# Patient Record
Sex: Female | Born: 1960 | Race: Black or African American | Hispanic: No | Marital: Single | Smoking: Former smoker
Health system: Southern US, Community
[De-identification: ages and names within clinical notes are randomized; demographics above are authoritative.]

## PROBLEM LIST (undated history)

## (undated) DIAGNOSIS — M1990 Unspecified osteoarthritis, unspecified site: Secondary | ICD-10-CM

## (undated) DIAGNOSIS — J40 Bronchitis, not specified as acute or chronic: Secondary | ICD-10-CM

## (undated) DIAGNOSIS — Z923 Personal history of irradiation: Secondary | ICD-10-CM

## (undated) DIAGNOSIS — G43909 Migraine, unspecified, not intractable, without status migrainosus: Secondary | ICD-10-CM

## (undated) DIAGNOSIS — K219 Gastro-esophageal reflux disease without esophagitis: Secondary | ICD-10-CM

## (undated) DIAGNOSIS — J45909 Unspecified asthma, uncomplicated: Secondary | ICD-10-CM

## (undated) DIAGNOSIS — J449 Chronic obstructive pulmonary disease, unspecified: Secondary | ICD-10-CM

## (undated) HISTORY — PX: KNEE ARTHROPLASTY: SHX992

## (undated) HISTORY — PX: CHOLECYSTECTOMY: SHX55

## (undated) HISTORY — PX: APPENDECTOMY: SHX54

## (undated) HISTORY — DX: Bronchitis, not specified as acute or chronic: J40

## (undated) HISTORY — DX: Personal history of irradiation: Z92.3

## (undated) HISTORY — DX: Chronic obstructive pulmonary disease, unspecified: J44.9

---

## 1985-04-14 HISTORY — PX: TUBAL LIGATION: SHX77

## 2001-03-05 ENCOUNTER — Emergency Department (HOSPITAL_COMMUNITY): Admission: EM | Admit: 2001-03-05 | Discharge: 2001-03-05 | Payer: Self-pay | Admitting: *Deleted

## 2003-06-06 ENCOUNTER — Emergency Department (HOSPITAL_COMMUNITY): Admission: EM | Admit: 2003-06-06 | Discharge: 2003-06-06 | Payer: Self-pay | Admitting: *Deleted

## 2003-06-18 ENCOUNTER — Emergency Department (HOSPITAL_COMMUNITY): Admission: EM | Admit: 2003-06-18 | Discharge: 2003-06-18 | Payer: Self-pay | Admitting: Emergency Medicine

## 2005-01-10 ENCOUNTER — Emergency Department (HOSPITAL_COMMUNITY): Admission: EM | Admit: 2005-01-10 | Discharge: 2005-01-11 | Payer: Self-pay | Admitting: Emergency Medicine

## 2005-01-17 ENCOUNTER — Emergency Department (HOSPITAL_COMMUNITY): Admission: EM | Admit: 2005-01-17 | Discharge: 2005-01-17 | Payer: Self-pay | Admitting: Emergency Medicine

## 2007-11-06 ENCOUNTER — Emergency Department (HOSPITAL_COMMUNITY): Admission: EM | Admit: 2007-11-06 | Discharge: 2007-11-06 | Payer: Self-pay | Admitting: Emergency Medicine

## 2008-11-20 ENCOUNTER — Emergency Department (HOSPITAL_COMMUNITY): Admission: EM | Admit: 2008-11-20 | Discharge: 2008-11-20 | Payer: Self-pay | Admitting: Emergency Medicine

## 2009-05-01 ENCOUNTER — Emergency Department (HOSPITAL_COMMUNITY): Admission: EM | Admit: 2009-05-01 | Discharge: 2009-05-01 | Payer: Self-pay | Admitting: Emergency Medicine

## 2009-11-03 ENCOUNTER — Emergency Department (HOSPITAL_COMMUNITY): Admission: EM | Admit: 2009-11-03 | Discharge: 2009-11-03 | Payer: Self-pay | Admitting: Emergency Medicine

## 2010-02-05 ENCOUNTER — Ambulatory Visit (HOSPITAL_COMMUNITY): Admission: RE | Admit: 2010-02-05 | Discharge: 2010-02-05 | Payer: Self-pay | Admitting: Family Medicine

## 2010-09-03 ENCOUNTER — Emergency Department (HOSPITAL_COMMUNITY): Payer: Self-pay

## 2010-09-03 ENCOUNTER — Emergency Department (HOSPITAL_COMMUNITY)
Admission: EM | Admit: 2010-09-03 | Discharge: 2010-09-03 | Disposition: A | Discharge status: Home / Self Care | Payer: Self-pay | Attending: Emergency Medicine | Admitting: Emergency Medicine

## 2010-09-03 DIAGNOSIS — R079 Chest pain, unspecified: Secondary | ICD-10-CM | POA: Insufficient documentation

## 2010-09-03 DIAGNOSIS — J40 Bronchitis, not specified as acute or chronic: Secondary | ICD-10-CM | POA: Insufficient documentation

## 2010-09-03 DIAGNOSIS — J329 Chronic sinusitis, unspecified: Secondary | ICD-10-CM | POA: Insufficient documentation

## 2010-10-14 ENCOUNTER — Emergency Department (HOSPITAL_COMMUNITY)
Admission: EM | Admit: 2010-10-14 | Discharge: 2010-10-14 | Disposition: A | Discharge status: Home / Self Care | Payer: Self-pay | Attending: Emergency Medicine | Admitting: Emergency Medicine

## 2010-10-14 DIAGNOSIS — R300 Dysuria: Secondary | ICD-10-CM | POA: Insufficient documentation

## 2010-10-14 DIAGNOSIS — R110 Nausea: Secondary | ICD-10-CM | POA: Insufficient documentation

## 2010-10-14 DIAGNOSIS — R1032 Left lower quadrant pain: Secondary | ICD-10-CM | POA: Insufficient documentation

## 2010-10-14 DIAGNOSIS — N390 Urinary tract infection, site not specified: Secondary | ICD-10-CM | POA: Insufficient documentation

## 2010-10-14 LAB — BASIC METABOLIC PANEL
BUN: 12 mg/dL (ref 6–23)
CO2: 28 mEq/L (ref 19–32)
Calcium: 9 mg/dL (ref 8.4–10.5)
Chloride: 106 mEq/L (ref 96–112)
Creatinine, Ser: 0.65 mg/dL (ref 0.50–1.10)
GFR calc Af Amer: >60 mL/min (ref >60)
GFR calc non Af Amer: >60 mL/min (ref >60)
Glucose, Bld: 76 mg/dL (ref 70–99)
Potassium: 3.4 mEq/L — ABNORMAL LOW (ref 3.5–5.1)
Sodium: 141 mEq/L (ref 135–145)

## 2010-10-14 LAB — URINALYSIS, ROUTINE W REFLEX MICROSCOPIC
Bilirubin Urine: NEGATIVE
Glucose, UA: NEGATIVE mg/dL
Hgb urine dipstick: NEGATIVE
Ketones, ur: NEGATIVE mg/dL
Nitrite: NEGATIVE
Protein, ur: NEGATIVE mg/dL
Specific Gravity, Urine: >1.03 — ABNORMAL HIGH (ref 1.005–1.030)
Urobilinogen, UA: 0.2 mg/dL (ref 0.0–1.0)
pH: 5.5 (ref 5.0–8.0)

## 2010-10-14 LAB — DIFFERENTIAL
Basophils Absolute: 0 10*3/uL (ref 0.0–0.1)
Basophils Relative: 0 % (ref 0–1)
Eosinophils Absolute: 0.1 10*3/uL (ref 0.0–0.7)
Eosinophils Relative: 1 % (ref 0–5)
Lymphocytes Relative: 30 % (ref 12–46)
Lymphs Abs: 2.9 10*3/uL (ref 0.7–4.0)
Monocytes Absolute: 0.6 10*3/uL (ref 0.1–1.0)
Monocytes Relative: 7 % (ref 3–12)
Neutro Abs: 6.2 10*3/uL (ref 1.7–7.7)
Neutrophils Relative %: 63 % (ref 43–77)

## 2010-10-14 LAB — CBC
HCT: 39 % (ref 36.0–46.0)
Hemoglobin: 13.1 g/dL (ref 12.0–15.0)
MCH: 29.4 pg (ref 26.0–34.0)
MCHC: 33.6 g/dL (ref 30.0–36.0)
MCV: 87.6 fL (ref 78.0–100.0)
Platelets: 210 10*3/uL (ref 150–400)
RBC: 4.45 MIL/uL (ref 3.87–5.11)
RDW: 14.5 % (ref 11.5–15.5)
WBC: 9.9 10*3/uL (ref 4.0–10.5)

## 2010-10-14 LAB — URINE MICROSCOPIC-ADD ON

## 2010-10-16 LAB — URINE CULTURE
Colony Count: 80000
Culture  Setup Time: 201207032015

## 2011-07-12 ENCOUNTER — Emergency Department (HOSPITAL_COMMUNITY)
Admission: EM | Admit: 2011-07-12 | Discharge: 2011-07-12 | Disposition: A | Discharge status: Home / Self Care | Payer: Self-pay | Attending: Emergency Medicine | Admitting: Emergency Medicine

## 2011-07-12 ENCOUNTER — Emergency Department (HOSPITAL_COMMUNITY): Payer: Self-pay

## 2011-07-12 ENCOUNTER — Encounter (HOSPITAL_COMMUNITY): Payer: Self-pay | Admitting: *Deleted

## 2011-07-12 DIAGNOSIS — Z96659 Presence of unspecified artificial knee joint: Secondary | ICD-10-CM | POA: Insufficient documentation

## 2011-07-12 DIAGNOSIS — F17200 Nicotine dependence, unspecified, uncomplicated: Secondary | ICD-10-CM | POA: Insufficient documentation

## 2011-07-12 DIAGNOSIS — M1990 Unspecified osteoarthritis, unspecified site: Secondary | ICD-10-CM

## 2011-07-12 DIAGNOSIS — M1710 Unilateral primary osteoarthritis, unspecified knee: Secondary | ICD-10-CM | POA: Insufficient documentation

## 2011-07-12 DIAGNOSIS — M25569 Pain in unspecified knee: Secondary | ICD-10-CM | POA: Insufficient documentation

## 2011-07-12 MED ORDER — NAPROXEN 500 MG PO TABS: 500.0000 mg | ORAL_TABLET | Freq: Two times a day (BID) | ORAL | Status: AC

## 2011-07-12 MED ORDER — HYDROCODONE-ACETAMINOPHEN 5-500 MG PO TABS: 1.0000 | ORAL_TABLET | Freq: Four times a day (QID) | ORAL | Status: AC | PRN

## 2011-07-12 NOTE — ED Notes (Signed)
Pt c/o pain in bilateral knees and lower back x 3 weeks. Denies injury.

## 2011-07-12 NOTE — Discharge Instructions (Signed)
Osteoarthritis Osteoarthritis is the most common form of arthritis. It is redness, soreness, and swelling (inflammation) affecting the cartilage. Cartilage acts as a cushion, covering the ends of bones where they meet to form a joint. CAUSES  Over time, the cartilage begins to wear away. This causes bone to rub on bone. This produces pain and stiffness in the affected joints. Factors that contribute to this problem are:  Excessive body weight.   Age.   Overuse of joints.  SYMPTOMS   People with osteoarthritis usually experience joint pain, swelling, or stiffness.   Over time, the joint may lose its normal shape.   Small deposits of bone (osteophytes) may grow on the edges of the joint.   Bits of bone or cartilage can break off and float inside the joint space. This may cause more pain and damage.   Osteoarthritis can lead to depression, anxiety, feelings of helplessness, and limitations on daily activities.  The most commonly affected joints are in the:  Ends of the fingers.   Thumbs.   Neck.   Lower back.   Knees.   Hips.  DIAGNOSIS  Diagnosis is mostly based on your symptoms and exam. Tests may be helpful, including:  X-rays of the affected joint.   A computerized magnetic scan (MRI).   Blood tests to rule out other types of arthritis.   Joint fluid tests. This involves using a needle to draw fluid from the joint and examining the fluid under a microscope.  TREATMENT  Goals of treatment are to control pain, improve joint function, maintain a normal body weight, and maintain a healthy lifestyle. Treatment approaches may include:  A prescribed exercise program with rest and joint relief.   Weight control with nutritional education.   Pain relief techniques such as:   Properly applied heat and cold.   Electric pulses delivered to nerve endings under the skin (transcutaneous electrical nerve stimulation, TENS).   Massage.   Certain supplements. Ask your  caregiver before using any supplements, especially in combination with prescribed drugs.   Medicines to control pain, such as:   Acetaminophen.   Nonsteroidal anti-inflammatory drugs (NSAIDs), such as naproxen.   Narcotic or central-acting agents, such as tramadol. This drug carries a risk of addiction and is generally prescribed for short-term use.   Corticosteroids. These can be given orally or as injection. This is a short-term treatment, not recommended for routine use.   Surgery to reposition the bones and relieve pain (osteotomy) or to remove loose pieces of bone and cartilage. Joint replacement may be needed in advanced states of osteoarthritis.  HOME CARE INSTRUCTIONS  Your caregiver can recommend specific types of exercise. These may include:  Strengthening exercises. These are done to strengthen the muscles that support joints affected by arthritis. They can be performed with weights or with exercise bands to add resistance.   Aerobic activities. These are exercises, such as brisk walking or low-impact aerobics, that get your heart pumping. They can help keep your lungs and circulatory system in shape.   Range-of-motion activities. These keep your joints limber.   Balance and agility exercises. These help you maintain daily living skills.  Learning about your condition and being actively involved in your care will help improve the course of your osteoarthritis. SEEK MEDICAL CARE IF:   You feel hot or your skin turns red.   You develop a rash in addition to your joint pain.   You have an oral temperature above 102 F (38.9 C).  FOR   MORE INFORMATION  National Institute of Arthritis and Musculoskeletal and Skin Diseases: www.niams.nih.gov National Institute on Aging: www.nia.nih.gov American College of Rheumatology: www.rheumatology.org Document Released: 03/31/2005 Document Revised: 03/20/2011 Document Reviewed: 07/12/2009 [employer] Patient Information 2012 [employer]. 

## 2011-07-12 NOTE — ED Provider Notes (Signed)
History   This chart was scribed for Geoffery Lyons, MD by Melba Coon. The patient was seen in room APFT23/APFT23 and the patient's care was started at 11:00AM.    CSN: [id number]  Arrival date & time 07/12/11  1041   First MD Initiated Contact with Patient 07/12/11 1055      Chief Complaint  Patient presents with  . Back Pain    (Consider location/radiation/quality/duration/timing/severity/associated sxs/prior treatment) HPI Michele Romero is a 51 y.o. female who presents to the Emergency Department complaining of constant, radiating, moderate to severe bilateral knee pain with an onset 3 weeks ago. Pain radiates to the lower back. Pt has hd chronic left knee problems and had arthroscopic surgery on left knee about 10 years ago, but knee is still giving her trouble. Pt states that the right knee just started to hurt around onset. Knees have been "popping and clicking". No HA, fever, neck pain, CP, SOB, abd pain, or extremity weakness, numbness, or tingling. No known allergies. No other pertinent medical problems.  History reviewed. No pertinent past medical history.  Past Surgical History  Procedure Date  . Knee arthroplasty     left knee  . Tubal ligation   . Cholecystectomy   . Appendectomy     History reviewed. No pertinent family history.  History  Substance Use Topics  . Smoking status: Current Everyday Smoker -- 0.5 packs/day    Types: Cigarettes  . Smokeless tobacco: Not on file  . Alcohol Use: No    OB History    Grav Para Term Preterm Abortions TAB SAB Ect Mult Living                  Review of Systems 10 Systems reviewed and all are negative for acute change except as noted in the HPI.   Allergies  Review of patient's allergies indicates no known allergies.  Home Medications   Current Outpatient Rx  Name Route Sig Dispense Refill  . HYDROCODONE-ACETAMINOPHEN 5-500 MG PO TABS Oral Take 1-2 tablets by mouth every 6 (six) hours as needed for pain.  20 tablet 0  . NAPROXEN 500 MG PO TABS Oral Take 1 tablet (500 mg total) by mouth 2 (two) times daily. 30 tablet 0    BP 126/57  Pulse 85  Temp(Src) 97.8 F (36.6 C) (Oral)  Resp 18  Ht 5' 2.5" (1.588 m)  Wt 234 lb (106.142 kg)  BMI 42.12 kg/m2  SpO2 100%  Physical Exam  Nursing note and vitals reviewed. Constitutional: She is oriented to person, place, and time. She appears well-developed and well-nourished.       Awake, alert, nontoxic appearance.  HENT:  Head: Normocephalic and atraumatic.  Eyes: EOM are normal. Pupils are equal, round, and reactive to light. Right eye exhibits no discharge. Left eye exhibits no discharge.  Neck: Normal range of motion. Neck supple.  Cardiovascular: Normal rate and regular rhythm.   No murmur heard. Pulmonary/Chest: Effort normal. She exhibits no tenderness.  Abdominal: Soft. There is no tenderness. There is no rebound.  Musculoskeletal: She exhibits no edema and no tenderness.       Baseline ROM, no obvious new focal weakness. Right knee appear grossly nml with no effusion with a stable anteior and posterior drawer test; no laxity of the MCL or LCL  Neurological: She is alert and oriented to person, place, and time.       Mental status and motor strength appears baseline for patient and situation.  Skin: Skin  is warm. No rash noted.  Psychiatric: She has a normal mood and affect. Her behavior is normal.    ED Course  Procedures (including critical care time)  DIAGNOSTIC STUDIES: Oxygen Saturation is 100% on room air, normal by my interpretation.    COORDINATION OF CARE:  11:05AM - EDMD will order right knee XR for the pt. 11:50AM - recheck; EDMD has reviewed imaging results c/w osteoarthritis. EDMD advises the pt to f/u with PCP/ED if pain continues; plans for d/c  Labs Reviewed - No data to display Dg Knee Complete 4 Views Right  07/12/2011  *RADIOLOGY REPORT*  Clinical Data: Knee pain and crepitus  RIGHT KNEE - COMPLETE 4+ VIEW   Comparison: None.  Findings: There is medial joint compartment narrowing and subchondral sclerosis.  Patellofemoral joint compartment narrowing is also present.  No subchondral lesions are identified.  There is osteophytosis about the knee, most prominent along the medial joint margin.  There is a moderate sized suprapatellar effusion.  No evidence of fracture or dislocation.  IMPRESSION: There is medial joint compartment and patellofemoral joint compartment narrowing and osteophytosis, consistent with osteoarthritis.  No gross subchondral lesions are identified.  Moderate sized suprapatellar effusion.  Original Report Authenticated By: Brandon Melnick, M.D.     1. Knee pain   2. Osteoarthritis       MDM  Will discharge with nsaids, lortab.  Follow up with ortho.  I personally performed the services described in this documentation, which was scribed in my presence. The recorded information has been reviewed and considered.         Geoffery Lyons, MD 07/12/11 1524

## 2012-03-16 ENCOUNTER — Other Ambulatory Visit (HOSPITAL_COMMUNITY): Payer: Self-pay | Admitting: Nurse Practitioner

## 2012-03-16 DIAGNOSIS — Z139 Encounter for screening, unspecified: Secondary | ICD-10-CM

## 2012-03-25 ENCOUNTER — Ambulatory Visit (HOSPITAL_COMMUNITY)
Admission: RE | Admit: 2012-03-25 | Discharge: 2012-03-25 | Disposition: A | Discharge status: Home / Self Care | Payer: Self-pay | Source: Ambulatory Visit | Attending: Nurse Practitioner | Admitting: Nurse Practitioner

## 2012-03-25 ENCOUNTER — Ambulatory Visit (HOSPITAL_COMMUNITY): Payer: Self-pay

## 2012-03-25 DIAGNOSIS — Z139 Encounter for screening, unspecified: Secondary | ICD-10-CM

## 2012-10-17 ENCOUNTER — Emergency Department (HOSPITAL_COMMUNITY): Payer: Self-pay

## 2012-10-17 ENCOUNTER — Encounter (HOSPITAL_COMMUNITY): Payer: Self-pay | Admitting: Emergency Medicine

## 2012-10-17 ENCOUNTER — Emergency Department (HOSPITAL_COMMUNITY)
Admission: EM | Admit: 2012-10-17 | Discharge: 2012-10-17 | Disposition: A | Discharge status: Home / Self Care | Payer: Self-pay | Attending: Emergency Medicine | Admitting: Emergency Medicine

## 2012-10-17 DIAGNOSIS — F17200 Nicotine dependence, unspecified, uncomplicated: Secondary | ICD-10-CM | POA: Insufficient documentation

## 2012-10-17 DIAGNOSIS — S20229A Contusion of unspecified back wall of thorax, initial encounter: Secondary | ICD-10-CM | POA: Insufficient documentation

## 2012-10-17 DIAGNOSIS — Y929 Unspecified place or not applicable: Secondary | ICD-10-CM | POA: Insufficient documentation

## 2012-10-17 DIAGNOSIS — Y939 Activity, unspecified: Secondary | ICD-10-CM | POA: Insufficient documentation

## 2012-10-17 DIAGNOSIS — W010XXA Fall on same level from slipping, tripping and stumbling without subsequent striking against object, initial encounter: Secondary | ICD-10-CM | POA: Insufficient documentation

## 2012-10-17 DIAGNOSIS — S300XXA Contusion of lower back and pelvis, initial encounter: Secondary | ICD-10-CM

## 2012-10-17 MED ORDER — KETOROLAC TROMETHAMINE 60 MG/2ML IM SOLN
60.0000 mg | Freq: Once | INTRAMUSCULAR | Status: AC
  Administered 2012-10-17: 60 mg via INTRAMUSCULAR
  Filled 2012-10-17: qty 2

## 2012-10-17 MED ORDER — NAPROXEN 500 MG PO TABS: 500.0000 mg | ORAL_TABLET | Freq: Two times a day (BID) | ORAL | Status: DC

## 2012-10-17 MED ORDER — DIAZEPAM 5 MG PO TABS
5.0000 mg | ORAL_TABLET | Freq: Once | ORAL | Status: AC
  Administered 2012-10-17: 5 mg via ORAL
  Filled 2012-10-17: qty 1

## 2012-10-17 MED ORDER — HYDROCODONE-ACETAMINOPHEN 5-325 MG PO TABS: ORAL_TABLET | ORAL | Status: DC

## 2012-10-17 MED ORDER — CYCLOBENZAPRINE HCL 10 MG PO TABS: 10.0000 mg | ORAL_TABLET | Freq: Three times a day (TID) | ORAL | Status: DC | PRN

## 2012-10-17 NOTE — ED Provider Notes (Signed)
History    CSN: [id number] Arrival date & time 10/17/12  1507  First MD Initiated Contact with Patient 10/17/12 1558     Chief Complaint  Patient presents with  . Back Pain   (Consider location/radiation/quality/duration/timing/severity/associated sxs/prior Treatment) Patient is a 52 y.o. female presenting with back pain. The history is provided by the patient.  Back Pain Location:  Lumbar spine Quality:  Aching and shooting Radiates to:  R thigh, R knee and R foot Pain severity:  Moderate Pain is:  Same all the time Onset quality:  Sudden Duration:  2 days Timing:  Constant Progression:  Unchanged Chronicity:  New Context: falling and recent injury   Context: not twisting   Relieved by:  Nothing Worsened by:  Bending, ambulation, twisting, standing and movement Ineffective treatments:  OTC medications Associated symptoms: leg pain   Associated symptoms: no abdominal pain, no abdominal swelling, no bladder incontinence, no bowel incontinence, no chest pain, no dysuria, no fever, no headaches, no numbness, no paresthesias, no pelvic pain, no perianal numbness, no tingling and no weakness    History reviewed. No pertinent past medical history. Past Surgical History  Procedure Laterality Date  . Knee arthroplasty      left knee  . Tubal ligation    . Cholecystectomy    . Appendectomy     History reviewed. No pertinent family history. History  Substance Use Topics  . Smoking status: Current Every Day Smoker -- 0.50 packs/day    Types: Cigarettes  . Smokeless tobacco: Not on file  . Alcohol Use: No   OB History   Grav Para Term Preterm Abortions TAB SAB Ect Mult Living                 Review of Systems  Constitutional: Negative for fever.  Respiratory: Negative for shortness of breath.   Cardiovascular: Negative for chest pain.  Gastrointestinal: Negative for nausea, vomiting, abdominal pain, diarrhea, constipation and bowel incontinence.  Genitourinary:  Negative for bladder incontinence, dysuria, hematuria, flank pain, decreased urine volume, difficulty urinating and pelvic pain.       No perineal numbness or incontinence of urine or feces  Musculoskeletal: Positive for back pain. Negative for joint swelling.  Skin: Negative for rash.  Neurological: Negative for dizziness, tingling, weakness, numbness, headaches and paresthesias.  All other systems reviewed and are negative.    Allergies  Codeine  Home Medications  No current outpatient prescriptions on file. BP 112/70  Pulse 96  Temp(Src) 98.7 F (37.1 C) (Oral)  Resp 20  Ht 5\' 2"  (1.575 m)  Wt 232 lb (105.235 kg)  BMI 42.42 kg/m2  SpO2 100% Physical Exam  Nursing note and vitals reviewed. Constitutional: She is oriented to person, place, and time. She appears well-developed and well-nourished. No distress.  HENT:  Head: Normocephalic and atraumatic.  Neck: Normal range of motion. Neck supple.  Cardiovascular: Normal rate, regular rhythm, normal heart sounds and intact distal pulses.   No murmur heard. Pulmonary/Chest: Effort normal and breath sounds normal. No respiratory distress. She exhibits no tenderness.  Abdominal: Soft. She exhibits no distension. There is no tenderness. There is no rebound and no guarding.  Musculoskeletal: She exhibits tenderness. She exhibits no edema.       Lumbar back: She exhibits tenderness and pain. She exhibits normal range of motion, no swelling, no deformity, no laceration and normal pulse.  ttp of the right lumbar spine and paraspinal muscles.    DP pulses are brisk and symmetrical.  Distal sensation intact.  Hip Flexors/Extensors are intact  Neurological: She is alert and oriented to person, place, and time. No cranial nerve deficit or sensory deficit. She exhibits normal muscle tone. Coordination and gait normal.  Reflex Scores:      Patellar reflexes are 2+ on the right side and 2+ on the left side.      Achilles reflexes are 2+ on the  right side and 2+ on the left side. Skin: Skin is warm and dry.    ED Course  Procedures (including critical care time) Labs Reviewed - No data to display Dg Lumbar Spine Complete  10/17/2012   *RADIOLOGY REPORT*  Clinical Data: Back pain  LUMBAR SPINE - COMPLETE 4+ VIEW  Comparison: None  Findings: There is a mild anterolisthesis of L4 on L5.  Mild multilevel disc space narrowing and ventral endplate spurring is noted.  Facet hypertrophy and degenerative changes noted within the lower lumbar spine.  No fractures or dislocations.  IMPRESSION:  1.  No acute findings. 2.  Lumbar spondylosis.   Original Report Authenticated By: Signa Kell, M.D.     MDM    Patient has ttp of the right lumbar spine and paraspinal muscles.  No focal neuro deficits on exam.  Ambulates with a steady gait.   X-ray negative for acute injury.  No abrasions, edema or bruising to the lumbar region.  Doubt emergent neurological or infectious process.  Pt agrees to ice, heat and close f/u with health dept if needed.    Aris Moman L. Trisha Mangle, PA-C 10/17/12 1716

## 2012-10-17 NOTE — ED Notes (Signed)
Tripped and fell July 4th in tub and c/o lower back pain since. otc meds with no relief. Pain radiates down right leg. Nad.

## 2012-10-17 NOTE — ED Provider Notes (Signed)
Medical screening examination/treatment/procedure(s) were performed by non-physician practitioner and as supervising physician I was immediately available for consultation/collaboration.  Shelda Jakes, MD 10/17/12 [phone]

## 2013-02-18 ENCOUNTER — Other Ambulatory Visit (HOSPITAL_COMMUNITY): Payer: Self-pay | Admitting: *Deleted

## 2013-02-18 DIAGNOSIS — Z139 Encounter for screening, unspecified: Secondary | ICD-10-CM

## 2013-03-28 ENCOUNTER — Ambulatory Visit (HOSPITAL_COMMUNITY): Payer: Self-pay

## 2013-04-18 ENCOUNTER — Ambulatory Visit (HOSPITAL_COMMUNITY)
Admission: RE | Admit: 2013-04-18 | Discharge: 2013-04-18 | Disposition: A | Discharge status: Home / Self Care | Payer: PRIVATE HEALTH INSURANCE | Source: Ambulatory Visit | Attending: *Deleted | Admitting: *Deleted

## 2013-04-18 DIAGNOSIS — Z139 Encounter for screening, unspecified: Secondary | ICD-10-CM

## 2013-04-18 DIAGNOSIS — Z1231 Encounter for screening mammogram for malignant neoplasm of breast: Secondary | ICD-10-CM | POA: Insufficient documentation

## 2013-06-23 ENCOUNTER — Emergency Department (HOSPITAL_COMMUNITY)
Admission: EM | Admit: 2013-06-23 | Discharge: 2013-06-23 | Discharge status: Against Medical Advice | Payer: 59 | Attending: Emergency Medicine | Admitting: Emergency Medicine

## 2013-06-23 ENCOUNTER — Encounter (HOSPITAL_COMMUNITY): Payer: Self-pay | Admitting: Emergency Medicine

## 2013-06-23 DIAGNOSIS — F17200 Nicotine dependence, unspecified, uncomplicated: Secondary | ICD-10-CM | POA: Insufficient documentation

## 2013-06-23 DIAGNOSIS — R109 Unspecified abdominal pain: Secondary | ICD-10-CM | POA: Insufficient documentation

## 2013-06-23 DIAGNOSIS — R197 Diarrhea, unspecified: Secondary | ICD-10-CM | POA: Insufficient documentation

## 2013-06-23 DIAGNOSIS — G971 Other reaction to spinal and lumbar puncture: Secondary | ICD-10-CM | POA: Insufficient documentation

## 2013-06-23 HISTORY — DX: Migraine, unspecified, not intractable, without status migrainosus: G43.909

## 2013-06-23 LAB — COMPREHENSIVE METABOLIC PANEL
ALK PHOS: 74 U/L (ref 39–117)
ALT: 17 U/L (ref 0–35)
AST: 21 U/L (ref 0–37)
Albumin: 3.7 g/dL (ref 3.5–5.2)
BUN: 11 mg/dL (ref 6–23)
CO2: 29 mEq/L (ref 19–32)
CREATININE: 0.77 mg/dL (ref 0.50–1.10)
Calcium: 9.2 mg/dL (ref 8.4–10.5)
Chloride: 103 mEq/L (ref 96–112)
GFR calc non Af Amer: >90 mL/min (ref >90)
Glucose, Bld: 126 mg/dL — ABNORMAL HIGH (ref 70–99)
POTASSIUM: 4.8 meq/L (ref 3.7–5.3)
Sodium: 142 mEq/L (ref 137–147)
Total Bilirubin: 0.2 mg/dL — ABNORMAL LOW (ref 0.3–1.2)
Total Protein: 7.8 g/dL (ref 6.0–8.3)

## 2013-06-23 LAB — CBC WITH DIFFERENTIAL/PLATELET
Basophils Absolute: 0 10*3/uL (ref 0.0–0.1)
Basophils Relative: 0 % (ref 0–1)
Eosinophils Absolute: 0 10*3/uL (ref 0.0–0.7)
Eosinophils Relative: 0 % (ref 0–5)
HCT: 39.7 % (ref 36.0–46.0)
HEMOGLOBIN: 13.5 g/dL (ref 12.0–15.0)
Lymphocytes Relative: 16 % (ref 12–46)
Lymphs Abs: 1.5 10*3/uL (ref 0.7–4.0)
MCH: 30.3 pg (ref 26.0–34.0)
MCHC: 34 g/dL (ref 30.0–36.0)
MCV: 89 fL (ref 78.0–100.0)
Monocytes Absolute: 0.4 10*3/uL (ref 0.1–1.0)
Monocytes Relative: 5 % (ref 3–12)
Neutro Abs: 7.5 10*3/uL (ref 1.7–7.7)
Neutrophils Relative %: 79 % — ABNORMAL HIGH (ref 43–77)
Platelets: 215 10*3/uL (ref 150–400)
RBC: 4.46 MIL/uL (ref 3.87–5.11)
RDW: 14.9 % (ref 11.5–15.5)
WBC: 9.5 10*3/uL (ref 4.0–10.5)

## 2013-06-23 NOTE — ED Notes (Signed)
Patient c/o mid abd pain with nausea and diarrhea. Denies any diarrhea or urinary symptoms. Patient unsure of any fevers but states "I have had a cold rag on my head all day because I felt hot." Patient also c/o migraine headache x4 days. Per patient hx of migraines. Patient reports using Excedrin migraine with no relief. Patient reports some sensitivity to light and sound.

## 2013-06-23 NOTE — ED Notes (Signed)
Called pt in all waiting areas x 3, no answer

## 2013-06-23 NOTE — ED Provider Notes (Signed)
7:25 PM. Patient not in room on attempted evaluation  Ezequiel Essex, MD 06/23/13 1927

## 2013-09-15 ENCOUNTER — Emergency Department (HOSPITAL_COMMUNITY)
Admission: EM | Admit: 2013-09-15 | Discharge: 2013-09-15 | Disposition: A | Discharge status: Home / Self Care | Payer: 59 | Attending: Emergency Medicine | Admitting: Emergency Medicine

## 2013-09-15 ENCOUNTER — Encounter (HOSPITAL_COMMUNITY): Payer: Self-pay | Admitting: Emergency Medicine

## 2013-09-15 DIAGNOSIS — Z8679 Personal history of other diseases of the circulatory system: Secondary | ICD-10-CM | POA: Insufficient documentation

## 2013-09-15 DIAGNOSIS — F17200 Nicotine dependence, unspecified, uncomplicated: Secondary | ICD-10-CM | POA: Insufficient documentation

## 2013-09-15 DIAGNOSIS — G8929 Other chronic pain: Secondary | ICD-10-CM | POA: Insufficient documentation

## 2013-09-15 DIAGNOSIS — Z791 Long term (current) use of non-steroidal anti-inflammatories (NSAID): Secondary | ICD-10-CM | POA: Insufficient documentation

## 2013-09-15 DIAGNOSIS — M25561 Pain in right knee: Secondary | ICD-10-CM

## 2013-09-15 DIAGNOSIS — Z96659 Presence of unspecified artificial knee joint: Secondary | ICD-10-CM | POA: Insufficient documentation

## 2013-09-15 DIAGNOSIS — R52 Pain, unspecified: Secondary | ICD-10-CM | POA: Insufficient documentation

## 2013-09-15 DIAGNOSIS — M25569 Pain in unspecified knee: Secondary | ICD-10-CM | POA: Insufficient documentation

## 2013-09-15 MED ORDER — NAPROXEN 500 MG PO TABS: 500.0000 mg | ORAL_TABLET | Freq: Two times a day (BID) | ORAL | Status: DC

## 2013-09-15 MED ORDER — HYDROCODONE-ACETAMINOPHEN 5-325 MG PO TABS: ORAL_TABLET | ORAL | Status: DC

## 2013-09-15 NOTE — ED Provider Notes (Signed)
CSN: [id number]     Arrival date & time 09/15/13  1348 History   First MD Initiated Contact with Patient 09/15/13 1443     Chief Complaint  Patient presents with  . Leg Pain     (Consider location/radiation/quality/duration/timing/severity/associated sxs/prior Treatment) Patient is a 53 y.o. female presenting with knee pain.  Knee Pain Location:  Knee Injury: no   Knee location:  R knee Pain details:    Quality:  Aching and shooting   Radiates to:  Does not radiate   Severity:  Moderate   Onset quality:  Gradual   Timing:  Intermittent Chronicity:  Chronic Dislocation: no   Foreign body present:  No foreign bodies Prior injury to area:  No (recurrent right knee pain) Relieved by:  Rest Worsened by:  Activity, bearing weight and flexion Ineffective treatments:  Acetaminophen Associated symptoms: no back pain, no decreased ROM, no fatigue, no fever, no itching, no muscle weakness, no neck pain, no numbness, no stiffness, no swelling and no tingling     Past Medical History  Diagnosis Date  . Migraines    Past Surgical History  Procedure Laterality Date  . Knee arthroplasty      left knee  . Tubal ligation    . Cholecystectomy    . Appendectomy     Family History  Problem Relation Age of Onset  . Diabetes Mother   . Hypertension Mother    History  Substance Use Topics  . Smoking status: Current Every Day Smoker -- 0.50 packs/day for 32 years    Types: Cigarettes  . Smokeless tobacco: Never Used  . Alcohol Use: No   OB History   Grav Para Term Preterm Abortions TAB SAB Ect Mult Living   3 2 2  1  1   2      Review of Systems  Constitutional: Negative for fever, chills and fatigue.  Genitourinary: Negative for dysuria and difficulty urinating.  Musculoskeletal: Positive for arthralgias. Negative for back pain, joint swelling, neck pain and stiffness.  Skin: Negative for color change, itching and wound.  All other systems reviewed and are  negative.     Allergies  Codeine  Home Medications   Prior to Admission medications   Medication Sig Start Date End Date Taking? Authorizing Provider  HYDROcodone-acetaminophen (NORCO/VICODIN) 5-325 MG per tablet Take one-two tabs po q 4-6 hrs prn pain 09/15/13   Corrie Brannen L. Starkisha Tullis, PA-C  naproxen (NAPROSYN) 500 MG tablet Take 1 tablet (500 mg total) by mouth 2 (two) times daily. 09/15/13   Emeterio Balke L. Marquita Lias, PA-C   BP 128/83  Pulse 82  Temp(Src) 98.3 F (36.8 C) (Oral)  Resp 16  Ht 5' 2.5" (1.588 m)  Wt 243 lb 6.4 oz (110.406 kg)  BMI 43.78 kg/m2  SpO2 97% Physical Exam  Nursing note and vitals reviewed. Constitutional: She is oriented to person, place, and time. She appears well-developed and well-nourished. No distress.  Cardiovascular: Normal rate, regular rhythm, normal heart sounds and intact distal pulses.   No murmur heard. Pulmonary/Chest: Effort normal and breath sounds normal. She exhibits no tenderness.  Musculoskeletal: She exhibits tenderness.  Diffuse ttp of the anterior and medial right knee.  No erythema, effusion, or step-off deformity.  DP pulse brisk, distal sensation intact. Calf is soft and NT. Compartments of the right leg are soft.   Neurological: She is alert and oriented to person, place, and time. She exhibits normal muscle tone. Coordination normal.  Skin: Skin is warm and dry. No  erythema.    ED Course  Procedures (including critical care time) Labs Review Labs Reviewed - No data to display  Imaging Review No results found.   EKG Interpretation None      MDM   Final diagnoses:  Knee pain, right   Pt well appearing.  Non-toxic.  No concerning sx's for DVT or septic joint.  Compartments of the right LE are soft. Right knee pain is acute on chronic  Pt had imaging of the right knee in March of 2013 that showed OA and compartment narrowing.  Pt has intermittent pain flares of the knee since that time and has not seen orthopedics.  No  concerning sx's for DVT, septic joint or compartment syndrome. I  have advised her of importance of proper f/u and she agrees to plan and verbalized understanding.  She appears stable for d/c, rx's for vicodin and naprosyn.    Maxime Beckner L. Vanessa Reynolds, PA-C 09/16/13 2138

## 2013-09-15 NOTE — Discharge Instructions (Signed)
Knee Pain Knee pain can be a result of an injury or other medical conditions. Treatment will depend on the cause of your pain. HOME CARE  Only take medicine as told by your doctor.  Keep a healthy weight. Being overweight can make the knee hurt more.  Stretch before exercising or playing sports.  If there is constant knee pain, change the way you exercise. Ask your doctor for advice.  Make sure shoes fit well. Choose the right shoe for the sport or activity.  Protect your knees. Wear kneepads if needed.  Rest when you are tired. GET HELP RIGHT AWAY IF:   Your knee pain does not stop.  Your knee pain does not get better.  Your knee joint feels hot to the touch.  You have a fever. MAKE SURE YOU:   Understand these instructions.  Will watch this condition.  Will get help right away if you are not doing well or get worse. Document Released: 06/27/2008 Document Revised: 06/23/2011 Document Reviewed: 06/27/2008 [employer] Patient Information 2014 [employer].  

## 2013-09-15 NOTE — ED Notes (Signed)
Rt leg pain "feels like it will give way"  Limps when walks

## 2013-09-15 NOTE — ED Notes (Signed)
Pt reports for the past 2 weeks has been having pain in r knee and r ankle.  Reports R knee " gives out."  Denies injury.

## 2013-09-17 NOTE — ED Provider Notes (Signed)
Medical screening examination/treatment/procedure(s) were performed by non-physician practitioner and as supervising physician I was immediately available for consultation/collaboration.   EKG Interpretation None       Richarda Blade, MD 09/17/13 [phone]

## 2014-02-13 ENCOUNTER — Encounter (HOSPITAL_COMMUNITY): Payer: Self-pay | Admitting: Emergency Medicine

## 2014-07-04 ENCOUNTER — Other Ambulatory Visit (HOSPITAL_COMMUNITY): Payer: Self-pay | Admitting: *Deleted

## 2014-07-04 DIAGNOSIS — Z1231 Encounter for screening mammogram for malignant neoplasm of breast: Secondary | ICD-10-CM

## 2014-07-17 ENCOUNTER — Ambulatory Visit (HOSPITAL_COMMUNITY)
Admission: RE | Admit: 2014-07-17 | Discharge: 2014-07-17 | Disposition: A | Discharge status: Home / Self Care | Payer: PRIVATE HEALTH INSURANCE | Source: Ambulatory Visit | Attending: *Deleted | Admitting: *Deleted

## 2014-07-17 DIAGNOSIS — Z1231 Encounter for screening mammogram for malignant neoplasm of breast: Secondary | ICD-10-CM | POA: Diagnosis present

## 2014-07-27 ENCOUNTER — Emergency Department (HOSPITAL_COMMUNITY)
Admission: EM | Admit: 2014-07-27 | Discharge: 2014-07-27 | Disposition: A | Discharge status: Home / Self Care | Payer: PRIVATE HEALTH INSURANCE | Attending: Emergency Medicine | Admitting: Emergency Medicine

## 2014-07-27 ENCOUNTER — Encounter (HOSPITAL_COMMUNITY): Payer: Self-pay | Admitting: Emergency Medicine

## 2014-07-27 ENCOUNTER — Emergency Department (HOSPITAL_COMMUNITY): Payer: PRIVATE HEALTH INSURANCE

## 2014-07-27 DIAGNOSIS — Z720 Tobacco use: Secondary | ICD-10-CM | POA: Insufficient documentation

## 2014-07-27 DIAGNOSIS — Z791 Long term (current) use of non-steroidal anti-inflammatories (NSAID): Secondary | ICD-10-CM | POA: Insufficient documentation

## 2014-07-27 DIAGNOSIS — H109 Unspecified conjunctivitis: Secondary | ICD-10-CM | POA: Insufficient documentation

## 2014-07-27 MED ORDER — IBUPROFEN 800 MG PO TABS
800.0000 mg | ORAL_TABLET | Freq: Once | ORAL | Status: AC
  Administered 2014-07-27: 800 mg via ORAL

## 2014-07-27 MED ORDER — IBUPROFEN 800 MG PO TABS
ORAL_TABLET | ORAL | Status: AC
  Filled 2014-07-27: qty 1

## 2014-07-27 NOTE — ED Notes (Signed)
Patient given discharge instruction, verbalized understand. Patient ambulatory out of the department.  

## 2014-07-27 NOTE — ED Notes (Signed)
Right eye very red and sore, MD checking pressure

## 2014-07-27 NOTE — ED Provider Notes (Signed)
CSN: [id number]     Arrival date & time 07/27/14  7858 History  This chart was scribed for Milton Ferguson, MD by Mercy Moore, ED scribe.  This patient was seen in room APA18/[facility]18 and the patient's care was started at 9:21 AM.   Chief Complaint  Patient presents with  . Migraine   Patient is a 54 y.o. female presenting with eye pain. The history is provided by the patient. No language interpreter was used.  Eye Pain This is a new problem. The current episode started more than 2 days ago. The problem has been gradually worsening. Associated symptoms include headaches. Pertinent negatives include no chest pain, no abdominal pain and no shortness of breath. Nothing aggravates the symptoms. Nothing relieves the symptoms. She has tried nothing for the symptoms. The treatment provided no relief.   HPI Comments: Michele Romero is a 54 y.o. female who presents to the Emergency Department complaining of right eye pain and redness, onset three days ago. Patient reports that the following morning she awakened to a right headache and increased redness. Patient reports associated photophobia and exacerbation of pain with flexion of neck and applied pressure; states "it's really sore." Patient denies crusting or discharge.  Patient states that she wears glasses, but her prescription has expired.   Past Medical History  Diagnosis Date  . Migraines    Past Surgical History  Procedure Laterality Date  . Knee arthroplasty      left knee  . Tubal ligation    . Cholecystectomy    . Appendectomy     Family History  Problem Relation Age of Onset  . Diabetes Mother   . Hypertension Mother    History  Substance Use Topics  . Smoking status: Current Every Day Smoker -- 0.50 packs/day for 32 years    Types: Cigarettes  . Smokeless tobacco: Never Used  . Alcohol Use: No   OB History    Gravida Para Term Preterm AB TAB SAB Ectopic Multiple Living   3 2 2  1  1   2      Review of Systems   Constitutional: Negative for appetite change and fatigue.  HENT: Negative for congestion, ear discharge and sinus pressure.   Eyes: Positive for photophobia, pain and redness. Negative for discharge.  Respiratory: Negative for cough and shortness of breath.   Cardiovascular: Negative for chest pain.  Gastrointestinal: Negative for abdominal pain and diarrhea.  Genitourinary: Negative for frequency and hematuria.  Musculoskeletal: Negative for back pain.  Skin: Negative for rash.  Neurological: Positive for headaches. Negative for seizures.  Psychiatric/Behavioral: Negative for hallucinations.      Allergies  Codeine  Home Medications   Prior to Admission medications   Medication Sig Start Date End Date Taking? Authorizing Provider  HYDROcodone-acetaminophen (NORCO/VICODIN) 5-325 MG per tablet Take one-two tabs po q 4-6 hrs prn pain 09/15/13   Tammi Triplett, PA-C  naproxen (NAPROSYN) 500 MG tablet Take 1 tablet (500 mg total) by mouth 2 (two) times daily. 09/15/13   Tammi Triplett, PA-C   Triage Vitals: BP 137/89 mmHg  Pulse 90  Temp(Src) 98.7 F (37.1 C) (Oral)  Resp 16  Ht 5' 2.5" (1.588 m)  Wt 234 lb (106.142 kg)  BMI 42.09 kg/m2  SpO2 99% Physical Exam  Constitutional: She is oriented to person, place, and time. She appears well-developed.  HENT:  Head: Normocephalic.  Eyes: EOM are normal. Pupils are equal, round, and reactive to light. No scleral icterus.  Right conjunctiva  inflamed,  Pressure in right eye 17  Neck: Neck supple. No thyromegaly present.  Cardiovascular: Normal rate and regular rhythm.  Exam reveals no gallop and no friction rub.   No murmur heard. Pulmonary/Chest: No stridor. She has no wheezes. She has no rales. She exhibits no tenderness.  Abdominal: She exhibits no distension. There is no tenderness. There is no rebound.  Musculoskeletal: Normal range of motion. She exhibits no edema.  Lymphadenopathy:    She has no cervical adenopathy.   Neurological: She is oriented to person, place, and time. She exhibits normal muscle tone. Coordination normal.  Skin: No rash noted. No erythema.  Psychiatric: She has a normal mood and affect. Her behavior is normal.    ED Course  Procedures (including critical care time)  COORDINATION OF CARE: 9:28 AM- Discussed treatment plan with patient at bedside and patient agreed to plan.   Labs Review Labs Reviewed - No data to display  Imaging Review No results found.   EKG Interpretation None      MDM   Final diagnoses:  None    Conjunctivitis,  tx with tobrex and follow up tomorrow with eye doctor   Milton Ferguson, MD 07/27/14 1248

## 2014-07-27 NOTE — Discharge Instructions (Signed)
Follow up with Dr. Iona Hansen,  Or follow up with Dr. Creig Hines [phone]  tomorrow

## 2014-07-27 NOTE — ED Notes (Signed)
MD at the bedside  

## 2014-07-27 NOTE — ED Notes (Addendum)
Attempted to complete visual acuity test on patient. Patient states she wears glasses and is unable to see anything on the chart without the use of her glasses except the large E at the top. States the large E on the top is clear with the left eye but blurry with the right eye. Patient does not have her personal glasses to use for visual acuity.

## 2014-07-27 NOTE — ED Notes (Signed)
Pt reports her eye started hurting on Mon, has become red and light sensitive. Pt states the R side of her head hurts.

## 2015-06-21 ENCOUNTER — Encounter: Payer: Self-pay | Admitting: Physician Assistant

## 2015-06-21 ENCOUNTER — Ambulatory Visit: Payer: Self-pay | Admitting: Physician Assistant

## 2015-06-21 VITALS — BP 136/88 | HR 88 | Temp 96.1°F | Ht 61.75 in | Wt 247.5 lb

## 2015-06-21 DIAGNOSIS — M67431 Ganglion, right wrist: Secondary | ICD-10-CM

## 2015-06-21 DIAGNOSIS — M25561 Pain in right knee: Secondary | ICD-10-CM

## 2015-06-21 DIAGNOSIS — Z13220 Encounter for screening for lipoid disorders: Secondary | ICD-10-CM

## 2015-06-21 DIAGNOSIS — Z131 Encounter for screening for diabetes mellitus: Secondary | ICD-10-CM

## 2015-06-21 DIAGNOSIS — Z1239 Encounter for other screening for malignant neoplasm of breast: Secondary | ICD-10-CM

## 2015-06-21 DIAGNOSIS — F17210 Nicotine dependence, cigarettes, uncomplicated: Secondary | ICD-10-CM

## 2015-06-21 LAB — GLUCOSE, POCT (MANUAL RESULT ENTRY): POC Glucose: 83 mg/dl (ref 70–99)

## 2015-06-21 NOTE — Progress Notes (Signed)
BP 136/88 mmHg  Pulse 88  Temp(Src) 96.1 F (35.6 C)  Ht 5' 1.75" (1.568 m)  Wt 247 lb 8 oz (112.265 kg)  BMI 45.66 kg/m2  SpO2 97%   Subjective:    Patient ID: Michele Romero, female    DOB: [birth year], 55 y.o.   MRN: [id number]  HPI: Michele Romero is a 55 y.o. female presenting on 06/21/2015 for New Patient (Initial Visit)   HPI   Pt previously treated at health dept  C/o R knee pain and swelling.  Bother her for over a year.   Xray from 07/12/11 shows OA.    C/o mass R hand since 3 d ago- a litlte sore, not pain.  She doesn't remember bumping it on anything.  C/o nasal congestion x 1 wk.  Some wheezing.    Relevant past medical, surgical, family and social history reviewed and updated as indicated. Interim medical history since our last visit reviewed. Allergies and medications reviewed and updated.  No current outpatient prescriptions on file.   Review of Systems  Constitutional: Positive for diaphoresis. Negative for fever, chills, appetite change, fatigue and unexpected weight change.  HENT: Positive for dental problem, sneezing and sore throat. Negative for congestion, drooling, ear pain, facial swelling, hearing loss, mouth sores, trouble swallowing and voice change.   Eyes: Positive for itching. Negative for pain, discharge, redness and visual disturbance.  Respiratory: Positive for cough and wheezing. Negative for choking and shortness of breath.   Cardiovascular: Positive for leg swelling. Negative for chest pain and palpitations.  Gastrointestinal: Negative for vomiting, abdominal pain, diarrhea, constipation and blood in stool.  Endocrine: Negative for cold intolerance, heat intolerance and polydipsia.  Genitourinary: Negative for dysuria, hematuria and decreased urine volume.  Musculoskeletal: Positive for back pain, arthralgias and gait problem.  Skin: Negative for rash.  Allergic/Immunologic: Positive for environmental allergies.  Neurological:  Negative for seizures, syncope, light-headedness and headaches.  Hematological: Negative for adenopathy.  Psychiatric/Behavioral: Negative for suicidal ideas, dysphoric mood and agitation. The patient is not nervous/anxious.     Per HPI unless specifically indicated above     Objective:    BP 136/88 mmHg  Pulse 88  Temp(Src) 96.1 F (35.6 C)  Ht 5' 1.75" (1.568 m)  Wt 247 lb 8 oz (112.265 kg)  BMI 45.66 kg/m2  SpO2 97%  Wt Readings from Last 3 Encounters:  06/21/15 247 lb 8 oz (112.265 kg)  07/27/14 234 lb (106.142 kg)  09/15/13 243 lb 6.4 oz (110.406 kg)    Physical Exam  Constitutional: She is oriented to person, place, and time. She appears well-developed and well-nourished.  HENT:  Head: Normocephalic and atraumatic.  Mouth/Throat: Oropharynx is clear and moist. No oropharyngeal exudate.  Eyes: Conjunctivae and EOM are normal. Pupils are equal, round, and reactive to light.  Neck: Neck supple. No thyromegaly present.  Cardiovascular: Normal rate and regular rhythm.   Pulmonary/Chest: Effort normal and breath sounds normal.  Abdominal: Soft. Bowel sounds are normal. She exhibits no mass. There is no hepatosplenomegaly. There is no tenderness.  Musculoskeletal: She exhibits no edema.       Right wrist: She exhibits normal range of motion, no tenderness and no bony tenderness.       Right knee: She exhibits no swelling and no effusion. Tenderness found.  Crepitus R knee Small cyst c/w ganglion on dorsal surface R wrist  Lymphadenopathy:    She has no cervical adenopathy.  Neurological: She is alert and oriented to person,  place, and time. Gait normal.  Skin: Skin is warm and dry.  Psychiatric: She has a normal mood and affect. Her behavior is normal.  Vitals reviewed.   Results for orders placed or performed in visit on 06/21/15  POCT Glucose (CBG)  Result Value Ref Range   POC Glucose 83 70 - 99 mg/dl      Assessment & Plan:   Encounter Diagnoses  Name  Primary?  . Right knee pain   . Ganglion cyst of wrist, right   . Cigarette nicotine dependence without complication   . Morbid obesity, unspecified obesity type ([place])   . Screening for diabetes mellitus Yes  . Screening cholesterol level   . Screening for breast cancer     -order mammogram for after April 14 -gave pt [facility] discount application -order xray R knee -get Baseline labs -counseled on smoking cessation -f/u 1 month.

## 2015-06-21 NOTE — Patient Instructions (Signed)
Smoking Cessation, Tips for Success If you are ready to quit smoking, congratulations! You have chosen to help yourself be healthier. Cigarettes bring nicotine, tar, carbon monoxide, and other irritants into your body. Your lungs, heart, and blood vessels will be able to work better without these poisons. There are many different ways to quit smoking. Nicotine gum, nicotine patches, a nicotine inhaler, or nicotine nasal spray can help with physical craving. Hypnosis, support groups, and medicines help break the habit of smoking. WHAT THINGS CAN I DO TO MAKE QUITTING EASIER?  Here are some tips to help you quit for good:  Pick a date when you will quit smoking completely. Tell all of your friends and family about your plan to quit on that date.  Do not try to slowly cut down on the number of cigarettes you are smoking. Pick a quit date and quit smoking completely starting on that day.  Throw away all cigarettes.   Clean and remove all ashtrays from your home, work, and car.  On a card, write down your reasons for quitting. Carry the card with you and read it when you get the urge to smoke.  Cleanse your body of nicotine. Drink enough water and fluids to keep your urine clear or pale yellow. Do this after quitting to flush the nicotine from your body.  Learn to predict your moods. Do not let a bad situation be your excuse to have a cigarette. Some situations in your life might tempt you into wanting a cigarette.  Never have "just one" cigarette. It leads to wanting another and another. Remind yourself of your decision to quit.  Change habits associated with smoking. If you smoked while driving or when feeling stressed, try other activities to replace smoking. Stand up when drinking your coffee. Brush your teeth after eating. Sit in a different chair when you read the paper. Avoid alcohol while trying to quit, and try to drink fewer caffeinated beverages. Alcohol and caffeine may urge you to  smoke.  Avoid foods and drinks that can trigger a desire to smoke, such as sugary or spicy foods and alcohol.  Ask people who smoke not to smoke around you.  Have something planned to do right after eating or having a cup of coffee. For example, plan to take a walk or exercise.  Try a relaxation exercise to calm you down and decrease your stress. Remember, you may be tense and nervous for the first 2 weeks after you quit, but this will pass.  Find new activities to keep your hands busy. Play with a pen, coin, or rubber band. Doodle or draw things on paper.  Brush your teeth right after eating. This will help cut down on the craving for the taste of tobacco after meals. You can also try mouthwash.   Use oral substitutes in place of cigarettes. Try using lemon drops, carrots, cinnamon sticks, or chewing gum. Keep them handy so they are available when you have the urge to smoke.  When you have the urge to smoke, try deep breathing.  Designate your home as a nonsmoking area.  If you are a heavy smoker, ask your health care provider about a prescription for nicotine chewing gum. It can ease your withdrawal from nicotine.  Reward yourself. Set aside the cigarette money you save and buy yourself something nice.  Look for support from others. Join a support group or smoking cessation program. Ask someone at home or at work to help you with your plan   to quit smoking.  Always ask yourself, "Do I need this cigarette or is this just a reflex?" Tell yourself, "Today, I choose not to smoke," or "I do not want to smoke." You are reminding yourself of your decision to quit.  Do not replace cigarette smoking with electronic cigarettes (commonly called e-cigarettes). The safety of e-cigarettes is unknown, and some may contain harmful chemicals.  If you relapse, do not give up! Plan ahead and think about what you will do the next time you get the urge to smoke. HOW WILL I FEEL WHEN I QUIT SMOKING? You  may have symptoms of withdrawal because your body is used to nicotine (the addictive substance in cigarettes). You may crave cigarettes, be irritable, feel very hungry, cough often, get headaches, or have difficulty concentrating. The withdrawal symptoms are only temporary. They are strongest when you first quit but will go away within 10-14 days. When withdrawal symptoms occur, stay in control. Think about your reasons for quitting. Remind yourself that these are signs that your body is healing and getting used to being without cigarettes. Remember that withdrawal symptoms are easier to treat than the major diseases that smoking can cause.  Even after the withdrawal is over, expect periodic urges to smoke. However, these cravings are generally short lived and will go away whether you smoke or not. Do not smoke! WHAT RESOURCES ARE AVAILABLE TO HELP ME QUIT SMOKING? Your health care provider can direct you to community resources or hospitals for support, which may include:  Group support.  Education.  Hypnosis.  Therapy.   This information is not intended to replace advice given to you by your health care provider. Make sure you discuss any questions you have with your health care provider.   Document Released: 12/28/2003 Document Revised: 04/21/2014 Document Reviewed: 09/16/2012 Elsevier Interactive Patient Education 2016 [employer]  

## 2015-06-25 DIAGNOSIS — M67439 Ganglion, unspecified wrist: Secondary | ICD-10-CM | POA: Insufficient documentation

## 2015-06-25 DIAGNOSIS — M25561 Pain in right knee: Secondary | ICD-10-CM | POA: Insufficient documentation

## 2015-06-25 DIAGNOSIS — F17210 Nicotine dependence, cigarettes, uncomplicated: Secondary | ICD-10-CM

## 2015-06-25 HISTORY — DX: Ganglion, unspecified wrist: M67.439

## 2015-06-25 HISTORY — DX: Nicotine dependence, cigarettes, uncomplicated: F17.210

## 2015-06-27 LAB — CBC
HCT: 38 % (ref 36.0–46.0)
Hemoglobin: 12.2 g/dL (ref 12.0–15.0)
MCH: 28.8 pg (ref 26.0–34.0)
MCHC: 32.1 g/dL (ref 30.0–36.0)
MCV: 89.8 fL (ref 78.0–100.0)
MPV: 13.1 fL — ABNORMAL HIGH (ref 8.6–12.4)
PLATELETS: 235 10*3/uL (ref 150–400)
RBC: 4.23 MIL/uL (ref 3.87–5.11)
RDW: 14.8 % (ref 11.5–15.5)
WBC: 6.7 10*3/uL (ref 4.0–10.5)

## 2015-06-27 LAB — COMPLETE METABOLIC PANEL WITH GFR
ALT: 11 U/L (ref 6–29)
AST: 13 U/L (ref 10–35)
Albumin: 3.4 g/dL — ABNORMAL LOW (ref 3.6–5.1)
Alkaline Phosphatase: 63 U/L (ref 33–130)
BUN: 11 mg/dL (ref 7–25)
CO2: 30 mmol/L (ref 20–31)
Calcium: 8.8 mg/dL (ref 8.6–10.4)
Chloride: 105 mmol/L (ref 98–110)
Creat: 0.73 mg/dL (ref 0.50–1.05)
GFR, Est African American: >89 mL/min (ref >=60)
GLUCOSE: 99 mg/dL (ref 65–99)
Potassium: 4.6 mmol/L (ref 3.5–5.3)
SODIUM: 142 mmol/L (ref 135–146)
Total Bilirubin: 0.3 mg/dL (ref 0.2–1.2)
Total Protein: 6.6 g/dL (ref 6.1–8.1)

## 2015-06-27 LAB — TSH: TSH: 1.66 mIU/L

## 2015-06-27 LAB — LIPID PANEL
Cholesterol: 170 mg/dL (ref 125–200)
HDL: 42 mg/dL — ABNORMAL LOW (ref >=46)
LDL CALC: 106 mg/dL (ref <130)
Total CHOL/HDL Ratio: 4 Ratio (ref <=5.0)
Triglycerides: 110 mg/dL (ref <150)
VLDL: 22 mg/dL (ref <30)

## 2015-06-28 LAB — HEMOGLOBIN A1C
Hgb A1c MFr Bld: 5.9 % — ABNORMAL HIGH (ref <5.7)
Mean Plasma Glucose: 123 mg/dL — ABNORMAL HIGH (ref <117)

## 2015-07-09 ENCOUNTER — Emergency Department (HOSPITAL_COMMUNITY): Payer: Self-pay

## 2015-07-09 ENCOUNTER — Emergency Department (HOSPITAL_COMMUNITY)
Admission: EM | Admit: 2015-07-09 | Discharge: 2015-07-09 | Disposition: A | Discharge status: Home / Self Care | Payer: Self-pay | Attending: Emergency Medicine | Admitting: Emergency Medicine

## 2015-07-09 ENCOUNTER — Encounter (HOSPITAL_COMMUNITY): Payer: Self-pay | Admitting: Emergency Medicine

## 2015-07-09 DIAGNOSIS — S93402A Sprain of unspecified ligament of left ankle, initial encounter: Secondary | ICD-10-CM | POA: Insufficient documentation

## 2015-07-09 DIAGNOSIS — S8011XA Contusion of right lower leg, initial encounter: Secondary | ICD-10-CM

## 2015-07-09 DIAGNOSIS — Y929 Unspecified place or not applicable: Secondary | ICD-10-CM | POA: Insufficient documentation

## 2015-07-09 DIAGNOSIS — W109XXA Fall (on) (from) unspecified stairs and steps, initial encounter: Secondary | ICD-10-CM | POA: Insufficient documentation

## 2015-07-09 DIAGNOSIS — S8001XA Contusion of right knee, initial encounter: Secondary | ICD-10-CM | POA: Insufficient documentation

## 2015-07-09 DIAGNOSIS — Y999 Unspecified external cause status: Secondary | ICD-10-CM | POA: Insufficient documentation

## 2015-07-09 DIAGNOSIS — M170 Bilateral primary osteoarthritis of knee: Secondary | ICD-10-CM | POA: Insufficient documentation

## 2015-07-09 DIAGNOSIS — F17210 Nicotine dependence, cigarettes, uncomplicated: Secondary | ICD-10-CM | POA: Insufficient documentation

## 2015-07-09 DIAGNOSIS — Y939 Activity, unspecified: Secondary | ICD-10-CM | POA: Insufficient documentation

## 2015-07-09 MED ORDER — HYDROCODONE-ACETAMINOPHEN 5-325 MG PO TABS: 1.0000 | ORAL_TABLET | ORAL | Status: DC | PRN

## 2015-07-09 MED ORDER — DICLOFENAC SODIUM 75 MG PO TBEC: 75.0000 mg | DELAYED_RELEASE_TABLET | Freq: Two times a day (BID) | ORAL | Status: DC

## 2015-07-09 NOTE — ED Provider Notes (Signed)
CSN: [id number]     Arrival date & time 07/09/15  1130 History  By signing my name below, I, [name], attest that this documentation has been prepared under the direction and in the presence of Lily Kocher, PA-C. Electronically Signed: [name], ED Scribe. 07/09/2015. 12:36 PM.  Chief Complaint  Patient presents with  . Fall   Patient is a 55 y.o. female presenting with fall. The history is provided by the patient. No language interpreter was used.  Fall This is a new problem. The current episode started 3 to 5 hours ago. The problem occurs constantly. The problem has been gradually worsening. Pertinent negatives include no chest pain, no abdominal pain, no headaches and no shortness of breath. Exacerbated by: movement. Nothing relieves the symptoms. She has tried nothing for the symptoms. The treatment provided no relief.   HPI Comments: Michele Romero is a 55 y.o. female who presents to the Emergency Department complaining of sudden onset, constant, moderate pain to multiple areas s/p mechanical fall that occurred 4 hours ago. Pt reports she fell down 4 steps, falling over a grill, and onto the ground after her right knee 'gave out' on her. She is not taking blood thinning medication. No LOC or head injury. She notes left ankle pain, right knee pain, and lower back pain. She was able to present to work this morning following the fall but sought medical evaluation after worsening of the pain. Pt is ambulatory without difficulty.   Past Medical History  Diagnosis Date  . Migraines   . Bronchitis    Past Surgical History  Procedure Laterality Date  . Knee arthroplasty      left knee  . Cholecystectomy    . Appendectomy    . Tubal ligation  1987   Family History  Problem Relation Age of Onset  . Diabetes Mother   . Hypertension Mother   . Heart disease Mother     CHF  . Stroke Mother   . Heart disease Father    Social History  Substance Use Topics  . Smoking  status: Current Every Day Smoker -- 0.25 packs/day for 30 years    Types: Cigarettes  . Smokeless tobacco: Never Used  . Alcohol Use: No   OB History    Gravida Para Term Preterm AB TAB SAB Ectopic Multiple Living   3 2 2  1  1   2      Review of Systems  Respiratory: Negative for shortness of breath.   Cardiovascular: Negative for chest pain.  Gastrointestinal: Negative for abdominal pain.  Musculoskeletal: Positive for back pain ( lower back) and arthralgias ( left ankle, right knee). Negative for gait problem.  Skin: Negative for color change and wound.  Neurological: Negative for syncope and headaches.  Hematological: Does not bruise/bleed easily.  All other systems reviewed and are negative.  Allergies  Codeine  Home Medications   Prior to Admission medications   Not on File   BP 127/78 mmHg  Pulse 79  Temp(Src) 97.9 F (36.6 C) (Oral)  Resp 18  Ht 5\' 2"  (1.575 m)  Wt 247 lb (112.038 kg)  BMI 45.17 kg/m2  SpO2 100% Physical Exam  Constitutional: She is oriented to person, place, and time. She appears well-developed and well-nourished. No distress.  HENT:  Head: Normocephalic.  Eyes: Conjunctivae are normal.  Neck: Normal range of motion. Neck supple.  Cardiovascular: Normal rate.   Pulmonary/Chest: Effort normal. No respiratory distress.  Musculoskeletal: Normal range of motion.  She exhibits tenderness.  Left ankle; pain of the medial malleolus, full ROM of left toes, capillary refill less than 2 seconds, DP 2+, no temperature change of the LLE.   Right lower extremity; degenerative joint disease changes present in right knee, tenderness to medial aspect of knee, no deformity of the quadricept area, patella midline, some swelling of RLE, no pitting edema, DP 2+, capillary refill less than 2 seconds.  Back; tenderness of the left lumbar paraspinal area, no palpable step-off of the lumbar area, tenderness over left mid-lower trapezius area, no palpable step-off of  cervical spine.   Neurological: She is alert and oriented to person, place, and time. Coordination normal.  No motor or sensory deficit of upper or lower extremity.   Skin: Skin is warm.  Psychiatric: She has a normal mood and affect. Her behavior is normal.  Nursing note and vitals reviewed.   ED Course  Procedures  DIAGNOSTIC STUDIES: Oxygen Saturation is 100% on RA, normal by my interpretation.    COORDINATION OF CARE: 12:23 PM Discussed treatment plan with pt at bedside which includes Xray of left ankle and right knee and pt agreed to plan.  Imaging Review Dg Ankle Complete Left  07/09/2015  CLINICAL DATA:  Pain swelling in the left ankle EXAM: LEFT ANKLE COMPLETE - 3+ VIEW COMPARISON:  None. FINDINGS: There is no evidence of fracture, dislocation, or joint effusion. There is no evidence of arthropathy or other focal bone abnormality. Mild soft tissue swelling around the ankle. IMPRESSION: No acute osseous injury of the left ankle. Electronically Signed   By: Kathreen Devoid   On: 07/09/2015 13:07   Dg Knee Complete 4 Views Right  07/09/2015  CLINICAL DATA:  Pain following fall EXAM: RIGHT KNEE - COMPLETE 4+ VIEW COMPARISON:  July 12, 2011 FINDINGS: Frontal, lateral, and bilateral oblique views were obtained. There is no fracture or dislocation. There is no appreciable joint effusion. There is marked narrowing medially and in the patellofemoral joint region. There is spurring in all compartments, progressed on the right and only minimally progressed elsewhere compared to 4 year prior study. No erosive change. IMPRESSION: Extensive osteoarthritic change with overall slight progression compared to 4 years prior. No acute fracture or dislocation. No joint effusion. Electronically Signed   By: Lowella Grip III M.D.   On: 07/09/2015 13:07   I have personally reviewed and evaluated these images results as part of my medical decision-making.   MDM  X-ray of the left ankle is negative for  fracture or dislocation. X-ray of the right knee shows extensive arthritis changes present, but no fracture, no dislocation.  Ankle stirrup splint applied to the left ankle. A prescription for diclofenac and Norco given to the patient. Patient will follow-up with orthopedics if not improving.    Final diagnoses:  None    *I have reviewed nursing notes, vital signs, and all appropriate lab and imaging results for this patient.**  **I personally performed the services described in this documentation, which was scribed in my presence. The recorded information has been reviewed and is accurate.Lily Kocher, PA-C 07/10/15 [place], MD 07/11/15 [phone]

## 2015-07-09 NOTE — ED Notes (Signed)
Patient tripped down approximately 4 steps at her house this AM. C/o right leg pain, left ankle, left shoulder pain. Ambulatory with limp.

## 2015-07-09 NOTE — Discharge Instructions (Signed)
Your x-rays are negative for fracture or dislocation. The x-ray of your knee reveals advanced arthritis. Please discuss this with Dr. Aline Brochure, or the orthopedic specialist of your choice. Please use diclofenac 2 times daily with food. Use Norco every 4 hours, do not take this medicine on an empty stomach. Please use the ankle splint for the next 7-10 days. Ankle Sprain An ankle sprain is an injury to the strong, fibrous tissues (ligaments) that hold your ankle bones together.  HOME CARE   Put ice on your ankle for 1-2 days or as told by your doctor.  Put ice in a plastic bag.  Place a towel between your skin and the bag.  Leave the ice on for 15-20 minutes at a time, every 2 hours while you are awake.  Only take medicine as told by your doctor.  Raise (elevate) your injured ankle above the level of your heart as much as possible for 2-3 days.  Use crutches if your doctor tells you to. Slowly put your own weight on the affected ankle. Use the crutches until you can walk without pain.  If you have a plaster splint:  Do not rest it on anything harder than a pillow for 24 hours.  Do not put weight on it.  Do not get it wet.  Take it off to shower or bathe.  If given, use an elastic wrap or support stocking for support. Take the wrap off if your toes lose feeling (numb), tingle, or turn cold or blue.  If you have an air splint:  Add or let out air to make it comfortable.  Take it off at night and to shower and bathe.  Wiggle your toes and move your ankle up and down often while you are wearing it. GET HELP IF:  You have rapidly increasing bruising or puffiness (swelling).  Your toes feel very cold.  You lose feeling in your foot.  Your medicine does not help your pain. GET HELP RIGHT AWAY IF:   Your toes lose feeling (numb) or turn blue.  You have severe pain that is increasing. MAKE SURE YOU:   Understand these instructions.  Will watch your condition.  Will get  help right away if you are not doing well or get worse.   This information is not intended to replace advice given to you by your health care provider. Make sure you discuss any questions you have with your health care provider.   Document Released: 09/17/2007 Document Revised: 04/21/2014 Document Reviewed: 10/13/2011 Elsevier Interactive Patient Education 2016 Elsevier Inc.  Osteoarthritis Osteoarthritis is a disease that causes soreness and inflammation of a joint. It occurs when the cartilage at the affected joint wears down. Cartilage acts as a cushion, covering the ends of bones where they meet to form a joint. Osteoarthritis is the most common form of arthritis. It often occurs in older people. The joints affected most often by this condition include those in the:  Ends of the fingers.  Thumbs.  Neck.  Lower back.  Knees.  Hips. CAUSES  Over time, the cartilage that covers the ends of bones begins to wear away. This causes bone to rub on bone, producing pain and stiffness in the affected joints.  RISK FACTORS Certain factors can increase your chances of having osteoarthritis, including:  Older age.  Excessive body weight.  Overuse of joints.  Previous joint injury. SIGNS AND SYMPTOMS   Pain, swelling, and stiffness in the joint.  Over time, the joint may  lose its normal shape.  Small deposits of bone (osteophytes) may grow on the edges of the joint.  Bits of bone or cartilage can break off and float inside the joint space. This may cause more pain and damage. DIAGNOSIS  Your health care provider will do a physical exam and ask about your symptoms. Various tests may be ordered, such as:  X-rays of the affected joint.  Blood tests to rule out other types of arthritis. Additional tests may be used to diagnose your condition. TREATMENT  Goals of treatment are to control pain and improve joint function. Treatment plans may include:  A prescribed exercise program  that allows for rest and joint relief.  A weight control plan.  Pain relief techniques, such as:  Properly applied heat and cold.  Electric pulses delivered to nerve endings under the skin (transcutaneous electrical nerve stimulation [TENS]).  Massage.  Certain nutritional supplements.  Medicines to control pain, such as:  Acetaminophen.  Nonsteroidal anti-inflammatory drugs (NSAIDs), such as naproxen.  Narcotic or central-acting agents, such as tramadol.  Corticosteroids. These can be given orally or as an injection.  Surgery to reposition the bones and relieve pain (osteotomy) or to remove loose pieces of bone and cartilage. Joint replacement may be needed in advanced states of osteoarthritis. HOME CARE INSTRUCTIONS   Take medicines only as directed by your health care provider.  Maintain a healthy weight. Follow your health care provider's instructions for weight control. This may include dietary instructions.  Exercise as directed. Your health care provider can recommend specific types of exercise. These may include:  Strengthening exercises. These are done to strengthen the muscles that support joints affected by arthritis. They can be performed with weights or with exercise bands to add resistance.  Aerobic activities. These are exercises, such as brisk walking or low-impact aerobics, that get your heart pumping.  Range-of-motion activities. These keep your joints limber.  Balance and agility exercises. These help you maintain daily living skills.  Rest your affected joints as directed by your health care provider.  Keep all follow-up visits as directed by your health care provider. SEEK MEDICAL CARE IF:   Your skin turns red.  You develop a rash in addition to your joint pain.  You have worsening joint pain.  You have a fever along with joint or muscle aches. SEEK IMMEDIATE MEDICAL CARE IF:  You have a significant loss of weight or appetite.  You have  night sweats. [place] of Arthritis and Musculoskeletal and Skin Diseases: www.niams.SouthExposed.es  [employer] on Aging: http://kim-miller.com/  American College of Rheumatology: www.rheumatology.org   This information is not intended to replace advice given to you by your health care provider. Make sure you discuss any questions you have with your health care provider.   Document Released: 03/31/2005 Document Revised: 04/21/2014 Document Reviewed: 12/06/2012 Elsevier Interactive Patient Education [employer].

## 2015-07-23 ENCOUNTER — Ambulatory Visit: Payer: Self-pay | Admitting: Physician Assistant

## 2015-07-23 ENCOUNTER — Encounter: Payer: Self-pay | Admitting: Physician Assistant

## 2015-07-23 VITALS — BP 114/72 | HR 64 | Temp 97.0°F | Ht 61.75 in | Wt 248.2 lb

## 2015-07-23 DIAGNOSIS — F17210 Nicotine dependence, cigarettes, uncomplicated: Secondary | ICD-10-CM

## 2015-07-23 DIAGNOSIS — M25561 Pain in right knee: Secondary | ICD-10-CM

## 2015-07-23 DIAGNOSIS — M1711 Unilateral primary osteoarthritis, right knee: Secondary | ICD-10-CM

## 2015-07-23 NOTE — Progress Notes (Signed)
BP 114/72 mmHg  Pulse 64  Temp(Src) 97 F (36.1 C)  Ht 5' 1.75" (1.568 m)  Wt 248 lb 3.2 oz (112.583 kg)  BMI 45.79 kg/m2  SpO2 99%   Subjective:    Patient ID: Michele Romero, female    DOB: 10-18-1960, 55 y.o.   MRN: [id number]  HPI: Michele Romero is a 55 y.o. female presenting on 07/23/2015 for Knee Pain   HPI   Pt turned in her [employer] discount application  Pt going for mammo 07/30/15  She is Still smoking  She says Diclofenac is helping knee but it is still hurting a lot and she thinks that is why she fell recently (and went to the ER)  Relevant past medical, surgical, family and social history reviewed and updated as indicated. Interim medical history since our last visit reviewed. Allergies and medications reviewed and updated.  Current outpatient prescriptions:  .  diclofenac (VOLTAREN) 75 MG EC tablet, Take 1 tablet (75 mg total) by mouth 2 (two) times daily., Disp: 14 tablet, Rfl: 0 .  HYDROcodone-acetaminophen (NORCO/VICODIN) 5-325 MG tablet, Take 1 tablet by mouth every 4 (four) hours as needed., Disp: 15 tablet, Rfl: 0   Review of Systems  Constitutional: Negative for fever, chills, diaphoresis, appetite change, fatigue and unexpected weight change.  HENT: Positive for sneezing. Negative for congestion, dental problem, drooling, facial swelling, hearing loss, mouth sores, sore throat and trouble swallowing.   Eyes: Positive for redness and itching. Negative for pain, discharge and visual disturbance.  Respiratory: Positive for cough. Negative for choking, shortness of breath and wheezing.   Cardiovascular: Negative for chest pain, palpitations and leg swelling.  Gastrointestinal: Negative for vomiting, abdominal pain, diarrhea, constipation and blood in stool.  Endocrine: Negative for cold intolerance, heat intolerance and polydipsia.  Genitourinary: Negative for dysuria, hematuria and decreased urine volume.  Musculoskeletal: Positive for back pain,  arthralgias and gait problem.  Skin: Negative for rash.  Allergic/Immunologic: Positive for environmental allergies.  Neurological: Negative for seizures, syncope, light-headedness and headaches.  Hematological: Negative for adenopathy.  Psychiatric/Behavioral: Negative for suicidal ideas, dysphoric mood and agitation. The patient is not nervous/anxious.     Per HPI unless specifically indicated above     Objective:    BP 114/72 mmHg  Pulse 64  Temp(Src) 97 F (36.1 C)  Ht 5' 1.75" (1.568 m)  Wt 248 lb 3.2 oz (112.583 kg)  BMI 45.79 kg/m2  SpO2 99%  Wt Readings from Last 3 Encounters:  07/23/15 248 lb 3.2 oz (112.583 kg)  07/09/15 247 lb (112.038 kg)  06/21/15 247 lb 8 oz (112.265 kg)    Physical Exam  Constitutional: She is oriented to person, place, and time. She appears well-developed and well-nourished.  HENT:  Head: Normocephalic and atraumatic.  Neck: Neck supple.  Cardiovascular: Normal rate and regular rhythm.   Pulmonary/Chest: Effort normal and breath sounds normal.  Abdominal: Soft. Bowel sounds are normal. She exhibits no mass. There is no hepatosplenomegaly. There is no tenderness.  Musculoskeletal: She exhibits no edema.       Right knee: She exhibits normal range of motion, no swelling and no effusion. Tenderness found.  Crepitus R knee  Lymphadenopathy:    She has no cervical adenopathy.  Neurological: She is alert and oriented to person, place, and time.  Skin: Skin is warm and dry.  Psychiatric: She has a normal mood and affect. Her behavior is normal.  Vitals reviewed.   Results for orders placed or performed in  visit on 06/21/15  Lipid Profile  Result Value Ref Range   Cholesterol 170 125 - 200 mg/dL   Triglycerides 110 <150 mg/dL   HDL 42 (L) >=46 mg/dL   Total CHOL/HDL Ratio 4.0 <=5.0 Ratio   VLDL 22 <30 mg/dL   LDL Cholesterol 106 <130 mg/dL  COMPLETE METABOLIC PANEL WITH GFR  Result Value Ref Range   Sodium 142 135 - 146 mmol/L    Potassium 4.6 3.5 - 5.3 mmol/L   Chloride 105 98 - 110 mmol/L   CO2 30 20 - 31 mmol/L   Glucose, Bld 99 65 - 99 mg/dL   BUN 11 7 - 25 mg/dL   Creat 0.73 0.50 - 1.05 mg/dL   Total Bilirubin 0.3 0.2 - 1.2 mg/dL   Alkaline Phosphatase 63 33 - 130 U/L   AST 13 10 - 35 U/L   ALT 11 6 - 29 U/L   Total Protein 6.6 6.1 - 8.1 g/dL   Albumin 3.4 (L) 3.6 - 5.1 g/dL   Calcium 8.8 8.6 - 10.4 mg/dL   GFR, Est African American >89 >=60 mL/min   GFR, Est Non African American >89 >=60 mL/min  CBC  Result Value Ref Range   WBC 6.7 4.0 - 10.5 K/uL   RBC 4.23 3.87 - 5.11 MIL/uL   Hemoglobin 12.2 12.0 - 15.0 g/dL   HCT 38.0 36.0 - 46.0 %   MCV 89.8 78.0 - 100.0 fL   MCH 28.8 26.0 - 34.0 pg   MCHC 32.1 30.0 - 36.0 g/dL   RDW 14.8 11.5 - 15.5 %   Platelets 235 150 - 400 K/uL   MPV 13.1 (H) 8.6 - 12.4 fL  HgB A1c  Result Value Ref Range   Hgb A1c MFr Bld 5.9 (H) <5.7 %   Mean Plasma Glucose 123 (H) <117 mg/dL  TSH  Result Value Ref Range   TSH 1.66 mIU/L  POCT Glucose (CBG)  Result Value Ref Range   POC Glucose 83 70 - 99 mg/dl      Assessment & Plan:   Encounter Diagnoses  Name Primary?  . Right knee pain Yes  . Osteoarthritis of right knee, unspecified osteoarthritis type   . Cigarette nicotine dependence without complication   . Morbid obesity, unspecified obesity type ([place])      -Reviewed labs with pt -Reviewed R knee xray with pt- extensive OA- pt believes this is why she fell about a week ago. -Refer to ortho for knee -counseled on smoking cessation -F/u  6 months.  RTO sooner prn

## 2015-07-30 ENCOUNTER — Ambulatory Visit (HOSPITAL_COMMUNITY): Payer: PRIVATE HEALTH INSURANCE

## 2015-08-24 ENCOUNTER — Other Ambulatory Visit: Payer: Self-pay | Admitting: Physician Assistant

## 2015-08-24 ENCOUNTER — Ambulatory Visit (HOSPITAL_COMMUNITY): Payer: PRIVATE HEALTH INSURANCE

## 2015-08-24 ENCOUNTER — Ambulatory Visit (HOSPITAL_COMMUNITY)
Admission: RE | Admit: 2015-08-24 | Discharge: 2015-08-24 | Disposition: A | Discharge status: Home / Self Care | Payer: PRIVATE HEALTH INSURANCE | Source: Ambulatory Visit | Attending: Physician Assistant | Admitting: Physician Assistant

## 2015-08-24 DIAGNOSIS — Z1231 Encounter for screening mammogram for malignant neoplasm of breast: Secondary | ICD-10-CM

## 2015-10-09 ENCOUNTER — Ambulatory Visit: Payer: Self-pay | Admitting: Physician Assistant

## 2015-10-09 ENCOUNTER — Encounter: Payer: Self-pay | Admitting: Physician Assistant

## 2015-10-09 VITALS — BP 128/76 | HR 99 | Temp 97.3°F | Ht 61.75 in | Wt 248.4 lb

## 2015-10-09 DIAGNOSIS — J069 Acute upper respiratory infection, unspecified: Secondary | ICD-10-CM

## 2015-10-09 MED ORDER — PROMETHAZINE-DM 6.25-15 MG/5ML PO SYRP: 5.0000 mL | ORAL_SOLUTION | Freq: Four times a day (QID) | ORAL | Status: DC | PRN

## 2015-10-09 NOTE — Progress Notes (Signed)
BP 128/76 mmHg  Pulse 99  Temp(Src) 97.3 F (36.3 C)  Ht 5' 1.75" (1.568 m)  Wt 248 lb 6.4 oz (112.674 kg)  BMI 45.83 kg/m2  SpO2 97%   Subjective:    Patient ID: Michele Romero, female    DOB: [birth year], 55 y.o.   MRN: [id number]  HPI: Michele Romero is a 55 y.o. female presenting on 10/09/2015 for Nasal Congestion   HPI Chief Complaint  Patient presents with  . Nasal Congestion    pt stated sneezing on Thursday, 10-04-15. pt has chest and nasal congestion, runny nose, cough, R ear pain, L ear discomfort, itchy throat, yellow phlegm, wheezing, and fever of 100 degrees F on Friday and Saturday. pt denies SOB right now, but has SOB when up walking. pt started taking theraflu saturday night and it has not been very helpful. pt states it just helps her sleep better at night.   No fever since Saturday.   Pt continues to smoke. No OTC except theraflu once.  Relevant past medical, surgical, family and social history reviewed and updated as indicated. Interim medical history since our last visit reviewed. Allergies and medications reviewed and updated.  Current outpatient prescriptions:  .  IBUPROFEN PO, Take 2 tablets by mouth at bedtime., Disp: , Rfl:   Review of Systems  Constitutional: Positive for fatigue. Negative for fever.  HENT: Positive for congestion, ear pain, sneezing and sore throat.   Eyes: Positive for itching. Negative for discharge and redness.  Respiratory: Positive for cough. Negative for shortness of breath and wheezing.   Cardiovascular: Negative for chest pain.  Gastrointestinal: Negative for vomiting, abdominal pain and diarrhea.  Endocrine: Positive for cold intolerance. Negative for heat intolerance.  Musculoskeletal: Positive for back pain and arthralgias.  Psychiatric/Behavioral: Negative for suicidal ideas, dysphoric mood and agitation. The patient is not nervous/anxious.     Per HPI unless specifically indicated above     Objective:    BP  128/76 mmHg  Pulse 99  Temp(Src) 97.3 F (36.3 C)  Ht 5' 1.75" (1.568 m)  Wt 248 lb 6.4 oz (112.674 kg)  BMI 45.83 kg/m2  SpO2 97%  Wt Readings from Last 3 Encounters:  10/09/15 248 lb 6.4 oz (112.674 kg)  07/23/15 248 lb 3.2 oz (112.583 kg)  07/09/15 247 lb (112.038 kg)    Physical Exam  Constitutional: She is oriented to person, place, and time. She appears well-developed and well-nourished.  HENT:  Head: Normocephalic and atraumatic.  Right Ear: Hearing, tympanic membrane, external ear and ear canal normal.  Left Ear: Hearing, tympanic membrane, external ear and ear canal normal.  Nose: Mucosal edema and rhinorrhea present. Right sinus exhibits no maxillary sinus tenderness and no frontal sinus tenderness. Left sinus exhibits no maxillary sinus tenderness and no frontal sinus tenderness.  Mouth/Throat: Uvula is midline and oropharynx is clear and moist. No oropharyngeal exudate.  Neck: Neck supple.  Cardiovascular: Normal rate and regular rhythm.   Pulmonary/Chest: Effort normal and breath sounds normal. She has no wheezes.  Lymphadenopathy:    She has no cervical adenopathy.  Neurological: She is alert and oriented to person, place, and time.  Skin: Skin is warm and dry.  Psychiatric: She has a normal mood and affect. Her behavior is normal.  Vitals reviewed.       Assessment & Plan:   Encounter Diagnosis  Name Primary?  . Acute upper respiratory infection Yes    -counseled pt on rest, fluids, symptomatic treatment.  Counseled  to avoid driving on Rx due to sedation.  Pt declined note for work -f/u as scheduled.  RTO sooner if worsens or persists

## 2015-10-09 NOTE — Patient Instructions (Signed)
Upper Respiratory Infection, Adult Most upper respiratory infections (URIs) are a viral infection of the air passages leading to the lungs. A URI affects the nose, throat, and upper air passages. The most common type of URI is nasopharyngitis and is typically referred to as "the common cold." URIs run their course and usually go away on their own. Most of the time, a URI does not require medical attention, but sometimes a bacterial infection in the upper airways can follow a viral infection. This is called a secondary infection. Sinus and middle ear infections are common types of secondary upper respiratory infections. Bacterial pneumonia can also complicate a URI. A URI can worsen asthma and chronic obstructive pulmonary disease (COPD). Sometimes, these complications can require emergency medical care and may be life threatening.  CAUSES Almost all URIs are caused by viruses. A virus is a type of germ and can spread from one person to another.  RISKS FACTORS You may be at risk for a URI if:   You smoke.   You have chronic heart or lung disease.  You have a weakened defense (immune) system.   You are very young or very old.   You have nasal allergies or asthma.  You work in crowded or poorly ventilated areas.  You work in health care facilities or schools. SIGNS AND SYMPTOMS  Symptoms typically develop 2-3 days after you come in contact with a cold virus. Most viral URIs last 7-10 days. However, viral URIs from the influenza virus (flu virus) can last 14-18 days and are typically more severe. Symptoms may include:   Runny or stuffy (congested) nose.   Sneezing.   Cough.   Sore throat.   Headache.   Fatigue.   Fever.   Loss of appetite.   Pain in your forehead, behind your eyes, and over your cheekbones (sinus pain).  Muscle aches.  DIAGNOSIS  Your health care provider may diagnose a URI by:  Physical exam.  Tests to check that your symptoms are not due to  another condition such as:  Strep throat.  Sinusitis.  Pneumonia.  Asthma. TREATMENT  A URI goes away on its own with time. It cannot be cured with medicines, but medicines may be prescribed or recommended to relieve symptoms. Medicines may help:  Reduce your fever.  Reduce your cough.  Relieve nasal congestion. HOME CARE INSTRUCTIONS   Take medicines only as directed by your health care provider.   Gargle warm saltwater or take cough drops to comfort your throat as directed by your health care provider.  Use a warm mist humidifier or inhale steam from a shower to increase air moisture. This may make it easier to breathe.  Drink enough fluid to keep your urine clear or pale yellow.   Eat soups and other clear broths and maintain good nutrition.   Rest as needed.   Return to work when your temperature has returned to normal or as your health care provider advises. You may need to stay home longer to avoid infecting others. You can also use a face mask and careful hand washing to prevent spread of the virus.  Increase the usage of your inhaler if you have asthma.   Do not use any tobacco products, including cigarettes, chewing tobacco, or electronic cigarettes. If you need help quitting, ask your health care provider. PREVENTION  The best way to protect yourself from getting a cold is to practice good hygiene.   Avoid oral or hand contact with people with cold   symptoms.   Wash your hands often if contact occurs.  There is no clear evidence that vitamin C, vitamin E, echinacea, or exercise reduces the chance of developing a cold. However, it is always recommended to get plenty of rest, exercise, and practice good nutrition.  SEEK MEDICAL CARE IF:   You are getting worse rather than better.   Your symptoms are not controlled by medicine.   You have chills.  You have worsening shortness of breath.  You have brown or red mucus.  You have yellow or brown nasal  discharge.  You have pain in your face, especially when you bend forward.  You have a fever.  You have swollen neck glands.  You have pain while swallowing.  You have white areas in the back of your throat. SEEK IMMEDIATE MEDICAL CARE IF:   You have severe or persistent:  Headache.  Ear pain.  Sinus pain.  Chest pain.  You have chronic lung disease and any of the following:  Wheezing.  Prolonged cough.  Coughing up blood.  A change in your usual mucus.  You have a stiff neck.  You have changes in your:  Vision.  Hearing.  Thinking.  Mood. MAKE SURE YOU:   Understand these instructions.  Will watch your condition.  Will get help right away if you are not doing well or get worse.   This information is not intended to replace advice given to you by your health care provider. Make sure you discuss any questions you have with your health care provider.   Document Released: 09/24/2000 Document Revised: 08/15/2014 Document Reviewed: 07/06/2013 Elsevier Interactive Patient Education 2016 Elsevier Inc.  

## 2015-12-12 ENCOUNTER — Ambulatory Visit (INDEPENDENT_AMBULATORY_CARE_PROVIDER_SITE_OTHER): Payer: PRIVATE HEALTH INSURANCE | Admitting: Orthopaedic Surgery

## 2015-12-12 ENCOUNTER — Encounter: Payer: Self-pay | Admitting: Orthopaedic Surgery

## 2015-12-12 ENCOUNTER — Ambulatory Visit (INDEPENDENT_AMBULATORY_CARE_PROVIDER_SITE_OTHER): Payer: PRIVATE HEALTH INSURANCE

## 2015-12-12 VITALS — BP 136/86 | HR 89 | Temp 97.3°F | Ht 62.5 in | Wt 245.0 lb

## 2015-12-12 DIAGNOSIS — Z720 Tobacco use: Secondary | ICD-10-CM

## 2015-12-12 DIAGNOSIS — M25561 Pain in right knee: Secondary | ICD-10-CM | POA: Diagnosis not present

## 2015-12-12 DIAGNOSIS — F17200 Nicotine dependence, unspecified, uncomplicated: Secondary | ICD-10-CM

## 2015-12-12 NOTE — Progress Notes (Signed)
Subjective:  My right knee hurts    Patient ID: Michele Romero, female    DOB: January 17, 1961, 55 y.o.   MRN: [id number]  HPI  She has pain of the right knee for over six months.  She went to the ER in late March about her knee hurting. She was given medicine.  It helped a short time. She has gotten worse. She has giving way now, one episode of locking, popping, swelling.  She has fallen because of knee giving way.  She is taking Advil with little help.  She has tried ice, heat, rubs and rest with little help.  Review of Systems  HENT: Negative for congestion.   Respiratory: Negative for cough and shortness of breath.   Cardiovascular: Negative for chest pain and leg swelling.  Endocrine: Positive for cold intolerance.  Musculoskeletal: Positive for arthralgias, gait problem and joint swelling.  Allergic/Immunologic: Positive for environmental allergies.  Neurological: Positive for headaches.   Past Medical History:  Diagnosis Date  . Bronchitis   . Migraines     Past Surgical History:  Procedure Laterality Date  . APPENDECTOMY    . CHOLECYSTECTOMY    . KNEE ARTHROPLASTY     left knee  . TUBAL LIGATION  1987    Current Outpatient Prescriptions on File Prior to Visit  Medication Sig Dispense Refill  . IBUPROFEN PO Take 2 tablets by mouth at bedtime.    . promethazine-dextromethorphan (PROMETHAZINE-DM) 6.25-15 MG/5ML syrup Take 5 mLs by mouth every 6 (six) hours as needed for cough. 118 mL 0   No current facility-administered medications on file prior to visit.     Social History   Social History  . Marital status: Single    Spouse name: N/A  . Number of children: N/A  . Years of education: N/A   Occupational History  . Not on file.   Social History Main Topics  . Smoking status: Current Every Day Smoker    Packs/day: 0.25    Years: 30.00    Types: Cigarettes  . Smokeless tobacco: Never Used  . Alcohol use No  . Drug use:     Frequency: 6.0 times per week   Types: Marijuana  . Sexual activity: Not on file   Other Topics Concern  . Not on file   Social History Narrative  . No narrative on file    Family History  Problem Relation Age of Onset  . Diabetes Mother   . Hypertension Mother   . Heart disease Mother     CHF  . Stroke Mother   . Heart disease Father     BP 136/86   Pulse 89   Temp 97.3 F (36.3 C)   Ht 5' 2.5" (1.588 m)   Wt 245 lb (111.1 kg)   BMI 44.10 kg/m       Objective:   Physical Exam  Constitutional: She is oriented to person, place, and time. She appears well-developed and well-nourished.  HENT:  Head: Normocephalic and atraumatic.  Eyes: Conjunctivae and EOM are normal. Pupils are equal, round, and reactive to light.  Neck: Normal range of motion. Neck supple.  Cardiovascular: Normal rate, regular rhythm and intact distal pulses.   Pulmonary/Chest: Effort normal.  Abdominal: Soft.  Musculoskeletal: She exhibits tenderness (pain of right knee, effusion, ROM 0 to 105, positive medial McMurray, crepitus, limp.  Left knee negative.  Slight edema both feet.).  Neurological: She is alert and oriented to person, place, and time. She displays normal  reflexes. No cranial nerve deficit. She exhibits normal muscle tone. Coordination normal.  Skin: Skin is warm and dry.  Psychiatric: She has a normal mood and affect. Her behavior is normal. Judgment and thought content normal.  Vitals reviewed.    X-rays of the right knee were done, reported separately.     Assessment & Plan:   Encounter Diagnoses  Name Primary?  . Right knee pain Yes  . Tobacco smoker within last 12 months   . Morbid obesity, unspecified obesity type ([place])    I have talked to her about cutting back on smoking,   She is willing to do this.  PROCEDURE NOTE:  The patient requests injections of the right knee , verbal consent was obtained.  The right knee was prepped appropriately after time out was performed.   Sterile technique was  observed and injection of 1 cc of Depo-Medrol 40 mg with several cc's of plain xylocaine. Anesthesia was provided by ethyl chloride and a 20-gauge needle was used to inject the knee area. The injection was tolerated well.  A band aid dressing was applied.  The patient was advised to apply ice later today and tomorrow to the injection sight as needed.  I will get a MRI of the right knee as she has giving way, locking, swelling, no help with conservative treatment, and positive Medial McMurray.  Return after MRI.  Call if any problem.  Precautions discussed.  Electronically Signed Sanjuana Kava, MD 8/30/20179:17 AM

## 2015-12-12 NOTE — Patient Instructions (Signed)
Smoking Cessation, Tips for Success If you are ready to quit smoking, congratulations! You have chosen to help yourself be healthier. Cigarettes bring nicotine, tar, carbon monoxide, and other irritants into your body. Your lungs, heart, and blood vessels will be able to work better without these poisons. There are many different ways to quit smoking. Nicotine gum, nicotine patches, a nicotine inhaler, or nicotine nasal spray can help with physical craving. Hypnosis, support groups, and medicines help break the habit of smoking. WHAT THINGS CAN I DO TO MAKE QUITTING EASIER?  Here are some tips to help you quit for good:  Pick a date when you will quit smoking completely. Tell all of your friends and family about your plan to quit on that date.  Do not try to slowly cut down on the number of cigarettes you are smoking. Pick a quit date and quit smoking completely starting on that day.  Throw away all cigarettes.   Clean and remove all ashtrays from your home, work, and car.  On a card, write down your reasons for quitting. Carry the card with you and read it when you get the urge to smoke.  Cleanse your body of nicotine. Drink enough water and fluids to keep your urine clear or pale yellow. Do this after quitting to flush the nicotine from your body.  Learn to predict your moods. Do not let a bad situation be your excuse to have a cigarette. Some situations in your life might tempt you into wanting a cigarette.  Never have "just one" cigarette. It leads to wanting another and another. Remind yourself of your decision to quit.  Change habits associated with smoking. If you smoked while driving or when feeling stressed, try other activities to replace smoking. Stand up when drinking your coffee. Brush your teeth after eating. Sit in a different chair when you read the paper. Avoid alcohol while trying to quit, and try to drink fewer caffeinated beverages. Alcohol and caffeine may urge you to  smoke.  Avoid foods and drinks that can trigger a desire to smoke, such as sugary or spicy foods and alcohol.  Ask people who smoke not to smoke around you.  Have something planned to do right after eating or having a cup of coffee. For example, plan to take a walk or exercise.  Try a relaxation exercise to calm you down and decrease your stress. Remember, you may be tense and nervous for the first 2 weeks after you quit, but this will pass.  Find new activities to keep your hands busy. Play with a pen, coin, or rubber band. Doodle or draw things on paper.  Brush your teeth right after eating. This will help cut down on the craving for the taste of tobacco after meals. You can also try mouthwash.   Use oral substitutes in place of cigarettes. Try using lemon drops, carrots, cinnamon sticks, or chewing gum. Keep them handy so they are available when you have the urge to smoke.  When you have the urge to smoke, try deep breathing.  Designate your home as a nonsmoking area.  If you are a heavy smoker, ask your health care provider about a prescription for nicotine chewing gum. It can ease your withdrawal from nicotine.  Reward yourself. Set aside the cigarette money you save and buy yourself something nice.  Look for support from others. Join a support group or smoking cessation program. Ask someone at home or at work to help you with your plan   to quit smoking.  Always ask yourself, "Do I need this cigarette or is this just a reflex?" Tell yourself, "Today, I choose not to smoke," or "I do not want to smoke." You are reminding yourself of your decision to quit.  Do not replace cigarette smoking with electronic cigarettes (commonly called e-cigarettes). The safety of e-cigarettes is unknown, and some may contain harmful chemicals.  If you relapse, do not give up! Plan ahead and think about what you will do the next time you get the urge to smoke. HOW WILL I FEEL WHEN I QUIT SMOKING? You  may have symptoms of withdrawal because your body is used to nicotine (the addictive substance in cigarettes). You may crave cigarettes, be irritable, feel very hungry, cough often, get headaches, or have difficulty concentrating. The withdrawal symptoms are only temporary. They are strongest when you first quit but will go away within 10-14 days. When withdrawal symptoms occur, stay in control. Think about your reasons for quitting. Remind yourself that these are signs that your body is healing and getting used to being without cigarettes. Remember that withdrawal symptoms are easier to treat than the major diseases that smoking can cause.  Even after the withdrawal is over, expect periodic urges to smoke. However, these cravings are generally short lived and will go away whether you smoke or not. Do not smoke! WHAT RESOURCES ARE AVAILABLE TO HELP ME QUIT SMOKING? Your health care provider can direct you to community resources or hospitals for support, which may include:  Group support.  Education.  Hypnosis.  Therapy.   This information is not intended to replace advice given to you by your health care provider. Make sure you discuss any questions you have with your health care provider.   Document Released: 12/28/2003 Document Revised: 04/21/2014 Document Reviewed: 09/16/2012 Elsevier Interactive Patient Education 2016 [employer]  

## 2015-12-20 ENCOUNTER — Ambulatory Visit (HOSPITAL_COMMUNITY)
Admission: RE | Admit: 2015-12-20 | Discharge: 2015-12-20 | Disposition: A | Discharge status: Home / Self Care | Payer: Self-pay | Source: Ambulatory Visit | Attending: Orthopaedic Surgery | Admitting: Orthopaedic Surgery

## 2015-12-20 DIAGNOSIS — M7121 Synovial cyst of popliteal space [Baker], right knee: Secondary | ICD-10-CM | POA: Insufficient documentation

## 2015-12-20 DIAGNOSIS — S83501A Sprain of unspecified cruciate ligament of right knee, initial encounter: Secondary | ICD-10-CM | POA: Insufficient documentation

## 2015-12-20 DIAGNOSIS — M1711 Unilateral primary osteoarthritis, right knee: Secondary | ICD-10-CM | POA: Insufficient documentation

## 2015-12-20 DIAGNOSIS — X58XXXA Exposure to other specified factors, initial encounter: Secondary | ICD-10-CM | POA: Insufficient documentation

## 2015-12-20 DIAGNOSIS — S83241A Other tear of medial meniscus, current injury, right knee, initial encounter: Secondary | ICD-10-CM | POA: Insufficient documentation

## 2015-12-20 DIAGNOSIS — M25561 Pain in right knee: Secondary | ICD-10-CM | POA: Insufficient documentation

## 2015-12-25 ENCOUNTER — Ambulatory Visit (INDEPENDENT_AMBULATORY_CARE_PROVIDER_SITE_OTHER): Payer: PRIVATE HEALTH INSURANCE | Admitting: Orthopaedic Surgery

## 2015-12-25 ENCOUNTER — Encounter: Payer: Self-pay | Admitting: Orthopaedic Surgery

## 2015-12-25 ENCOUNTER — Other Ambulatory Visit: Payer: Self-pay | Admitting: *Deleted

## 2015-12-25 VITALS — BP 125/82 | HR 59 | Ht 62.0 in | Wt 240.0 lb

## 2015-12-25 DIAGNOSIS — Z720 Tobacco use: Secondary | ICD-10-CM | POA: Diagnosis not present

## 2015-12-25 DIAGNOSIS — M25561 Pain in right knee: Secondary | ICD-10-CM | POA: Diagnosis not present

## 2015-12-25 DIAGNOSIS — S83206A Unspecified tear of unspecified meniscus, current injury, right knee, initial encounter: Secondary | ICD-10-CM | POA: Diagnosis not present

## 2015-12-25 DIAGNOSIS — F17200 Nicotine dependence, unspecified, uncomplicated: Secondary | ICD-10-CM

## 2015-12-25 NOTE — Progress Notes (Signed)
Patient [id number] L Michele Romero, female DOB:1960-04-18, 55 y.o. [id number]  Chief Complaint  Patient presents with  . Follow-up    right knee pain    HPI  Michele Romero is a 55 y.o. female who has right knee pain with giving way and swelling.  She had MRI which shows: IMPRESSION: Dominant finding is advanced tricompartmental osteoarthritis appearing worst medially.  Complex tearing posterior horn medial meniscus.  Grade 2 sprain of the superior fibers of the MCL without tear is likely related to degenerative disease.  Baker's cyst.  I have explained the findings to her.  I recommended consideration of arthroscopy of the knee.  I will have her see Dr. Aline Brochure for this.  She agrees.  She has had arthroscopy of the left knee by me in the past.  HPI  Body mass index is 43.9 kg/m.  ROS  Review of Systems  HENT: Negative for congestion.   Respiratory: Negative for cough and shortness of breath.   Cardiovascular: Negative for chest pain and leg swelling.  Endocrine: Positive for cold intolerance.  Musculoskeletal: Positive for arthralgias, gait problem and joint swelling.  Allergic/Immunologic: Positive for environmental allergies.  Neurological: Positive for headaches.    Past Medical History:  Diagnosis Date  . Bronchitis   . Migraines     Past Surgical History:  Procedure Laterality Date  . APPENDECTOMY    . CHOLECYSTECTOMY    . KNEE ARTHROPLASTY     left knee  . TUBAL LIGATION  1987    Family History  Problem Relation Age of Onset  . Diabetes Mother   . Hypertension Mother   . Heart disease Mother     CHF  . Stroke Mother   . Heart disease Father     Social History Social History  Substance Use Topics  . Smoking status: Current Every Day Smoker    Packs/day: 0.25    Years: 30.00    Types: Cigarettes  . Smokeless tobacco: Never Used  . Alcohol use No    Allergies  Allergen Reactions  . Codeine Nausea And Vomiting and Rash    Current  Outpatient Prescriptions  Medication Sig Dispense Refill  . IBUPROFEN PO Take 2 tablets by mouth at bedtime.    . promethazine-dextromethorphan (PROMETHAZINE-DM) 6.25-15 MG/5ML syrup Take 5 mLs by mouth every 6 (six) hours as needed for cough. (Patient not taking: Reported on 12/25/2015) 118 mL 0   No current facility-administered medications for this visit.      Physical Exam  Blood pressure 125/82, pulse (!) 59, height 5\' 2"  (1.575 m), weight 240 lb (108.9 kg).  Constitutional: overall normal hygiene, normal nutrition, well developed, normal grooming, normal body habitus. Assistive device:none  Musculoskeletal: gait and station Limp right, muscle tone and strength are normal, no tremors or atrophy is present.  .  Neurological: coordination overall normal.  Deep tendon reflex/nerve stretch intact.  Sensation normal.  Cranial nerves II-XII intact.   Skin:   Normal overall no scars, lesions, ulcers or rashes. No psoriasis.  Psychiatric: Alert and oriented x 3.  Recent memory intact, remote memory unclear.  Normal mood and affect. Well groomed.  Good eye contact.  Cardiovascular: overall no swelling, no varicosities, no edema bilaterally, normal temperatures of the legs and arms, no clubbing, cyanosis and good capillary refill.  Lymphatic: palpation is normal.  The right lower extremity is examined:  Inspection:  Thigh:  Non-tender and no defects  Knee has swelling 1+ effusion.  Joint tenderness is present                        Patient is tender over the medial joint line  Lower Leg:  Has normal appearance and no tenderness or defects  Ankle:  Non-tender and no defects  Foot:  Non-tender and no defects Range of Motion:  Knee:  Range of motion is: 0-105                        Crepitus is  present  Ankle:  Range of motion is normal. Strength and Tone:  The right lower extremity has normal strength and tone. Stability:  Knee:  The knee has positive medial  McMurray.  Ankle:  The ankle is stable.    The patient has been educated about the nature of the problem(s) and counseled on treatment options.  The patient appeared to understand what I have discussed and is in agreement with it.  Encounter Diagnoses  Name Primary?  . Meniscus tear, right, initial encounter Yes  . Right knee pain   . Tobacco smoker within last 12 months   . Morbid obesity, unspecified obesity type [facility])     PLAN Call if any problems.  Precautions discussed.  Continue current medications.   Return to clinic to see Dr. Aline Brochure for consideration of surgery.   Electronically Signed Sanjuana Kava, MD 9/12/201710:42 AM

## 2016-01-07 ENCOUNTER — Telehealth: Payer: Self-pay | Admitting: Orthopedic Surgery

## 2016-01-07 NOTE — Telephone Encounter (Signed)
Patient had left a voice message today asking about surgery.  Has another cell phone number, which has been entered into chart [phone]) although no voice mail set up yet.  Asked about her surgery, as said was waiting to hear more information.* * Per chart notes, patient was covered under [place] 100% discount 06/13/15-12/14/15.  States has re-applied, and is working with Adline Potter, financial counselor at [facility]; however, she is still needing to provide additional financial record information.  We have also contacted Ms. Hilton; response pending.  Discussed with patient any services provided after 12/14/15, including hospital and office visits, surgery, anesthesia, follow up visits, physical therapy, etc, would all be under self-pay.  Patient is re-considering.  Best phone, cell # [phone]

## 2016-01-07 NOTE — Patient Instructions (Signed)
Michele Romero  01/07/2016     @PREFPERIOPPHARMACY @   Your procedure is scheduled on  01/10/2016  Report to [facility] at  1100  A.M.  Call this number if you have problems the morning of surgery:  [phone]   Remember:  Do not eat food or drink liquids after midnight.  Take these medicines the morning of surgery with A SIP OF WATER  promethagine DM(if needed).   Do not wear jewelry, make-up or nail polish.  Do not wear lotions, powders, or perfumes, or deoderant.  Do not shave 48 hours prior to surgery.  Men may shave face and neck.  Do not bring valuables to the hospital.  [facility] is not responsible for any belongings or valuables.  Contacts, dentures or bridgework may not be worn into surgery.  Leave your suitcase in the car.  After surgery it may be brought to your room.  For patients admitted to the hospital, discharge time will be determined by your treatment team.  Patients discharged the day of surgery will not be allowed to drive home.   Name and phone number of your driver:   family Special instructions:  none  Please read over the following fact sheets that you were given. Anesthesia Post-op Instructions and Care and Recovery After Surgery       Meniscus Injury, Arthroscopy Arthroscopy is a surgical procedure that involves the use of a small scope that has a camera and surgical instruments on the end (arthroscope). An arthroscope can be used to repair your meniscus injury.  LET [facility] CARE PROVIDER KNOW ABOUT:  Any allergies you have.  All medicines you are taking, including vitamins, herbs, eyedrops, creams, and over-the-counter medicines.  Any recent colds or infections you have had or currently have.  Previous problems you or members of your family have had with the use of anesthetics.  Any blood disorders or blood clotting problems you have.  Previous surgeries you have had.  Medical conditions you have. RISKS AND  COMPLICATIONS Generally, this is a safe procedure. However, as with any procedure, problems can occur. Possible problems include:  Damage to nerves or blood vessels.  Excess bleeding.  Blood clots.  Infection. BEFORE THE PROCEDURE  Do not eat or drink for 6-8 hours before the procedure.  Take medicines as directed by your surgeon. Ask your surgeon about changing or stopping your regular medicines.  You may have lab tests the morning of surgery. PROCEDURE  You will be given one of the following:   A medicine that numbs the area (local anesthesia).  A medicine that makes you go to sleep (general anesthesia).  A medicine injected into your spine that numbs your body below the waist (spinal anesthesia). Most often, several small cuts (incisions) are made in the knee. The arthroscope and instruments go into the incisions to repair the damage. The torn portion of the meniscus is removed.  During this time, your surgeon may find a partial or complete tear in a cruciate ligament, such as the anterior cruciate ligament (ACL). A completely torn cruciate ligament is reconstructed by taking tissue from another part of the body (grafting) and placing it into the injured area. This requires several larger incisions to complete the repair. Sometimes, open surgery is needed for collateral ligament injuries. If a collateral ligament is found to be injured, your surgeon may staple or suture the tear through a slightly larger incision on the side of the  knee. AFTER THE PROCEDURE You will be taken to the recovery area where your progress will be monitored. When you are awake, stable, and taking fluids without complications, you will be allowed to go home. This is usually the same day. However, more extensive repairs of a ligament may require an overnight stay.  The recovery time after repairing your meniscus or ligament depends on the amount of damage to these structures. It also depends on whether or not  reconstructive knee surgery was needed.   A torn or stretched ligament (ligament sprain) may take 6-8 weeks to heal. It takes about the same amount of time if your surgeon removed a torn meniscus.  A repaired meniscus may require 6-12 weeks of recovery time.  A torn ligament needing reconstructive surgery may take 6-12 months to heal fully.   This information is not intended to replace advice given to you by your health care provider. Make sure you discuss any questions you have with your health care provider.   Document Released: 03/28/2000 Document Revised: 04/05/2013 Document Reviewed: 08/27/2012 Elsevier Interactive Patient Education 2016 [employer].  Arthroscopy, With Meniscus Injury, Care After Refer to this sheet in the next few weeks. These instructions provide you with general information on caring for yourself after your procedure. Your health care provider may also give you specific instructions. Your treatment has been planned according to the current medical practices, but problems sometimes occur. Call your health care provider if you have any problems or questions after your procedure. WHAT TO EXPECT AFTER THE PROCEDURE After your procedure, it is typical to have the following:  Pain and swelling in your knee.  Constipation.  Difficulty walking. HOME CARE INSTRUCTIONS   Use crutches and do knee exercises as directed by your health care provider.  Apply ice to the injured area:  Put ice in a plastic bag.  Place a towel between your skin and the bag.  Leave the ice on for 15-20 minutes, 3-4 times a day while awake. Do this for the first 2 days.  Rest and raise (elevate) your knee.  Change bandages (dressings) as directed by your health care provider.  Keep the wound dry and clean. The wound may be washed gently with soap and water. Gently blot or dab the wound dry. It is okay to take showers 24-48 hours after surgery. Do not take baths, use swimming pools, or  use hot tubs for 14 days, or as directed by your health care provider.  Only take over-the-counter or prescription medicines for pain, discomfort, or fever as directed by your health care provider.  Continue your normal diet as directed by your health care provider.  Do not lift anything more than 10 pounds or play contact sports for 3 weeks, or as directed by your health care provider.  If a brace was applied, use as directed by your health care provider.  Your health care provider will help with instructions for rehabilitation of your knee. SEEK MEDICAL CARE IF:   You have increased bleeding (more than a small spot) from the wound.  You have redness, swelling, or increasing pain in the wound.  Yellowish-white fluid (pus) is coming from your wound. SEEK IMMEDIATE MEDICAL CARE IF:   You develop a rash.  You have a fever or persistent symptoms for more than 2-3 days.  You have difficulty breathing.  You have increasing pain with movement of the knee. MAKE SURE YOU:   Understand these instructions.  Will watch your condition.  Will  get help right away if you are not doing well or get worse.   This information is not intended to replace advice given to you by your health care provider. Make sure you discuss any questions you have with your health care provider.   Document Released: 10/18/2004 Document Revised: 12/01/2012 Document Reviewed: 09/07/2012 Elsevier Interactive Patient Education 2016 [employer] Inc[employer] PATIENT INSTRUCTIONS POST-ANESTHESIA  IMMEDIATELY FOLLOWING SURGERY:  Do not drive or operate machinery for the first twenty four hours after surgery.  Do not make any important decisions for twenty four hours after surgery or while taking narcotic pain medications or sedatives.  If you develop intractable nausea and vomiting or a severe headache please notify your doctor immediately.  FOLLOW-UP:  Please make an appointment with your surgeon as instructed. You do not need  to follow up with anesthesia unless specifically instructed to do so.  WOUND CARE INSTRUCTIONS (if applicable):  Keep a dry clean dressing on the anesthesia/puncture wound site if there is drainage.  Once the wound has quit draining you may leave it open to air.  Generally you should leave the bandage intact for twenty four hours unless there is drainage.  If the epidural site drains for more than 36-48 hours please call the anesthesia department.  QUESTIONS?:  Please feel free to call your physician or the hospital operator if you have any questions, and they will be happy to assist you.

## 2016-01-07 NOTE — Telephone Encounter (Signed)
Pre op and surgery cancelled until coverage confirmed  Called patient, not available

## 2016-01-08 ENCOUNTER — Encounter (HOSPITAL_COMMUNITY): Payer: Self-pay

## 2016-01-08 ENCOUNTER — Encounter (HOSPITAL_COMMUNITY)
Admission: RE | Admit: 2016-01-08 | Discharge: 2016-01-08 | Disposition: A | Discharge status: Home / Self Care | Payer: Self-pay | Source: Ambulatory Visit | Attending: Orthopedic Surgery | Admitting: Orthopedic Surgery

## 2016-01-08 NOTE — Telephone Encounter (Signed)
Patient aware surgery is cancelled until coverage determined

## 2016-01-10 ENCOUNTER — Ambulatory Visit (HOSPITAL_COMMUNITY)
Admission: RE | Admit: 2016-01-10 | Payer: PRIVATE HEALTH INSURANCE | Source: Ambulatory Visit | Admitting: Orthopedic Surgery

## 2016-01-10 ENCOUNTER — Encounter (HOSPITAL_COMMUNITY): Admission: RE | Payer: Self-pay | Source: Ambulatory Visit

## 2016-01-10 SURGERY — ARTHROSCOPY, KNEE, WITH MEDIAL MENISCECTOMY
Anesthesia: General | Laterality: Right

## 2016-01-14 ENCOUNTER — Ambulatory Visit: Payer: Self-pay

## 2016-01-16 ENCOUNTER — Ambulatory Visit: Payer: PRIVATE HEALTH INSURANCE | Admitting: Orthopedic Surgery

## 2016-01-23 ENCOUNTER — Encounter: Payer: Self-pay | Admitting: Physician Assistant

## 2016-01-23 ENCOUNTER — Ambulatory Visit: Payer: Self-pay | Admitting: Physician Assistant

## 2016-01-23 ENCOUNTER — Other Ambulatory Visit: Payer: Self-pay | Admitting: Physician Assistant

## 2016-01-23 VITALS — BP 114/64 | HR 92 | Temp 97.7°F | Wt 247.0 lb

## 2016-01-23 DIAGNOSIS — M25561 Pain in right knee: Secondary | ICD-10-CM

## 2016-01-23 DIAGNOSIS — Z1211 Encounter for screening for malignant neoplasm of colon: Secondary | ICD-10-CM

## 2016-01-23 DIAGNOSIS — F17210 Nicotine dependence, cigarettes, uncomplicated: Secondary | ICD-10-CM

## 2016-01-23 NOTE — Progress Notes (Signed)
BP 114/64 (BP Location: Left Arm, Patient Position: Sitting, Cuff Size: Normal)   Pulse 92   Temp 97.7 F (36.5 C)   Wt 247 lb (112 kg)   SpO2 99%   BMI 45.18 kg/m    Subjective:    Patient ID: Michele Romero, female    DOB: 05/27/1960, 55 y.o.   MRN: [id number]  HPI: Michele Romero is a 55 y.o. female presenting on 01/23/2016 for Follow-up   HPI   Pt was scheduled for knee surgery but cancelled because her [employer] discount expired.  She turned in a new one about 2 weeks ago.  She is doing well otherwise.  Relevant past medical, surgical, family and social history reviewed and updated as indicated. Interim medical history since our last visit reviewed. Allergies and medications reviewed and updated.   Current Outpatient Prescriptions:  .  IBUPROFEN PO, Take 2 tablets by mouth at bedtime., Disp: , Rfl:    Review of Systems  Constitutional: Negative for appetite change, chills, diaphoresis, fatigue, fever and unexpected weight change.  HENT: Positive for tinnitus. Negative for congestion, drooling, ear pain, facial swelling, hearing loss, mouth sores, sneezing, sore throat, trouble swallowing and voice change.   Eyes: Positive for itching. Negative for pain, discharge, redness and visual disturbance.  Respiratory: Positive for wheezing. Negative for cough, choking and shortness of breath.   Cardiovascular: Positive for leg swelling. Negative for chest pain and palpitations.  Gastrointestinal: Negative for abdominal pain, blood in stool, constipation, diarrhea and vomiting.  Endocrine: Negative for cold intolerance, heat intolerance and polydipsia.  Genitourinary: Negative for decreased urine volume, dysuria and hematuria.  Musculoskeletal: Positive for arthralgias, back pain and gait problem. Negative for joint swelling.  Skin: Negative for rash.  Allergic/Immunologic: Positive for environmental allergies.  Neurological: Positive for headaches. Negative for seizures,  syncope and light-headedness.  Hematological: Negative for adenopathy.  Psychiatric/Behavioral: Negative for agitation, dysphoric mood and suicidal ideas. The patient is not nervous/anxious.     Per HPI unless specifically indicated above     Objective:    BP 114/64 (BP Location: Left Arm, Patient Position: Sitting, Cuff Size: Normal)   Pulse 92   Temp 97.7 F (36.5 C)   Wt 247 lb (112 kg)   SpO2 99%   BMI 45.18 kg/m   Wt Readings from Last 3 Encounters:  01/23/16 247 lb (112 kg)  12/25/15 240 lb (108.9 kg)  12/12/15 245 lb (111.1 kg)    Physical Exam  Constitutional: She is oriented to person, place, and time. She appears well-developed and well-nourished.  HENT:  Head: Normocephalic and atraumatic.  Neck: Neck supple.  Cardiovascular: Normal rate and regular rhythm.   Pulmonary/Chest: Effort normal and breath sounds normal.  Abdominal: Soft. Bowel sounds are normal. She exhibits no mass. There is no hepatosplenomegaly. There is no tenderness.  Musculoskeletal: She exhibits edema (RLE).  Lymphadenopathy:    She has no cervical adenopathy.  Neurological: She is alert and oriented to person, place, and time.  Skin: Skin is warm and dry.  Psychiatric: She has a normal mood and affect. Her behavior is normal.  Vitals reviewed.       Assessment & Plan:    Encounter Diagnoses  Name Primary?  . Right knee pain, unspecified chronicity Yes  . Cigarette nicotine dependence without complication   . Morbid obesity ([place])   . Special screening for malignant neoplasms, colon     -ifobt given for colon cancer screening -counseled pt on smoking cessation -pt  to continue with orthopedics for her knee -F/u April.  RTO sooner prn

## 2016-01-24 LAB — IFOBT (OCCULT BLOOD): IFOBT: NEGATIVE

## 2016-01-31 ENCOUNTER — Telehealth: Payer: Self-pay | Admitting: Orthopedic Surgery

## 2016-01-31 NOTE — Telephone Encounter (Signed)
Patient called to let us know she has gotten her [facility] Discount letter. I asked if she would bring it by the office so we can scan it into her chart. I told her that we would speak with Dr. Aline Brochure to see if she would need another office visit, to discuss surgery.

## 2016-04-14 HISTORY — PX: KNEE ARTHROPLASTY: SHX992

## 2016-07-23 ENCOUNTER — Ambulatory Visit: Payer: Self-pay | Admitting: Physician Assistant

## 2016-07-23 ENCOUNTER — Encounter: Payer: Self-pay | Admitting: Physician Assistant

## 2016-07-23 DIAGNOSIS — M25561 Pain in right knee: Secondary | ICD-10-CM

## 2016-07-23 NOTE — Progress Notes (Signed)
BP 116/66 (BP Location: Left Arm, Patient Position: Sitting, Cuff Size: Large)   Pulse 86   Temp 97.7 F (36.5 C)   Ht 5\' 2"  (1.575 m)   Wt 242 lb 4 oz (109.9 kg)   SpO2 97%   BMI 44.31 kg/m    Subjective:    Patient ID: Michele Romero, female    DOB: August 11, 1960, 56 y.o.   MRN: [id number]  HPI: Michele Romero is a 56 y.o. female presenting on 07/23/2016 for Follow-up   HPI   Pt says she never re-submitted [employer] discount application (for knee surgery).    She is doing well besides her knee.  She requests vaginal yeast treatment.    She hasn't smoked in 3 wk  Pt states last PAP was 3 year ago at [facility].   Relevant past medical, surgical, family and social history reviewed and updated as indicated. Interim medical history since our last visit reviewed. Allergies and medications reviewed and updated.   Current Outpatient Prescriptions:  .  IBUPROFEN PO, Take 400 mg by mouth at bedtime as needed. , Disp: , Rfl:    Review of Systems  Constitutional: Positive for diaphoresis. Negative for appetite change, chills, fatigue, fever and unexpected weight change.  HENT: Positive for sore throat. Negative for congestion, drooling, ear pain, facial swelling, hearing loss, mouth sores, sneezing, trouble swallowing and voice change.   Eyes: Positive for itching. Negative for pain, discharge, redness and visual disturbance.  Respiratory: Positive for cough. Negative for choking, shortness of breath and wheezing.   Cardiovascular: Positive for leg swelling. Negative for chest pain and palpitations.  Gastrointestinal: Negative for abdominal pain, blood in stool, constipation, diarrhea and vomiting.  Endocrine: Negative for cold intolerance, heat intolerance and polydipsia.  Genitourinary: Negative for decreased urine volume, dysuria and hematuria.  Musculoskeletal: Positive for arthralgias, back pain and gait problem.  Skin: Negative for rash.  Allergic/Immunologic: Negative for  environmental allergies.  Neurological: Negative for seizures, syncope, light-headedness and headaches.  Hematological: Negative for adenopathy.  Psychiatric/Behavioral: Negative for agitation, dysphoric mood and suicidal ideas. The patient is not nervous/anxious.     Per HPI unless specifically indicated above     Objective:    BP 116/66 (BP Location: Left Arm, Patient Position: Sitting, Cuff Size: Large)   Pulse 86   Temp 97.7 F (36.5 C)   Ht 5\' 2"  (1.575 m)   Wt 242 lb 4 oz (109.9 kg)   SpO2 97%   BMI 44.31 kg/m   Wt Readings from Last 3 Encounters:  07/23/16 242 lb 4 oz (109.9 kg)  01/23/16 247 lb (112 kg)  12/25/15 240 lb (108.9 kg)    Physical Exam  Constitutional: She is oriented to person, place, and time. She appears well-developed and well-nourished.  HENT:  Head: Normocephalic and atraumatic.  Neck: Neck supple.  Cardiovascular: Normal rate and regular rhythm.   Pulmonary/Chest: Effort normal and breath sounds normal.  Abdominal: Soft. Bowel sounds are normal. She exhibits no mass. There is no hepatosplenomegaly. There is no tenderness.  Musculoskeletal: She exhibits no edema.  Lymphadenopathy:    She has no cervical adenopathy.  Neurological: She is alert and oriented to person, place, and time.  Skin: Skin is warm and dry.  Psychiatric: She has a normal mood and affect. Her behavior is normal.  Vitals reviewed.   Results for orders placed or performed in visit on 01/23/16  IFOBT POC (occult bld, rslt in office)  Result Value Ref Range  IFOBT Negative       Assessment & Plan:   Encounter Diagnoses  Name Primary?  . Right knee pain, unspecified chronicity   . Morbid obesity ([place]) Yes     -Requested PAP report from [facility] -pt to get mammogram in may -Gave [employer] discount application.  She is told to call orthopedics for follow up after she gets it turned in -congratulations on stopping smoking !  Keep up good work! -pt is given sample monistat 7  day.  She it to RTO if she continues to have symptoms after treatment -f/u 6 months.  RTO sooner prn

## 2016-09-17 ENCOUNTER — Encounter: Payer: Self-pay | Admitting: Physician Assistant

## 2016-09-25 ENCOUNTER — Ambulatory Visit: Payer: Self-pay | Admitting: Physician Assistant

## 2016-09-25 ENCOUNTER — Encounter: Payer: Self-pay | Admitting: Physician Assistant

## 2016-09-25 VITALS — BP 110/70 | HR 94 | Temp 97.9°F | Ht 62.0 in | Wt 239.0 lb

## 2016-09-25 DIAGNOSIS — M25561 Pain in right knee: Secondary | ICD-10-CM

## 2016-09-25 DIAGNOSIS — Z1239 Encounter for other screening for malignant neoplasm of breast: Secondary | ICD-10-CM

## 2016-09-25 MED ORDER — DICLOFENAC SODIUM 75 MG PO TBEC: 75.0000 mg | DELAYED_RELEASE_TABLET | Freq: Two times a day (BID) | ORAL | 1 refills | Status: DC | PRN

## 2016-09-25 NOTE — Progress Notes (Signed)
   BP 110/70 (BP Location: Left Arm, Patient Position: Sitting, Cuff Size: Normal)   Pulse 94   Temp 97.9 F (36.6 C)   Ht 5\' 2"  (1.575 m)   Wt 239 lb (108.4 kg)   SpO2 97%   BMI 43.71 kg/m    Subjective:    Patient ID: Michele Romero, female    DOB: [birth year], 56 y.o.   MRN: [id number]  HPI: Michele Romero is a 55 y.o. female presenting on 09/25/2016 for Knee Pain (for about 2 weeks pt states feels like getting worse.)   HPI   Chief Complaint  Patient presents with  . Knee Pain    for about 2 weeks pt states feels like getting worse.     Pt says she turned in her [employer] discount application.  She says about 2 weeks ago.  She says she is going back next week to turn in more papers.     We have been trying to get pt to orthopedist for over a year.  Discussed with her that she needs to follow through with getting her [employer] discount so that this can happen.  She states understanding.  Relevant past medical, surgical, family and social history reviewed and updated as indicated. Interim medical history since our last visit reviewed. Allergies and medications reviewed and updated.  Review of Systems  Per HPI unless specifically indicated above     Objective:    BP 110/70 (BP Location: Left Arm, Patient Position: Sitting, Cuff Size: Normal)   Pulse 94   Temp 97.9 F (36.6 C)   Ht 5\' 2"  (1.575 m)   Wt 239 lb (108.4 kg)   SpO2 97%   BMI 43.71 kg/m   Wt Readings from Last 3 Encounters:  09/25/16 239 lb (108.4 kg)  07/23/16 242 lb 4 oz (109.9 kg)  01/23/16 247 lb (112 kg)    Physical Exam  Constitutional: She is oriented to person, place, and time. She appears well-developed and well-nourished.  HENT:  Head: Normocephalic and atraumatic.  Pulmonary/Chest: Effort normal. No respiratory distress.  Musculoskeletal: She exhibits no edema.       Right knee: She exhibits normal range of motion, no swelling, no effusion, no LCL laxity and no MCL laxity. Tenderness found.   Neurological: She is alert and oriented to person, place, and time.  Skin: Skin is warm and dry.  Psychiatric: She has a normal mood and affect. Her behavior is normal.  Nursing note and vitals reviewed.       Assessment & Plan:   Encounter Diagnoses  Name Primary?  . Right knee pain, unspecified chronicity Yes  . Screening for breast cancer     -Refer again to orthopedics for R knee pain -reviewed xray r knee -encouraged pt to ice the knee 10-20 minutes 3-4 times daily -diclofenac prn pain -pt to follow through with getting her [employer] discount -order screening mammogram -pt to follow up ocotber as scheduled. RTO sooner prn

## 2016-10-22 ENCOUNTER — Other Ambulatory Visit: Payer: Self-pay | Admitting: Physician Assistant

## 2016-10-22 DIAGNOSIS — Z1231 Encounter for screening mammogram for malignant neoplasm of breast: Secondary | ICD-10-CM

## 2016-11-17 ENCOUNTER — Encounter: Payer: Self-pay | Admitting: Orthopaedic Surgery

## 2017-01-20 ENCOUNTER — Ambulatory Visit: Payer: Self-pay | Admitting: Physician Assistant

## 2017-01-20 ENCOUNTER — Other Ambulatory Visit: Payer: Self-pay | Admitting: Physician Assistant

## 2017-01-20 ENCOUNTER — Encounter: Payer: Self-pay | Admitting: Physician Assistant

## 2017-01-20 VITALS — BP 128/84 | HR 74 | Temp 97.5°F

## 2017-01-20 DIAGNOSIS — M25561 Pain in right knee: Secondary | ICD-10-CM

## 2017-01-20 DIAGNOSIS — S83231A Complex tear of medial meniscus, current injury, right knee, initial encounter: Secondary | ICD-10-CM

## 2017-01-20 DIAGNOSIS — S83231D Complex tear of medial meniscus, current injury, right knee, subsequent encounter: Secondary | ICD-10-CM

## 2017-01-20 DIAGNOSIS — Z1211 Encounter for screening for malignant neoplasm of colon: Secondary | ICD-10-CM

## 2017-01-20 DIAGNOSIS — R7303 Prediabetes: Secondary | ICD-10-CM

## 2017-01-20 DIAGNOSIS — Z1239 Encounter for other screening for malignant neoplasm of breast: Secondary | ICD-10-CM

## 2017-01-20 DIAGNOSIS — J449 Chronic obstructive pulmonary disease, unspecified: Secondary | ICD-10-CM

## 2017-01-20 DIAGNOSIS — Z13220 Encounter for screening for lipoid disorders: Secondary | ICD-10-CM

## 2017-01-20 DIAGNOSIS — M1711 Unilateral primary osteoarthritis, right knee: Secondary | ICD-10-CM | POA: Insufficient documentation

## 2017-01-20 DIAGNOSIS — R936 Abnormal findings on diagnostic imaging of limbs: Secondary | ICD-10-CM

## 2017-01-20 HISTORY — DX: Complex tear of medial meniscus, current injury, right knee, initial encounter: S83.231A

## 2017-01-20 MED ORDER — ALBUTEROL SULFATE HFA 108 (90 BASE) MCG/ACT IN AERS: 2.0000 | INHALATION_SPRAY | Freq: Four times a day (QID) | RESPIRATORY_TRACT | 1 refills | Status: DC | PRN

## 2017-01-20 MED ORDER — DICLOFENAC SODIUM 75 MG PO TBEC: 75.0000 mg | DELAYED_RELEASE_TABLET | Freq: Two times a day (BID) | ORAL | 1 refills | Status: DC | PRN

## 2017-01-20 NOTE — Progress Notes (Signed)
BP 128/84   Pulse 74   Temp (!) 97.5 F (36.4 C)   SpO2 98%    Subjective:    Patient ID: Michele Romero, female    DOB: 18-Sep-1960, 56 y.o.   MRN: [id number]  HPI: Michele Romero is a 56 y.o. female presenting on 01/20/2017 for Follow-up   HPI   Pt has not gotten knee surgery.  She got denied for [facility] Discount and is planning to reappy next month.   Pt states some short-winded started about a month ago.  Happens with exertion.  No chest pain.   Pt is still staying off smoking!  Relevant past medical, surgical, family and social history reviewed and updated as indicated. Interim medical history since our last visit reviewed. Allergies and medications reviewed and updated.   Current Outpatient Prescriptions:  .  IBUPROFEN PO, Take 400 mg by mouth at bedtime as needed. , Disp: , Rfl:  .  diclofenac (VOLTAREN) 75 MG EC tablet, Take 1 tablet (75 mg total) by mouth 2 (two) times daily as needed. (Patient not taking: Reported on 01/20/2017), Disp: 60 tablet, Rfl: 1   Review of Systems  Constitutional: Negative for appetite change, chills, diaphoresis, fatigue, fever and unexpected weight change.  HENT: Positive for sneezing. Negative for congestion, drooling, ear pain, facial swelling, hearing loss, mouth sores, sore throat, trouble swallowing and voice change.   Eyes: Negative for pain, discharge, redness, itching and visual disturbance.  Respiratory: Positive for cough, chest tightness, shortness of breath and wheezing. Negative for choking.   Cardiovascular: Positive for leg swelling. Negative for chest pain and palpitations.  Gastrointestinal: Negative for abdominal pain, blood in stool, constipation, diarrhea and vomiting.  Endocrine: Negative for cold intolerance, heat intolerance and polydipsia.  Genitourinary: Negative for decreased urine volume, dysuria and hematuria.  Musculoskeletal: Positive for arthralgias and back pain. Negative for gait problem.  Skin: Negative  for rash.  Allergic/Immunologic: Negative for environmental allergies.  Neurological: Positive for headaches. Negative for seizures, syncope and light-headedness.  Hematological: Negative for adenopathy.  Psychiatric/Behavioral: Negative for agitation, dysphoric mood and suicidal ideas. The patient is not nervous/anxious.     Per HPI unless specifically indicated above     Objective:    BP 128/84   Pulse 74   Temp (!) 97.5 F (36.4 C)   SpO2 98%   Wt Readings from Last 3 Encounters:  09/25/16 239 lb (108.4 kg)  07/23/16 242 lb 4 oz (109.9 kg)  01/23/16 247 lb (112 kg)    Physical Exam  Constitutional: She is oriented to person, place, and time. She appears well-developed and well-nourished.  HENT:  Head: Normocephalic and atraumatic.  Neck: Neck supple.  Cardiovascular: Normal rate and regular rhythm.   Pulmonary/Chest: Effort normal and breath sounds normal.  Abdominal: Soft. Bowel sounds are normal. She exhibits no mass. There is no hepatosplenomegaly. There is no tenderness.  Musculoskeletal: She exhibits edema (RLE).       Right knee: She exhibits decreased range of motion and swelling. Tenderness found.  Lymphadenopathy:    She has no cervical adenopathy.  Neurological: She is alert and oriented to person, place, and time.  Skin: Skin is warm and dry.  Psychiatric: She has a normal mood and affect. Her behavior is normal.  Vitals reviewed.       Assessment & Plan:   Encounter Diagnoses  Name Primary?  . Right knee pain, unspecified chronicity Yes  . Chronic obstructive pulmonary disease, unspecified COPD type ([place])   .  Screening for breast cancer   . Special screening for malignant neoplasms, colon   . Screening cholesterol level   . Osteoarthritis of right knee, unspecified osteoarthritis type   . Complex tear of medial meniscus of right knee, unspecified whether old or current tear, subsequent encounter   . Abnormal MRI, knee   . Prediabetes     -will  get pt signed up for medassist and order her an inhaler for her SOB -will Check cmp, lipids -pt given ifobt for colon cancer screening -screening mammogram ordered -will refer to [facility] for knee and pt can discuss financial assistance there since she has had difficulty getting it here -pt will follow up 2 months with pap at that appointment

## 2017-01-21 ENCOUNTER — Ambulatory Visit: Payer: Self-pay | Admitting: Physician Assistant

## 2017-01-28 LAB — IFOBT (OCCULT BLOOD): IFOBT: NEGATIVE

## 2017-03-24 ENCOUNTER — Ambulatory Visit: Payer: Self-pay | Admitting: Physician Assistant

## 2017-03-30 ENCOUNTER — Encounter: Payer: Self-pay | Admitting: Physician Assistant

## 2017-03-30 ENCOUNTER — Ambulatory Visit: Payer: Self-pay | Admitting: Physician Assistant

## 2017-03-30 VITALS — BP 110/80 | HR 61 | Wt 226.0 lb

## 2017-03-30 DIAGNOSIS — Z91199 Patient's noncompliance with other medical treatment and regimen due to unspecified reason: Secondary | ICD-10-CM

## 2017-03-30 DIAGNOSIS — J449 Chronic obstructive pulmonary disease, unspecified: Secondary | ICD-10-CM

## 2017-03-30 DIAGNOSIS — Z9119 Patient's noncompliance with other medical treatment and regimen: Secondary | ICD-10-CM

## 2017-03-30 DIAGNOSIS — M25561 Pain in right knee: Secondary | ICD-10-CM

## 2017-03-30 DIAGNOSIS — Z124 Encounter for screening for malignant neoplasm of cervix: Secondary | ICD-10-CM

## 2017-03-30 DIAGNOSIS — F17210 Nicotine dependence, cigarettes, uncomplicated: Secondary | ICD-10-CM

## 2017-03-30 NOTE — Progress Notes (Signed)
BP 110/80   Pulse 61   Wt 226 lb (102.5 kg)   LMP 04/14/2010   SpO2 98%   BMI 41.34 kg/m    Subjective:    Patient ID: Michele Romero, female    DOB: 1960-10-06, 56 y.o.   MRN: [id number]  HPI: Michele Romero is a 56 y.o. female presenting on 03/30/2017 for No chief complaint on file.   HPI   -Pt did not get labs done.  -LMP 6 years ago -Pt states breathing better wit hinhaler -Pt has not heard: from [facility] re: orthopedics referral -Pt is still smoking about 5/day  Relevant past medical, surgical, family and social history reviewed and updated as indicated. Interim medical history since our last visit reviewed. Allergies and medications reviewed and updated.   Current Outpatient Medications:  .  albuterol (PROVENTIL HFA;VENTOLIN HFA) 108 (90 Base) MCG/ACT inhaler, Inhale 2 puffs into the lungs every 6 (six) hours as needed for wheezing or shortness of breath., Disp: 3 Inhaler, Rfl: 1 .  diclofenac (VOLTAREN) 75 MG EC tablet, Take 1 tablet (75 mg total) by mouth 2 (two) times daily as needed., Disp: 60 tablet, Rfl: 1 .  IBUPROFEN PO, Take 400 mg by mouth at bedtime as needed. , Disp: , Rfl:    Review of Systems  Constitutional: Positive for diaphoresis. Negative for appetite change, chills, fatigue, fever and unexpected weight change.  HENT: Negative for congestion, dental problem, drooling, ear pain, facial swelling, hearing loss, mouth sores, sneezing, sore throat, trouble swallowing and voice change.   Eyes: Negative for pain, discharge, redness, itching and visual disturbance.  Respiratory: Negative for cough, choking, shortness of breath and wheezing.   Cardiovascular: Negative for chest pain, palpitations and leg swelling.  Gastrointestinal: Negative for abdominal pain, blood in stool, constipation, diarrhea and vomiting.  Endocrine: Negative for cold intolerance, heat intolerance and polydipsia.  Genitourinary: Negative for decreased urine volume, dysuria and  hematuria.  Musculoskeletal: Negative for arthralgias, back pain and gait problem.  Skin: Negative for rash.  Allergic/Immunologic: Negative for environmental allergies.  Neurological: Negative for seizures, syncope, light-headedness and headaches.  Hematological: Negative for adenopathy.  Psychiatric/Behavioral: Negative for agitation, dysphoric mood and suicidal ideas. The patient is not nervous/anxious.     Per HPI unless specifically indicated above     Objective:    BP 110/80   Pulse 61   Wt 226 lb (102.5 kg)   LMP 04/14/2010   SpO2 98%   BMI 41.34 kg/m   Wt Readings from Last 3 Encounters:  03/30/17 226 lb (102.5 kg)  09/25/16 239 lb (108.4 kg)  07/23/16 242 lb 4 oz (109.9 kg)    Physical Exam  Constitutional: She is oriented to person, place, and time. She appears well-developed and well-nourished.  HENT:  Head: Normocephalic and atraumatic.  Neck: Neck supple.  Cardiovascular: Normal rate and regular rhythm.  Pulmonary/Chest: Effort normal and breath sounds normal.  Breast exam normal  Abdominal: Soft. Bowel sounds are normal. She exhibits no mass. There is no hepatosplenomegaly. There is no tenderness. There is no rebound and no guarding.  Genitourinary: Vagina normal and uterus normal. No breast swelling, tenderness, discharge or bleeding. There is no rash, tenderness or lesion on the right labia. There is no rash, tenderness or lesion on the left labia. Cervix exhibits no motion tenderness, no discharge and no friability. Right adnexum displays no mass, no tenderness and no fullness. Left adnexum displays no mass, no tenderness and no fullness.  Genitourinary Comments: (  nurse Illene Bolus assisted)  Musculoskeletal: She exhibits no edema.  Lymphadenopathy:    She has no cervical adenopathy.  Neurological: She is alert and oriented to person, place, and time.  Skin: Skin is warm and dry.  Psychiatric: She has a normal mood and affect. Her behavior is normal.  Nursing  note and vitals reviewed.       Assessment & Plan:    Encounter Diagnoses  Name Primary?  . Routine Papanicolaou smear Yes  . Right knee pain, unspecified chronicity   . Chronic obstructive pulmonary disease, unspecified COPD type ([place])   . Cigarette nicotine dependence without complication   . Morbid obesity ([place])   . Personal history of noncompliance with medical treatment, presenting hazards to health      -pt reminded to get labs drawn -nurse will check on orthopedics referral -pt counseled on smoking cessation -pt to follow up 3 months. RTO sooner prn

## 2017-03-31 ENCOUNTER — Other Ambulatory Visit: Payer: Self-pay

## 2017-03-31 ENCOUNTER — Encounter (HOSPITAL_COMMUNITY): Payer: Self-pay | Admitting: Emergency Medicine

## 2017-03-31 ENCOUNTER — Emergency Department (HOSPITAL_COMMUNITY)
Admission: EM | Admit: 2017-03-31 | Discharge: 2017-03-31 | Disposition: A | Discharge status: Home / Self Care | Payer: Self-pay | Attending: Emergency Medicine | Admitting: Emergency Medicine

## 2017-03-31 ENCOUNTER — Emergency Department (HOSPITAL_COMMUNITY): Payer: Self-pay

## 2017-03-31 DIAGNOSIS — F17210 Nicotine dependence, cigarettes, uncomplicated: Secondary | ICD-10-CM | POA: Insufficient documentation

## 2017-03-31 DIAGNOSIS — M1711 Unilateral primary osteoarthritis, right knee: Secondary | ICD-10-CM | POA: Insufficient documentation

## 2017-03-31 DIAGNOSIS — M5431 Sciatica, right side: Secondary | ICD-10-CM | POA: Insufficient documentation

## 2017-03-31 MED ORDER — METHYLPREDNISOLONE 4 MG PO TBPK: ORAL_TABLET | ORAL | 0 refills | Status: DC

## 2017-03-31 NOTE — ED Provider Notes (Signed)
[facility] EMERGENCY DEPARTMENT Provider Note   CSN: [id number] Arrival date & time: 03/31/17  1025     History   Chief Complaint Chief Complaint  Patient presents with  . Leg Pain    HPI Michele Romero is a 56 y.o. female with no significant a past medical history, who presents to ED for evaluation of knee pain radiating up her thigh and to the right side of her back for the past 2 weeks.  She states that she has had intermittent right knee pain for the past several years due to her arthritis which is usually controlled with her ibuprofen.  However, she states that she has never had radiation of the pain up to her thigh and back.  States that pain is worse in the morning and alleviates with movement.  She denies any knee or back surgeries in the past.  Denies any numbness of extremities, urinary incontinence, history of cancer, history of IV drug use, fevers, injuries or falls.  HPI  Past Medical History:  Diagnosis Date  . Bronchitis   . Migraines     Patient Active Problem List   Diagnosis Date Noted  . Prediabetes 01/20/2017  . Osteoarthritis of right knee 01/20/2017  . Complex tear of medial meniscus of right knee 01/20/2017  . Right knee pain 06/25/2015  . Ganglion cyst of wrist 06/25/2015  . Cigarette nicotine dependence without complication [id number]/[id number]  . Morbid obesity ([place]) 06/25/2015    Past Surgical History:  Procedure Laterality Date  . APPENDECTOMY    . CHOLECYSTECTOMY    . KNEE ARTHROPLASTY     left knee  . TUBAL LIGATION  1987    OB History    Gravida Para Term Preterm AB Living   3 2 2   1 2    SAB TAB Ectopic Multiple Live Births   1               Home Medications    Prior to Admission medications   Medication Sig Start Date End Date Taking? Authorizing Provider  albuterol (PROVENTIL HFA;VENTOLIN HFA) 108 (90 Base) MCG/ACT inhaler Inhale 2 puffs into the lungs every 6 (six) hours as needed for wheezing or shortness of breath. 01/20/17    Soyla Dryer, PA-C  diclofenac (VOLTAREN) 75 MG EC tablet Take 1 tablet (75 mg total) by mouth 2 (two) times daily as needed. 01/20/17   Soyla Dryer, PA-C  IBUPROFEN PO Take 400 mg by mouth at bedtime as needed.     [provider]  methylPREDNISolone (MEDROL DOSEPAK) 4 MG TBPK tablet Taper over 6 days. 03/31/17   Delia Heady, PA-C    Family History Family History  Problem Relation Age of Onset  . Diabetes Mother   . Hypertension Mother   . Heart disease Mother        CHF  . Stroke Mother   . Heart disease Father     Social History Social History   Tobacco Use  . Smoking status: Current Every Day Smoker    Packs/day: 0.25    Years: 30.00    Pack years: 7.50    Types: Cigarettes    Last attempt to quit: 09/20/2016    Years since quitting: 0.5  . Smokeless tobacco: Never Used  Substance Use Topics  . Alcohol use: No  . Drug use: Yes    Frequency: 6.0 times per week    Types: Marijuana     Allergies   Codeine   Review of Systems  Review of Systems  Constitutional: Negative for chills and fever.  Gastrointestinal: Negative for nausea and vomiting.  Musculoskeletal: Positive for arthralgias, back pain and myalgias.  Skin: Negative for rash and wound.  Neurological: Negative for weakness and numbness.     Physical Exam Updated Vital Signs BP (!) 129/93 (BP Location: Right Arm)   Pulse 75   Temp 98.3 F (36.8 C) (Oral)   Resp 18   Ht 5' 2.5" (1.588 m)   Wt 102.5 kg (226 lb)   LMP 04/14/2010   SpO2 97%   BMI 40.68 kg/m   Physical Exam  Constitutional: She appears well-developed and well-nourished. No distress.  Nontoxic appearing and in no acute distress.  Ambulatory with normal gait.  HENT:  Head: Normocephalic and atraumatic.  Eyes: Conjunctivae and EOM are normal. No scleral icterus.  Neck: Normal range of motion.  Pulmonary/Chest: Effort normal. No respiratory distress.  Musculoskeletal: Normal range of motion. She exhibits  tenderness. She exhibits no edema or deformity.       Arms: Tenderness to palpation of the paraspinal musculature on the right side of the lumbar area.  No tenderness to palpation or edema or erythema noted of the right knee. No midline spinal tenderness present in lumbar, thoracic or cervical spine. No step-off palpated. No visible bruising, edema or temperature change noted. No objective signs of numbness present. No saddle anesthesia. 2+ DP pulses bilaterally. Sensation intact to light touch. Strength 5/5 in bilateral lower extremities.  Neurological: She is alert.  Skin: No rash noted. She is not diaphoretic.  Psychiatric: She has a normal mood and affect.  Nursing note and vitals reviewed.    ED Treatments / Results  Labs (all labs ordered are listed, but only abnormal results are displayed) Labs Reviewed - No data to display  EKG  EKG Interpretation None       Radiology Dg Lumbar Spine Complete  Result Date: 03/31/2017 CLINICAL DATA:  Knee pain.  Back pain.  No known injury. EXAM: LUMBAR SPINE - COMPLETE 4+ VIEW COMPARISON:  10/17/2012. FINDINGS: Lumbar spine numbered as per prior exam. Degenerative changes lumbar spine and both hips. Stable 3 mm anterolisthesis L4 on L5 No acute bony abnormality identified. No evidence fracture. Surgical clips right upper quadrant. IMPRESSION: Diffuse degenerative change lumbar spine. Stable 3 mm anterolisthesis L4 on L5 . No acute abnormality identified. Electronically Signed   By: Marcello Moores  Register   On: 03/31/2017 12:00   Dg Knee Complete 4 Views Right  Result Date: 03/31/2017 CLINICAL DATA:  Anterior knee pain EXAM: RIGHT KNEE - COMPLETE 4+ VIEW COMPARISON:  None. FINDINGS: No acute fracture or dislocation. Severe medial femorotibial compartment joint space narrowing with marginal osteophytes. Widening of the lateral femorotibial compartment with tiny marginal osteophytes. Severe patellofemoral compartment joint space narrowing with marginal  osteophytes. No significant joint effusion. IMPRESSION: 1. Tricompartmental osteoarthritis of the right knee most severe in the medial femorotibial compartment and patellofemoral compartment. Electronically Signed   By: Kathreen Devoid   On: 03/31/2017 11:59    Procedures Procedures (including critical care time)  Medications Ordered in ED Medications - No data to display   Initial Impression / Assessment and Plan / ED Course  I have reviewed the triage vital signs and the nursing notes.  Pertinent labs & imaging results that were available during my care of the patient were reviewed by me and considered in my medical decision making (see chart for details).     Patient presents to ED for evaluation  of right knee pain radiating up to the thigh and right-sided back for the past 2 weeks.  She has had intermittent right knee pain for the past several years due to her arthritis which is usually controlled with her ibuprofen.  No injuries, falls, prior back surgery, numbness in legs, history of cancer, loss of bladder or bowel function.  On physical exam she is overall well-appearing.  She is ambulatory with normal gait here in the ED.  There is mild edema noted around the right knee but no changes in range of motion.  Strength 5/5 in bilateral lower extremities with no signs of saddle anesthesia.  X-ray of the knee showed degenerative changes due to arthritis.  No abnormality noticed in lumbar spine x-ray.  Her symptoms could be due to sciatica rather than acute spinal cord injury or cauda equina as a cause of her back pain.  Will give patient Medrol Dosepak to help with inflammation of muscle and advised her to take Tylenol as needed for osteoarthritis of knee.  Advised patient to follow-up with primary care provider for further evaluation as well as pain clinic.  Patient appears stable for discharge at this time.  Strict return precautions given.  Final Clinical Impressions(s) / ED Diagnoses   Final  diagnoses:  Sciatica of right side  Osteoarthritis of right knee, unspecified osteoarthritis type    ED Discharge Orders        Ordered    methylPREDNISolone (MEDROL DOSEPAK) 4 MG TBPK tablet     03/31/17 1231     Portions of this note were generated with Dragon dictation software. Dictation errors may occur despite best attempts at proofreading.    Delia Heady, PA-C 03/31/17 1237    Davonna Belling, MD 03/31/17 [phone]

## 2017-03-31 NOTE — Discharge Instructions (Signed)
Please read the attached information regarding your condition. Take steroids and taper Dosepak as directed. Take Tylenol in addition to steroids as needed. Apply heating pad and stretch area as tolerated. Return to ED for worsening back pain, numbness in legs, loss of bladder function, injuries or falls.

## 2017-03-31 NOTE — ED Triage Notes (Signed)
PT c/o right leg back radiating from her lower back down her right leg with no new injury x2 weeks. PT ambulatory in triage.

## 2017-04-03 ENCOUNTER — Other Ambulatory Visit (HOSPITAL_COMMUNITY)
Admission: RE | Admit: 2017-04-03 | Discharge: 2017-04-03 | Disposition: A | Discharge status: Home / Self Care | Payer: Self-pay | Source: Ambulatory Visit | Attending: Physician Assistant | Admitting: Physician Assistant

## 2017-04-03 DIAGNOSIS — R7303 Prediabetes: Secondary | ICD-10-CM | POA: Insufficient documentation

## 2017-04-03 DIAGNOSIS — Z13220 Encounter for screening for lipoid disorders: Secondary | ICD-10-CM | POA: Insufficient documentation

## 2017-04-03 LAB — COMPREHENSIVE METABOLIC PANEL
ALT: 12 U/L — AB (ref 14–54)
AST: 18 U/L (ref 15–41)
Albumin: 3.8 g/dL (ref 3.5–5.0)
Alkaline Phosphatase: 69 U/L (ref 38–126)
Anion gap: 11 (ref 5–15)
BUN: 13 mg/dL (ref 6–20)
CO2: 25 mmol/L (ref 22–32)
CREATININE: 0.76 mg/dL (ref 0.44–1.00)
Calcium: 9.4 mg/dL (ref 8.9–10.3)
Chloride: 103 mmol/L (ref 101–111)
GFR calc non Af Amer: >60 mL/min (ref >60)
Glucose, Bld: 114 mg/dL — ABNORMAL HIGH (ref 65–99)
Potassium: 4 mmol/L (ref 3.5–5.1)
Sodium: 139 mmol/L (ref 135–145)
Total Bilirubin: 0.7 mg/dL (ref 0.3–1.2)
Total Protein: 8.2 g/dL — ABNORMAL HIGH (ref 6.5–8.1)

## 2017-04-03 LAB — LIPID PANEL
CHOLESTEROL: 144 mg/dL (ref 0–200)
HDL: 48 mg/dL (ref >40)
LDL CALC: 82 mg/dL (ref 0–99)
TRIGLYCERIDES: 70 mg/dL (ref <150)
Total CHOL/HDL Ratio: 3 RATIO
VLDL: 14 mg/dL (ref 0–40)

## 2017-04-03 LAB — HEMOGLOBIN A1C
HEMOGLOBIN A1C: 5.7 % — AB (ref 4.8–5.6)
Mean Plasma Glucose: 116.89 mg/dL

## 2017-05-18 ENCOUNTER — Encounter: Payer: Self-pay | Admitting: Physician Assistant

## 2017-05-22 IMAGING — MR MR KNEE*R* W/O CM
4 of 6 series · 13 of 40 positions shown · non-contrast
Comparison: Plain films of the left knee 12/12/2015.

CLINICAL DATA: Chronic medial right knee pain with swelling and
weakness. No known injury.

EXAM:
MRI OF THE RIGHT KNEE WITHOUT CONTRAST
TECHNIQUE: Multiplanar, multisequence MR imaging of the knee was performed. No
intravenous contrast was administered.

[Series 3: pdfs axial · axial · 3.0mm · 0.22mm/px · z∈[-54,+17]mm · 3 of 30 slices shown]
[im 5/30]
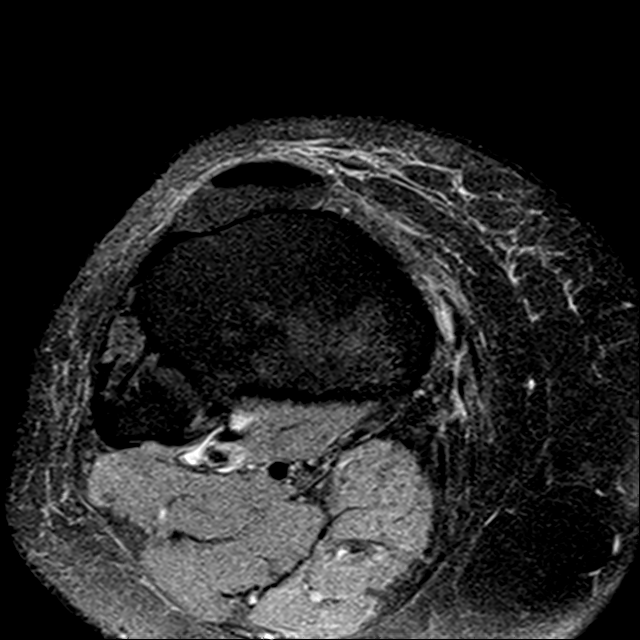
[im 17/30]
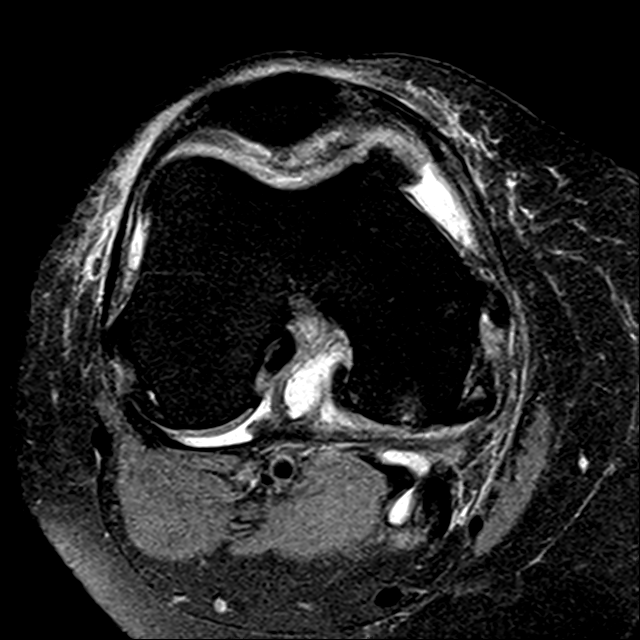
[im 25/30]
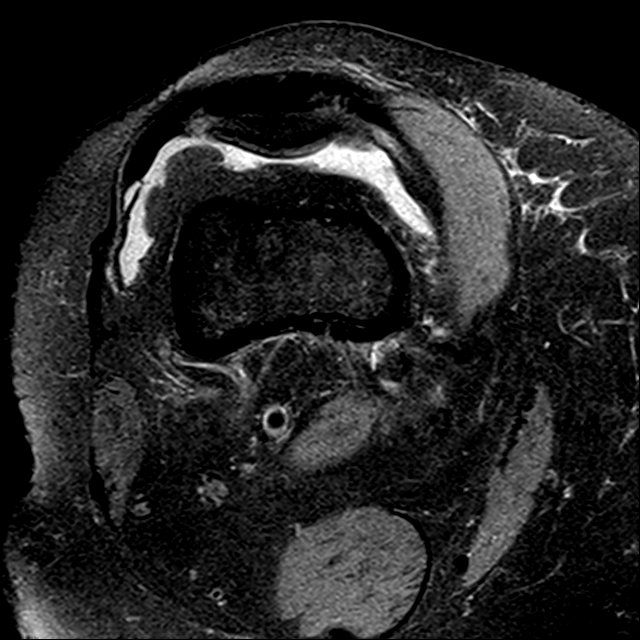

[Series 4: T1 · coronal · 3.0mm · 0.17mm/px · 4 of 28 slices shown]
[im 1/28]
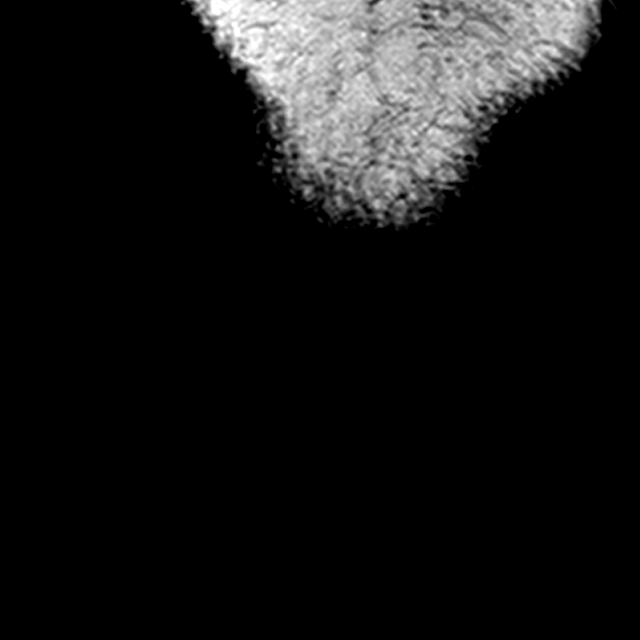
[im 5/28]
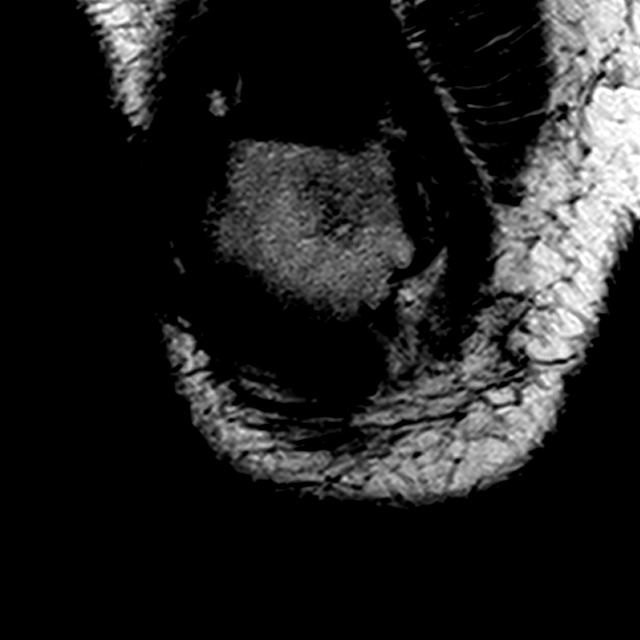
[im 14/28]
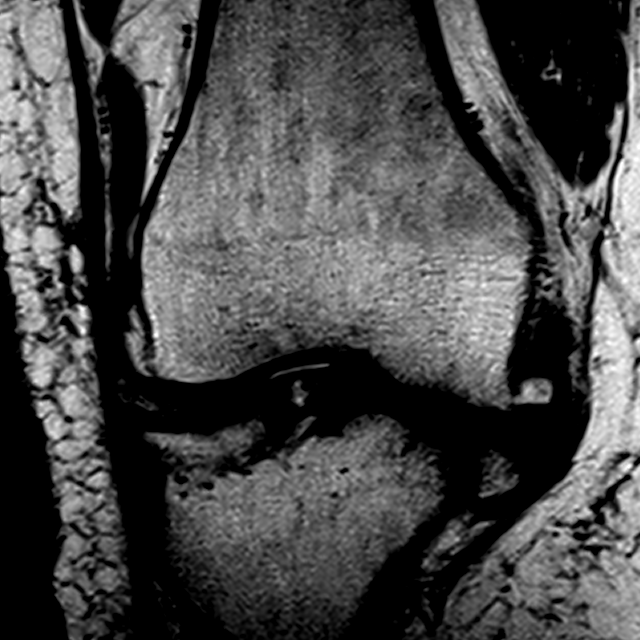
[im 23/28]
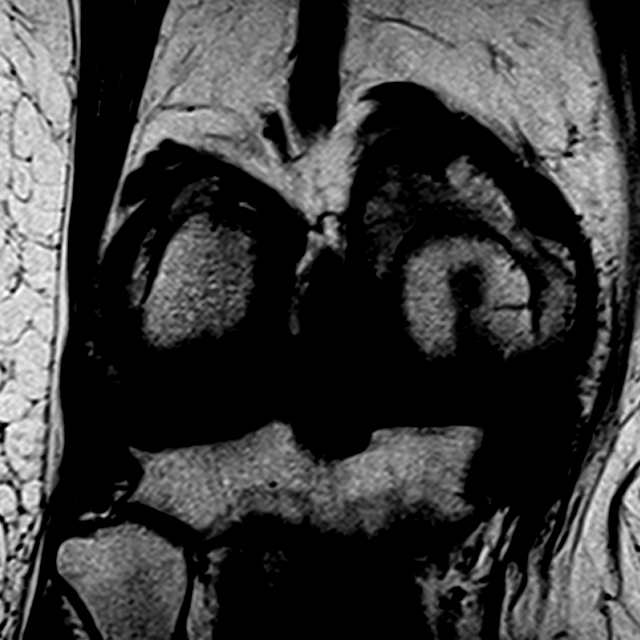

[Series 5: pdfs sag · sagittal · 3.0mm · 0.19mm/px · 3 of 29 slices shown]
[im 5/29]
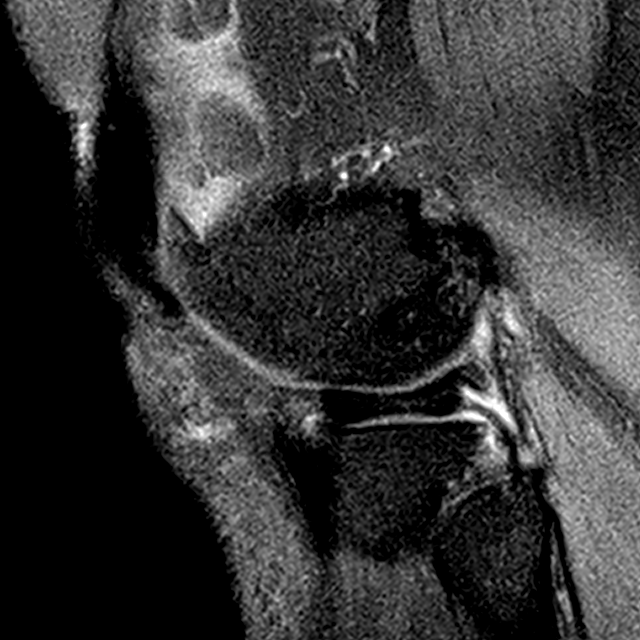
[im 15/29]
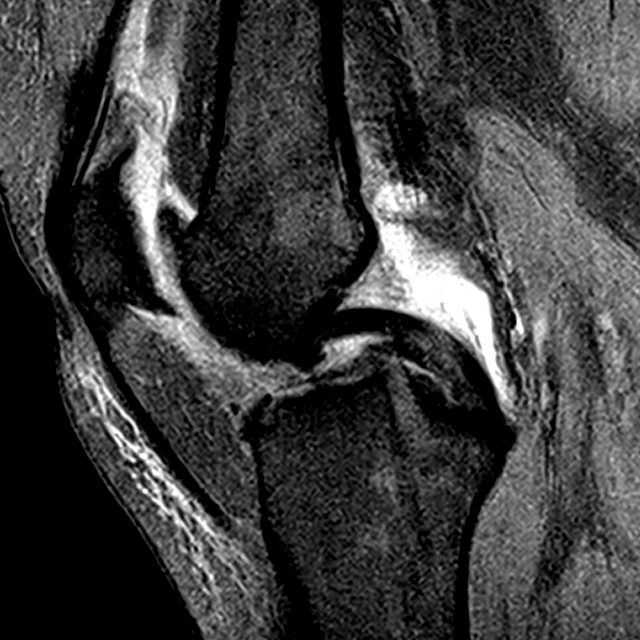
[im 24/29]
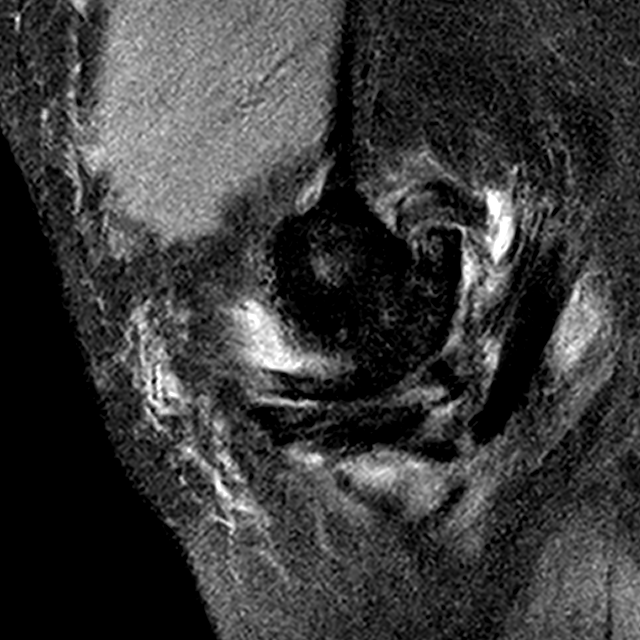

[Series 6: t2fs cor · coronal · 3.0mm · 0.19mm/px · 3 of 28 slices shown]
[im 5/28]
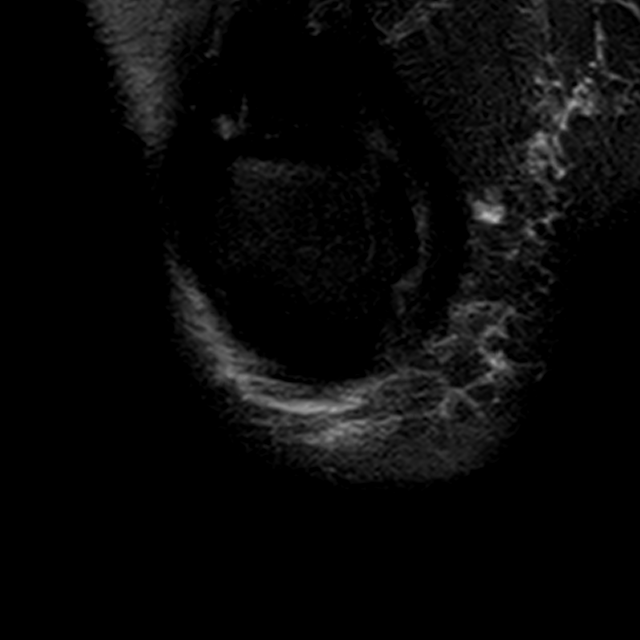
[im 14/28]
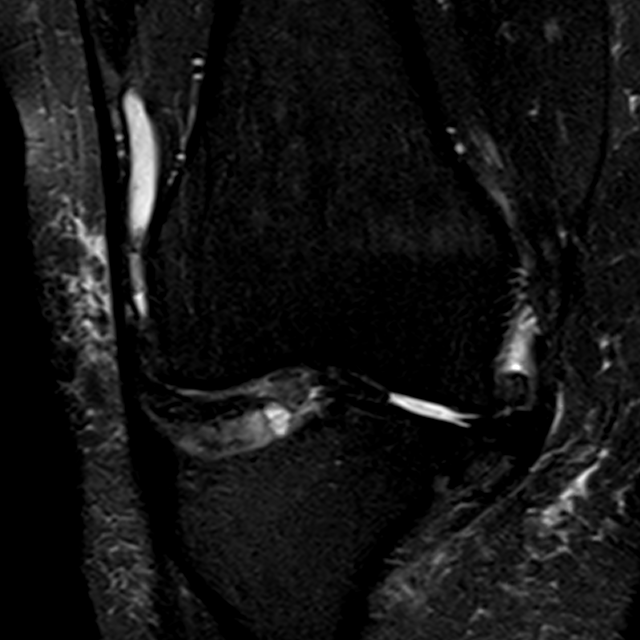
[im 23/28]
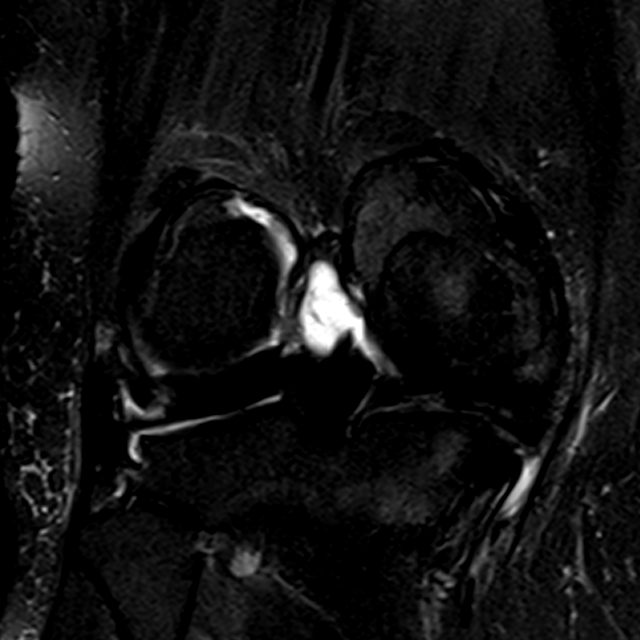

[13 of 40 positions shown; findings below may reference images not displayed]

FINDINGS: MENISCI

Medial meniscus: There is complex tearing throughout the posterior
horn of the medial meniscus. The body of medial meniscus is extruded
peripherally out of the joint.

Lateral meniscus: Degenerative signal is seen in the posterior horn
but no tear is identified.

LIGAMENTS

Cruciates:  Intact.

Collaterals: There is thickening and mild intrasubstance increased
T2 signal in the superior fibers of the medial collateral ligament
compatible with sprain without tear. Lateral collateral ligament
complex is unremarkable.

CARTILAGE

Patellofemoral: Cartilage thinning is most notable along the medial
facet in the upper pole.

Medial: Marked cartilage loss throughout with associated joint space
narrowing.

Lateral:  Mildly degenerated.

Joint: Small joint effusion. Loose body just superior to the medial
femoral condyle measuring 1 cm in diameter is seen.

Popliteal Fossa: Baker's cyst measuring 0.9 cm transverse by 1.3 cm
AP by 4.7 cm craniocaudal is identified.

Extensor Mechanism:  Intact.

Bones: No fracture or worrisome marrow lesion. Very bulky
tricompartmental osteophytosis is present.

Other: None.
IMPRESSION: Dominant finding is advanced tricompartmental osteoarthritis
appearing worst medially.

Complex tearing posterior horn medial meniscus.

Grade 2 sprain of the superior fibers of the MCL without tear is
likely related to degenerative disease.

Baker's cyst.

## 2017-07-01 ENCOUNTER — Encounter: Payer: Self-pay | Admitting: Physician Assistant

## 2017-07-01 ENCOUNTER — Ambulatory Visit: Payer: Self-pay | Admitting: Physician Assistant

## 2017-07-01 VITALS — BP 110/70 | HR 92 | Temp 98.0°F | Wt 224.0 lb

## 2017-07-01 DIAGNOSIS — M25561 Pain in right knee: Secondary | ICD-10-CM

## 2017-07-01 DIAGNOSIS — Z1239 Encounter for other screening for malignant neoplasm of breast: Secondary | ICD-10-CM

## 2017-07-01 DIAGNOSIS — J449 Chronic obstructive pulmonary disease, unspecified: Secondary | ICD-10-CM

## 2017-07-01 DIAGNOSIS — F17210 Nicotine dependence, cigarettes, uncomplicated: Secondary | ICD-10-CM

## 2017-07-01 NOTE — Patient Instructions (Signed)
[facility] financial counseling [phone]

## 2017-07-01 NOTE — Progress Notes (Signed)
BP 110/70 (BP Location: Left Arm, Patient Position: Sitting, Cuff Size: Normal)   Pulse 92   Temp 98 F (36.7 C)   Wt 224 lb (101.6 kg)   LMP 04/14/2010   SpO2 96%   BMI 40.32 kg/m    Subjective:    Patient ID: Michele Romero, female    DOB: [birth year], 57 y.o.   MRN: [id number]  HPI: Michele Romero is a 57 y.o. female presenting on 07/01/2017 for Follow-up   HPI   Pt says she heard from [facility] orth[facility] and was told her first visit would be $200 and some dollars  (pt got denied for [facility])  Breathing better with inhaler.  She is still smoking  Pt has no other complaints today  Relevant past medical, surgical, family and social history reviewed and updated as indicated. Interim medical history since our last visit reviewed. Allergies and medications reviewed and updated.   Current Outpatient Medications:  .  albuterol (PROVENTIL HFA;VENTOLIN HFA) 108 (90 Base) MCG/ACT inhaler, Inhale 2 puffs into the lungs every 6 (six) hours as needed for wheezing or shortness of breath., Disp: 3 Inhaler, Rfl: 1 .  IBUPROFEN PO, Take 400 mg by mouth at bedtime as needed. , Disp: , Rfl:    Review of Systems  Constitutional: Negative for appetite change, chills, diaphoresis, fatigue, fever and unexpected weight change.  HENT: Negative for congestion, dental problem, drooling, ear pain, facial swelling, hearing loss, mouth sores, sneezing, sore throat, trouble swallowing and voice change.   Eyes: Negative for pain, discharge, redness, itching and visual disturbance.  Respiratory: Positive for cough, shortness of breath and wheezing. Negative for choking.   Cardiovascular: Positive for leg swelling. Negative for chest pain and palpitations.  Gastrointestinal: Negative for abdominal pain, blood in stool, constipation, diarrhea and vomiting.  Endocrine: Negative for cold intolerance, heat intolerance and polydipsia.  Genitourinary: Negative for decreased urine volume, dysuria  and hematuria.  Musculoskeletal: Positive for arthralgias, back pain and gait problem.  Skin: Negative for rash.  Allergic/Immunologic: Negative for environmental allergies.  Neurological: Positive for headaches. Negative for seizures, syncope and light-headedness.  Hematological: Negative for adenopathy.  Psychiatric/Behavioral: Negative for agitation, dysphoric mood and suicidal ideas. The patient is not nervous/anxious.     Per HPI unless specifically indicated above     Objective:    BP 110/70 (BP Location: Left Arm, Patient Position: Sitting, Cuff Size: Normal)   Pulse 92   Temp 98 F (36.7 C)   Wt 224 lb (101.6 kg)   LMP 04/14/2010   SpO2 96%   BMI 40.32 kg/m   Wt Readings from Last 3 Encounters:  07/01/17 224 lb (101.6 kg)  03/31/17 226 lb (102.5 kg)  03/30/17 226 lb (102.5 kg)    Physical Exam  Constitutional: She is oriented to person, place, and time. She appears well-developed and well-nourished.  HENT:  Head: Normocephalic and atraumatic.  Neck: Neck supple.  Cardiovascular: Normal rate and regular rhythm.  Pulmonary/Chest: Effort normal and breath sounds normal.  Abdominal: Soft. Bowel sounds are normal. She exhibits no mass. There is no hepatosplenomegaly. There is no tenderness.  Musculoskeletal: She exhibits no edema.  Lymphadenopathy:    She has no cervical adenopathy.  Neurological: She is alert and oriented to person, place, and time.  Skin: Skin is warm and dry.  Psychiatric: She has a normal mood and affect. Her behavior is normal.  Vitals reviewed.       Assessment & Plan:   Encounter  Diagnoses  Name Primary?  . Right knee pain, unspecified chronicity Yes  . Chronic obstructive pulmonary disease, unspecified COPD type ([place])   . Cigarette nicotine dependence without complication   . Screening for breast cancer   . Morbid obesity ([name])     -ordered Screening mammogram -pt was given phone number for financial counseling at [facility] to  discuss assistance so she can get to the orthopedist for her knee pain -pt counseled on smoking cessation -pt to follow up in 6 months.  RTO sooner prn

## 2017-07-09 ENCOUNTER — Other Ambulatory Visit: Payer: Self-pay | Admitting: Physician Assistant

## 2017-07-09 DIAGNOSIS — Z1231 Encounter for screening mammogram for malignant neoplasm of breast: Secondary | ICD-10-CM

## 2017-10-12 ENCOUNTER — Other Ambulatory Visit: Payer: Self-pay

## 2017-10-12 ENCOUNTER — Emergency Department (HOSPITAL_COMMUNITY): Payer: Self-pay

## 2017-10-12 ENCOUNTER — Encounter (HOSPITAL_COMMUNITY): Payer: Self-pay | Admitting: *Deleted

## 2017-10-12 ENCOUNTER — Emergency Department (HOSPITAL_COMMUNITY)
Admission: EM | Admit: 2017-10-12 | Discharge: 2017-10-12 | Disposition: A | Discharge status: Home / Self Care | Payer: Self-pay | Attending: Emergency Medicine | Admitting: Emergency Medicine

## 2017-10-12 DIAGNOSIS — R1031 Right lower quadrant pain: Secondary | ICD-10-CM | POA: Insufficient documentation

## 2017-10-12 DIAGNOSIS — Z96652 Presence of left artificial knee joint: Secondary | ICD-10-CM | POA: Insufficient documentation

## 2017-10-12 DIAGNOSIS — F17210 Nicotine dependence, cigarettes, uncomplicated: Secondary | ICD-10-CM | POA: Insufficient documentation

## 2017-10-12 DIAGNOSIS — M79604 Pain in right leg: Secondary | ICD-10-CM | POA: Insufficient documentation

## 2017-10-12 MED ORDER — IBUPROFEN 800 MG PO TABS: 800.0000 mg | ORAL_TABLET | Freq: Three times a day (TID) | ORAL | 0 refills | Status: DC | PRN

## 2017-10-12 MED ORDER — IBUPROFEN 800 MG PO TABS
800.0000 mg | ORAL_TABLET | Freq: Once | ORAL | Status: AC
  Administered 2017-10-12: 800 mg via ORAL
  Filled 2017-10-12: qty 1

## 2017-10-12 NOTE — ED Provider Notes (Signed)
[facility] EMERGENCY DEPARTMENT Provider Note   CSN: [id number] Arrival date & time: 10/12/17  4098     History   Chief Complaint Chief Complaint  Patient presents with  . Groin Pain    HPI Michele Romero is a 57 y.o. female.  She presents to the emergency department today complaining of right groin pain that is been going on for about a week.  Since about moderate intensity sharp pain is worse with any kind of standing bending.  She is been trying ibuprofen for it without any relief.  Says she has some chronic right leg pain but usually involves her knee and its arthritis.  She does not recall any trauma recently to explain the groin pain is never had it before.  She does not endorse any chest pain shortness of breath fevers cough.  There is no numbness or tingling in the leg.  The history is provided by the patient.  Groin Pain  This is a new problem. The current episode started more than 1 week ago. The problem occurs constantly. The problem has not changed since onset.Pertinent negatives include no chest pain, no abdominal pain, no headaches and no shortness of breath. The symptoms are aggravated by walking, twisting and bending. Nothing relieves the symptoms. The treatment provided no relief.    Past Medical History:  Diagnosis Date  . Bronchitis   . Migraines     Patient Active Problem List   Diagnosis Date Noted  . Prediabetes 01/20/2017  . Osteoarthritis of right knee 01/20/2017  . Complex tear of medial meniscus of right knee 01/20/2017  . Right knee pain 06/25/2015  . Ganglion cyst of wrist 06/25/2015  . Cigarette nicotine dependence without complication 11/91/4782  . Morbid obesity ([place]) 06/25/2015    Past Surgical History:  Procedure Laterality Date  . APPENDECTOMY    . CHOLECYSTECTOMY    . KNEE ARTHROPLASTY     left knee  . TUBAL LIGATION  1987     OB History    Gravida  3   Para  2   Term  2   Preterm      AB  1   Living  2     SAB  1     TAB      Ectopic      Multiple      Live Births               Home Medications    Prior to Admission medications   Medication Sig Start Date End Date Taking? Authorizing Provider  albuterol (PROVENTIL HFA;VENTOLIN HFA) 108 (90 Base) MCG/ACT inhaler Inhale 2 puffs into the lungs every 6 (six) hours as needed for wheezing or shortness of breath. 01/20/17   Soyla Dryer, PA-C  IBUPROFEN PO Take 400 mg by mouth at bedtime as needed.     [provider]    Family History Family History  Problem Relation Age of Onset  . Diabetes Mother   . Hypertension Mother   . Heart disease Mother        CHF  . Stroke Mother   . Heart disease Father     Social History Social History   Tobacco Use  . Smoking status: Current Every Day Smoker    Packs/day: 0.25    Years: 30.00    Pack years: 7.50    Types: Cigarettes    Last attempt to quit: 09/20/2016    Years since quitting: 1.0  . Smokeless tobacco:  Never Used  Substance Use Topics  . Alcohol use: No  . Drug use: Yes    Frequency: 6.0 times per week    Types: Marijuana     Allergies   Codeine   Review of Systems Review of Systems  Constitutional: Negative for fever.  HENT: Negative for sore throat.   Eyes: Negative for visual disturbance.  Respiratory: Negative for shortness of breath.   Cardiovascular: Negative for chest pain.  Gastrointestinal: Negative for abdominal pain.  Genitourinary: Negative for dysuria.  Musculoskeletal: Negative for neck pain.  Skin: Negative for rash.  Neurological: Negative for headaches.     Physical Exam Updated Vital Signs BP (!) 130/92 (BP Location: Left Arm)   Pulse 83   Temp 97.9 F (36.6 C) (Oral)   Resp 18   Ht 5\' 2"  (1.575 m)   Wt 106.1 kg (234 lb)   LMP 04/14/2010   SpO2 96%   BMI 42.80 kg/m   Physical Exam  Constitutional: She appears well-developed and well-nourished.  HENT:  Head: Normocephalic and atraumatic.  Eyes: Conjunctivae are normal.   Neck: Neck supple.  Cardiovascular: Normal rate, regular rhythm, normal heart sounds and intact distal pulses.  Pulmonary/Chest: Effort normal. No respiratory distress. She has no wheezes. She has no rales.  Abdominal: Soft. She exhibits no mass. There is no tenderness. There is no guarding.  Musculoskeletal:  Right lower extremity she is got normal femoral and DP pulses.  There is reproducible tenderness in the right groin but no obvious masses felt.  There is no overlying erythema.  She has a small scab from what looks like may be a bite wound with minimal surrounding erythema this appears to be nontender and is likely unrelated to the patient's complaint.  Neurological: She is alert. GCS eye subscore is 4. GCS verbal subscore is 5. GCS motor subscore is 6.  Skin: Skin is warm and dry.  Psychiatric: She has a normal mood and affect.     ED Treatments / Results  Labs (all labs ordered are listed, but only abnormal results are displayed) Labs Reviewed - No data to display  EKG None  Radiology US Venous Img Lower Right (dvt Study)  Result Date: 10/12/2017 CLINICAL DATA:  Right groin and leg pain and edema for the past several days. History of smoking. Evaluate for DVT. EXAM: RIGHT LOWER EXTREMITY VENOUS DOPPLER ULTRASOUND TECHNIQUE: Gray-scale sonography with graded compression, as well as color Doppler and duplex ultrasound were performed to evaluate the lower extremity deep venous systems from the level of the common femoral vein and including the common femoral, femoral, profunda femoral, popliteal and calf veins including the posterior tibial, peroneal and gastrocnemius veins when visible. The superficial great saphenous vein was also interrogated. Spectral Doppler was utilized to evaluate flow at rest and with distal augmentation maneuvers in the common femoral, femoral and popliteal veins. COMPARISON:  None. FINDINGS: Examination is degraded due to patient body habitus and poor  sonographic window. Contralateral Common Femoral Vein: Respiratory phasicity is normal and symmetric with the symptomatic side. No evidence of thrombus. Normal compressibility. Common Femoral Vein: No evidence of thrombus. Normal compressibility, respiratory phasicity and response to augmentation. Saphenofemoral Junction: No evidence of thrombus. Normal compressibility and flow on color Doppler imaging. Profunda Femoral Vein: No evidence of thrombus. Normal compressibility and flow on color Doppler imaging. Femoral Vein: No evidence of thrombus. Normal compressibility, respiratory phasicity and response to augmentation. Popliteal Vein: No evidence of thrombus. Normal compressibility, respiratory phasicity and response to  augmentation. Calf Veins: No evidence of thrombus. Normal compressibility and flow on color Doppler imaging. Superficial Great Saphenous Vein: No evidence of thrombus. Normal compressibility. Venous Reflux:  None. Other Findings: Note is made of an approximately 4.0 x 4.0 x 1.5 cm fluid collection within the right popliteal fossa favored to represent a Baker cyst. IMPRESSION: 1. No evidence of DVT within the right lower extremity. 2. Incidentally noted approximately 4.0 cm Baker's cyst. Electronically Signed   By: Sandi Mariscal M.D.   On: 10/12/2017 08:28    Procedures Procedures (including critical care time)  Medications Ordered in ED Medications - No data to display   Initial Impression / Assessment and Plan / ED Course  I have reviewed the triage vital signs and the nursing notes.  Pertinent labs & imaging results that were available during my care of the patient were reviewed by me and considered in my medical decision making (see chart for details).  Clinical Course as of Oct 12 757  Mon Oct 12, 6972  675 57 year old female here with atraumatic right groin pain.  Its likely muscular as it is tender on exam and do not have any overlying skin findings or no obvious mass.  I  doubt hernia or at least not obstruction because she is got no GI symptoms.  There is a small scab over an old wound but that does not appear to be tender.  I have ordered a venous ultrasound to make sure she does not have a DVT in that area.  Likely she will be discharged with symptomatic treatment.   [MB]    Clinical Course User Index [MB] Hayden Rasmussen, MD     Final Clinical Impressions(s) / ED Diagnoses   Final diagnoses:  Right groin pain    ED Discharge Orders        Ordered    ibuprofen (ADVIL,MOTRIN) 800 MG tablet  Every 8 hours PRN     10/12/17 0851       Hayden Rasmussen, MD 10/13/17 1020

## 2017-10-12 NOTE — Discharge Instructions (Addendum)
Your evaluated in the emergency department for right groin pain.  We did an ultrasound that did not show an obvious sign of blood clot.  This pain is likely muscular and should improve with ibuprofen, ice or heat as needed.  If it is getting worse or if he see a bulge or its associated with other symptoms like vomiting you should return to the emergency department.

## 2017-10-12 NOTE — ED Triage Notes (Signed)
Pt c/o right groin pain that started a while ago but had gotten worse, denies any injury,

## 2017-11-03 ENCOUNTER — Other Ambulatory Visit (HOSPITAL_COMMUNITY)
Admission: RE | Admit: 2017-11-03 | Discharge: 2017-11-03 | Disposition: A | Discharge status: Home / Self Care | Payer: Self-pay | Source: Ambulatory Visit | Attending: Physician Assistant | Admitting: Physician Assistant

## 2017-11-03 ENCOUNTER — Encounter: Payer: Self-pay | Admitting: Physician Assistant

## 2017-11-03 ENCOUNTER — Ambulatory Visit: Payer: Self-pay | Admitting: Physician Assistant

## 2017-11-03 VITALS — BP 124/80 | HR 88 | Temp 97.5°F | Ht 61.25 in | Wt 227.5 lb

## 2017-11-03 DIAGNOSIS — E876 Hypokalemia: Secondary | ICD-10-CM

## 2017-11-03 DIAGNOSIS — M1711 Unilateral primary osteoarthritis, right knee: Secondary | ICD-10-CM

## 2017-11-03 DIAGNOSIS — M25561 Pain in right knee: Secondary | ICD-10-CM

## 2017-11-03 DIAGNOSIS — N3000 Acute cystitis without hematuria: Secondary | ICD-10-CM

## 2017-11-03 LAB — BASIC METABOLIC PANEL
Anion gap: 5 (ref 5–15)
BUN: 13 mg/dL (ref 6–20)
CO2: 29 mmol/L (ref 22–32)
CREATININE: 0.72 mg/dL (ref 0.44–1.00)
Calcium: 8.9 mg/dL (ref 8.9–10.3)
Chloride: 106 mmol/L (ref 98–111)
GFR calc Af Amer: >60 mL/min (ref >60)
Glucose, Bld: 87 mg/dL (ref 70–99)
Potassium: 4 mmol/L (ref 3.5–5.1)
SODIUM: 140 mmol/L (ref 135–145)

## 2017-11-03 NOTE — Progress Notes (Signed)
BP 124/80 (BP Location: Left Arm, Patient Position: Sitting, Cuff Size: Large)   Pulse 88   Temp (!) 97.5 F (36.4 C)   Ht 5' 1.25" (1.556 m)   Wt 227 lb 8 oz (103.2 kg)   LMP 04/14/2010   SpO2 97%   BMI 42.64 kg/m    Subjective:    Patient ID: Michele Romero, female    DOB: 02/27/1961, 57 y.o.   MRN: [id number]  HPI: Michele Romero is a 57 y.o. female presenting on 11/03/2017 for Follow-up (pt went to [facility]- ER on 10-15-17 due to "passing out". pt states she went to use the restroom and got real hot. pt states she stayed the night at [facility] )   HPI  Chief Complaint  Patient presents with  . Follow-up    pt went to [facility]- ER on 10-15-17 due to "passing out". pt states she went to use the restroom and got real hot. pt states she stayed the night at [facility] from [facility] are unavailable but she has her discharge paperwork shows- near syncope, hypokalemia, vomiting, UTI, hypomagnesemia  Pt didn't get her rx macrobid because it's expensive.  She is no longer throwing up.   Pt says she is feeling okay except for her dysuria.   Pt was referred to [facility] in October 2018 for her R knee.  She says "some man" called and told her how much money she would need in order to be seen.    She says she couldn't get [facility] told her she had too much money in the bank.   Relevant past medical, surgical, family and social history reviewed and updated as indicated. Interim medical history since our last visit reviewed. Allergies and medications reviewed and updated.  Review of Systems  Constitutional: Negative for appetite change, chills, diaphoresis, fatigue, fever and unexpected weight change.  HENT: Negative for congestion, dental problem, drooling, ear pain, facial swelling, hearing loss, mouth sores, sneezing, sore throat, trouble swallowing and voice change.   Eyes: Negative for pain, discharge, redness, itching and visual disturbance.  Respiratory: Negative for cough,  choking, shortness of breath and wheezing.   Cardiovascular: Negative for chest pain, palpitations and leg swelling.  Gastrointestinal: Negative for abdominal pain, blood in stool, constipation, diarrhea and vomiting.  Endocrine: Negative for cold intolerance, heat intolerance and polydipsia.  Genitourinary: Positive for dysuria. Negative for decreased urine volume and hematuria.  Musculoskeletal: Negative for arthralgias, back pain and gait problem.  Skin: Negative for rash.  Allergic/Immunologic: Negative for environmental allergies.  Neurological: Negative for seizures, syncope, light-headedness and headaches.  Hematological: Negative for adenopathy.  Psychiatric/Behavioral: Negative for agitation, dysphoric mood and suicidal ideas. The patient is not nervous/anxious.     Per HPI unless specifically indicated above     Objective:    BP 124/80 (BP Location: Left Arm, Patient Position: Sitting, Cuff Size: Large)   Pulse 88   Temp (!) 97.5 F (36.4 C)   Ht 5' 1.25" (1.556 m)   Wt 227 lb 8 oz (103.2 kg)   LMP 04/14/2010   SpO2 97%   BMI 42.64 kg/m   Wt Readings from Last 3 Encounters:  11/03/17 227 lb 8 oz (103.2 kg)  10/12/17 234 lb (106.1 kg)  07/01/17 224 lb (101.6 kg)    Physical Exam  Constitutional: She is oriented to person, place, and time. She appears well-developed and well-nourished.  HENT:  Head: Normocephalic and atraumatic.  Neck: Neck supple.  Cardiovascular: Normal rate and regular rhythm.  Pulmonary/Chest: Effort normal and breath sounds normal.  Abdominal: Soft. Bowel sounds are normal. She exhibits no mass. There is no hepatosplenomegaly. There is no tenderness.  Musculoskeletal: She exhibits no edema.  Gait antalgic  Lymphadenopathy:    She has no cervical adenopathy.  Neurological: She is alert and oriented to person, place, and time.  Skin: Skin is warm and dry.  Psychiatric: She has a normal mood and affect. Her behavior is normal.  Vitals  reviewed.       Assessment & Plan:    Encounter Diagnoses  Name Primary?  . Acute cystitis without hematuria Yes  . Hypokalemia   . Right knee pain, unspecified chronicity   . Osteoarthritis of right knee, unspecified osteoarthritis type      -recheck bmp today -pt given coupon for her macrobid.  She says she can afford the coupon price.  She is told that she won't need to get the other Rx filled since she is no longer having the emesis -nurse will contact [facility] to check on pt's financial assistance.  Pt is given contact number for the financial counseling office -pt to follow up in September as scheduled.  RTO sooner prn

## 2017-11-03 NOTE — Patient Instructions (Signed)
[place] [phone]

## 2017-12-28 ENCOUNTER — Encounter: Payer: Self-pay | Admitting: Physician Assistant

## 2017-12-30 ENCOUNTER — Ambulatory Visit: Payer: Self-pay | Admitting: Physician Assistant

## 2018-01-18 ENCOUNTER — Encounter: Payer: Self-pay | Admitting: Physician Assistant

## 2019-10-28 ENCOUNTER — Ambulatory Visit (INDEPENDENT_AMBULATORY_CARE_PROVIDER_SITE_OTHER): Payer: 59 | Admitting: Family Medicine

## 2019-10-28 ENCOUNTER — Encounter: Payer: Self-pay | Admitting: Family Medicine

## 2019-10-28 ENCOUNTER — Ambulatory Visit
Admission: RE | Admit: 2019-10-28 | Discharge: 2019-10-28 | Disposition: A | Discharge status: Home / Self Care | Payer: 59 | Source: Ambulatory Visit | Attending: Family Medicine | Admitting: Family Medicine

## 2019-10-28 ENCOUNTER — Other Ambulatory Visit: Payer: Self-pay

## 2019-10-28 DIAGNOSIS — M25561 Pain in right knee: Secondary | ICD-10-CM

## 2019-10-28 DIAGNOSIS — J449 Chronic obstructive pulmonary disease, unspecified: Secondary | ICD-10-CM | POA: Diagnosis not present

## 2019-10-28 DIAGNOSIS — R7303 Prediabetes: Secondary | ICD-10-CM

## 2019-10-28 DIAGNOSIS — Z1231 Encounter for screening mammogram for malignant neoplasm of breast: Secondary | ICD-10-CM

## 2019-10-28 DIAGNOSIS — M1711 Unilateral primary osteoarthritis, right knee: Secondary | ICD-10-CM

## 2019-10-28 DIAGNOSIS — M25551 Pain in right hip: Secondary | ICD-10-CM | POA: Diagnosis not present

## 2019-10-28 DIAGNOSIS — Z1211 Encounter for screening for malignant neoplasm of colon: Secondary | ICD-10-CM

## 2019-10-28 DIAGNOSIS — G8929 Other chronic pain: Secondary | ICD-10-CM

## 2019-10-28 DIAGNOSIS — I10 Essential (primary) hypertension: Secondary | ICD-10-CM

## 2019-10-28 MED ORDER — ALBUTEROL SULFATE HFA 108 (90 BASE) MCG/ACT IN AERS: 2.0000 | INHALATION_SPRAY | Freq: Four times a day (QID) | RESPIRATORY_TRACT | 1 refills | Status: DC | PRN

## 2019-10-28 MED ORDER — BUDESONIDE-FORMOTEROL FUMARATE 80-4.5 MCG/ACT IN AERO: 2.0000 | INHALATION_SPRAY | Freq: Two times a day (BID) | RESPIRATORY_TRACT | 3 refills | Status: DC

## 2019-10-28 MED ORDER — KETOROLAC TROMETHAMINE 60 MG/2ML IM SOLN
60.0000 mg | Freq: Once | INTRAMUSCULAR | Status: AC
  Administered 2019-10-28: 10:00:00 60 mg via INTRAMUSCULAR

## 2019-10-28 MED ORDER — METHYLPREDNISOLONE ACETATE 80 MG/ML IJ SUSP
80.0000 mg | Freq: Once | INTRAMUSCULAR | Status: AC
  Administered 2019-10-28: 10:00:00 80 mg via INTRAMUSCULAR

## 2019-10-28 NOTE — Patient Instructions (Addendum)
I appreciate the opportunity to provide you with care for your health and wellness. Today we discussed: established care   Follow up: 3 months for BP check  Labs-fasting within the next week Referrals today- mammogram, ortho, and colonoscopy  GREAT TO MEET YOU TODAY! :)  Please continue to practice social distancing to keep you, your family, and our community safe.  If you must go out, please wear a mask and practice good handwashing.  It was a pleasure to see you and I look forward to continuing to work together on your health and well-being. Please do not hesitate to call the office if you need care or have questions about your care.  Have a wonderful day and week. With Gratitude, Cherly Beach, DNP, AGNP-BC

## 2019-10-28 NOTE — Progress Notes (Signed)
Subjective:  Patient ID: Michele Romero, female    DOB: September 30, 1960  Age: 59 y.o. MRN: [id number]  CC:  Chief Complaint  Patient presents with  . New Patient (Initial Visit)    new pt [facility] clinic pt has pain in right leg and hip and also pain in her lower back for awhile also COPD is acting up she has been getting sob for about a week       HPI  HPI  [name] is a 59 year old female patient who presents today to establish care.  Previously a former patient of the free clinic.  Had also been seen by Dr. Holly Bodily once.  She reports that she has pain in her right leg and hip these have been ongoing as well as her low back.  Would like a referral back to [facility] for this.  Pain is present when she utilizes this to help with her walking.  She is open to a colonoscopy referral.  Updated labs mammogram ordered.  Looks a half a pack a day.  COPD use of inhalers as she needs.  She reports she has been trying to cut back more.  She denies having any sleeping trouble.  Has some missing teeth.  But no trouble eating appetite change.  Denies having any blood in urine or stool.  Does have some constipation but overall that is kind of been her baseline.  She thinks is from taking ibuprofen to help with her joint discomfort.  Memory is good.  She had a fall 2 weeks back due to her right knee giving out on her.  But she denies any injury.  She denies any skin issues.  Glasses were recommended to her over a year ago when she saw the doctor.  She does have some vision changes but she has not picked up her glasses.  She denies having chest pain, headaches, dizziness.  Denies excessive cough or shortness of breath past her baseline COPD level.  Does have some leg swelling and ankle swelling at times.  Today patient denies signs and symptoms of COVID 19 infection including fever, chills, cough, shortness of breath, and headache. Past Medical, Surgical, Social History, Allergies, and Medications have  been Reviewed.   Past Medical History:  Diagnosis Date  . Bronchitis   . Cigarette nicotine dependence without complication 10/20/6281  . COPD (chronic obstructive pulmonary disease) ([place])   . Ganglion cyst of wrist 06/25/2015  . Migraines     Current Meds  Medication Sig  . ibuprofen (ADVIL,MOTRIN) 800 MG tablet Take 1 tablet (800 mg total) by mouth every 8 (eight) hours as needed.    ROS:  Review of Systems  Constitutional: Negative.   HENT: Negative.   Eyes: Negative.   Respiratory: Positive for cough and shortness of breath.   Cardiovascular: Positive for leg swelling.  Gastrointestinal: Negative.   Genitourinary: Negative.   Musculoskeletal: Positive for back pain and joint pain.  Skin: Negative.   Neurological: Negative.   Endo/Heme/Allergies: Negative.   Psychiatric/Behavioral: Negative.   All other systems reviewed and are negative.    Objective:   Today's Vitals: BP 138/76 (BP Location: Right Arm, Patient Position: Sitting, Cuff Size: Normal)   Pulse 73   Temp (!) 97.3 F (36.3 C) (Temporal)   Resp 16   Ht 5' 2.5" (1.588 m)   LMP 04/14/2010   SpO2 98%   BMI 40.95 kg/m  Vitals with BMI 10/28/2019 11/03/2017 10/12/2017  Height 5' 2.5" 5'  1.25" -  Weight - 227 lbs 8 oz -  BMI - 24.40 -  Systolic 102 725 366  Diastolic 76 80 77  Pulse 73 88 75     Physical Exam Vitals and nursing note reviewed.  Constitutional:      Appearance: Normal appearance. She is well-developed and well-groomed. She is obese.  HENT:     Head: Normocephalic and atraumatic.     Right Ear: External ear normal.     Left Ear: External ear normal.     Mouth/Throat:     Comments: Mask in place Eyes:     General:        Right eye: No discharge.        Left eye: No discharge.     Conjunctiva/sclera: Conjunctivae normal.  Cardiovascular:     Rate and Rhythm: Normal rate and regular rhythm.     Pulses: Normal pulses.     Heart sounds: Normal heart sounds.  Pulmonary:     Effort:  Pulmonary effort is normal.     Breath sounds: Normal breath sounds.  Musculoskeletal:        General: Normal range of motion.     Cervical back: Normal range of motion and neck supple.     Comments: [name] present  Skin:    General: Skin is warm.  Neurological:     General: No focal deficit present.     Mental Status: She is alert and oriented to person, place, and time.  Psychiatric:        Attention and Perception: Attention normal.        Mood and Affect: Mood normal.        Speech: Speech normal.        Behavior: Behavior normal. Behavior is cooperative.        Thought Content: Thought content normal.        Cognition and Memory: Cognition normal.        Judgment: Judgment normal.     Assessment   1. Morbid obesity ([place])   2. Chronic obstructive pulmonary disease, unspecified COPD type ([place])   3. Chronic pain of right knee   4. Primary osteoarthritis of right knee   5. Right hip pain   6. Encounter for screening for malignant neoplasm of colon   7. Screening mammogram, encounter for   8. Essential hypertension   9. Prediabetes     Tests ordered Orders Placed This Encounter  Procedures  . MM Digital Screening  . CBC  . Lipid panel  . Hemoglobin A1c  . CMP14+EGFR  . Ambulatory referral to Gastroenterology  . Ambulatory referral to Orthopedic Surgery     Plan: Please see assessment and plan per problem list above.   Meds ordered this encounter  Medications  . albuterol (VENTOLIN HFA) 108 (90 Base) MCG/ACT inhaler    Sig: Inhale 2 puffs into the lungs every 6 (six) hours as needed for wheezing or shortness of breath.    Dispense:  8 g    Refill:  1    Order Specific Question:   Supervising Provider    Answer:   SIMPSON, MARGARET E [[id number]]  . budesonide-formoterol (SYMBICORT) 80-4.5 MCG/ACT inhaler    Sig: Inhale 2 puffs into the lungs 2 (two) times daily.    Dispense:  1 Inhaler    Refill:  3    Order Specific Question:   Supervising Provider    Answer:    SIMPSON, MARGARET E [[id number]]  . ketorolac (TORADOL)  injection 60 mg  . methylPREDNISolone acetate (DEPO-MEDROL) injection 80 mg    Patient to follow-up in 3 months   Perlie Mayo, NP

## 2019-10-30 ENCOUNTER — Encounter: Payer: Self-pay | Admitting: Family Medicine

## 2019-10-30 DIAGNOSIS — I10 Essential (primary) hypertension: Secondary | ICD-10-CM | POA: Insufficient documentation

## 2019-10-30 DIAGNOSIS — Z0001 Encounter for general adult medical examination with abnormal findings: Secondary | ICD-10-CM | POA: Insufficient documentation

## 2019-10-30 DIAGNOSIS — Z1211 Encounter for screening for malignant neoplasm of colon: Secondary | ICD-10-CM | POA: Insufficient documentation

## 2019-10-30 DIAGNOSIS — M25551 Pain in right hip: Secondary | ICD-10-CM | POA: Insufficient documentation

## 2019-10-30 NOTE — Assessment & Plan Note (Signed)
Colonoscopy referral made

## 2019-10-30 NOTE — Assessment & Plan Note (Signed)
Michele Romero is encouraged to maintain a well balanced diet that is low in salt. Controlled, continue current medication regimen.  However she is on the higher end of normal.  Will review this after we get some labs and see if any medication changes are needed.  DASH diet and exercise are encouraged.  Weight loss is suggested to help with maintaining overall health.

## 2019-10-30 NOTE — Assessment & Plan Note (Signed)
Obesity is linked to hypertension, hyperlipidemia  Michele Romero is educated about the importance of exercise daily to help with weight management. A minumum of 30 minutes daily is recommended. Additionally, importance of healthy food choices  with portion control discussed.   Wt Readings from Last 3 Encounters:  11/03/17 227 lb 8 oz (103.2 kg)  10/12/17 234 lb (106.1 kg)  07/01/17 224 lb (101.6 kg)

## 2019-10-30 NOTE — Assessment & Plan Note (Signed)
Mammogram ordered appointment made.

## 2019-10-30 NOTE — Assessment & Plan Note (Signed)
Referral to [facility].

## 2019-10-30 NOTE — Assessment & Plan Note (Signed)
Was not on anything outside of rescue inhaler.  Is open to trying Symbicort.  She was provided with education on side effects and usage of Symbicort.  And advised to follow-up sooner than her next appointment if needed.

## 2019-10-30 NOTE — Assessment & Plan Note (Addendum)
Referral back to [facility]. Toradol and Depo-Medrol injections provided today to help with pain.

## 2019-10-30 NOTE — Assessment & Plan Note (Signed)
Updated labs ordered.  Heart healthy low-fat diet that is low in sugar to help with weight management is suggested.

## 2019-10-31 ENCOUNTER — Encounter (INDEPENDENT_AMBULATORY_CARE_PROVIDER_SITE_OTHER): Payer: Self-pay | Admitting: *Deleted

## 2019-11-02 ENCOUNTER — Other Ambulatory Visit: Payer: Self-pay | Admitting: Family Medicine

## 2019-11-03 LAB — COMPLETE METABOLIC PANEL WITH GFR
AG Ratio: 1.3 (calc) (ref 1.0–2.5)
ALT: 12 U/L (ref 6–29)
AST: 13 U/L (ref 10–35)
Albumin: 4.1 g/dL (ref 3.6–5.1)
Alkaline phosphatase (APISO): 69 U/L (ref 37–153)
BUN: 15 mg/dL (ref 7–25)
CO2: 29 mmol/L (ref 20–32)
Calcium: 8.9 mg/dL (ref 8.6–10.4)
Chloride: 105 mmol/L (ref 98–110)
Creat: 0.72 mg/dL (ref 0.50–0.99)
GFR, Est African American: 105 mL/min/{1.73_m2} (ref >=60)
GFR, Est Non African American: 91 mL/min/{1.73_m2} (ref >=60)
Globulin: 3.2 g/dL (calc) (ref 1.9–3.7)
Glucose, Bld: 92 mg/dL (ref 65–99)
Potassium: 4.3 mmol/L (ref 3.5–5.3)
Sodium: 143 mmol/L (ref 135–146)
Total Bilirubin: 0.3 mg/dL (ref 0.2–1.2)
Total Protein: 7.3 g/dL (ref 6.1–8.1)

## 2019-11-03 LAB — CBC
HCT: 39.4 % (ref 35.0–45.0)
Hemoglobin: 13 g/dL (ref 11.7–15.5)
MCH: 30.4 pg (ref 27.0–33.0)
MCHC: 33 g/dL (ref 32.0–36.0)
MCV: 92.1 fL (ref 80.0–100.0)
MPV: 13.6 fL — ABNORMAL HIGH (ref 7.5–12.5)
Platelets: 232 10*3/uL (ref 140–400)
RBC: 4.28 10*6/uL (ref 3.80–5.10)
RDW: 13.2 % (ref 11.0–15.0)
WBC: 7.3 10*3/uL (ref 3.8–10.8)

## 2019-11-03 LAB — LIPID PANEL
Cholesterol: 150 mg/dL (ref <200)
HDL: 50 mg/dL (ref >=50)
LDL Cholesterol (Calc): 84 mg/dL (calc)
Non-HDL Cholesterol (Calc): 100 mg/dL (calc) (ref <130)
Total CHOL/HDL Ratio: 3 (calc) (ref <5.0)
Triglycerides: 70 mg/dL (ref <150)

## 2019-11-03 LAB — HEMOGLOBIN A1C W/OUT EAG: Hgb A1c MFr Bld: 5.6 % of total Hgb (ref <5.7)

## 2019-11-21 ENCOUNTER — Encounter (INDEPENDENT_AMBULATORY_CARE_PROVIDER_SITE_OTHER): Payer: Self-pay | Admitting: *Deleted

## 2019-11-21 ENCOUNTER — Telehealth (INDEPENDENT_AMBULATORY_CARE_PROVIDER_SITE_OTHER): Payer: Self-pay | Admitting: *Deleted

## 2019-11-21 ENCOUNTER — Other Ambulatory Visit (INDEPENDENT_AMBULATORY_CARE_PROVIDER_SITE_OTHER): Payer: Self-pay | Admitting: *Deleted

## 2019-11-21 NOTE — Telephone Encounter (Signed)
Referring MD/PCP: mills   Procedure: tcs  Reason/Indication:  screening  Has patient had this procedure before?  no  If so, when, by whom and where?    Is there a family history of colon cancer?  no  Who?  What age when diagnosed?    Is patient diabetic?   no      Does patient have prosthetic heart valve or mechanical valve?  no  Do you have a pacemaker/defibrillator?  no  Has patient ever had endocarditis/atrial fibrillation? no  Does patient use oxygen? no  Has patient had joint replacement within last 12 months?  no  Is patient constipated or do they take laxatives? no  Does patient have a history of alcohol/drug use?  no  Is patient on blood thinner such as Coumadin, Plavix and/or Aspirin? yes  Medications: asa 81 mg daily, albuterol 2 puffs every 6 hrs prn, ibuprofen prn, symbicort 2 puffs bid  Allergies: codeine  Medication Adjustment per Dr Rehman/Dr Jenetta Downer   Procedure date & time: 11/29/19 at 930

## 2019-11-21 NOTE — Telephone Encounter (Signed)
Ok to schedule in room 1.  Thanks,  Harvel Quale, MD Gastroenterology and Hepatology [facility]

## 2019-11-22 ENCOUNTER — Other Ambulatory Visit (INDEPENDENT_AMBULATORY_CARE_PROVIDER_SITE_OTHER): Payer: Self-pay | Admitting: *Deleted

## 2019-11-22 DIAGNOSIS — Z1211 Encounter for screening for malignant neoplasm of colon: Secondary | ICD-10-CM

## 2019-11-23 ENCOUNTER — Other Ambulatory Visit: Payer: Self-pay

## 2019-11-23 ENCOUNTER — Encounter: Payer: Self-pay | Admitting: Orthopedic Surgery

## 2019-11-23 ENCOUNTER — Ambulatory Visit (INDEPENDENT_AMBULATORY_CARE_PROVIDER_SITE_OTHER): Payer: 59 | Admitting: Orthopedic Surgery

## 2019-11-23 ENCOUNTER — Ambulatory Visit: Payer: 59

## 2019-11-23 ENCOUNTER — Encounter (HOSPITAL_COMMUNITY)
Admission: RE | Admit: 2019-11-23 | Discharge: 2019-11-23 | Disposition: A | Discharge status: Home / Self Care | Payer: 59 | Source: Ambulatory Visit | Attending: Gastroenterology | Admitting: Gastroenterology

## 2019-11-23 VITALS — BP 140/87 | HR 98 | Ht 62.5 in | Wt 213.0 lb

## 2019-11-23 DIAGNOSIS — M545 Low back pain, unspecified: Secondary | ICD-10-CM

## 2019-11-23 DIAGNOSIS — M25551 Pain in right hip: Secondary | ICD-10-CM | POA: Diagnosis not present

## 2019-11-23 DIAGNOSIS — Z720 Tobacco use: Secondary | ICD-10-CM | POA: Diagnosis not present

## 2019-11-23 MED ORDER — TRAMADOL-ACETAMINOPHEN 37.5-325 MG PO TABS: 1.0000 | ORAL_TABLET | ORAL | 0 refills | Status: AC | PRN

## 2019-11-23 MED ORDER — NICOTINE 14 MG/24HR TD PT24: 14.0000 mg | MEDICATED_PATCH | Freq: Every day | TRANSDERMAL | 0 refills | Status: DC

## 2019-11-23 NOTE — Patient Instructions (Signed)
You will get a call from Dr Ninfa Linden at our [place] to [employer] from [employer] address is [place] The phone number is [phone]   1. Start out going [place] on [address] toward [address].  Then 0.02 miles0.02 total miles 2. Take the 1st right onto [address]/[place]. Continue to follow [address].  If you reach [facility] you've gone a little too far  Then 0.58 miles0.60 total miles 3. Turn right onto [place] is just past [facility]  Then 2.25 miles2.85 total miles 4. Take the [address] ramp toward [place].  Then 0.25 miles3.10 total miles 5. Merge onto [address]  Then 18.17 miles21.28 total miles 6. Merge onto [address]  Then 1.47 miles22.74 total miles 7. Turn right onto [place] is just past [address]  Then 0.11 miles22.85 total miles  8. [address], [place], [place] [address], [place] is on the left.  Smoking and Musculoskeletal Health Smoking is bad for your health. Most people know that smoking causes lung disease, heart disease, and cancer. But people may not realize that it also affects their bones, muscles, and joints (musculoskeletal system). When you smoke, the effects on your lungs and heart result in less oxygen for your musculoskeletal system. This can lead to poor bone and joint health. How can smoking affect my musculoskeletal health? Smoking can:  Increase your risk of having weak, thin bones (osteoporosis). Elderly smokers are at higher risk for bone fractures related to osteoporosis.  Decrease the ability of bone-forming cells to make and replace bone (in addition to reducing oxygen and blood flow).  Reduce your body's ability to absorb calcium from your diet. Less calcium means weaker bones.  Interfere with the breakdown of the female hormone estrogen. Smoking  lowers estrogen, which is a hormone that helps keep bones strong. Women who smoke may have earlier menopause. Menopause is a risk factor for osteoporosis.  Weaken the tissues that attach bones to muscles (tendons). This can lead to shoulder, back, and other joint injuries.  Increase your risk of rheumatoid arthritis or make the condition worse if you already have it.  Slow down healing and increase your risk of infection and other complications if you have a bone fracture or surgery that involves your musculoskeletal system.  Make you get out of breath easily. This can keep you from getting the exercise you need to keep your bones and joints healthy.  Decrease your appetite and body mass. You may lose weight and muscle strength. This can put you at higher risk for muscle injury, joint injury, and broken bones. What actions can I take to prevent musculoskeletal problems? Quit smoking      Do not start smoking. Quit if you already do. Even stopping later in life can improve musculoskeletal health.  Do not use any products that contain nicotine or tobacco. Do not replace cigarette smoking with e-cigarettes. The safety of e-cigarettes is not known, and some may contain harmful chemicals.  Make a plan to quit smoking and commit to it. Look for programs to help you, and ask your health care provider for recommendations and ideas.  Talk with your health care provider about using nicotine replacement medicines to help you quit, such as gum, lozenges, patches, sprays, or pills. Make other lifestyle changes   Eat a healthy diet  that includes calcium and vitamin D. These nutrients are important for bone health. ? Calcium is found in dairy foods and green leafy vegetables. ? Vitamin D is found in eggs, fish, and liver. ? Many foods also have vitamin D and calcium added to them (are fortified). ? Ask your health care provider if you would benefit from taking a supplement.  Get out in the sunshine  for a short time every day. This increases production of vitamin D.  Get 30 minutes of exercise at least 5 days a week. Weight-bearing and strength exercises are best for musculoskeletal health. Ask your health care provider what type of exercise is safe for you.  Do not drink alcohol if: ? Your health care provider tells you not to drink. ? You are pregnant, may be pregnant, or are planning to become pregnant.  If you drink alcohol, limit how much you have: ? 0-1 drink a day for women. ? 0-2 drinks a day for men.  Be aware of how much alcohol is in your drink. In the [place]., one drink equals one 12 oz bottle of beer (355 mL), one 5 oz glass of wine (148 mL), or one 1 oz glass of hard liquor (44 mL). Where to find more information You may find more information about smoking, musculoskeletal health, and quitting smoking from:  [place] [employer]: orthoinfo.aaos.[place], Osteoporosis and Related [place]: bones.SouthExposed.es  HelpGuide.org: helpguide.org  https://hall.com/: smokefree.gov  American Lung Association: lung.org Contact a health care provider if:  You need help to quit smoking. Summary  When you smoke, the effects on your lungs and heart result in less oxygen for your musculoskeletal system.  Even stopping smoking later in life can improve musculoskeletal health.  Do not use any products that contain nicotine or tobacco, such as cigarettes and e-cigarettes.  If you need help quitting, ask your health care provider. This information is not intended to replace advice given to you by your health care provider. Make sure you discuss any questions you have with your health care provider. Document Revised: 12/24/2018 Document Reviewed: 07/27/2017 Elsevier Patient Education  [place].

## 2019-11-23 NOTE — Progress Notes (Signed)
NEW PROBLEM//OFFICE VISIT  Chief Complaint  Patient presents with  . Hip Pain    right groin painful   . Back Pain    pain into right thigh and back pain "at times"   59 year old female presents to [place] with a 2-year history of pain in her right groin.  The pain is progressively worsened over the last 2 years causing her last year to pick up a cane to try to relieve the pain.  It has gotten worse in the last 2 months.  She now has difficulty getting dressed difficulty putting her socks on difficulty bending over and difficulty walking  Review of systems is notable for back pain on the right side which is intermittent but not associated with any leg pain  She is also a smoker who tried to quit but was unable to do so    Past Medical History: No date: Bronchitis 06/25/2015: Cigarette nicotine dependence without complication No date: COPD (chronic obstructive pulmonary disease) ([place]) 06/25/2015: Ganglion cyst of wrist No date: Migraines  Dr Moshe Cipro PMD     Review of Systems  All other systems reviewed and are negative.   Current Outpatient Medications:  .  acetaminophen (TYLENOL) 500 MG tablet, Take 1,000 mg by mouth every 6 (six) hours as needed for moderate pain or headache., Disp: , Rfl:  .  albuterol (VENTOLIN HFA) 108 (90 Base) MCG/ACT inhaler, Inhale 2 puffs into the lungs every 6 (six) hours as needed for wheezing or shortness of breath., Disp: 8 g, Rfl: 1 .  budesonide-formoterol (SYMBICORT) 80-4.5 MCG/ACT inhaler, Inhale 2 puffs into the lungs 2 (two) times daily., Disp: 1 Inhaler, Rfl: 3 .  diphenhydrAMINE (BENADRYL) 25 MG tablet, Take 25 mg by mouth daily as needed for allergies., Disp: , Rfl:  .  Menthol, Topical Analgesic, (BENGAY EX), Apply 1 application topically daily as needed (leg pain)., Disp: , Rfl:  .  Multiple Vitamin (MULTIVITAMIN WITH MINERALS) TABS tablet, Take 1 tablet by mouth daily., Disp: , Rfl:  .  vitamin B-12 (CYANOCOBALAMIN) 500 MCG tablet, Take 500  mcg by mouth daily., Disp: , Rfl:  .  nicotine (NICODERM CQ - DOSED IN MG/24 HOURS) 14 mg/24hr patch, Place 1 patch (14 mg total) onto the skin daily., Disp: 28 patch, Rfl: 0 .  traMADol-acetaminophen (ULTRACET) 37.5-325 MG tablet, Take 1 tablet by mouth every 4 (four) hours as needed for up to 5 days., Disp: 30 tablet, Rfl: 0   Past Medical History:  Diagnosis Date  . Bronchitis   . Cigarette nicotine dependence without complication 0/34/7425  . COPD (chronic obstructive pulmonary disease) ([place])   . Ganglion cyst of wrist 06/25/2015  . Migraines     Past Surgical History:  Procedure Laterality Date  . APPENDECTOMY    . CHOLECYSTECTOMY    . KNEE ARTHROPLASTY     left knee  . TUBAL LIGATION  1987    Family History  Problem Relation Age of Onset  . Diabetes Mother   . Hypertension Mother   . Heart disease Mother        CHF  . Stroke Mother   . Heart disease Father    Social History   Tobacco Use  . Smoking status: Current Every Day Smoker    Packs/day: 0.50    Years: 30.00    Pack years: 15.00    Types: Cigarettes  . Smokeless tobacco: Never Used  Vaping Use  . Vaping Use: Never used  Substance Use Topics  . Alcohol  use: No  . Drug use: Yes    Frequency: 6.0 times per week    Types: Marijuana    Allergies  Allergen Reactions  . Codeine Nausea And Vomiting and Rash    Current Meds  Medication Sig  . acetaminophen (TYLENOL) 500 MG tablet Take 1,000 mg by mouth every 6 (six) hours as needed for moderate pain or headache.  . albuterol (VENTOLIN HFA) 108 (90 Base) MCG/ACT inhaler Inhale 2 puffs into the lungs every 6 (six) hours as needed for wheezing or shortness of breath.  . budesonide-formoterol (SYMBICORT) 80-4.5 MCG/ACT inhaler Inhale 2 puffs into the lungs 2 (two) times daily.  . diphenhydrAMINE (BENADRYL) 25 MG tablet Take 25 mg by mouth daily as needed for allergies.  . Menthol, Topical Analgesic, (BENGAY EX) Apply 1 application topically daily as needed  (leg pain).  . Multiple Vitamin (MULTIVITAMIN WITH MINERALS) TABS tablet Take 1 tablet by mouth daily.  . vitamin B-12 (CYANOCOBALAMIN) 500 MCG tablet Take 500 mcg by mouth daily.    BP 140/87   Pulse 98   Ht 5' 2.5" (1.588 m)   Wt 213 lb (96.6 kg)   LMP 04/14/2010   BMI 38.34 kg/m   Physical Exam Normal grooming and hygiene awake alert and oriented x3 mood and affect normal she does have a protuberant abdomen large thighs   Ortho Exam  She struggles to ambulate with a cane but it is in the wrong hand  She has painful hip flexion though not limited she has normal external rotation which actually relieves some of her hip pain only to be exacerbated by internal rotation which is limited to 10 degrees  She does not appear to have a leg length discrepancy  She has tenderness in her lower back and tenderness in the right iliac crest area.  She has mild peripheral edema but normal color capillary refill and temperature of both extremities  MEDICAL DECISION MAKING  A.  Encounter Diagnoses  Name Primary?  . Pain in right hip Yes  . Lumbar pain   . Tobacco use     B. DATA ANALYSED:    IMAGING: Independent interpretation of images: We did x-rays today in the office x-ray #1 pelvis AP lateral right hip severe degeneration of the right hip good cortical bone in the femur and proximal femur but degeneration of the hip include cysts and severe joint space narrowing.  She also has a lumbar series AP lateral and spot film which show some degeneration of the facet joint spondylosis a mild grade 1 spondylolisthesis of L4 on 5   C. MANAGEMENT   The patient will need surgery.  She will need to stop smoking I put her on nicotine patch.  Nicotine patch  Consult w/ Dr Rush Farmer  Seen by PMD last month   Meds ordered this encounter  Medications  . nicotine (NICODERM CQ - DOSED IN MG/24 HOURS) 14 mg/24hr patch    Sig: Place 1 patch (14 mg total) onto the skin daily.    Dispense:   28 patch    Refill:  0  . traMADol-acetaminophen (ULTRACET) 37.5-325 MG tablet    Sig: Take 1 tablet by mouth every 4 (four) hours as needed for up to 5 days.    Dispense:  30 tablet    Refill:  0    Chronic with exacerbation prescription management  Arther Abbott, MD  11/23/2019 12:50 PM

## 2019-11-25 ENCOUNTER — Other Ambulatory Visit: Payer: Self-pay

## 2019-11-25 ENCOUNTER — Other Ambulatory Visit (HOSPITAL_COMMUNITY)
Admission: RE | Admit: 2019-11-25 | Discharge: 2019-11-25 | Disposition: A | Discharge status: Home / Self Care | Payer: 59 | Source: Ambulatory Visit | Attending: Gastroenterology | Admitting: Gastroenterology

## 2019-11-25 DIAGNOSIS — Z01812 Encounter for preprocedural laboratory examination: Secondary | ICD-10-CM | POA: Insufficient documentation

## 2019-11-25 DIAGNOSIS — Z20822 Contact with and (suspected) exposure to covid-19: Secondary | ICD-10-CM | POA: Insufficient documentation

## 2019-11-25 LAB — SARS CORONAVIRUS 2 (TAT 6-24 HRS): SARS Coronavirus 2: NEGATIVE

## 2019-11-29 ENCOUNTER — Ambulatory Visit (HOSPITAL_COMMUNITY): Payer: 59 | Admitting: Anesthesiology

## 2019-11-29 ENCOUNTER — Other Ambulatory Visit: Payer: Self-pay

## 2019-11-29 ENCOUNTER — Encounter (HOSPITAL_COMMUNITY)
Admission: RE | Disposition: A | Discharge status: Home / Self Care | Payer: Self-pay | Source: Home / Self Care | Attending: Gastroenterology

## 2019-11-29 ENCOUNTER — Encounter (HOSPITAL_COMMUNITY): Payer: Self-pay | Admitting: Gastroenterology

## 2019-11-29 ENCOUNTER — Ambulatory Visit (HOSPITAL_COMMUNITY)
Admission: RE | Admit: 2019-11-29 | Discharge: 2019-11-29 | Disposition: A | Discharge status: Home / Self Care | Payer: 59 | Attending: Gastroenterology | Admitting: Gastroenterology

## 2019-11-29 DIAGNOSIS — Z7951 Long term (current) use of inhaled steroids: Secondary | ICD-10-CM | POA: Insufficient documentation

## 2019-11-29 DIAGNOSIS — J449 Chronic obstructive pulmonary disease, unspecified: Secondary | ICD-10-CM | POA: Diagnosis not present

## 2019-11-29 DIAGNOSIS — Z1211 Encounter for screening for malignant neoplasm of colon: Secondary | ICD-10-CM

## 2019-11-29 DIAGNOSIS — F17210 Nicotine dependence, cigarettes, uncomplicated: Secondary | ICD-10-CM | POA: Diagnosis not present

## 2019-11-29 DIAGNOSIS — M1990 Unspecified osteoarthritis, unspecified site: Secondary | ICD-10-CM | POA: Insufficient documentation

## 2019-11-29 DIAGNOSIS — D122 Benign neoplasm of ascending colon: Secondary | ICD-10-CM

## 2019-11-29 DIAGNOSIS — I10 Essential (primary) hypertension: Secondary | ICD-10-CM | POA: Diagnosis not present

## 2019-11-29 DIAGNOSIS — D124 Benign neoplasm of descending colon: Secondary | ICD-10-CM | POA: Insufficient documentation

## 2019-11-29 DIAGNOSIS — Z96652 Presence of left artificial knee joint: Secondary | ICD-10-CM | POA: Diagnosis not present

## 2019-11-29 DIAGNOSIS — Z79899 Other long term (current) drug therapy: Secondary | ICD-10-CM | POA: Insufficient documentation

## 2019-11-29 DIAGNOSIS — K6389 Other specified diseases of intestine: Secondary | ICD-10-CM | POA: Diagnosis not present

## 2019-11-29 DIAGNOSIS — K648 Other hemorrhoids: Secondary | ICD-10-CM

## 2019-11-29 DIAGNOSIS — D175 Benign lipomatous neoplasm of intra-abdominal organs: Secondary | ICD-10-CM | POA: Diagnosis not present

## 2019-11-29 HISTORY — PX: BIOPSY: SHX5522

## 2019-11-29 HISTORY — PX: POLYPECTOMY: SHX5525

## 2019-11-29 HISTORY — PX: COLONOSCOPY WITH PROPOFOL: SHX5780

## 2019-11-29 SURGERY — COLONOSCOPY WITH PROPOFOL
Anesthesia: General

## 2019-11-29 MED ORDER — ONDANSETRON HCL 4 MG/2ML IJ SOLN
INTRAMUSCULAR | Status: DC | PRN
  Administered 2019-11-29: 4 mg via INTRAVENOUS

## 2019-11-29 MED ORDER — FENTANYL CITRATE (PF) 100 MCG/2ML IJ SOLN
INTRAMUSCULAR | Status: DC | PRN
  Administered 2019-11-29: 25 ug via INTRAVENOUS

## 2019-11-29 MED ORDER — PROPOFOL 500 MG/50ML IV EMUL
INTRAVENOUS | Status: DC | PRN
  Administered 2019-11-29 (×2): 100 ug/kg/min via INTRAVENOUS
  Administered 2019-11-29: 75 ug/kg/min via INTRAVENOUS
  Administered 2019-11-29: 100 ug/kg/min via INTRAVENOUS

## 2019-11-29 MED ORDER — PHENYLEPHRINE HCL (PRESSORS) 10 MG/ML IV SOLN
INTRAVENOUS | Status: DC | PRN
  Administered 2019-11-29: 120 ug via INTRAVENOUS

## 2019-11-29 MED ORDER — SODIUM CHLORIDE 0.9 % IV SOLN: INTRAVENOUS | Status: DC | PRN

## 2019-11-29 MED ORDER — LACTATED RINGERS IV SOLN: INTRAVENOUS | Status: DC | PRN

## 2019-11-29 MED ORDER — STERILE WATER FOR IRRIGATION IR SOLN
Status: DC | PRN
  Administered 2019-11-29: 1.5 mL

## 2019-11-29 MED ORDER — CHLORHEXIDINE GLUCONATE CLOTH 2 % EX PADS: 6.0000 | MEDICATED_PAD | Freq: Once | CUTANEOUS | Status: DC

## 2019-11-29 MED ORDER — LACTATED RINGERS IV SOLN: Freq: Once | INTRAVENOUS | Status: AC

## 2019-11-29 MED ORDER — FENTANYL CITRATE (PF) 100 MCG/2ML IJ SOLN
INTRAMUSCULAR | Status: AC
  Filled 2019-11-29: qty 2

## 2019-11-29 MED ORDER — LIDOCAINE HCL (CARDIAC) PF 50 MG/5ML IV SOSY
PREFILLED_SYRINGE | INTRAVENOUS | Status: DC | PRN
  Administered 2019-11-29: 100 mg via INTRAVENOUS
  Administered 2019-11-29: 60 mg via INTRAVENOUS

## 2019-11-29 MED ORDER — ONDANSETRON HCL 4 MG/2ML IJ SOLN
INTRAMUSCULAR | Status: AC
  Filled 2019-11-29: qty 2

## 2019-11-29 MED ORDER — METOCLOPRAMIDE HCL 5 MG/ML IJ SOLN
INTRAMUSCULAR | Status: AC
  Filled 2019-11-29: qty 2

## 2019-11-29 MED ORDER — METOCLOPRAMIDE HCL 5 MG/ML IJ SOLN
INTRAMUSCULAR | Status: DC | PRN
  Administered 2019-11-29: 10 mg via INTRAVENOUS

## 2019-11-29 MED ORDER — PROPOFOL 10 MG/ML IV BOLUS
INTRAVENOUS | Status: AC
  Filled 2019-11-29: qty 80

## 2019-11-29 MED ORDER — PROPOFOL 10 MG/ML IV BOLUS
INTRAVENOUS | Status: DC | PRN
  Administered 2019-11-29: 40 mg via INTRAVENOUS
  Administered 2019-11-29: 30 mg via INTRAVENOUS
  Administered 2019-11-29: 20 mg via INTRAVENOUS
  Administered 2019-11-29: 100 mg via INTRAVENOUS
  Administered 2019-11-29: 40 mg via INTRAVENOUS
  Administered 2019-11-29: 30 mg via INTRAVENOUS
  Administered 2019-11-29: 120 mg via INTRAVENOUS
  Administered 2019-11-29: 20 mg via INTRAVENOUS
  Administered 2019-11-29: 100 mg via INTRAVENOUS
  Administered 2019-11-29: 20 mg via INTRAVENOUS

## 2019-11-29 MED ORDER — GLYCOPYRROLATE 0.2 MG/ML IJ SOLN
INTRAMUSCULAR | Status: DC | PRN
  Administered 2019-11-29: .2 mg via INTRAVENOUS

## 2019-11-29 NOTE — Anesthesia Preprocedure Evaluation (Addendum)
Anesthesia Evaluation  Patient identified by MRN, date of birth, ID band Patient awake    Reviewed: Allergy & Precautions, NPO status , Patient's Chart, lab work & pertinent test results  History of Anesthesia Complications Negative for: history of anesthetic complications  Airway Mallampati: III  TM Distance: >3 FB Neck ROM: Full    Dental  (+) Dental Advisory Given, Missing   Pulmonary COPD,  COPD inhaler, Current Smoker and Patient abstained from smoking.,    Pulmonary exam normal breath sounds clear to auscultation       Cardiovascular hypertension, Pt. on medications Normal cardiovascular exam Rhythm:Regular Rate:Normal     Neuro/Psych  Headaches,    GI/Hepatic negative GI ROS, Neg liver ROS,   Endo/Other  negative endocrine ROS  Renal/GU negative Renal ROS     Musculoskeletal  (+) Arthritis  (right hip pain), Osteoarthritis,    Abdominal   Peds  Hematology negative hematology ROS (+)   Anesthesia Other Findings   Reproductive/Obstetrics                          Anesthesia Physical Anesthesia Plan  ASA: II  Anesthesia Plan: General   Post-op Pain Management:    Induction: Intravenous  PONV Risk Score and Plan:   Airway Management Planned: Nasal Cannula, Natural Airway and Simple Face Mask  Additional Equipment:   Intra-op Plan:   Post-operative Plan:   Informed Consent: I have reviewed the patients History and Physical, chart, labs and discussed the procedure including the risks, benefits and alternatives for the proposed anesthesia with the patient or authorized representative who has indicated his/her understanding and acceptance.     Dental advisory given  Plan Discussed with: CRNA and Surgeon  Anesthesia Plan Comments:       Anesthesia Quick Evaluation

## 2019-11-29 NOTE — Transfer of Care (Signed)
Immediate Anesthesia Transfer of Care Note  Patient: Michele Romero  Procedure(s) Performed: COLONOSCOPY WITH PROPOFOL (N/A ) POLYPECTOMY BIOPSY  Patient Location: Endoscopy Unit  Anesthesia Type:General  Level of Consciousness: awake, alert  and patient cooperative  Airway & Oxygen Therapy: Patient Spontanous Breathing  Post-op Assessment: Report given to RN and Post -op Vital signs reviewed and stable  Post vital signs: Reviewed and stable  Last Vitals:  Vitals Value Taken Time  BP    Temp 36.9 C 11/29/19 1228  Pulse 86 11/29/19 1228  Resp 17 11/29/19 1228  SpO2 100 % 11/29/19 1228    Last Pain:  Vitals:   11/29/19 1228  TempSrc: Oral  PainSc:       Patients Stated Pain Goal: 8 (86/16/83 7290)  Complications: No complications documented.

## 2019-11-29 NOTE — Anesthesia Postprocedure Evaluation (Signed)
Anesthesia Post Note  Patient: Michele Romero  Procedure(s) Performed: COLONOSCOPY WITH PROPOFOL (N/A ) POLYPECTOMY BIOPSY  Patient location during evaluation: Endoscopy Anesthesia Type: General Level of consciousness: awake and alert and patient cooperative Pain management: satisfactory to patient Vital Signs Assessment: post-procedure vital signs reviewed and stable Respiratory status: spontaneous breathing Cardiovascular status: stable Postop Assessment: no apparent nausea or vomiting Anesthetic complications: no Comments: Vomiting and second IV discussed with patient. Patient states understanding.   No complications documented.   Last Vitals:  Vitals:   11/29/19 0834 11/29/19 1228  BP: 107/69   Pulse: 84 86  Resp: 12 17  Temp: 36.7 C 36.9 C  SpO2: 98% 100%    Last Pain:  Vitals:   11/29/19 1228  TempSrc: Oral  PainSc:                  Drucie Opitz

## 2019-11-29 NOTE — Discharge Instructions (Signed)
Colonoscopy, Adult, Care After This sheet gives you information about how to care for yourself after your procedure. Your health care provider may also give you more specific instructions. If you have problems or questions, contact your health care provider. What can I expect after the procedure? After the procedure, it is common to have:  A small amount of blood in your stool for 24 hours after the procedure.  Some gas.  Mild cramping or bloating of your abdomen. Follow these instructions at home: Eating and drinking   Drink enough fluid to keep your urine pale yellow.  Follow instructions from your health care provider about eating or drinking restrictions.  Resume your normal diet as instructed by your health care provider. Avoid heavy or fried foods that are hard to digest. Activity  Rest as told by your health care provider.  Avoid sitting for a long time without moving. Get up to take short walks every 1-2 hours. This is important to improve blood flow and breathing. Ask for help if you feel weak or unsteady.  Return to your normal activities as told by your health care provider. Ask your health care provider what activities are safe for you. Managing cramping and bloating   Try walking around when you have cramps or feel bloated.  Apply heat to your abdomen as told by your health care provider. Use the heat source that your health care provider recommends, such as a moist heat pack or a heating pad. ? Place a towel between your skin and the heat source. ? Leave the heat on for 20-30 minutes. ? Remove the heat if your skin turns bright red. This is especially important if you are unable to feel pain, heat, or cold. You may have a greater risk of getting burned. General instructions  For the first 24 hours after the procedure: ? Do not drive or use machinery. ? Do not sign important documents. ? Do not drink alcohol. ? Do your regular daily activities at a slower pace  than normal. ? Eat soft foods that are easy to digest.  Take over-the-counter and prescription medicines only as told by your health care provider.  Keep all follow-up visits as told by your health care provider. This is important. Contact a health care provider if:  You have blood in your stool 2-3 days after the procedure. Get help right away if you have:  More than a small spotting of blood in your stool.  Large blood clots in your stool.  Swelling of your abdomen.  Nausea or vomiting.  A fever.  Increasing pain in your abdomen that is not relieved with medicine. Summary  After the procedure, it is common to have a small amount of blood in your stool. You may also have mild cramping and bloating of your abdomen.  For the first 24 hours after the procedure, do not drive or use machinery, sign important documents, or drink alcohol.  Get help right away if you have a lot of blood in your stool, nausea or vomiting, a fever, or increased pain in your abdomen. This information is not intended to replace advice given to you by your health care provider. Make sure you discuss any questions you have with your health care provider. Document Revised: 10/25/2018 Document Reviewed: 10/25/2018 Elsevier Patient Education  2020 Elsevier Inc.   Colon Polyps  Polyps are tissue growths inside the body. Polyps can grow in many places, including the large intestine (colon). A polyp may be a round   bump or a mushroom-shaped growth. You could have one polyp or several. Most colon polyps are noncancerous (benign). However, some colon polyps can become cancerous over time. Finding and removing the polyps early can help prevent this. What are the causes? The exact cause of colon polyps is not known. What increases the risk? You are more likely to develop this condition if you:  Have a family history of colon cancer or colon polyps.  Are older than 50 or older than 45 if you are African  American.  Have inflammatory bowel disease, such as ulcerative colitis or Crohn's disease.  Have certain hereditary conditions, such as: ? Familial adenomatous polyposis. ? Lynch syndrome. ? Turcot syndrome. ? Peutz-Jeghers syndrome.  Are overweight.  Smoke cigarettes.  Do not get enough exercise.  Drink too much alcohol.  Eat a diet that is high in fat and red meat and low in fiber.  Had childhood cancer that was treated with abdominal radiation. What are the signs or symptoms? Most polyps do not cause symptoms. If you have symptoms, they may include:  Blood coming from your rectum when having a bowel movement.  Blood in your stool. The stool may look dark red or black.  Abdominal pain.  A change in bowel habits, such as constipation or diarrhea. How is this diagnosed? This condition is diagnosed with a colonoscopy. This is a procedure in which a lighted, flexible scope is inserted into the anus and then passed into the colon to examine the area. Polyps are sometimes found when a colonoscopy is done as part of routine cancer screening tests. How is this treated? Treatment for this condition involves removing any polyps that are found. Most polyps can be removed during a colonoscopy. Those polyps will then be tested for cancer. Additional treatment may be needed depending on the results of testing. Follow these instructions at home: Lifestyle  Maintain a healthy weight, or lose weight if recommended by your health care provider.  Exercise every day or as told by your health care provider.  Do not use any products that contain nicotine or tobacco, such as cigarettes and e-cigarettes. If you need help quitting, ask your health care provider.  If you drink alcohol, limit how much you have: ? 0-1 drink a day for women. ? 0-2 drinks a day for men.  Be aware of how much alcohol is in your drink. In the [place] one drink equals one 12 oz bottle of beer (355 mL), one 5 oz glass  of wine (148 mL), or one 1 oz shot of hard liquor (44 mL). Eating and drinking   Eat foods that are high in fiber, such as fruits, vegetables, and whole grains.  Eat foods that are high in calcium and vitamin D, such as milk, cheese, yogurt, eggs, liver, fish, and broccoli.  Limit foods that are high in fat, such as fried foods and desserts.  Limit the amount of red meat and processed meat you eat, such as hot dogs, sausage, bacon, and lunch meats. General instructions  Keep all follow-up visits as told by your health care provider. This is important. ? This includes having regularly scheduled colonoscopies. ? Talk to your health care provider about when you need a colonoscopy. Contact a health care provider if:  You have new or worsening bleeding during a bowel movement.  You have new or increased blood in your stool.  You have a change in bowel habits.  You lose weight for no known reason. Summary    tissue growths inside the body. Polyps can grow in many places, including the colon.  Most colon polyps are noncancerous (benign), but some can become cancerous over time.  This condition is diagnosed with a colonoscopy.  Treatment for this condition involves removing any polyps that are found. Most polyps can be removed during a colonoscopy. This information is not intended to replace advice given to you by your health care provider. Make sure you discuss any questions you have with your health care provider. Document Revised: 07/16/2017 Document Reviewed: 07/16/2017 Elsevier Patient Education  2020 [place] are being discharged to home.  Resume your previous diet.  We are waiting for your pathology results.  Your physician has recommended a repeat colonoscopy in two years for surveillance.

## 2019-11-29 NOTE — Op Note (Addendum)
[facility] Patient Name: Michele Romero Procedure Date: 11/29/2019 10:22 AM MRN: [id number] Date of Birth: 1960-07-16 Attending MD: Maylon Peppers ,  CSN: [id number] Age: 59 Admit Type: Outpatient Procedure:                Colonoscopy Indications:              Screening for colorectal malignant neoplasm Providers:                Maylon Peppers Referring MD:              Medicines:                Monitored Anesthesia Care Complications:            No immediate complications. Estimated Blood Loss:     Estimated blood loss: none. Procedure:                Pre-Anesthesia Assessment:                           - Prior to the procedure, a History and Physical                            was performed, and patient medications, allergies                            and sensitivities were reviewed. The patient's                            tolerance of previous anesthesia was reviewed.                           - The risks and benefits of the procedure and the                            sedation options and risks were discussed with the                            patient. All questions were answered and informed                            consent was obtained.                           - ASA Grade Assessment: II - A patient with mild                            systemic disease.                           After obtaining informed consent, the colonoscope                            was passed under direct vision. Throughout the                            procedure, the patient's blood pressure, pulse, and  oxygen saturations were monitored continuously. The                            [id number]-[id number] ([id number]) scope was introduced through                            the anus and advanced to the the terminal ileum.                            The colonoscopy was performed without difficulty.                            The patient tolerated the procedure poorly due to                             episode of vomiting and bradycardia, which                            eventually improved. The quality of the bowel                            preparation was good. Scope withdrawal time was 30                            minutes. Scope In: 10:41:45 AM Scope Out: 12:20:53 PM Scope Withdrawal Time: 1 hour 11 minutes 39 seconds  Total Procedure Duration: 1 hour 39 minutes 8 seconds  Findings:      The perianal and digital rectal examinations were normal.      A 30 mm polyp was found in the ascending colon. The polyp was       semi-sessile. The polyp was removed with a hot snare. Resection and       retrieval were complete.      A diffuse area of granular mucosa was found at the ileocecal valve.       Imaging was performed using white light and narrow band imaging to       visualize the mucosa. Biopsies were taken with a cold forceps for       histology.      A 5 mm polyp was found in the ascending colon. The polyp was sessile.       The polyp was removed with a cold snare. Resection and retrieval were       complete.      There was a large lipoma, 25 mm in diameter, in the transverse colon.      Three sessile polyps were found in the descending colon. The polyps were       5 to 10 mm in size. These polyps were removed with a cold snare.       Resection and retrieval were complete.      A 4 mm polyp was found in the sigmoid colon. The polyp was sessile. The       polyp was removed with a cold snare. Resection was complete, but the       polyp tissue was not retrieved.      Non-bleeding internal hemorrhoids were found during retroflexion. The       hemorrhoids were medium-sized. Impression:               -  One 30 mm polyp in the ascending colon, removed                            with a hot snare. Resected and retrieved.                           - Granularity at the ileocecal valve. Biopsied.                           - One 5 mm polyp in the ascending colon, removed                             with a cold snare. Resected and retrieved.                           - Large lipoma in the transverse colon.                           - Three 5 to 10 mm polyps in the descending colon,                            removed with a cold snare. Resected and retrieved.                           - One 4 mm polyp in the sigmoid colon, removed with                            a cold snare. Complete resection. Polyp tissue not                            retrieved.                           - Non-bleeding internal hemorrhoids. Moderate Sedation:      Per Anesthesia Care Recommendation:           - Discharge patient to home (ambulatory).                           - Resume previous diet.                           - Await pathology results.                           - Repeat colonoscopy in 2 years for surveillance. Procedure Code(s):        --- Professional ---                           [phone], GC, Colonoscopy, flexible; with removal of                            tumor(s), polyp(s), or other lesion(s) by snare  technique                           45380, 59, Colonoscopy, flexible; with biopsy,                            single or multiple Diagnosis Code(s):        --- Professional ---                           K63.5, Polyp of colon                           Z12.11, Encounter for screening for malignant                            neoplasm of colon                           K63.89, Other specified diseases of intestine                           D17.5, Benign lipomatous neoplasm of                            intra-abdominal organs                           K64.8, Other hemorrhoids CPT copyright 2019 American Medical Association. All rights reserved. The codes documented in this report are preliminary and upon coder review may  be revised to meet current compliance requirements. Maylon Peppers, MD Maylon Peppers,  11/29/2019 12:29:51 PM This  report has been signed electronically. Number of Addenda: 0

## 2019-11-29 NOTE — H&P (Signed)
Michele Romero is an 59 y.o. female.   Chief Complaint: screening colonoscopy HPI: Briefly this is a 59 year old female with past medical history of COPD, bronchitis, who comes to the hospital to undergo screening colonoscopy.  She has never had a colonoscopy in the past.  Denies any family history of colorectal cancer.  Denies having any abdominal pain, melena, hematochezia, changes in her bowel movement consistency or size.  However, she endorses losing weight unintentionally, close to 20 pounds in the last 6 months.  Past Medical History:  Diagnosis Date  . Bronchitis   . Cigarette nicotine dependence without complication 9/83/3825  . COPD (chronic obstructive pulmonary disease) ([place])   . Ganglion cyst of wrist 06/25/2015  . Migraines     Past Surgical History:  Procedure Laterality Date  . APPENDECTOMY    . CHOLECYSTECTOMY    . KNEE ARTHROPLASTY     left knee  . TUBAL LIGATION  1987    Family History  Problem Relation Age of Onset  . Diabetes Mother   . Hypertension Mother   . Heart disease Mother        CHF  . Stroke Mother   . Heart disease Father    Social History:  reports that she has been smoking cigarettes. She has a 15.00 pack-year smoking history. She has never used smokeless tobacco. She reports current drug use. Frequency: 6.00 times per week. Drug: Marijuana. She reports that she does not drink alcohol.  Allergies:  Allergies  Allergen Reactions  . Codeine Nausea And Vomiting and Rash    Medications Prior to Admission  Medication Sig Dispense Refill  . acetaminophen (TYLENOL) 500 MG tablet Take 1,000 mg by mouth every 6 (six) hours as needed for moderate pain or headache.    . albuterol (VENTOLIN HFA) 108 (90 Base) MCG/ACT inhaler Inhale 2 puffs into the lungs every 6 (six) hours as needed for wheezing or shortness of breath. 8 g 1  . budesonide-formoterol (SYMBICORT) 80-4.5 MCG/ACT inhaler Inhale 2 puffs into the lungs 2 (two) times daily. 1 Inhaler 3  .  diphenhydrAMINE (BENADRYL) 25 MG tablet Take 25 mg by mouth daily as needed for allergies.    . Menthol, Topical Analgesic, (BENGAY EX) Apply 1 application topically daily as needed (leg pain).    . Multiple Vitamin (MULTIVITAMIN WITH MINERALS) TABS tablet Take 1 tablet by mouth daily.    . vitamin B-12 (CYANOCOBALAMIN) 500 MCG tablet Take 500 mcg by mouth daily.    . nicotine (NICODERM CQ - DOSED IN MG/24 HOURS) 14 mg/24hr patch Place 1 patch (14 mg total) onto the skin daily. 28 patch 0    No results found for this or any previous visit (from the past 48 hour(s)). No results found.  Review of Systems  Constitutional: Positive for unexpected weight change.  HENT: Negative.   Eyes: Negative.   Respiratory: Negative.   Cardiovascular: Negative.   Gastrointestinal: Negative.   Endocrine: Negative.   Genitourinary: Negative.   Musculoskeletal: Negative.   Skin: Negative.   Allergic/Immunologic: Negative.   Neurological: Negative.   Hematological: Negative.   Psychiatric/Behavioral: Negative.     Blood pressure 107/69, pulse 84, temperature 98 F (36.7 C), temperature source Oral, resp. rate 12, height 5' 2.5" (1.588 m), weight 97.1 kg, last menstrual period 04/14/2010, SpO2 98 %. Physical Exam  GENERAL: The patient is AO x3, in no acute distress. Obese. HEENT: Head is normocephalic and atraumatic. EOMI are intact. Mouth is well hydrated and without lesions. NECK:  Supple. No masses LUNGS: Clear to auscultation. No presence of rhonchi/wheezing/rales. Adequate chest expansion HEART: RRR, normal s1 and s2. ABDOMEN: Soft, nontender, no guarding, no peritoneal signs, and nondistended. BS +. No masses. EXTREMITIES: Without any cyanosis, clubbing, rash, lesions or edema. NEUROLOGIC: AOx3, no focal motor deficit. SKIN: no jaundice, no rashes  Assessment/Plan 59 year old female with past medical history of COPD, bronchitis, who comes to the hospital to undergo screening colonoscopy.  She  is at average risk for colorectal cancer.  We will proceed with colonoscopy today.  Harvel Quale, MD 11/29/2019, 9:14 AM

## 2019-11-29 NOTE — Anesthesia Procedure Notes (Signed)
Date/Time: 11/29/2019 10:35 AM Performed by: Vista Deck, CRNA Pre-anesthesia Checklist: Patient identified, Emergency Drugs available, Suction available, Timeout performed and Patient being monitored Patient Re-evaluated:Patient Re-evaluated prior to induction Oxygen Delivery Method: Nasal Cannula

## 2019-12-01 ENCOUNTER — Encounter (HOSPITAL_COMMUNITY): Payer: Self-pay | Admitting: Gastroenterology

## 2019-12-01 LAB — SURGICAL PATHOLOGY

## 2019-12-06 ENCOUNTER — Ambulatory Visit (INDEPENDENT_AMBULATORY_CARE_PROVIDER_SITE_OTHER): Payer: 59 | Admitting: Orthopaedic Surgery

## 2019-12-06 ENCOUNTER — Encounter: Payer: Self-pay | Admitting: Orthopaedic Surgery

## 2019-12-06 VITALS — Ht 61.0 in | Wt 219.2 lb

## 2019-12-06 DIAGNOSIS — M1611 Unilateral primary osteoarthritis, right hip: Secondary | ICD-10-CM

## 2019-12-06 NOTE — Progress Notes (Signed)
Office Visit Note   Patient: Michele Romero           Date of Birth: 06-Apr-1961           MRN: [id number] Visit Date: 12/06/2019              Requested by: Carole Civil, MD [address] [place],  [place] [address] PCP: Perlie Mayo, NP   Assessment & Plan: Visit Diagnoses:  1. Unilateral primary osteoarthritis, right hip   2. Severe obesity (BMI >= 40) (HCC)     Plan: I do feel that the patient would benefit from hip replacement surgery.  Given her BMI of over 40, she understands we have to wait until this is below 40 in order to be able to schedule her for the surgery.  I would like to see her back in about 6 weeks for repeat weight and BMI calculation.  All questions and concerns were answered and addressed.The patient meets the AMA guidelines for Morbid (severe) obesity with a BMI > 40.0 and I have recommended weight loss.  Follow-Up Instructions: Return in about 6 weeks (around 01/17/2020).   Orders:  No orders of the defined types were placed in this encounter.  No orders of the defined types were placed in this encounter.     Procedures: No procedures performed   Clinical Data: No additional findings.   Subjective: Chief Complaint  Patient presents with  . Right Hip - Pain  The patient is a very pleasant 59 year old female who was sent from Dr. Aline Brochure from [facility] to evaluate and treat severe arthritis of her right hip.  She has been getting more severe pain in the groin and around that hip for about 2 years now.  Standing for long period time causes her a lot of pain.  She has been sitting for a while and gets up she has severe pain in the groin.  She does ambulate with a cane and she does have a significant Trendelenburg gait when I watched her walk into the room.  She denies being a diabetic.  Today her BMI is measured at 41.42.  She has been using a cane for many years now.  At this point her right hip pain is 10 out of 10 and is  daily.  Her right hip pain is definitely affecting her mobility, her quality of life and her activities day living.  HPI  Review of Systems   Objective: Vital Signs: Ht 5\' 1"  (1.549 m)   Wt 219 lb 3.2 oz (99.4 kg)   LMP 04/14/2010   BMI 41.42 kg/m   Physical Exam She is alert and orient x3 and in no acute distress. Ortho Exam Examination of her right hip shows essentially almost no internal or external rotation with severe pain with attempts of rotation of that hip.  Her left hip moves normally.  She has a Trendelenburg gait and has problems standing straight upright due to the severity of her right hip pain when I watched her ambulate and get up. Specialty Comments:  No specialty comments available.  Imaging: No results found. Imaging on the canopy system independent reviewed show severe end-stage arthritis of the right hip.  There is severe sclerotic changes in the femoral head and acetabulum.  The joint space is significantly narrow and there are large osteophytes around the hip.  PMFS History: Patient Active Problem List   Diagnosis Date Noted  . Unilateral primary osteoarthritis, right hip 12/06/2019  .  Right hip pain 10/30/2019  . Encounter for screening for malignant neoplasm of colon 10/30/2019  . Screening mammogram, encounter for 10/30/2019  . Essential hypertension 10/30/2019  . Chronic obstructive pulmonary disease ([place]) 10/28/2019  . Prediabetes 01/20/2017  . Osteoarthritis of right knee 01/20/2017  . Complex tear of medial meniscus of right knee 01/20/2017  . Morbid obesity ([place]) 06/25/2015   Past Medical History:  Diagnosis Date  . Bronchitis   . Cigarette nicotine dependence without complication 07/08/7122  . COPD (chronic obstructive pulmonary disease) ([place])   . Ganglion cyst of wrist 06/25/2015  . Migraines     Family History  Problem Relation Age of Onset  . Diabetes Mother   . Hypertension Mother   . Heart disease Mother        CHF  . Stroke  Mother   . Heart disease Father     Past Surgical History:  Procedure Laterality Date  . APPENDECTOMY    . BIOPSY  11/29/2019   Procedure: BIOPSY;  Surgeon: Harvel Quale, MD;  Location: [facility];  Service: Gastroenterology;;  ileocecal vlve  . CHOLECYSTECTOMY    . COLONOSCOPY WITH PROPOFOL N/A 11/29/2019   Procedure: COLONOSCOPY WITH PROPOFOL;  Surgeon: Harvel Quale, MD;  Location: [facility];  Service: Gastroenterology;  Laterality: N/A;  930  . KNEE ARTHROPLASTY     left knee  . POLYPECTOMY  11/29/2019   Procedure: POLYPECTOMY;  Surgeon: Harvel Quale, MD;  Location: [facility];  Service: Gastroenterology;;  sigmoid colon   . TUBAL LIGATION  1987   Social History   Occupational History  . Not on file  Tobacco Use  . Smoking status: Current Every Day Smoker    Packs/day: 0.50    Years: 30.00    Pack years: 15.00    Types: Cigarettes  . Smokeless tobacco: Never Used  Vaping Use  . Vaping Use: Never used  Substance and Sexual Activity  . Alcohol use: No  . Drug use: Yes    Frequency: 6.0 times per week    Types: Marijuana  . Sexual activity: Not on file

## 2020-01-17 ENCOUNTER — Ambulatory Visit: Payer: 59 | Admitting: Orthopaedic Surgery

## 2020-01-24 ENCOUNTER — Encounter: Payer: Self-pay | Admitting: Orthopaedic Surgery

## 2020-01-24 ENCOUNTER — Ambulatory Visit (INDEPENDENT_AMBULATORY_CARE_PROVIDER_SITE_OTHER): Payer: 59 | Admitting: Orthopaedic Surgery

## 2020-01-24 VITALS — Ht 61.0 in | Wt 214.0 lb

## 2020-01-24 DIAGNOSIS — M1611 Unilateral primary osteoarthritis, right hip: Secondary | ICD-10-CM

## 2020-01-24 NOTE — Progress Notes (Signed)
The patient is well-known to me.  She has severe debilitating arthritis in her right hip and a significant Trendelenburg gait.  She needs hip replacement surgery.  However, according to her health insurance plan, her BMI needs to be below 40.  We last saw her 6 weeks ago and her BMI was almost 42.  She has worked on activity modification and her intake of calories.  Today her BMI is 40.43.  She still has severe pain in her right hip.  She understands that we still cannot posterior for surgery just yet.  I would like to see her back in 4 weeks.  At that visit I would like her weight again without any shoes on and limited clothing.  Hopefully at that point her BMI will be below 40 so we can go ahead and post her surgery because I do feel that she needs it based on the severity of arthritis combined with her pain and how this is affecting her quality of life and mobility overall.  She is walking significantly hunched over now due to this hip pain.  She is only 60 years old.  She understands what we are recommending.  Again we will see her back in 4 weeks for repeat weight and BMI calculation with limited garments on.

## 2020-01-31 ENCOUNTER — Other Ambulatory Visit: Payer: Self-pay

## 2020-01-31 ENCOUNTER — Encounter: Payer: Self-pay | Admitting: Family Medicine

## 2020-01-31 ENCOUNTER — Telehealth (INDEPENDENT_AMBULATORY_CARE_PROVIDER_SITE_OTHER): Payer: 59 | Admitting: Family Medicine

## 2020-01-31 VITALS — BP 128/88 | Ht 61.0 in | Wt 214.0 lb

## 2020-01-31 DIAGNOSIS — J449 Chronic obstructive pulmonary disease, unspecified: Secondary | ICD-10-CM

## 2020-01-31 DIAGNOSIS — I10 Essential (primary) hypertension: Secondary | ICD-10-CM | POA: Diagnosis not present

## 2020-01-31 DIAGNOSIS — M1611 Unilateral primary osteoarthritis, right hip: Secondary | ICD-10-CM

## 2020-01-31 MED ORDER — ALBUTEROL SULFATE HFA 108 (90 BASE) MCG/ACT IN AERS: 2.0000 | INHALATION_SPRAY | Freq: Four times a day (QID) | RESPIRATORY_TRACT | 1 refills | Status: DC | PRN

## 2020-01-31 MED ORDER — BUDESONIDE-FORMOTEROL FUMARATE 80-4.5 MCG/ACT IN AERO: 2.0000 | INHALATION_SPRAY | Freq: Two times a day (BID) | RESPIRATORY_TRACT | 2 refills | Status: DC

## 2020-01-31 NOTE — Progress Notes (Signed)
Virtual Visit via Telephone Note   This visit type was conducted due to national recommendations for restrictions regarding the COVID-19 Pandemic (e.g. social distancing) in an effort to limit this patient's exposure and mitigate transmission in our community.  Due to her co-morbid illnesses, this patient is at least at moderate risk for complications without adequate follow up.  This format is felt to be most appropriate for this patient at this time.  The patient did not have access to video technology/had technical difficulties with video requiring transitioning to audio format only (telephone).  All issues noted in this document were discussed and addressed.  No physical exam could be performed with this format.    Evaluation Performed:  Follow-up visit  Date:  01/31/2020   ID:  Michele Romero, DOB 08/21/1960, MRN [id number]  Patient Location: Home Provider Location: Office/Clinic  Location of Patient: Home Location of Provider: Telehealth Consent was obtain for visit to be over via telehealth. I verified that I am speaking with the correct person using two identifiers.  PCP:  Perlie Mayo, NP   Chief Complaint: Chronic conditions  History of Present Illness:    Michele Romero is a 59 y.o. female with history of COPD, migraines, prehypertension, meniscus tear in the right knee, osteoarthritis of the hip.  Today she reports that she stopped smoking last month.  She reports doing well with this and not having any issues.  She is breathing well needs refills on her inhalers but otherwise doing really well.  She is waiting to lose a little bit more weight to get her BMI under 40 so that she can have hip surgery with Dr. Ninfa Linden.  She has follow-up with him at the beginning of November to see if she is hit the weight call center that can schedule the surgery.  She denies having any other issues or concerns to discuss today.  The patient does not have symptoms concerning for  COVID-19 infection (fever, chills, cough, or new shortness of breath).   Past Medical, Surgical, Social History, Allergies, and Medications have been Reviewed.  Past Medical History:  Diagnosis Date  . Bronchitis   . Cigarette nicotine dependence without complication 1/74/0814  . Complex tear of medial meniscus of right knee 01/20/2017  . COPD (chronic obstructive pulmonary disease) ([place])   . Ganglion cyst of wrist 06/25/2015  . Migraines    Past Surgical History:  Procedure Laterality Date  . APPENDECTOMY    . BIOPSY  11/29/2019   Procedure: BIOPSY;  Surgeon: Harvel Quale, MD;  [facility]: [facility];  Service: Gastroenterology;;  ileocecal vlve  . CHOLECYSTECTOMY    . COLONOSCOPY WITH PROPOFOL N/A 11/29/2019   Procedure: COLONOSCOPY WITH PROPOFOL;  Surgeon: Harvel Quale, MD;  Location: [facility];  Service: Gastroenterology;  Laterality: N/A;  930  . KNEE ARTHROPLASTY     left knee  . POLYPECTOMY  11/29/2019   Procedure: POLYPECTOMY;  Surgeon: Harvel Quale, MD;  Location: [facility];  Service: Gastroenterology;;  sigmoid colon   . TUBAL LIGATION  1987     Current Meds  Medication Sig  . acetaminophen (TYLENOL) 500 MG tablet Take 1,000 mg by mouth every 6 (six) hours as needed for moderate pain or headache.  . albuterol (VENTOLIN HFA) 108 (90 Base) MCG/ACT inhaler Inhale 2 puffs into the lungs every 6 (six) hours as needed for wheezing or shortness of breath.  . budesonide-formoterol (SYMBICORT) 80-4.5 MCG/ACT inhaler Inhale 2  puffs into the lungs 2 (two) times daily.  . diphenhydrAMINE (BENADRYL) 25 MG tablet Take 25 mg by mouth daily as needed for allergies.  . Menthol, Topical Analgesic, (BENGAY EX) Apply 1 application topically daily as needed (leg pain).  . Multiple Vitamin (MULTIVITAMIN WITH MINERALS) TABS tablet Take 1 tablet by mouth daily.  . nicotine (NICODERM CQ - DOSED IN MG/24 HOURS) 14 mg/24hr patch Place 1 patch (14 mg  total) onto the skin daily.  . vitamin B-12 (CYANOCOBALAMIN) 500 MCG tablet Take 500 mcg by mouth daily.     Allergies:   Codeine   ROS:   Please see the history of present illness.    All other systems reviewed and are negative.   Labs/Other Tests and Data Reviewed:    Recent Labs: 11/02/2019: ALT 12; BUN 15; Creat 0.72; Hemoglobin 13.0; Platelets 232; Potassium 4.3; Sodium 143   Recent Lipid Panel Lab Results  Component Value Date/Time   CHOL 150 11/02/2019 07:49 AM   TRIG 70 11/02/2019 07:49 AM   HDL 50 11/02/2019 07:49 AM   CHOLHDL 3.0 11/02/2019 07:49 AM   LDLCALC 84 11/02/2019 07:49 AM    Wt Readings from Last 3 Encounters:  01/31/20 214 lb (97.1 kg)  01/24/20 214 lb (97.1 kg)  12/06/19 219 lb 3.2 oz (99.4 kg)     Objective:    Vital Signs:  BP (!) 128/99   Ht 5\' 1"  (1.549 m)   Wt 214 lb (97.1 kg)   LMP 04/14/2010   BMI 40.43 kg/m    VITAL SIGNS:  reviewed GEN:  no acute distress RESPIRATORY:  No shortness of breath in conversation PSYCH:  Normal affect and mood  ASSESSMENT & PLAN:    1. Essential hypertension  2. Chronic obstructive pulmonary disease, unspecified COPD type (HCC) - budesonide-formoterol (SYMBICORT) 80-4.5 MCG/ACT inhaler; Inhale 2 puffs into the lungs 2 (two) times daily.  Dispense: 10.2 g; Refill: 2 - albuterol (VENTOLIN HFA) 108 (90 Base) MCG/ACT inhaler; Inhale 2 puffs into the lungs every 6 (six) hours as needed for wheezing or shortness of breath.  Dispense: 8 g; Refill: 1  3. Unilateral primary osteoarthritis, right hip    Time:   Today, I have spent 5 minutes with the patient with telehealth technology discussing the above problems.     Medication Adjustments/Labs and Tests Ordered: Current medicines are reviewed at length with the patient today.  Concerns regarding medicines are outlined above.   Tests Ordered: No orders of the defined types were placed in this encounter.   Medication Changes: No orders of the defined  types were placed in this encounter.    Note: This dictation was prepared with Dragon dictation along with smaller phrase technology. Similar sounding words can be transcribed inadequately or may not be corrected upon review. Any transcriptional errors that result from this process are unintentional.      Disposition:  Follow up 5 months Signed, Perlie Mayo, NP  01/31/2020 3:07 PM     [facility]

## 2020-01-31 NOTE — Patient Instructions (Addendum)
  HAPPY FALL!  I appreciate the opportunity to provide you with care for your health and wellness. Today we discussed: possible hip surgery   Follow up: 4-5 months in office for BP check   No labs or referrals today  Glad you are doing well and have stopped smoking!!!!   Please continue to practice social distancing to keep you, your family, and our community safe.  If you must go out, please wear a mask and practice good handwashing.  It was a pleasure to see you and I look forward to continuing to work together on your health and well-being. Please do not hesitate to call the office if you need care or have questions about your care.  Have a wonderful day and week. With Gratitude, Cherly Beach, DNP, AGNP-BC

## 2020-01-31 NOTE — Assessment & Plan Note (Signed)
Is followed by Ortho- she might be having Sx if able to get BMI uder 40.

## 2020-01-31 NOTE — Assessment & Plan Note (Signed)
Controlled, doing well Continue Symbicort

## 2020-01-31 NOTE — Assessment & Plan Note (Signed)
She reports BP going up and down at times, usually she reports control as long as her pain is not bad.  DASH diet and exercise encouraged as tolerated. Might need something low dose to protect kidenys

## 2020-02-13 ENCOUNTER — Other Ambulatory Visit: Payer: Self-pay | Admitting: Orthopaedic Surgery

## 2020-02-13 ENCOUNTER — Telehealth: Payer: Self-pay | Admitting: Orthopaedic Surgery

## 2020-02-13 MED ORDER — TRAMADOL HCL 50 MG PO TABS: 50.0000 mg | ORAL_TABLET | Freq: Four times a day (QID) | ORAL | 0 refills | Status: DC | PRN

## 2020-02-13 NOTE — Telephone Encounter (Signed)
Patient called requesting pain medication. Please send to pharmacy [facility]. Patient phone number is [phone].

## 2020-02-21 ENCOUNTER — Ambulatory Visit (INDEPENDENT_AMBULATORY_CARE_PROVIDER_SITE_OTHER): Payer: 59 | Admitting: Orthopaedic Surgery

## 2020-02-21 ENCOUNTER — Encounter: Payer: Self-pay | Admitting: Orthopaedic Surgery

## 2020-02-21 VITALS — Ht 61.0 in | Wt 211.0 lb

## 2020-02-21 DIAGNOSIS — M1611 Unilateral primary osteoarthritis, right hip: Secondary | ICD-10-CM | POA: Diagnosis not present

## 2020-02-21 NOTE — Progress Notes (Signed)
HPI: [name] returns today for weight check. Again she has severe end-stage arthritis of her right hip. She is walking with a cane and has an antalgic gait. She notes that she feels her right hip is shorter than the left. She has been watching her diet into the day her BMI is 39.87. She notes that her right leg feels shorter than her left.  Physical exam:  Height 5 foot 1 weight  211 pounds BMI 39.87 General: Well-developed well-nourished female no acute distress. Ambulates with a cane with antalgic gait on the right.   Impression: End-stage arthritis right hip  Plan: We will schedule her for right total hip arthroplasty in the near future. She is to keep her weight down. She is reminded if her weight is above a BMI of 40 that we will have to cancel her surgery. Questions encouraged and answered about right total hip arthroplasty surgery. Risks discussed with patient include but not limited to wound healing problems, blood loss, DVT, prolonged pain and nerve injury. Also discussed with her the possibility of leg length discrepancy. We will have her follow-up with [place] 2 weeks postop.

## 2020-04-24 ENCOUNTER — Other Ambulatory Visit: Payer: Self-pay | Admitting: Physician Assistant

## 2020-04-24 NOTE — Patient Instructions (Signed)
DUE TO COVID-19 ONLY ONE VISITOR IS ALLOWED TO COME WITH YOU AND STAY IN THE WAITING ROOM ONLY DURING PRE OP AND PROCEDURE DAY OF SURGERY. THE 1 VISITOR  MAY VISIT WITH YOU AFTER SURGERY IN YOUR PRIVATE ROOM DURING VISITING HOURS ONLY!  YOU NEED TO HAVE A COVID 19 TEST ON__1/18_____ @_______ , THIS TEST MUST BE DONE BEFORE SURGERY,  COVID TESTING SITE [address] [place] [address], IT IS ON THE RIGHT GOING OUT [address] APPROXIMATELY  2 MINUTES PAST [employer] ON THE RIGHT. ONCE YOUR COVID TEST IS COMPLETED,  PLEASE BEGIN THE QUARANTINE INSTRUCTIONS AS OUTLINED IN YOUR HANDOUT.                Michele Romero    Your procedure is scheduled on: 05/04/20   Report to [facility] Main  Entrance   Report to admitting at 8:30 AM     Call this number if you have problems the morning of surgery [place], NO CHEWING GUM [place].   No food after midnight.    You may have clear liquid until 8:30 AM.    At 8:00 AM drink pre surgery drink.   Nothing by mouth after 8:30 AM.   Take these medicines the morning of surgery with A SIP OF WATER: Use inhalers and bring with you to the hospital                                 You may not have any metal on your body including hair pins and              piercings  Do not wear jewelry, make-up, lotions, powders or perfumes, deodorant             Do not wear nail polish on your fingernails.  Do not shave  48 hours prior to surgery.              Do not bring valuables to the hospital. [facility].  Contacts, dentures or bridgework may not be worn into surgery.      Patients discharged the day of surgery will not be allowed to drive home.   IF YOU ARE HAVING SURGERY AND GOING HOME THE SAME DAY, YOU MUST HAVE AN ADULT TO DRIVE YOU HOME AND BE WITH YOU FOR 24 HOURS. YOU MAY GO HOME BY TAXI OR  UBER OR ORTHERWISE, BUT AN ADULT MUST ACCOMPANY YOU HOME AND STAY WITH YOU FOR 24 HOURS.  Name and phone number of your driver:  Special Instructions: N/A              Please read over the following fact sheets you were given: _____________________________________________________________________             [facility] - Preparing for Surgery Before surgery, you can play an important role.   Because skin is not sterile, your skin needs to be as free of germs as possible.   You can reduce the number of germs on your skin by washing with CHG (chlorahexidine gluconate) soap before surgery.   CHG is an antiseptic cleaner which kills germs and bonds with the skin to continue killing germs even after washing. Please DO  NOT use if you have an allergy to CHG or antibacterial soaps.   If your skin becomes reddened/irritated stop using the CHG and inform your nurse when you arrive at Short Stay. Do not shave (including legs and underarms) for at least 48 hours prior to the first CHG shower.  Please follow these instructions carefully:  1.  Shower with CHG Soap the night before surgery and the  morning of Surgery.  2.  If you choose to wash your hair, wash your hair first as usual with your  normal  shampoo.  3.  After you shampoo, rinse your hair and body thoroughly to remove the  shampoo.                                        4.  Use CHG as you would any other liquid soap.  You can apply chg directly  to the skin and wash                       Gently with a scrungie or clean washcloth.  5.  Apply the CHG Soap to your body ONLY FROM THE NECK DOWN.   Do not use on face/ open                           Wound or open sores. Avoid contact with eyes, ears mouth and genitals (private parts).                       Wash face,  Genitals (private parts) with your normal soap.             6.  Wash thoroughly, paying special attention to the area where your surgery  will be performed.  7.  Thoroughly rinse  your body with warm water from the neck down.  8.  DO NOT shower/wash with your normal soap after using and rinsing off  the CHG Soap.             9.  Pat yourself dry with a clean towel.            10.  Wear clean pajamas.            11.  Place clean sheets on your bed the night of your first shower and do not  sleep with pets. Day of Surgery : Do not apply any lotions/deodorants the morning of surgery.  Please wear clean clothes to the hospital/surgery center.  FAILURE TO FOLLOW THESE INSTRUCTIONS MAY RESULT IN THE CANCELLATION OF YOUR SURGERY PATIENT SIGNATURE_________________________________  NURSE SIGNATURE__________________________________  ________________________________________________________________________   Michele Romero  An incentive spirometer is a tool that can help keep your lungs clear and active. This tool measures how well you are filling your lungs with each breath. Taking long deep breaths may help reverse or decrease the chance of developing breathing (pulmonary) problems (especially infection) following:  A long period of time when you are unable to move or be active. BEFORE THE PROCEDURE   If the spirometer includes an indicator to show your best effort, your nurse or respiratory therapist will set it to a desired goal.  If possible, sit up straight or lean slightly forward. Try not to slouch.  Hold the incentive spirometer in an upright position. INSTRUCTIONS FOR USE  1. Sit on the  edge of your bed if possible, or sit up as far as you can in bed or on a chair. 2. Hold the incentive spirometer in an upright position. 3. Breathe out normally. 4. Place the mouthpiece in your mouth and seal your lips tightly around it. 5. Breathe in slowly and as deeply as possible, raising the piston or the ball toward the top of the column. 6. Hold your breath for 3-5 seconds or for as long as possible. Allow the piston or ball to fall to the bottom of the  column. 7. Remove the mouthpiece from your mouth and breathe out normally. 8. Rest for a few seconds and repeat Steps 1 through 7 at least 10 times every 1-2 hours when you are awake. Take your time and take a few normal breaths between deep breaths. 9. The spirometer may include an indicator to show your best effort. Use the indicator as a goal to work toward during each repetition. 10. After each set of 10 deep breaths, practice coughing to be sure your lungs are clear. If you have an incision (the cut made at the time of surgery), support your incision when coughing by placing a pillow or rolled up towels firmly against it. Once you are able to get out of bed, walk around indoors and cough well. You may stop using the incentive spirometer when instructed by your caregiver.  RISKS AND COMPLICATIONS  Take your time so you do not get dizzy or light-headed.  If you are in pain, you may need to take or ask for pain medication before doing incentive spirometry. It is harder to take a deep breath if you are having pain. AFTER USE  Rest and breathe slowly and easily.  It can be helpful to keep track of a log of your progress. Your caregiver can provide you with a simple table to help with this. If you are using the spirometer at home, follow these instructions: [place] IF:   You are having difficultly using the spirometer.  You have trouble using the spirometer as often as instructed.  Your pain medication is not giving enough relief while using the spirometer.  You develop fever of 100.5 F (38.1 C) or higher. SEEK IMMEDIATE MEDICAL CARE IF:   You cough up bloody sputum that had not been present before.  You develop fever of 102 F (38.9 C) or greater.  You develop worsening pain at or near the incision site. MAKE SURE YOU:   Understand these instructions.  Will watch your condition.  Will get help right away if you are not doing well or get worse. Document Released:  08/11/2006 Document Revised: 06/23/2011 Document Reviewed: 10/12/2006 [facility] Patient Information 2014 [place], [place].   ________________________________________________________________________

## 2020-04-25 ENCOUNTER — Encounter (HOSPITAL_COMMUNITY): Admission: RE | Admit: 2020-04-25 | Payer: 59 | Source: Ambulatory Visit

## 2020-05-04 ENCOUNTER — Ambulatory Visit: Admit: 2020-05-04 | Payer: 59 | Admitting: Orthopaedic Surgery

## 2020-05-04 SURGERY — ARTHROPLASTY, HIP, TOTAL, ANTERIOR APPROACH
Anesthesia: Choice | Site: Hip | Laterality: Right

## 2020-06-05 ENCOUNTER — Ambulatory Visit: Payer: 59 | Admitting: Family Medicine

## 2020-08-14 ENCOUNTER — Other Ambulatory Visit: Payer: Self-pay

## 2020-08-14 ENCOUNTER — Encounter: Payer: Self-pay | Admitting: Nurse Practitioner

## 2020-08-14 ENCOUNTER — Ambulatory Visit (INDEPENDENT_AMBULATORY_CARE_PROVIDER_SITE_OTHER): Payer: 59 | Admitting: Nurse Practitioner

## 2020-08-14 VITALS — BP 130/80 | HR 96 | Temp 98.2°F | Resp 18 | Ht 62.0 in | Wt 214.0 lb

## 2020-08-14 DIAGNOSIS — Z139 Encounter for screening, unspecified: Secondary | ICD-10-CM

## 2020-08-14 DIAGNOSIS — J449 Chronic obstructive pulmonary disease, unspecified: Secondary | ICD-10-CM | POA: Diagnosis not present

## 2020-08-14 DIAGNOSIS — Z0000 Encounter for general adult medical examination without abnormal findings: Secondary | ICD-10-CM

## 2020-08-14 DIAGNOSIS — M1611 Unilateral primary osteoarthritis, right hip: Secondary | ICD-10-CM | POA: Diagnosis not present

## 2020-08-14 MED ORDER — ALBUTEROL SULFATE HFA 108 (90 BASE) MCG/ACT IN AERS: 2.0000 | INHALATION_SPRAY | Freq: Four times a day (QID) | RESPIRATORY_TRACT | 1 refills | Status: DC | PRN

## 2020-08-14 MED ORDER — BUDESONIDE-FORMOTEROL FUMARATE 80-4.5 MCG/ACT IN AERO: 2.0000 | INHALATION_SPRAY | Freq: Two times a day (BID) | RESPIRATORY_TRACT | 2 refills | Status: DC

## 2020-08-14 NOTE — Progress Notes (Signed)
Acute Office Visit  Subjective:    Patient ID: Michele Romero, female    DOB: Apr 17, 1960, 60 y.o.   MRN: [id number]  Chief Complaint  Patient presents with  . Hypertension    HPI Patient is in today for lab follow-up. She has not had any labs drawn.  At her last OV, she was started on albuterol and symbicort for COPD.  She is having right hip pain. Was seeing Dr. Ninfa Linden in [place]. She had insurance issues, so her surgery was cancelled. She is still having pain.  Past Medical History:  Diagnosis Date  . Bronchitis   . Cigarette nicotine dependence without complication 4/62/8638  . Complex tear of medial meniscus of right knee 01/20/2017  . COPD (chronic obstructive pulmonary disease) ([place])   . Ganglion cyst of wrist 06/25/2015  . Migraines     Past Surgical History:  Procedure Laterality Date  . APPENDECTOMY    . BIOPSY  11/29/2019   Procedure: BIOPSY;  Surgeon: Harvel Quale, MD;  [facility];  Service: Gastroenterology;;  ileocecal vlve  . CHOLECYSTECTOMY    . COLONOSCOPY WITH PROPOFOL N/A 11/29/2019   Procedure: COLONOSCOPY WITH PROPOFOL;  Surgeon: Harvel Quale, MD;  [facility];  Service: Gastroenterology;  Laterality: N/A;  930  . KNEE ARTHROPLASTY     left knee  . POLYPECTOMY  11/29/2019   Procedure: POLYPECTOMY;  Surgeon: Harvel Quale, MD;  [facility];  Service: Gastroenterology;;  sigmoid colon   . TUBAL LIGATION  1987    Family History  Problem Relation Age of Onset  . Diabetes Mother   . Hypertension Mother   . Heart disease Mother        CHF  . Stroke Mother   . Heart disease Father     Social History   Socioeconomic History  . Marital status: Single    Spouse name: Not on file  . Number of children: 2  . Years of education: Not on file  . Highest education level: High school graduate  Occupational History  . Not on file  Tobacco Use  . Smoking status: Former Smoker     Packs/day: 0.50    Years: 30.00    Pack years: 15.00    Types: Cigarettes    Quit date: 01/01/2020    Years since quitting: 0.6  . Smokeless tobacco: Never Used  Vaping Use  . Vaping Use: Never used  Substance and Sexual Activity  . Alcohol use: No  . Drug use: Yes    Frequency: 6.0 times per week    Types: Marijuana  . Sexual activity: Not on file  Other Topics Concern  . Not on file  Social History Narrative   Lives with daughter and 1 Eldridge Abrahams   Son is in [place]      Enjoys: sitting outside, music, tv      Diet: eats all food groups   Caffeine: soda and tea: 2-3 cups daily   Water: 2-3 16 oz bottles      Wears seat belt    Does not use phone while driving   Oceanographer at home    No weapons    Social Determinants of Health   Financial Resource Strain: Low Risk   . Difficulty of Paying Living Expenses: Not hard at all  Food Insecurity: No Food Insecurity  . Worried About Charity fundraiser in the Last Year: Never true  . Ran Out of Food  in the Last Year: Never true  Transportation Needs: No Transportation Needs  . Lack of Transportation (Medical): No  . Lack of Transportation (Non-Medical): No  Physical Activity: Inactive  . Days of Exercise per Week: 0 days  . Minutes of Exercise per Session: 0 min  Stress: No Stress Concern Present  . Feeling of Stress : Not at all  Social Connections: Moderately Isolated  . Frequency of Communication with Friends and Family: More than three times a week  . Frequency of Social Gatherings with Friends and Family: More than three times a week  . Attends Religious Services: 1 to 4 times per year  . Active Member of Clubs or Organizations: No  . Attends Archivist Meetings: Never  . Marital Status: Never married  Intimate Partner Violence: Not At Risk  . Fear of Current or Ex-Partner: No  . Emotionally Abused: No  . Physically Abused: No  . Sexually Abused: No    Outpatient Medications Prior to Visit   Medication Sig Dispense Refill  . acetaminophen (TYLENOL) 500 MG tablet Take 1,000 mg by mouth every 6 (six) hours as needed for moderate pain or headache.    . diphenhydrAMINE (BENADRYL) 25 MG tablet Take 25 mg by mouth daily as needed for allergies.    . Menthol, Topical Analgesic, (BENGAY EX) Apply 1 application topically daily as needed (leg pain).    . Multiple Vitamin (MULTIVITAMIN WITH MINERALS) TABS tablet Take 1 tablet by mouth daily.    . traMADol (ULTRAM) 50 MG tablet Take 1-2 tablets (50-100 mg total) by mouth every 6 (six) hours as needed. 30 tablet 0  . vitamin B-12 (CYANOCOBALAMIN) 500 MCG tablet Take 500 mcg by mouth daily.    Marland Kitchen albuterol (VENTOLIN HFA) 108 (90 Base) MCG/ACT inhaler Inhale 2 puffs into the lungs every 6 (six) hours as needed for wheezing or shortness of breath. 8 g 1  . budesonide-formoterol (SYMBICORT) 80-4.5 MCG/ACT inhaler Inhale 2 puffs into the lungs 2 (two) times daily. 10.2 g 2   No facility-administered medications prior to visit.    Allergies  Allergen Reactions  . Codeine Nausea And Vomiting and Rash    Review of Systems  Constitutional: Negative.   Respiratory: Negative.   Cardiovascular: Negative.   Musculoskeletal: Positive for arthralgias.       Right hip pain  Psychiatric/Behavioral: Negative.        Objective:    Physical Exam Constitutional:      Appearance: Normal appearance. She is obese.  Cardiovascular:     Rate and Rhythm: Normal rate and regular rhythm.     Pulses: Normal pulses.     Heart sounds: Normal heart sounds.  Pulmonary:     Effort: Pulmonary effort is normal.     Breath sounds: Normal breath sounds.  Musculoskeletal:        General: Tenderness present.     Comments: Pain in right hip with ROM exercises  Neurological:     Mental Status: She is alert.  Psychiatric:        Mood and Affect: Mood normal.        Behavior: Behavior normal.        Thought Content: Thought content normal.        Judgment:  Judgment normal.     BP 130/80   Pulse 96   Temp 98.2 F (36.8 C)   Resp 18   Ht _0  (1.575 m)   Wt 214 lb (97.1 kg)   LMP  04/14/2010   SpO2 95%   BMI 39.14 kg/m  Wt Readings from Last 3 Encounters:  08/14/20 214 lb (97.1 kg)  02/21/20 211 lb (95.7 kg)  01/31/20 214 lb (97.1 kg)    Health Maintenance Due  Topic Date Due  . Hepatitis C Screening  Never done  . HIV Screening  Never done  . TETANUS/TDAP  Never done    There are no preventive care reminders to display for this patient.   Lab Results  Component Value Date   TSH 1.66 06/25/2015   Lab Results  Component Value Date   WBC 7.3 11/02/2019   HGB 13.0 11/02/2019   HCT 39.4 11/02/2019   MCV 92.1 11/02/2019   PLT 232 11/02/2019   Lab Results  Component Value Date   NA 143 11/02/2019   K 4.3 11/02/2019   CO2 29 11/02/2019   GLUCOSE 92 11/02/2019   BUN 15 11/02/2019   CREATININE 0.72 11/02/2019   BILITOT 0.3 11/02/2019   ALKPHOS 69 04/03/2017   AST 13 11/02/2019   ALT 12 11/02/2019   PROT 7.3 11/02/2019   ALBUMIN 3.8 04/03/2017   CALCIUM 8.9 11/02/2019   ANIONGAP 5 11/03/2017   Lab Results  Component Value Date   CHOL 150 11/02/2019   Lab Results  Component Value Date   HDL 50 11/02/2019   Lab Results  Component Value Date   LDLCALC 84 11/02/2019   Lab Results  Component Value Date   TRIG 70 11/02/2019   Lab Results  Component Value Date   CHOLHDL 3.0 11/02/2019   Lab Results  Component Value Date   HGBA1C 5.6 11/02/2019       Assessment & Plan:   Problem List Items Addressed This Visit      Respiratory   Chronic obstructive pulmonary disease ([place])    -refilled COPD meds      Relevant Medications   albuterol (VENTOLIN HFA) 108 (90 Base) MCG/ACT inhaler   budesonide-formoterol (SYMBICORT) 80-4.5 MCG/ACT inhaler     Musculoskeletal and Integument   Unilateral primary osteoarthritis, right hip    -was referred to ortho previously; discussed that she should f/u with  ortho for hip pain      Relevant Orders   Ambulatory referral to Orthopedic Surgery    Other Visit Diagnoses    Screening due    -  Primary   Relevant Orders   HCV Ab w/Rflx to Verification   HIV Antibody (routine testing w rflx)   Routine medical exam       Relevant Orders   CBC with Differential/Platelet   CMP14+EGFR   Lipid Panel With LDL/HDL Ratio       Meds ordered this encounter  Medications  . albuterol (VENTOLIN HFA) 108 (90 Base) MCG/ACT inhaler    Sig: Inhale 2 puffs into the lungs every 6 (six) hours as needed for wheezing or shortness of breath.    Dispense:  8 g    Refill:  1  . budesonide-formoterol (SYMBICORT) 80-4.5 MCG/ACT inhaler    Sig: Inhale 2 puffs into the lungs 2 (two) times daily.    Dispense:  10.2 g    Refill:  2     [name], NP

## 2020-08-14 NOTE — Patient Instructions (Signed)
Please have fasting labs drawn 2-3 days prior to your appointment so we can discuss the results during your office visit.  

## 2020-08-14 NOTE — Assessment & Plan Note (Signed)
-  was referred to ortho previously; discussed that she should f/u with ortho for hip pain

## 2020-08-14 NOTE — Assessment & Plan Note (Signed)
-  refilled COPD meds

## 2020-08-20 ENCOUNTER — Ambulatory Visit (INDEPENDENT_AMBULATORY_CARE_PROVIDER_SITE_OTHER): Payer: 59 | Admitting: Orthopaedic Surgery

## 2020-08-20 VITALS — Ht 62.0 in | Wt 214.0 lb

## 2020-08-20 DIAGNOSIS — M1611 Unilateral primary osteoarthritis, right hip: Secondary | ICD-10-CM

## 2020-08-20 MED ORDER — ACETAMINOPHEN-CODEINE #3 300-30 MG PO TABS: 1.0000 | ORAL_TABLET | Freq: Three times a day (TID) | ORAL | 0 refills | Status: DC | PRN

## 2020-08-20 NOTE — Progress Notes (Signed)
The patient is very well-known to me.  She is 60 years old and has debilitating end-stage arthritis with the right hip.  It is 10 out of 10 pain that it is causing her and it is detriment affecting her mobility, her quality of life and her actives daily living.  We have needed her to lose weight and get her BMI below 40 to be able to successfully proceed with surgery.  She does ambulate using a cane as well to offload that hip.  Her BMI today is 39.14.  On exam her left hip moves well but her right hip has very limited rotation and has severe pain with attempts of rotation.  Her x-rays from the last visit show severe end-stage arthritis of the right hip with complete loss of the joint space and particular osteophytes as well as sclerotic and cystic changes in the femoral head and acetabulum.  She understands this will still be a difficult surgery given her obesity but we can proceed at this point given her weight loss.  She knows that we are usually booked out about 3 to 4 weeks if she needs to keep the weight off of her frame while we schedule the surgery.  I will send in some Tylenol 3 for pain to take sparingly and as needed.  Of note she denies any headache, chest pain, shortness of breath, fever, chills, nausea, vomiting.  She has seen her primary care physician recently as well.  We had a long and thorough discussion about the surgery and describing the risk and benefits and what is done through the interoperative and postoperative course.  All question concerns were answered and addressed.  We will work on getting this scheduled for a right total hip arthroplasty.

## 2020-09-13 ENCOUNTER — Other Ambulatory Visit (HOSPITAL_COMMUNITY)
Admission: RE | Admit: 2020-09-13 | Discharge: 2020-09-13 | Disposition: A | Discharge status: Home / Self Care | Payer: 59 | Source: Ambulatory Visit | Attending: Obstetrics & Gynecology | Admitting: Obstetrics & Gynecology

## 2020-09-13 ENCOUNTER — Encounter: Payer: Self-pay | Admitting: Obstetrics & Gynecology

## 2020-09-13 ENCOUNTER — Other Ambulatory Visit: Payer: Self-pay

## 2020-09-13 ENCOUNTER — Ambulatory Visit (INDEPENDENT_AMBULATORY_CARE_PROVIDER_SITE_OTHER): Payer: 59 | Admitting: Obstetrics & Gynecology

## 2020-09-13 VITALS — BP 135/82 | HR 98 | Ht 62.0 in | Wt 209.0 lb

## 2020-09-13 DIAGNOSIS — N950 Postmenopausal bleeding: Secondary | ICD-10-CM | POA: Insufficient documentation

## 2020-09-13 DIAGNOSIS — Z124 Encounter for screening for malignant neoplasm of cervix: Secondary | ICD-10-CM | POA: Insufficient documentation

## 2020-09-13 NOTE — Addendum Note (Signed)
Addended by: Linton Rump on: 09/13/2020 02:56 PM   Modules accepted: Orders

## 2020-09-13 NOTE — Progress Notes (Signed)
Endometrial Biopsy Procedure Note  Pre-operative Diagnosis: Post menopausal bleeding for the past 7 days (no bleeding for 10 years)  Not on a blood thinner  Post-operative Diagnosis: same  Indications: postmenopausal bleeding  Procedure Details   Urine pregnancy test was not done.  The risks (including infection, bleeding, pain, and uterine perforation) and benefits of the procedure were explained to the patient and Written informed consent was obtained.  Antibiotic prophylaxis against endocarditis was not indicated.   The patient was placed in the dorsal lithotomy position.  Bimanual exam showed the uterus to be in the neutral position.  A Graves' speculum inserted in the vagina, and the cervix prepped with povidone iodine.  Endocervical curettage with a Kevorkian curette was not performed.   A sharp tenaculum was applied to the anterior lip of the cervix for stabilization.  A sterile uterine sound was used to sound the uterus to a depth of 6.5 cm.  A Pipelle endometrial aspirator was used to sample the endometrium.  Sample was sent for pathologic examination.  Condition: Stable  Complications: None  Plan:  The patient was advised to call for any fever or for prolonged or severe pain or bleeding. She was advised to use OTC analgesics as needed for mild to moderate pain. She was advised to avoid vaginal intercourse for 48 hours or until the bleeding has completely stopped.  Attending Physician Documentation: I was present for or performed the following: endometrial biopsy   We will see her back for gyn sonogram to evaluate endometrial stripe and anatomy   Orders Placed This Encounter  Procedures  . US PELVIS (TRANSABDOMINAL ONLY)  . US PELVIS TRANSVAGINAL NON-OB (TV ONLY)

## 2020-09-14 ENCOUNTER — Other Ambulatory Visit: Payer: Self-pay | Admitting: *Deleted

## 2020-09-14 DIAGNOSIS — N950 Postmenopausal bleeding: Secondary | ICD-10-CM

## 2020-09-17 LAB — SURGICAL PATHOLOGY

## 2020-09-18 ENCOUNTER — Other Ambulatory Visit: Payer: Self-pay

## 2020-09-18 ENCOUNTER — Ambulatory Visit (HOSPITAL_COMMUNITY)
Admission: RE | Admit: 2020-09-18 | Discharge: 2020-09-18 | Disposition: A | Discharge status: Home / Self Care | Payer: 59 | Source: Ambulatory Visit | Attending: Obstetrics & Gynecology | Admitting: Obstetrics & Gynecology

## 2020-09-18 ENCOUNTER — Telehealth: Payer: Self-pay

## 2020-09-18 DIAGNOSIS — N950 Postmenopausal bleeding: Secondary | ICD-10-CM | POA: Diagnosis present

## 2020-09-18 LAB — CYTOLOGY - PAP
Chlamydia: NEGATIVE
Comment: NEGATIVE
Comment: NEGATIVE
Comment: NORMAL
Diagnosis: NEGATIVE
High risk HPV: NEGATIVE
Neisseria Gonorrhea: NEGATIVE

## 2020-09-24 ENCOUNTER — Other Ambulatory Visit: Payer: Self-pay

## 2020-09-25 ENCOUNTER — Encounter: Payer: 59 | Admitting: Nurse Practitioner

## 2020-09-25 ENCOUNTER — Ambulatory Visit: Payer: 59 | Admitting: Obstetrics & Gynecology

## 2020-09-25 ENCOUNTER — Encounter: Payer: Self-pay | Admitting: Obstetrics & Gynecology

## 2020-09-25 ENCOUNTER — Other Ambulatory Visit: Payer: Self-pay

## 2020-09-25 VITALS — BP 122/75 | HR 75 | Ht 62.0 in | Wt 212.0 lb

## 2020-09-25 DIAGNOSIS — R9389 Abnormal findings on diagnostic imaging of other specified body structures: Secondary | ICD-10-CM

## 2020-09-25 DIAGNOSIS — N950 Postmenopausal bleeding: Secondary | ICD-10-CM | POA: Diagnosis not present

## 2020-09-25 NOTE — Telephone Encounter (Signed)
Msg sent to Dr. Elonda Husky

## 2020-09-25 NOTE — Progress Notes (Signed)
Follow up appointment for results  Chief Complaint  Patient presents with   Follow-up    U/S from [facility]    Blood pressure 122/75, pulse 75, height 5\' 2"  (1.575 m), weight 212 lb (96.2 kg), last menstrual period 04/14/2010.  US PELVIC COMPLETE WITH TRANSVAGINAL  Result Date: 09/18/2020 CLINICAL DATA:  Postmenopausal bleeding for 2 weeks, past history of endometrial biopsy and tubal ligation EXAM: TRANSABDOMINAL AND TRANSVAGINAL ULTRASOUND OF PELVIS TECHNIQUE: Both transabdominal and transvaginal ultrasound examinations of the pelvis were performed. Transabdominal technique was performed for global imaging of the pelvis including uterus, ovaries, adnexal regions, and pelvic cul-de-sac. It was necessary to proceed with endovaginal exam following the transabdominal exam to visualize the endometrium and LEFT ovary. COMPARISON:  None FINDINGS: Uterus Measurements: 11.3 x 8.4 x 7.5 cm = volume: 371 mL. Anteverted. Large mass/leiomyoma at anterior RIGHT uterus 6.2 x 4.6 x 5.4 cm. Additional exophytic leiomyoma at LEFT uterus 3.0 x 4.1 x 2.8 cm. Endometrium Thickness: 19 mm. Thickened, heterogeneous, abnormal appearance. Cannot exclude component of fluid within the endometrial canal. Right ovary Not visualized, likely obscured by bowel Left ovary Not visualized, likely obscured by bowel Other findings Trace free pelvic fluid.  No adnexal masses. IMPRESSION: Two uterine leiomyomata, larger measuring 6.2 cm at anterior fundus to RIGHT. Thickened abnormal heterogeneous endometrial complex as above; in the setting of post-menopausal bleeding, endometrial sampling is indicated to exclude carcinoma. If results are benign, sonohysterogram should be considered for focal lesion work-up. (Ref: Radiological Reasoning: Algorithmic Workup of Abnormal Vaginal Bleeding with Endovaginal Sonography and Sonohysterography. AJR 2008; 948:N46-27) Nonvisualization of ovaries. These results will be called to the ordering clinician  or representative by the Radiologist Assistant, and communication documented in the PACS or [employer]. Electronically Signed   By: Lavonia Dana M.D.   On: 09/18/2020 15:41     Endometrial biopsy negative but no endometrial tissue is identified  MEDS ordered this encounter: No orders of the defined types were placed in this encounter.   Orders for this encounter: No orders of the defined types were placed in this encounter.   Impression:   ICD-10-CM   1. Postmenopausal bleeding  N95.0     2. Thickened endometrium, 19 mm on sonogram  R93.89        Plan: Based on endometrial findings on sonogram 19 mm stripe will need a formal hysteroscopy uterine curettage try to get 6/29 or 10/17/20  Follow Up: Return if symptoms worsen or fail to improve.     All questions were answered.  Past Medical History:  Diagnosis Date   Bronchitis    Cigarette nicotine dependence without complication 0/35/0093   Complex tear of medial meniscus of right knee 01/20/2017   COPD (chronic obstructive pulmonary disease) ([place])    Ganglion cyst of wrist 06/25/2015   Migraines     Past Surgical History:  Procedure Laterality Date   APPENDECTOMY     BIOPSY  11/29/2019   Procedure: BIOPSY;  Surgeon: Harvel Quale, MD;  [facility];  Service: Gastroenterology;;  ileocecal vlve   CHOLECYSTECTOMY     COLONOSCOPY WITH PROPOFOL N/A 11/29/2019   Procedure: COLONOSCOPY WITH PROPOFOL;  Surgeon: Harvel Quale, MD;  [facility];  Service: Gastroenterology;  Laterality: N/A;  930   KNEE ARTHROPLASTY     left knee   POLYPECTOMY  11/29/2019   Procedure: POLYPECTOMY;  Surgeon: Harvel Quale, MD;  [facility];  Service: Gastroenterology;;  sigmoid colon  TUBAL LIGATION  1987    OB History     Gravida  3   Para  2   Term  2   Preterm      AB  1   Living  2      SAB  1   IAB      Ectopic      Multiple      Live Births               Allergies  Allergen Reactions   Codeine Nausea And Vomiting and Rash    Social History   Socioeconomic History   Marital status: Single    Spouse name: Not on file   Number of children: 2   Years of education: Not on file   Highest education level: High school graduate  Occupational History   Not on file  Tobacco Use   Smoking status: Former    Packs/day: 0.50    Years: 30.00    Pack years: 15.00    Types: Cigarettes    Quit date: 01/01/2020    Years since quitting: 0.7   Smokeless tobacco: Never  Vaping Use   Vaping Use: Never used  Substance and Sexual Activity   Alcohol use: No   Drug use: Yes    Frequency: 6.0 times per week    Types: Marijuana    Comment: every now and then   Sexual activity: Not Currently    Birth control/protection: Surgical    Comment: tubal  Other Topics Concern   Not on file  Social History Narrative   Lives with daughter and 1 grandbaby   Son is in [place]      Enjoys: sitting outside, music, tv      Diet: eats all food groups   Caffeine: soda and tea: 2-3 cups daily   Water: 2-3 16 oz bottles      Wears seat belt    Does not use phone while driving   Oceanographer at home    No weapons    Social Determinants of Health   Financial Resource Strain: Low Risk    Difficulty of Paying Living Expenses: Not hard at all  Food Insecurity: No Food Insecurity   Worried About Charity fundraiser in the Last Year: Never true   Arboriculturist in the Last Year: Never true  Transportation Needs: No Transportation Needs   Lack of Transportation (Medical): No   Lack of Transportation (Non-Medical): No  Physical Activity: Inactive   Days of Exercise per Week: 0 days   Minutes of Exercise per Session: 0 min  Stress: No Stress Concern Present   Feeling of Stress : Not at all  Social Connections: Moderately Isolated   Frequency of Communication with Friends and Family: More than three times a week   Frequency of Social  Gatherings with Friends and Family: More than three times a week   Attends Religious Services: 1 to 4 times per year   Active Member of [employer] or Organizations: No   Attends Archivist Meetings: Never   Marital Status: Never married    Family History  Problem Relation Age of Onset   Diabetes Mother    Hypertension Mother    Heart disease Mother        CHF   Stroke Mother    Heart disease Father

## 2020-10-01 NOTE — Patient Instructions (Signed)
Michele Romero  10/01/2020     @PREFPERIOPPHARMACY @   Your procedure is scheduled on  10/10/2020.   Report to [facility] at  1130  A.M.  Call this number if you have problems the morning of surgery:  [phone]   Remember:  Do not eat after midnight.   You may drink clear liquids until 0730  .          At 0730 drink your carb drink. After this, nothing else to drink.   Clear liquids allowed are:                    Water, Juice (non-citric and without pulp - diabetics please choose diet or no sugar options), Carbonated beverages - (diabetics please choose diet or no sugar options), Clear Tea, Black Coffee only (no creamer, milk or cream including half and half), Plain Jell-O only (diabetics please choose diet or no sugar options), Gatorade (diabetics please choose diet or no sugar options), and Plain Popsicles only     Take these medicines the morning of surgery with A SIP OF WATER     Benadryl (if needed). Use your inhalers before you come and bring your rescue inhaler with you.     Please brush your teeth.  Do not wear jewelry, make-up or nail polish.  Do not wear lotions, powders, or perfumes, or deodorant.  Do not shave 48 hours prior to surgery.  Men may shave face and neck.  Do not bring valuables to the hospital.  [facility] is not responsible for any belongings or valuables.  Contacts, dentures or bridgework may not be worn into surgery.  Leave your suitcase in the car.  After surgery it may be brought to your room.  For patients admitted to the hospital, discharge time will be determined by your treatment team.  Patients discharged the day of surgery will not be allowed to drive home and must have someone with them for 24 hours.    Special instructions:     DO NOT smoke tobacco or vape for 24 hours before your procedure.  Please read over the following fact sheets that you were given. Coughing and Deep Breathing, Surgical Site Infection  Prevention, Anesthesia Post-op Instructions, and Care and Recovery After Surgery      Dilation and Curettage or Vacuum Curettage, Care After The following information offers guidance on how to care for yourself after your procedure. Your doctor may also give you more specific instructions. Ifyou have problems or questions, contact your doctor. What can I expect after the procedure? After the procedure, it is common to have: Mild pain or cramps. Some bleeding or spotting from the vagina. These may last for up to 2 weeks. Follow these instructions at home: Medicines Take over-the-counter and prescription medicines only as told by your doctor. If told, take steps to prevent problems with pooping (constipation). You may need to: Drink enough fluid to keep your pee (urine) pale yellow. Take medicines. You will be told what medicines to take. Eat foods that are high in fiber. These include beans, whole grains, and fresh fruits and vegetables. Limit foods that are high in fat and sugar. These include fried or sweet foods. Ask your doctor if you should avoid driving or using machines while you are taking your medicine. Activity  If you were given a medicine to help you relax (sedative) during your procedure, it can affect you for many hours. Do  not drive or use machinery until your doctor says that it is safe. Rest as told by your doctor. Get up to take short walks every 1-2 hours. Ask for help if you feel weak or unsteady. Do not lift anything that is heavier than 10 lb (4.5 kg), or the limit that you are told. Return to your normal activities when your doctor says that it is safe.  Lifestyle For at least 2 weeks, or as long as told by your doctor: Do not douche. Do not use tampons. Do not have sex. General instructions Do not take baths, swim, or use a hot tub. Ask your doctor if you may take showers. Do not smoke or use any products that contain nicotine or tobacco. These can delay  healing. If you need help quitting, ask your doctor. Wear compression stockings as told by your doctor. It is up to you to get the results of your procedure. Ask how to get your results when they are ready. Keep all follow-up visits. Contact a doctor if: You have very bad cramps that get worse or do not get better with medicine. You have very bad pain in your belly (abdomen). You cannot drink fluids without vomiting. You have pain in the area just above your thighs. You have fluid from your vagina that smells bad. You have a rash. Get help right away if: You are bleeding a lot from your vagina. This means soaking more than one sanitary pad in 1 hour, and this happens for 2 hours in a row. You have a fever that is above 100.21F (38C). Your belly feels very tender or hard. You have chest pain. You have trouble breathing. You feel dizzy or light-headed. You faint. You have pain in your neck or shoulder area. These symptoms may be an emergency. Get help right away. Call your local emergency services (911 in the [place] Do not wait to see if the symptoms will go away. Do not drive yourself to the hospital. Summary After your procedure, it is common to have pain or cramping. It is also common to have bleeding or spotting from your vagina. Rest as told. Get up to take short walks every 1-2 hours. Do not lift anything that is heavier than 10 lb (4.5 kg), or the limit that you are told. Get help right away if you have problems from the procedure. Ask your doctor what problems to watch for. This information is not intended to replace advice given to you by your health care provider. Make sure you discuss any questions you have with your healthcare provider. Document Revised: 03/21/2020 Document Reviewed: 03/21/2020 Elsevier Patient Education  2022 [place] Anesthesia, Adult, Care After This sheet gives you information about how to care for yourself after your procedure. Your  health care provider may also give you more specific instructions. If you have problems or questions, contact your health careprovider. What can I expect after the procedure? After the procedure, the following side effects are common: Pain or discomfort at the IV site. Nausea. Vomiting. Sore throat. Trouble concentrating. Feeling cold or chills. Feeling weak or tired. Sleepiness and fatigue. Soreness and body aches. These side effects can affect parts of the body that were not involved in surgery. Follow these instructions at home: For the time period you were told by your health care provider:  Rest. Do not participate in activities where you could fall or become injured. Do not drive or use machinery. Do not drink alcohol. Do not take  sleeping pills or medicines that cause drowsiness. Do not make important decisions or sign legal documents. Do not take care of children on your own.  Eating and drinking Follow any instructions from your health care provider about eating or drinking restrictions. When you feel hungry, start by eating small amounts of foods that are soft and easy to digest (bland), such as toast. Gradually return to your regular diet. Drink enough fluid to keep your urine pale yellow. If you vomit, rehydrate by drinking water, juice, or clear broth. General instructions If you have sleep apnea, surgery and certain medicines can increase your risk for breathing problems. Follow instructions from your health care provider about wearing your sleep device: Anytime you are sleeping, including during daytime naps. While taking prescription pain medicines, sleeping medicines, or medicines that make you drowsy. Have a responsible adult stay with you for the time you are told. It is important to have someone help care for you until you are awake and alert. Return to your normal activities as told by your health care provider. Ask your health care provider what activities are  safe for you. Take over-the-counter and prescription medicines only as told by your health care provider. If you smoke, do not smoke without supervision. Keep all follow-up visits as told by your health care provider. This is important. Contact a health care provider if: You have nausea or vomiting that does not get better with medicine. You cannot eat or drink without vomiting. You have pain that does not get better with medicine. You are unable to pass urine. You develop a skin rash. You have a fever. You have redness around your IV site that gets worse. Get help right away if: You have difficulty breathing. You have chest pain. You have blood in your urine or stool, or you vomit blood. Summary After the procedure, it is common to have a sore throat or nausea. It is also common to feel tired. Have a responsible adult stay with you for the time you are told. It is important to have someone help care for you until you are awake and alert. When you feel hungry, start by eating small amounts of foods that are soft and easy to digest (bland), such as toast. Gradually return to your regular diet. Drink enough fluid to keep your urine pale yellow. Return to your normal activities as told by your health care provider. Ask your health care provider what activities are safe for you. This information is not intended to replace advice given to you by your health care provider. Make sure you discuss any questions you have with your healthcare provider. Document Revised: 12/15/2019 Document Reviewed: 07/14/2019 Elsevier Patient Education  2022 [place]. How to Use Chlorhexidine for Bathing Chlorhexidine gluconate (CHG) is a germ-killing (antiseptic) solution that is used to clean the skin. It can get rid of the bacteria that normally live on the skin and can keep them away for about 24 hours. To clean your skin with CHG, you may be given: A CHG solution to use in the shower or as part of a sponge  bath. A prepackaged cloth that contains CHG. Cleaning your skin with CHG may help lower the risk for infection: While you are staying in the intensive care unit of the hospital. If you have a vascular access, such as a central line, to provide short-term or long-term access to your veins. If you have a catheter to drain urine from your bladder. If you are on a  ventilator. A ventilator is a machine that helps you breathe by moving air in and out of your lungs. After surgery. What are the risks? Risks of using CHG include: A skin reaction. Hearing loss, if CHG gets in your ears. Eye injury, if CHG gets in your eyes and is not rinsed out. The CHG product catching fire. Make sure that you avoid smoking and flames after applying CHG to your skin. Do not use CHG: If you have a chlorhexidine allergy or have previously reacted to chlorhexidine. On babies younger than 60 months of age. How to use CHG solution Use CHG only as told by your health care provider, and follow the instructions on the label. Use the full amount of CHG as directed. Usually, this is one bottle. During a shower Follow these steps when using CHG solution during a shower (unless your health care provider gives you different instructions): Start the shower. Use your normal soap and shampoo to wash your face and hair. Turn off the shower or move out of the shower stream. Pour the CHG onto a clean washcloth. Do not use any type of brush or rough-edged sponge. Starting at your neck, lather your body down to your toes. Make sure you follow these instructions: If you will be having surgery, pay special attention to the part of your body where you will be having surgery. Scrub this area for at least 1 minute. Do not use CHG on your head or face. If the solution gets into your ears or eyes, rinse them well with water. Avoid your genital area. Avoid any areas of skin that have broken skin, cuts, or scrapes. Scrub your back and under  your arms. Make sure to wash skin folds. Let the lather sit on your skin for 1-2 minutes or as long as told by your health care provider. Thoroughly rinse your entire body in the shower. Make sure that all body creases and crevices are rinsed well. Dry off with a clean towel. Do not put any substances on your body afterward--such as powder, lotion, or perfume--unless you are told to do so by your health care provider. Only use lotions that are recommended by the manufacturer. Put on clean clothes or pajamas. If it is the night before your surgery, sleep in clean sheets.  During a sponge bath Follow these steps when using CHG solution during a sponge bath (unless your health care provider gives you different instructions): Use your normal soap and shampoo to wash your face and hair. Pour the CHG onto a clean washcloth. Starting at your neck, lather your body down to your toes. Make sure you follow these instructions: If you will be having surgery, pay special attention to the part of your body where you will be having surgery. Scrub this area for at least 1 minute. Do not use CHG on your head or face. If the solution gets into your ears or eyes, rinse them well with water. Avoid your genital area. Avoid any areas of skin that have broken skin, cuts, or scrapes. Scrub your back and under your arms. Make sure to wash skin folds. Let the lather sit on your skin for 1-2 minutes or as long as told by your health care provider. Using a different clean, wet washcloth, thoroughly rinse your entire body. Make sure that all body creases and crevices are rinsed well. Dry off with a clean towel. Do not put any substances on your body afterward--such as powder, lotion, or perfume--unless you are told  to do so by your health care provider. Only use lotions that are recommended by the manufacturer. Put on clean clothes or pajamas. If it is the night before your surgery, sleep in clean sheets. How to use CHG  prepackaged cloths Only use CHG cloths as told by your health care provider, and follow the instructions on the label. Use the CHG cloth on clean, dry skin. Do not use the CHG cloth on your head or face unless your health care provider tells you to. When washing with the CHG cloth: Avoid your genital area. Avoid any areas of skin that have broken skin, cuts, or scrapes. Before surgery Follow these steps when using a CHG cloth to clean before surgery (unless your health care provider gives you different instructions): Using the CHG cloth, vigorously scrub the part of your body where you will be having surgery. Scrub using a back-and-forth motion for 3 minutes. The area on your body should be completely wet with CHG when you are done scrubbing. Do not rinse. Discard the cloth and let the area air-dry. Do not put any substances on the area afterward, such as powder, lotion, or perfume. Put on clean clothes or pajamas. If it is the night before your surgery, sleep in clean sheets.  For general bathing Follow these steps when using CHG cloths for general bathing (unless your health care provider gives you different instructions). Use a separate CHG cloth for each area of your body. Make sure you wash between any folds of skin and between your fingers and toes. Wash your body in the following order, switching to a new cloth after each step: The front of your neck, shoulders, and chest. Both of your arms, under your arms, and your hands. Your stomach and groin area, avoiding the genitals. Your right leg and foot. Your left leg and foot. The back of your neck, your back, and your buttocks. Do not rinse. Discard the cloth and let the area air-dry. Do not put any substances on your body afterward--such as powder, lotion, or perfume--unless you are told to do so by your health care provider. Only use lotions that are recommended by the manufacturer. Put on clean clothes or pajamas. Contact a health  care provider if: Your skin gets irritated after scrubbing. You have questions about using your solution or cloth. Get help right away if: Your eyes become very red or swollen. Your eyes itch badly. Your skin itches badly and is red or swollen. Your hearing changes. You have trouble seeing. You have swelling or tingling in your mouth or throat. You have trouble breathing. You swallow any chlorhexidine. Summary Chlorhexidine gluconate (CHG) is a germ-killing (antiseptic) solution that is used to clean the skin. Cleaning your skin with CHG may help to lower your risk for infection. You may be given CHG to use for bathing. It may be in a bottle or in a prepackaged cloth to use on your skin. Carefully follow your health care provider's instructions and the instructions on the product label. Do not use CHG if you have a chlorhexidine allergy. Contact your health care provider if your skin gets irritated after scrubbing. This information is not intended to replace advice given to you by your health care provider. Make sure you discuss any questions you have with your healthcare provider. Document Revised: 08/12/2019 Document Reviewed: 09/16/2019 Elsevier Patient Education  [place].

## 2020-10-04 ENCOUNTER — Encounter (HOSPITAL_COMMUNITY): Payer: Self-pay

## 2020-10-04 ENCOUNTER — Other Ambulatory Visit: Payer: Self-pay

## 2020-10-04 ENCOUNTER — Encounter (HOSPITAL_COMMUNITY)
Admission: RE | Admit: 2020-10-04 | Discharge: 2020-10-04 | Disposition: A | Discharge status: Home / Self Care | Payer: 59 | Source: Ambulatory Visit | Attending: Obstetrics & Gynecology | Admitting: Obstetrics & Gynecology

## 2020-10-04 ENCOUNTER — Other Ambulatory Visit: Payer: Self-pay | Admitting: Obstetrics & Gynecology

## 2020-10-04 DIAGNOSIS — Z01818 Encounter for other preprocedural examination: Secondary | ICD-10-CM | POA: Diagnosis present

## 2020-10-04 HISTORY — DX: Unspecified osteoarthritis, unspecified site: M19.90

## 2020-10-04 LAB — URINALYSIS, ROUTINE W REFLEX MICROSCOPIC
Bilirubin Urine: NEGATIVE
Glucose, UA: NEGATIVE mg/dL
Ketones, ur: NEGATIVE mg/dL
Nitrite: NEGATIVE
Protein, ur: 30 mg/dL — AB
Specific Gravity, Urine: 1.017 (ref 1.005–1.030)
pH: 7 (ref 5.0–8.0)

## 2020-10-04 LAB — COMPREHENSIVE METABOLIC PANEL
ALT: 11 U/L (ref 0–44)
AST: 16 U/L (ref 15–41)
Albumin: 3.6 g/dL (ref 3.5–5.0)
Alkaline Phosphatase: 68 U/L (ref 38–126)
Anion gap: 7 (ref 5–15)
BUN: 11 mg/dL (ref 8–23)
CO2: 27 mmol/L (ref 22–32)
Calcium: 8.7 mg/dL — ABNORMAL LOW (ref 8.9–10.3)
Chloride: 104 mmol/L (ref 98–111)
Creatinine, Ser: 0.6 mg/dL (ref 0.44–1.00)
GFR, Estimated: >60 mL/min (ref >60)
Glucose, Bld: 93 mg/dL (ref 70–99)
Potassium: 3.9 mmol/L (ref 3.5–5.1)
Sodium: 138 mmol/L (ref 135–145)
Total Bilirubin: 0.6 mg/dL (ref 0.3–1.2)
Total Protein: 6.9 g/dL (ref 6.5–8.1)

## 2020-10-04 LAB — CBC
HCT: 37.6 % (ref 36.0–46.0)
Hemoglobin: 12.2 g/dL (ref 12.0–15.0)
MCH: 30.5 pg (ref 26.0–34.0)
MCHC: 32.4 g/dL (ref 30.0–36.0)
MCV: 94 fL (ref 80.0–100.0)
Platelets: 226 10*3/uL (ref 150–400)
RBC: 4 MIL/uL (ref 3.87–5.11)
RDW: 14.1 % (ref 11.5–15.5)
WBC: 5.4 10*3/uL (ref 4.0–10.5)
nRBC: 0 % (ref 0.0–0.2)

## 2020-10-04 LAB — RAPID HIV SCREEN (HIV 1/2 AB+AG)
HIV 1/2 Antibodies: NONREACTIVE
HIV-1 P24 Antigen - HIV24: NONREACTIVE

## 2020-10-09 ENCOUNTER — Encounter: Payer: Self-pay | Admitting: Nurse Practitioner

## 2020-10-09 ENCOUNTER — Other Ambulatory Visit: Payer: Self-pay

## 2020-10-09 ENCOUNTER — Ambulatory Visit (INDEPENDENT_AMBULATORY_CARE_PROVIDER_SITE_OTHER): Payer: 59 | Admitting: Nurse Practitioner

## 2020-10-09 VITALS — BP 129/73 | HR 90 | Temp 96.9°F | Ht 62.0 in | Wt 217.0 lb

## 2020-10-09 DIAGNOSIS — I10 Essential (primary) hypertension: Secondary | ICD-10-CM | POA: Diagnosis not present

## 2020-10-09 DIAGNOSIS — M25551 Pain in right hip: Secondary | ICD-10-CM | POA: Diagnosis not present

## 2020-10-09 DIAGNOSIS — J449 Chronic obstructive pulmonary disease, unspecified: Secondary | ICD-10-CM

## 2020-10-09 DIAGNOSIS — R7303 Prediabetes: Secondary | ICD-10-CM | POA: Diagnosis not present

## 2020-10-09 DIAGNOSIS — Z0001 Encounter for general adult medical examination with abnormal findings: Secondary | ICD-10-CM | POA: Diagnosis not present

## 2020-10-09 DIAGNOSIS — H6122 Impacted cerumen, left ear: Secondary | ICD-10-CM

## 2020-10-09 MED ORDER — BUDESONIDE-FORMOTEROL FUMARATE 80-4.5 MCG/ACT IN AERO: 2.0000 | INHALATION_SPRAY | Freq: Two times a day (BID) | RESPIRATORY_TRACT | 2 refills | Status: DC

## 2020-10-09 MED ORDER — ALBUTEROL SULFATE HFA 108 (90 BASE) MCG/ACT IN AERS: 2.0000 | INHALATION_SPRAY | Freq: Four times a day (QID) | RESPIRATORY_TRACT | 2 refills | Status: DC | PRN

## 2020-10-09 NOTE — Assessment & Plan Note (Signed)
BP Readings from Last 3 Encounters:  10/09/20 129/73  09/25/20 122/75  09/13/20 135/82   -well controlled

## 2020-10-09 NOTE — Patient Instructions (Signed)
Please have fasting labs drawn 2-3 days prior to your appointment so we can discuss the results during your office visit.  

## 2020-10-09 NOTE — Assessment & Plan Note (Signed)
-  will check A1c with next set of labs

## 2020-10-09 NOTE — Assessment & Plan Note (Signed)
-  refilled meds

## 2020-10-09 NOTE — Assessment & Plan Note (Signed)
-  will use hydrogen peroxide -has surgery tomorrow, so she will contact us if she has hearing issues, otherwise we will revisit this at next OV if H2O2 doesn't fix the issue

## 2020-10-09 NOTE — Progress Notes (Signed)
Established Patient Office Visit  Subjective:  Patient ID: Michele Romero, female    DOB: Aug 28, 1960  Age: 60 y.o. MRN: [id number]  CC:  Chief Complaint  Patient presents with   Annual Exam    CPE    HPI [name] presents for physical exam.  She is having postmenopausal bleeding, and she will have a hysteroscopy uterine curettage on 6/29 with Dr. Elonda Husky.  No acute concerns.  Past Medical History:  Diagnosis Date   Arthritis    Bronchitis    Cigarette nicotine dependence without complication 77/93/9030   Complex tear of medial meniscus of right knee 01/20/2017   COPD (chronic obstructive pulmonary disease) ([place])    Ganglion cyst of wrist 06/25/2015   Migraines     Past Surgical History:  Procedure Laterality Date   APPENDECTOMY     BIOPSY  11/29/2019   Procedure: BIOPSY;  Surgeon: Harvel Quale, MD;  [facility];  Service: Gastroenterology;;  ileocecal vlve   CHOLECYSTECTOMY     COLONOSCOPY WITH PROPOFOL N/A 11/29/2019   Procedure: COLONOSCOPY WITH PROPOFOL;  Surgeon: Harvel Quale, MD;  [facility];  Service: Gastroenterology;  Laterality: N/A;  [place]     left knee   POLYPECTOMY  11/29/2019   Procedure: POLYPECTOMY;  Surgeon: Montez Morita, Quillian Quince, MD;  [facility];  Service: Gastroenterology;;  sigmoid colon    TUBAL LIGATION  1987    Family History  Problem Relation Age of Onset   Diabetes Mother    Hypertension Mother    Heart disease Mother        CHF   Stroke Mother    Heart disease Father     Social History   Socioeconomic History   Marital status: Single    Spouse name: Not on file   Number of children: 2   Years of education: Not on file   Highest education level: High school graduate  Occupational History   Not on file  Tobacco Use   Smoking status: Former    Packs/day: 0.50    Years: 30.00    Pack years: 15.00    Types: Cigarettes    Quit date:  01/01/2020    Years since quitting: 0.7   Smokeless tobacco: Never  Vaping Use   Vaping Use: Never used  Substance and Sexual Activity   Alcohol use: No   Drug use: Yes    Frequency: 6.0 times per week    Types: Marijuana    Comment: every now and then   Sexual activity: Not Currently    Birth control/protection: Surgical    Comment: tubal  Other Topics Concern   Not on file  Social History Narrative   Lives with daughter and 1 grandbaby   Son is in [place]      Enjoys: sitting outside, music, tv      Diet: eats all food groups   Caffeine: soda and tea: 2-3 cups daily   Water: 2-3 16 oz bottles      Wears seat belt    Does not use phone while driving   Oceanographer at home    No weapons    Social Determinants of Health   Financial Resource Strain: Low Risk    Difficulty of Paying Living Expenses: Not hard at all  Food Insecurity: No Food Insecurity   Worried About [place] in the Last Year: Never true   [employer] of  Food in the Last Year: Never true  Transportation Needs: No Transportation Needs   Lack of Transportation (Medical): No   Lack of Transportation (Non-Medical): No  Physical Activity: Inactive   Days of Exercise per Week: 0 days   Minutes of Exercise per Session: 0 min  Stress: No Stress Concern Present   Feeling of Stress : Not at all  Social Connections: Moderately Isolated   Frequency of Communication with Friends and Family: More than three times a week   Frequency of Social Gatherings with Friends and Family: More than three times a week   Attends Religious Services: 1 to 4 times per year   Active Member of [employer] or Organizations: No   Attends Archivist Meetings: Never   Marital Status: Never married  Human resources officer Violence: Not At Risk   Fear of Current or Ex-Partner: No   Emotionally Abused: No   Physically Abused: No   Sexually Abused: No    Outpatient Medications Prior to Visit  Medication Sig Dispense Refill    acetaminophen (TYLENOL) 500 MG tablet Take 1,000 mg by mouth every 6 (six) hours as needed for moderate pain or headache.     acetaminophen-codeine (TYLENOL #3) 300-30 MG tablet Take 1-2 tablets by mouth every 8 (eight) hours as needed. 30 tablet 0   albuterol (VENTOLIN HFA) 108 (90 Base) MCG/ACT inhaler Inhale 2 puffs into the lungs every 6 (six) hours as needed for wheezing or shortness of breath. 8 g 1   budesonide-formoterol (SYMBICORT) 80-4.5 MCG/ACT inhaler Inhale 2 puffs into the lungs 2 (two) times daily. 10.2 g 2   diphenhydrAMINE (BENADRYL) 25 MG tablet Take 25 mg by mouth daily as needed for allergies.     Multiple Vitamin (MULTIVITAMIN WITH MINERALS) TABS tablet Take 1 tablet by mouth daily.     vitamin B-12 (CYANOCOBALAMIN) 500 MCG tablet Take 500 mcg by mouth daily.     No facility-administered medications prior to visit.    Allergies  Allergen Reactions   Codeine Nausea And Vomiting and Rash    ROS Review of Systems  Constitutional: Negative.   HENT: Negative.    Eyes: Negative.   Respiratory: Negative.    Cardiovascular: Negative.   Gastrointestinal: Negative.   Endocrine: Negative.   Genitourinary:  Positive for vaginal bleeding.       Has upcoming surgery with Dr. Elonda Husky for bleeding  Musculoskeletal: Negative.   Skin: Negative.   Allergic/Immunologic: Negative.   Neurological: Negative.   Hematological: Negative.   Psychiatric/Behavioral: Negative.       Objective:    Physical Exam Constitutional:      Appearance: Normal appearance. She is obese.  HENT:     Head: Normocephalic and atraumatic.     Right Ear: External ear normal. There is impacted cerumen.     Left Ear: Tympanic membrane, ear canal and external ear normal. There is impacted cerumen.     Nose: Nose normal.     Mouth/Throat:     Mouth: Mucous membranes are moist.     Pharynx: Oropharynx is clear.  Eyes:     Extraocular Movements: Extraocular movements intact.     Conjunctiva/sclera:  Conjunctivae normal.     Pupils: Pupils are equal, round, and reactive to light.  Cardiovascular:     Rate and Rhythm: Normal rate and regular rhythm.     Pulses: Normal pulses.     Heart sounds: Normal heart sounds.  Pulmonary:     Effort: Pulmonary effort is normal.  Breath sounds: Normal breath sounds.  Abdominal:     General: Abdomen is flat. Bowel sounds are normal.     Palpations: Abdomen is soft.  Musculoskeletal:     Cervical back: Normal range of motion and neck supple.     Comments: Uses cane for ambulation; Deformities to hands from arthritis  Skin:    General: Skin is warm and dry.     Capillary Refill: Capillary refill takes less than 2 seconds.  Neurological:     General: No focal deficit present.     Mental Status: She is alert and oriented to person, place, and time.     Cranial Nerves: No cranial nerve deficit.     Sensory: No sensory deficit.     Motor: No weakness.     Coordination: Coordination normal.     Gait: Gait normal.  Psychiatric:        Mood and Affect: Mood normal.        Behavior: Behavior normal.        Thought Content: Thought content normal.        Judgment: Judgment normal.    BP 129/73 (BP Location: Right Arm, Patient Position: Sitting, Cuff Size: Large)   Pulse 90   Temp (!) 96.9 F (36.1 C) (Temporal)   Ht 5\' 2"  (1.575 m)   Wt 217 lb (98.4 kg)   LMP 04/14/2010   SpO2 97%   BMI 39.69 kg/m  Wt Readings from Last 3 Encounters:  10/09/20 217 lb (98.4 kg)  10/04/20 214 lb (97.1 kg)  09/25/20 212 lb (96.2 kg)     Health Maintenance Due  Topic Date Due   HIV Screening  Never done   Hepatitis C Screening  Never done    There are no preventive care reminders to display for this patient.  Lab Results  Component Value Date   TSH 1.66 06/25/2015   Lab Results  Component Value Date   WBC 5.4 10/04/2020   HGB 12.2 10/04/2020   HCT 37.6 10/04/2020   MCV 94.0 10/04/2020   PLT 226 10/04/2020   Lab Results  Component Value  Date   NA 138 10/04/2020   K 3.9 10/04/2020   CO2 27 10/04/2020   GLUCOSE 93 10/04/2020   BUN 11 10/04/2020   CREATININE 0.60 10/04/2020   BILITOT 0.6 10/04/2020   ALKPHOS 68 10/04/2020   AST 16 10/04/2020   ALT 11 10/04/2020   PROT 6.9 10/04/2020   ALBUMIN 3.6 10/04/2020   CALCIUM 8.7 (L) 10/04/2020   ANIONGAP 7 10/04/2020   Lab Results  Component Value Date   CHOL 150 11/02/2019   Lab Results  Component Value Date   HDL 50 11/02/2019   Lab Results  Component Value Date   LDLCALC 84 11/02/2019   Lab Results  Component Value Date   TRIG 70 11/02/2019   Lab Results  Component Value Date   CHOLHDL 3.0 11/02/2019   Lab Results  Component Value Date   HGBA1C 5.6 11/02/2019      Assessment & Plan:   Problem List Items Addressed This Visit   None  No orders of the defined types were placed in this encounter.   Follow-up: No follow-ups on file.    [name], NP

## 2020-10-09 NOTE — Assessment & Plan Note (Signed)
-  she had recent labs drawn with Dr. Elonda Husky -will order labs today, but she will have them drawn later (maybe 3 months)

## 2020-10-09 NOTE — Assessment & Plan Note (Signed)
-  uses cane for ambulation

## 2020-10-10 ENCOUNTER — Other Ambulatory Visit: Payer: Self-pay

## 2020-10-10 ENCOUNTER — Ambulatory Visit (HOSPITAL_COMMUNITY)
Admission: RE | Admit: 2020-10-10 | Discharge: 2020-10-10 | Disposition: A | Discharge status: Home / Self Care | Payer: 59 | Attending: Obstetrics & Gynecology | Admitting: Obstetrics & Gynecology

## 2020-10-10 ENCOUNTER — Ambulatory Visit (HOSPITAL_COMMUNITY): Payer: 59 | Admitting: Anesthesiology

## 2020-10-10 ENCOUNTER — Encounter (HOSPITAL_COMMUNITY): Payer: Self-pay | Admitting: Obstetrics & Gynecology

## 2020-10-10 ENCOUNTER — Encounter (HOSPITAL_COMMUNITY)
Admission: RE | Disposition: A | Discharge status: Home / Self Care | Payer: Self-pay | Source: Home / Self Care | Attending: Obstetrics & Gynecology

## 2020-10-10 DIAGNOSIS — Z885 Allergy status to narcotic agent status: Secondary | ICD-10-CM | POA: Insufficient documentation

## 2020-10-10 DIAGNOSIS — C541 Malignant neoplasm of endometrium: Secondary | ICD-10-CM | POA: Diagnosis not present

## 2020-10-10 DIAGNOSIS — N840 Polyp of corpus uteri: Secondary | ICD-10-CM | POA: Diagnosis not present

## 2020-10-10 DIAGNOSIS — R9389 Abnormal findings on diagnostic imaging of other specified body structures: Secondary | ICD-10-CM | POA: Diagnosis present

## 2020-10-10 DIAGNOSIS — N950 Postmenopausal bleeding: Secondary | ICD-10-CM | POA: Diagnosis present

## 2020-10-10 DIAGNOSIS — Z87891 Personal history of nicotine dependence: Secondary | ICD-10-CM | POA: Insufficient documentation

## 2020-10-10 HISTORY — PX: HYSTEROSCOPY WITH D & C: SHX1775

## 2020-10-10 SURGERY — DILATATION AND CURETTAGE /HYSTEROSCOPY
Anesthesia: General

## 2020-10-10 MED ORDER — KETOROLAC TROMETHAMINE 30 MG/ML IJ SOLN
30.0000 mg | Freq: Once | INTRAMUSCULAR | Status: AC
  Administered 2020-10-10: 30 mg via INTRAVENOUS

## 2020-10-10 MED ORDER — CEFAZOLIN SODIUM-DEXTROSE 2-4 GM/100ML-% IV SOLN
INTRAVENOUS | Status: AC
  Filled 2020-10-10: qty 100

## 2020-10-10 MED ORDER — CHLORHEXIDINE GLUCONATE 0.12 % MT SOLN: 15.0000 mL | Freq: Once | OROMUCOSAL | Status: AC

## 2020-10-10 MED ORDER — FENTANYL CITRATE (PF) 250 MCG/5ML IJ SOLN
INTRAMUSCULAR | Status: AC
  Filled 2020-10-10: qty 5

## 2020-10-10 MED ORDER — PROPOFOL 10 MG/ML IV BOLUS
INTRAVENOUS | Status: AC
  Filled 2020-10-10: qty 40

## 2020-10-10 MED ORDER — MIDAZOLAM HCL 2 MG/2ML IJ SOLN
INTRAMUSCULAR | Status: AC
  Filled 2020-10-10: qty 2

## 2020-10-10 MED ORDER — ONDANSETRON HCL 4 MG/2ML IJ SOLN
INTRAMUSCULAR | Status: AC
  Filled 2020-10-10: qty 2

## 2020-10-10 MED ORDER — LIDOCAINE HCL (PF) 2 % IJ SOLN
INTRAMUSCULAR | Status: AC
  Filled 2020-10-10: qty 5

## 2020-10-10 MED ORDER — ONDANSETRON HCL 4 MG/2ML IJ SOLN
INTRAMUSCULAR | Status: DC | PRN
  Administered 2020-10-10: 4 mg via INTRAVENOUS

## 2020-10-10 MED ORDER — ONDANSETRON 8 MG PO TBDP: 8.0000 mg | ORAL_TABLET | Freq: Three times a day (TID) | ORAL | 0 refills | Status: DC | PRN

## 2020-10-10 MED ORDER — FENTANYL CITRATE (PF) 100 MCG/2ML IJ SOLN
INTRAMUSCULAR | Status: DC | PRN
  Administered 2020-10-10: 50 ug via INTRAVENOUS
  Administered 2020-10-10: 25 ug via INTRAVENOUS

## 2020-10-10 MED ORDER — PROPOFOL 10 MG/ML IV BOLUS
INTRAVENOUS | Status: DC | PRN
  Administered 2020-10-10: 170 mg via INTRAVENOUS

## 2020-10-10 MED ORDER — FENTANYL CITRATE (PF) 100 MCG/2ML IJ SOLN: 25.0000 ug | INTRAMUSCULAR | Status: DC | PRN

## 2020-10-10 MED ORDER — CEFAZOLIN SODIUM-DEXTROSE 2-4 GM/100ML-% IV SOLN
2.0000 g | INTRAVENOUS | Status: AC
  Administered 2020-10-10: 2 g via INTRAVENOUS

## 2020-10-10 MED ORDER — ORAL CARE MOUTH RINSE
15.0000 mL | Freq: Once | OROMUCOSAL | Status: AC
  Administered 2020-10-10: 15 mL via OROMUCOSAL

## 2020-10-10 MED ORDER — LACTATED RINGERS IV SOLN
INTRAVENOUS | Status: DC
  Administered 2020-10-10: 1000 mL via INTRAVENOUS

## 2020-10-10 MED ORDER — KETOROLAC TROMETHAMINE 30 MG/ML IJ SOLN
INTRAMUSCULAR | Status: AC
  Filled 2020-10-10: qty 1

## 2020-10-10 MED ORDER — LIDOCAINE HCL (CARDIAC) PF 50 MG/5ML IV SOSY
PREFILLED_SYRINGE | INTRAVENOUS | Status: DC | PRN
  Administered 2020-10-10: 75 mg via INTRAVENOUS

## 2020-10-10 MED ORDER — PROPOFOL 10 MG/ML IV BOLUS
INTRAVENOUS | Status: AC
  Filled 2020-10-10: qty 20

## 2020-10-10 MED ORDER — KETOROLAC TROMETHAMINE 10 MG PO TABS: 10.0000 mg | ORAL_TABLET | Freq: Three times a day (TID) | ORAL | 0 refills | Status: DC | PRN

## 2020-10-10 MED ORDER — CHLORHEXIDINE GLUCONATE 0.12 % MT SOLN
OROMUCOSAL | Status: AC
  Filled 2020-10-10: qty 15

## 2020-10-10 MED ORDER — ONDANSETRON HCL 4 MG/2ML IJ SOLN: 4.0000 mg | Freq: Once | INTRAMUSCULAR | Status: DC | PRN

## 2020-10-10 MED ORDER — MIDAZOLAM HCL 5 MG/5ML IJ SOLN
INTRAMUSCULAR | Status: DC | PRN
  Administered 2020-10-10: 2 mg via INTRAVENOUS

## 2020-10-10 MED ORDER — SODIUM CHLORIDE 0.9 % IR SOLN
Status: DC | PRN
  Administered 2020-10-10: 1000 mL

## 2020-10-10 MED ORDER — 0.9 % SODIUM CHLORIDE (POUR BTL) OPTIME
TOPICAL | Status: DC | PRN
  Administered 2020-10-10: 1000 mL

## 2020-10-10 MED ORDER — POVIDONE-IODINE 10 % EX SWAB: 2.0000 "application " | Freq: Once | CUTANEOUS | Status: DC

## 2020-10-10 SURGICAL SUPPLY — 27 items
BAG HAMPER (MISCELLANEOUS) ×3 IMPLANT
CLOTH BEACON ORANGE TIMEOUT ST (SAFETY) ×3 IMPLANT
COVER LIGHT HANDLE STERIS (MISCELLANEOUS) ×6 IMPLANT
DRSG TELFA 3X8 NADH (GAUZE/BANDAGES/DRESSINGS) ×3 IMPLANT
GAUZE 4X4 16PLY ~~LOC~~+RFID DBL (SPONGE) ×3 IMPLANT
GLOVE ECLIPSE 8.0 STRL XLNG CF (GLOVE) ×3 IMPLANT
GLOVE SRG 8 PF TXTR STRL LF DI (GLOVE) ×1 IMPLANT
GLOVE SURG UNDER POLY LF SZ7 (GLOVE) ×6 IMPLANT
GLOVE SURG UNDER POLY LF SZ8 (GLOVE) ×3
GOWN STRL REUS W/TWL LRG LVL3 (GOWN DISPOSABLE) ×3 IMPLANT
GOWN STRL REUS W/TWL XL LVL3 (GOWN DISPOSABLE) ×3 IMPLANT
INST SET HYSTEROSCOPY (KITS) ×3 IMPLANT
IV NS 1000ML (IV SOLUTION) ×3
IV NS 1000ML BAXH (IV SOLUTION) ×1 IMPLANT
KIT TURNOVER CYSTO (KITS) ×3 IMPLANT
MANIFOLD NEPTUNE II (INSTRUMENTS) ×3 IMPLANT
MARKER SKIN DUAL TIP RULER LAB (MISCELLANEOUS) ×3 IMPLANT
NS IRRIG 1000ML POUR BTL (IV SOLUTION) ×3 IMPLANT
PACK BASIC III (CUSTOM PROCEDURE TRAY) ×3
PACK SRG BSC III STRL LF ECLPS (CUSTOM PROCEDURE TRAY) ×1 IMPLANT
PAD ARMBOARD 7.5X6 YLW CONV (MISCELLANEOUS) ×3 IMPLANT
PAD TELFA 3X4 1S STER (GAUZE/BANDAGES/DRESSINGS) ×6 IMPLANT
SET BASIN LINEN APH (SET/KITS/TRAYS/PACK) ×3 IMPLANT
SET CYSTO W/LG BORE CLAMP LF (SET/KITS/TRAYS/PACK) ×3 IMPLANT
SHEET LAVH (DRAPES) ×3 IMPLANT
TUBE CONNECTING 12'X1/4 (SUCTIONS) ×1
TUBE CONNECTING 12X1/4 (SUCTIONS) ×2 IMPLANT

## 2020-10-10 NOTE — H&P (Signed)
Preoperative History and Physical  Michele Romero is a 60 y.o. [id number] with Patient's last menstrual period was 04/14/2010. admitted for a hysteroscopy uterine curettage.  History of post menopausal bleeding, 19 mm endometrial stripe and non diagnostic office biopsy.  PMH:    Past Medical History:  Diagnosis Date   Arthritis    Bronchitis    Cigarette nicotine dependence without complication 52/77/8242   Complex tear of medial meniscus of right knee 01/20/2017   COPD (chronic obstructive pulmonary disease) ([place])    Ganglion cyst of wrist 06/25/2015   Migraines     PSH:     Past Surgical History:  Procedure Laterality Date   APPENDECTOMY     BIOPSY  11/29/2019   Procedure: BIOPSY;  Surgeon: Harvel Quale, MD;  [facility];  Service: Gastroenterology;;  ileocecal vlve   CHOLECYSTECTOMY     COLONOSCOPY WITH PROPOFOL N/A 11/29/2019   Procedure: COLONOSCOPY WITH PROPOFOL;  Surgeon: Harvel Quale, MD;  [facility];  Service: Gastroenterology;  Laterality: N/A;  [place]     left knee   POLYPECTOMY  11/29/2019   Procedure: POLYPECTOMY;  Surgeon: Harvel Quale, MD;  [facility];  Service: Gastroenterology;;  sigmoid colon    TUBAL LIGATION  1987    POb/GynH:      OB History     Gravida  3   Para  2   Term  2   Preterm      AB  1   Living  2      SAB  1   IAB      Ectopic      Multiple      Live Births              SH:   Social History   Tobacco Use   Smoking status: Former    Packs/day: 0.50    Years: 30.00    Pack years: 15.00    Types: Cigarettes    Quit date: 01/01/2020    Years since quitting: 0.7   Smokeless tobacco: Never  Vaping Use   Vaping Use: Never used  Substance Use Topics   Alcohol use: No   Drug use: Yes    Frequency: 6.0 times per week    Types: Marijuana    Comment: every now and then    FH:    Family History  Problem Relation Age of  Onset   Diabetes Mother    Hypertension Mother    Heart disease Mother        CHF   Stroke Mother    Heart disease Father      Allergies:  Allergies  Allergen Reactions   Codeine Nausea And Vomiting and Rash    Medications:       Current Facility-Administered Medications:    ceFAZolin (ANCEF) 2-4 GM/100ML-% IVPB, , , ,    ceFAZolin (ANCEF) IVPB 2g/100 mL premix, 2 g, Intravenous, On Call to OR, Florian Buff, MD   chlorhexidine (PERIDEX) 0.12 % solution, , , ,    ketorolac (TORADOL) 30 MG/ML injection, , , ,    povidone-iodine 10 % swab 2 application, 2 application, Topical, Once, Florian Buff, MD  Review of Systems:   Review of Systems  Constitutional: Negative for fever, chills, weight loss, malaise/fatigue and diaphoresis.  HENT: Negative for hearing loss, ear pain, nosebleeds, congestion, sore throat, neck pain, tinnitus and ear discharge.   Eyes:  Negative for blurred vision, double vision, photophobia, pain, discharge and redness.  Respiratory: Negative for cough, hemoptysis, sputum production, shortness of breath, wheezing and stridor.   Cardiovascular: Negative for chest pain, palpitations, orthopnea, claudication, leg swelling and PND.  Gastrointestinal: Positive for abdominal pain. Negative for heartburn, nausea, vomiting, diarrhea, constipation, blood in stool and melena.  Genitourinary: Negative for dysuria, urgency, frequency, hematuria and flank pain.  Musculoskeletal: Negative for myalgias, back pain, joint pain and falls.  Skin: Negative for itching and rash.  Neurological: Negative for dizziness, tingling, tremors, sensory change, speech change, focal weakness, seizures, loss of consciousness, weakness and headaches.  Endo/Heme/Allergies: Negative for environmental allergies and polydipsia. Does not bruise/bleed easily.  Psychiatric/Behavioral: Negative for depression, suicidal ideas, hallucinations, memory loss and substance abuse. The patient is not  nervous/anxious and does not have insomnia.      PHYSICAL EXAM:  Blood pressure 123/74, temperature 97.9 F (36.6 C), temperature source Oral, resp. rate 17, last menstrual period 04/14/2010, SpO2 98 %.    Vitals reviewed. Constitutional: She is oriented to person, place, and time. She appears well-developed and well-nourished.  HENT:  Head: Normocephalic and atraumatic.  Right Ear: External ear normal.  Left Ear: External ear normal.  Nose: Nose normal.  Mouth/Throat: Oropharynx is clear and moist.  Eyes: Conjunctivae and EOM are normal. Pupils are equal, round, and reactive to light. Right eye exhibits no discharge. Left eye exhibits no discharge. No scleral icterus.  Neck: Normal range of motion. Neck supple. No tracheal deviation present. No thyromegaly present.  Cardiovascular: Normal rate, regular rhythm, normal heart sounds and intact distal pulses.  Exam reveals no gallop and no friction rub.   No murmur heard. Respiratory: Effort normal and breath sounds normal. No respiratory distress. She has no wheezes. She has no rales. She exhibits no tenderness.  GI: Soft. Bowel sounds are normal. She exhibits no distension and no mass. There is tenderness. There is no rebound and no guarding.  Genitourinary:       Vulva is normal without lesions Vagina is pink moist without discharge Cervix normal in appearance and pap is normal Uterus is normal size, contour, position, consistency, mobility, non-tender Adnexa is negative with normal sized ovaries by sonogram  Musculoskeletal: Normal range of motion. She exhibits no edema and no tenderness.  Neurological: She is alert and oriented to person, place, and time. She has normal reflexes. She displays normal reflexes. No cranial nerve deficit. She exhibits normal muscle tone. Coordination normal.  Skin: Skin is warm and dry. No rash noted. No erythema. No pallor.  Psychiatric: She has a normal mood and affect. Her behavior is normal.  Judgment and thought content normal.    Labs: Results for orders placed or performed during the hospital encounter of 10/04/20 (from the past 336 hour(s))  CBC   Collection Time: 10/04/20  2:49 PM  Result Value Ref Range   WBC 5.4 4.0 - 10.5 K/uL   RBC 4.00 3.87 - 5.11 MIL/uL   Hemoglobin 12.2 12.0 - 15.0 g/dL   HCT 37.6 36.0 - 46.0 %   MCV 94.0 80.0 - 100.0 fL   MCH 30.5 26.0 - 34.0 pg   MCHC 32.4 30.0 - 36.0 g/dL   RDW 14.1 11.5 - 15.5 %   Platelets 226 150 - 400 K/uL   nRBC 0.0 0.0 - 0.2 %  Comprehensive metabolic panel   Collection Time: 10/04/20  2:49 PM  Result Value Ref Range   Sodium 138 135 - 145 mmol/L  Potassium 3.9 3.5 - 5.1 mmol/L   Chloride 104 98 - 111 mmol/L   CO2 27 22 - 32 mmol/L   Glucose, Bld 93 70 - 99 mg/dL   BUN 11 8 - 23 mg/dL   Creatinine, Ser 0.60 0.44 - 1.00 mg/dL   Calcium 8.7 (L) 8.9 - 10.3 mg/dL   Total Protein 6.9 6.5 - 8.1 g/dL   Albumin 3.6 3.5 - 5.0 g/dL   AST 16 15 - 41 U/L   ALT 11 0 - 44 U/L   Alkaline Phosphatase 68 38 - 126 U/L   Total Bilirubin 0.6 0.3 - 1.2 mg/dL   GFR, Estimated >60 >60 mL/min   Anion gap 7 5 - 15  Rapid HIV screen (HIV 1/2 Ab+Ag)   Collection Time: 10/04/20  2:49 PM  Result Value Ref Range   HIV-1 P24 Antigen - HIV24 NON REACTIVE NON REACTIVE   HIV 1/2 Antibodies NON REACTIVE NON REACTIVE   Interpretation (HIV Ag Ab)      A non reactive test result means that HIV 1 or HIV 2 antibodies and HIV 1 p24 antigen were not detected in the specimen.  Urinalysis, Routine w reflex microscopic Urine, Clean Catch   Collection Time: 10/04/20  2:49 PM  Result Value Ref Range   Color, Urine YELLOW YELLOW   APPearance HAZY (A) CLEAR   Specific Gravity, Urine 1.017 1.005 - 1.030   pH 7.0 5.0 - 8.0   Glucose, UA NEGATIVE NEGATIVE mg/dL   Hgb urine dipstick SMALL (A) NEGATIVE   Bilirubin Urine NEGATIVE NEGATIVE   Ketones, ur NEGATIVE NEGATIVE mg/dL   Protein, ur 30 (A) NEGATIVE mg/dL   Nitrite NEGATIVE NEGATIVE    Leukocytes,Ua SMALL (A) NEGATIVE   RBC / HPF 11-20 0 - 5 RBC/hpf   WBC, UA 11-20 0 - 5 WBC/hpf   Bacteria, UA RARE (A) NONE SEEN   Squamous Epithelial / LPF 11-20 0 - 5   Mucus PRESENT     EKG: Orders placed or performed during the hospital encounter of 10/04/20   EKG 12-Lead   EKG 12-Lead    Imaging Studies: US PELVIC COMPLETE WITH TRANSVAGINAL  Result Date: 09/18/2020 CLINICAL DATA:  Postmenopausal bleeding for 2 weeks, past history of endometrial biopsy and tubal ligation EXAM: TRANSABDOMINAL AND TRANSVAGINAL ULTRASOUND OF PELVIS TECHNIQUE: Both transabdominal and transvaginal ultrasound examinations of the pelvis were performed. Transabdominal technique was performed for global imaging of the pelvis including uterus, ovaries, adnexal regions, and pelvic cul-de-sac. It was necessary to proceed with endovaginal exam following the transabdominal exam to visualize the endometrium and LEFT ovary. COMPARISON:  None FINDINGS: Uterus Measurements: 11.3 x 8.4 x 7.5 cm = volume: 371 mL. Anteverted. Large mass/leiomyoma at anterior RIGHT uterus 6.2 x 4.6 x 5.4 cm. Additional exophytic leiomyoma at LEFT uterus 3.0 x 4.1 x 2.8 cm. Endometrium Thickness: 19 mm. Thickened, heterogeneous, abnormal appearance. Cannot exclude component of fluid within the endometrial canal. Right ovary Not visualized, likely obscured by bowel Left ovary Not visualized, likely obscured by bowel Other findings Trace free pelvic fluid.  No adnexal masses. IMPRESSION: Two uterine leiomyomata, larger measuring 6.2 cm at anterior fundus to RIGHT. Thickened abnormal heterogeneous endometrial complex as above; in the setting of post-menopausal bleeding, endometrial sampling is indicated to exclude carcinoma. If results are benign, sonohysterogram should be considered for focal lesion work-up. (Ref: Radiological Reasoning: Algorithmic Workup of Abnormal Vaginal Bleeding with Endovaginal Sonography and Sonohysterography. AJR 2008;  [id number]) Nonvisualization of ovaries. These results will be  called to the ordering clinician or representative by the Radiologist Assistant, and communication documented in the PACS or [employer]. Electronically Signed   By: Lavonia Dana M.D.   On: 09/18/2020 15:41      Assessment: Post menopausal bleeding 19 mm endometrial stripe Office EMB non diagnostic  Plan: Hysteroscopy uterine curettage  Florian Buff 10/10/2020 7:12 AM

## 2020-10-10 NOTE — Anesthesia Preprocedure Evaluation (Signed)
Anesthesia Evaluation  Patient identified by MRN, date of birth, ID band Patient awake    Reviewed: Allergy & Precautions, H&P , NPO status , Patient's Chart, lab work & pertinent test results, reviewed documented beta blocker date and time   Airway Mallampati: II  TM Distance: >3 FB Neck ROM: full    Dental no notable dental hx.    Pulmonary COPD, former smoker,    Pulmonary exam normal breath sounds clear to auscultation       Cardiovascular Exercise Tolerance: Good hypertension,  Rhythm:regular Rate:Normal     Neuro/Psych negative neurological ROS  negative psych ROS   GI/Hepatic negative GI ROS, Neg liver ROS,   Endo/Other  negative endocrine ROS  Renal/GU negative Renal ROS  negative genitourinary   Musculoskeletal   Abdominal   Peds  Hematology negative hematology ROS (+)   Anesthesia Other Findings   Reproductive/Obstetrics negative OB ROS                             Anesthesia Physical Anesthesia Plan  ASA: 3  Anesthesia Plan: General   Post-op Pain Management:    Induction:   PONV Risk Score and Plan:   Airway Management Planned:   Additional Equipment:   Intra-op Plan:   Post-operative Plan:   Informed Consent: I have reviewed the patients History and Physical, chart, labs and discussed the procedure including the risks, benefits and alternatives for the proposed anesthesia with the patient or authorized representative who has indicated his/her understanding and acceptance.     Dental Advisory Given  Plan Discussed with: CRNA  Anesthesia Plan Comments:         Anesthesia Quick Evaluation

## 2020-10-10 NOTE — Anesthesia Postprocedure Evaluation (Signed)
Anesthesia Post Note  Patient: Michele Romero  Procedure(s) Performed: DILATATION AND CURETTAGE /HYSTEROSCOPY  Patient location during evaluation: PACU Anesthesia Type: General Level of consciousness: awake and alert and oriented Pain management: pain level controlled Vital Signs Assessment: post-procedure vital signs reviewed and stable Respiratory status: spontaneous breathing Cardiovascular status: blood pressure returned to baseline and stable Postop Assessment: no apparent nausea or vomiting Anesthetic complications: no   No notable events documented.   Last Vitals:  Vitals:   10/10/20 0845 10/10/20 0851  BP: 128/84 (!) 127/99  Pulse: 75 73  Resp: 16 16  Temp:  36.7 C  SpO2: 99% 100%    Last Pain:  Vitals:   10/10/20 0851  TempSrc: Axillary  PainSc: 0-No pain                 Rome Echavarria

## 2020-10-10 NOTE — Transfer of Care (Signed)
Immediate Anesthesia Transfer of Care Note  Patient: Michele Romero  Procedure(s) Performed: DILATATION AND CURETTAGE /HYSTEROSCOPY  Patient Location: PACU  Anesthesia Type:General  Level of Consciousness: awake  Airway & Oxygen Therapy: Patient Spontanous Breathing  Post-op Assessment: Report given to RN  Post vital signs: Reviewed and stable  Last Vitals:  Vitals Value Taken Time  BP 130/87 10/10/20 0823  Temp    Pulse 84 10/10/20 0823  Resp 10 10/10/20 0823  SpO2 100 % 10/10/20 0823  Vitals shown include unvalidated device data.  Last Pain:  Vitals:   10/10/20 0655  TempSrc: Oral  PainSc: 0-No pain      Patients Stated Pain Goal: 6 (71/06/26 9485)  Complications: No notable events documented.

## 2020-10-10 NOTE — Anesthesia Procedure Notes (Signed)
Procedure Name: LMA Insertion Date/Time: 10/10/2020 7:38 AM Performed by: Ollen Bowl, CRNA Pre-anesthesia Checklist: Patient identified, Patient being monitored, Emergency Drugs available, Timeout performed and Suction available Patient Re-evaluated:Patient Re-evaluated prior to induction Oxygen Delivery Method: Circle System Utilized Preoxygenation: Pre-oxygenation with 100% oxygen Induction Type: IV induction Ventilation: Mask ventilation without difficulty LMA: LMA inserted LMA Size: 3.0 Number of attempts: 1 Placement Confirmation: positive ETCO2 and breath sounds checked- equal and bilateral

## 2020-10-10 NOTE — Op Note (Signed)
Preoperative diagnosis: Postmenopausal bleeding                                         Endometrial thickening, 19 mm   Postoperative diagnosis: Endometrial polyp with lush endometrial tissue  Procedure: Operative hysteroscopy with removal of  polyp, endometrial curettage  Surgeon: Tania Ade H   Anesthesia: Laryngeal mask airway   Findings:    The fibroid to the right was distorting the endometrial cavity and my entry was to the left There was a polyp present along with lush endometrium  Description of operation: Patient was taken to the operating room and placed in the supine position where she underwent laryngeal mask airway anesthesia. She was placed in the dorsal lithotomy position.  She was prepped and draped in the usual sterile fashion.  A Graves speculum was placed.  The cervix was grasped with a single-tooth tenaculum.  The cervix was dilated serially using Hegar dilators to allow passage of the hysteroscope.  The hysteroscope was then placed into the endometrial cavity without difficulty and the above noted findings were seen.  The polyp was removed with curetting The endometrium was thoroughly curetted with good uterine cry in all areas  Hysteroscopic reevaluation confirmed the removal of all endometrial pathology  There was good hemostasis with only suspected minor endometrial using  The patient tolerated the procedure well  She experienced minimal blood loss  She was taken to the recovery room in good stable condition  All counts were correct x3  She received 2 g of Ancef and 30 mg of Toradol preoperatively  She will be followed up in the office next week for postoperative visit   Florian Buff, MD 10/10/2020 8:14 AM

## 2020-10-11 ENCOUNTER — Encounter (HOSPITAL_COMMUNITY): Payer: Self-pay | Admitting: Obstetrics & Gynecology

## 2020-10-16 NOTE — Progress Notes (Signed)
She will meet with Dr. Elonda Husky later this week.

## 2020-10-17 LAB — SURGICAL PATHOLOGY

## 2020-10-19 ENCOUNTER — Encounter: Payer: Self-pay | Admitting: Obstetrics & Gynecology

## 2020-10-19 ENCOUNTER — Ambulatory Visit (INDEPENDENT_AMBULATORY_CARE_PROVIDER_SITE_OTHER): Payer: 59 | Admitting: Obstetrics & Gynecology

## 2020-10-19 ENCOUNTER — Other Ambulatory Visit: Payer: Self-pay

## 2020-10-19 VITALS — BP 142/91 | HR 86 | Ht 62.0 in | Wt 222.0 lb

## 2020-10-19 DIAGNOSIS — C549 Malignant neoplasm of corpus uteri, unspecified: Secondary | ICD-10-CM

## 2020-10-19 NOTE — Progress Notes (Signed)
  HPI: Patient returns for routine postoperative follow-up having undergone hysteroscopy with endometrial curettage on 10/10/20.  The patient's immediate postoperative recovery has been unremarkable. Since hospital discharge the patient reports minimal bleeding no cramping.   Current Outpatient Medications: acetaminophen (TYLENOL) 500 MG tablet, Take 1,000 mg by mouth every 6 (six) hours as needed for moderate pain or headache., Disp: , Rfl:  acetaminophen-codeine (TYLENOL #3) 300-30 MG tablet, Take 1-2 tablets by mouth every 8 (eight) hours as needed., Disp: 30 tablet, Rfl: 0 albuterol (VENTOLIN HFA) 108 (90 Base) MCG/ACT inhaler, Inhale 2 puffs into the lungs every 6 (six) hours as needed for wheezing or shortness of breath., Disp: 8 g, Rfl: 2 budesonide-formoterol (SYMBICORT) 80-4.5 MCG/ACT inhaler, Inhale 2 puffs into the lungs 2 (two) times daily., Disp: 10.2 g, Rfl: 2 diphenhydrAMINE (BENADRYL) 25 MG tablet, Take 25 mg by mouth daily as needed for allergies., Disp: , Rfl:  ketorolac (TORADOL) 10 MG tablet, Take 1 tablet (10 mg total) by mouth every 8 (eight) hours as needed., Disp: 15 tablet, Rfl: 0 Multiple Vitamin (MULTIVITAMIN WITH MINERALS) TABS tablet, Take 1 tablet by mouth daily., Disp: , Rfl:  ondansetron (ZOFRAN ODT) 8 MG disintegrating tablet, Take 1 tablet (8 mg total) by mouth every 8 (eight) hours as needed for nausea or vomiting., Disp: 8 tablet, Rfl: 0 vitamin B-12 (CYANOCOBALAMIN) 500 MCG tablet, Take 500 mcg by mouth daily., Disp: , Rfl:   No current facility-administered medications for this visit.    Blood pressure (!) 142/91, pulse 86, height 5\' 2"  (1.575 m), weight 222 lb (100.7 kg), last menstrual period 04/14/2010.  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

## 2020-10-22 ENCOUNTER — Other Ambulatory Visit: Payer: Self-pay | Admitting: Physician Assistant

## 2020-10-23 ENCOUNTER — Encounter: Payer: Self-pay | Admitting: Gynecologic Oncology

## 2020-10-23 NOTE — Patient Instructions (Addendum)
Preparing for your Surgery  Plan for surgery on November 15, 2020 with Dr. Everitt Amber at [facility]. You will be scheduled for a robotic assisted total laparoscopic hysterectomy (removal of the uterus and cervix), bilateral salpingo-oophorectomy (removal of both ovaries and fallopian tubes), sentinel lymph node biopsy, possible lymph node dissection, mini laparotomy (larger incision on your abdomen to remove the uterus through).   Pre-operative Testing -You will receive a phone call from presurgical testing at [facility] to arrange for a pre-operative appointment and lab work.  -Bring your insurance card, copy of an advanced directive if applicable, medication list  -At that visit, you will be asked to sign a consent for a possible blood transfusion in case a transfusion becomes necessary during surgery.  The need for a blood transfusion is rare but having consent is a necessary part of your care.     -STOP YOUR ASPIRIN NOW. You should not be taking blood thinners or aspirin at least ten days prior to surgery unless instructed by your surgeon.  -Do not take supplements such as fish oil (omega 3), red yeast rice, turmeric before your surgery. You want to avoid medications with aspirin in them including headache powders such as BC or Goody's), Excedrin migraine.  Day Before Surgery at [place] will be asked to take in a light diet the day before surgery. You will be advised you can have clear liquids up until 3 hours before your surgery.    Eat a light diet the day before surgery.  Examples including soups, broths, toast, yogurt, mashed potatoes.  AVOID GAS PRODUCING FOODS. Things to avoid include carbonated beverages (fizzy beverages, sodas), raw fruits and raw vegetables (uncooked), or beans.   If your bowels are filled with gas, your surgeon will have difficulty visualizing your pelvic organs which increases your surgical risks.  Your role in  recovery Your role is to become active as soon as directed by your doctor, while still giving yourself time to heal.  Rest when you feel tired. You will be asked to do the following in order to speed your recovery:  - Cough and breathe deeply. This helps to clear and expand your lungs and can prevent pneumonia after surgery.  - [place]. Do mild physical activity. Walking or moving your legs help your circulation and body functions return to normal. Do not try to get up or walk alone the first time after surgery.   -If you develop swelling on one leg or the other, pain in the back of your leg, redness/warmth in one of your legs, please call the office or go to the Emergency Room to have a doppler to rule out a blood clot. For shortness of breath, chest pain-seek care in the Emergency Room as soon as possible. - Actively manage your pain. Managing your pain lets you move in comfort. We will ask you to rate your pain on a scale of zero to 10. It is your responsibility to tell your doctor or nurse where and how much you hurt so your pain can be treated.  Special Considerations -If you are diabetic, you may be placed on insulin after surgery to have closer control over your blood sugars to promote healing and recovery.  This does not mean that you will be discharged on insulin.  If applicable, your oral antidiabetics will be resumed when you are tolerating a solid diet.  -Your final pathology results from surgery should  be available around one week after surgery and the results will be relayed to you when available.  -FMLA forms can be faxed to [phone] and please allow 5-7 business days for completion.  Pain Management After Surgery -You have been prescribed your pain medication and bowel regimen medications before surgery so that you can have these available when you are discharged from the hospital. The pain medication is for use ONLY AFTER surgery and a new prescription will  not be given.   -Make sure that you have Tylenol and Ibuprofen at home to use on a regular basis after surgery for pain control. We recommend alternating the medications every hour to six hours since they work differently and are processed in the body differently for pain relief.  -Review the attached handout on narcotic use and their risks and side effects.   Bowel Regimen -You have been prescribed Sennakot-S to take nightly to prevent constipation especially if you are taking the narcotic pain medication intermittently.  It is important to prevent constipation and drink adequate amounts of liquids. You can stop taking this medication when you are not taking pain medication and you are back on your normal bowel routine.  Risks of Surgery Risks of surgery are low but include bleeding, infection, damage to surrounding structures, re-operation, blood clots, and very rarely death.   Blood Transfusion Information (For the consent to be signed before surgery)  We will be checking your blood type before surgery so in case of emergencies, we will know what type of blood you would need.                                            WHAT IS A BLOOD TRANSFUSION?  A transfusion is the replacement of blood or some of its parts. Blood is made up of multiple cells which provide different functions. Red blood cells carry oxygen and are used for blood loss replacement. White blood cells fight against infection. Platelets control bleeding. Plasma helps clot blood. Other blood products are available for specialized needs, such as hemophilia or other clotting disorders. BEFORE THE TRANSFUSION  Who gives blood for transfusions?  You may be able to donate blood to be used at a later date on yourself (autologous donation). Relatives can be asked to donate blood. This is generally not any safer than if you have received blood from a stranger. The same precautions are taken to ensure safety when a relative's blood  is donated. Healthy volunteers who are fully evaluated to make sure their blood is safe. This is blood bank blood. Transfusion therapy is the safest it has ever been in the practice of medicine. Before blood is taken from a donor, a complete history is taken to make sure that person has no history of diseases nor engages in risky social behavior (examples are intravenous drug use or sexual activity with multiple partners). The donor's travel history is screened to minimize risk of transmitting infections, such as malaria. The donated blood is tested for signs of infectious diseases, such as HIV and hepatitis. The blood is then tested to be sure it is compatible with you in order to minimize the chance of a transfusion reaction. If you or a relative donates blood, this is often done in anticipation of surgery and is not appropriate for emergency situations. It takes many days to process the donated blood. RISKS AND  COMPLICATIONS Although transfusion therapy is very safe and saves many lives, the main dangers of transfusion include:  Getting an infectious disease. Developing a transfusion reaction. This is an allergic reaction to something in the blood you were given. Every precaution is taken to prevent this. The decision to have a blood transfusion has been considered carefully by your caregiver before blood is given. Blood is not given unless the benefits outweigh the risks.  AFTER SURGERY INSTRUCTIONS  Return to work: 4 weeks if applicable  Activity: 1. Be up and out of the bed during the day.  Take a nap if needed.  You may walk up steps but be careful and use the hand rail.  Stair climbing will tire you more than you think, you may need to stop part way and rest.   2. No lifting or straining for 6 weeks over 10 pounds. No pushing, pulling, straining for 6 weeks.  3. No driving for around 1 week(s).  Do not drive if you are taking narcotic pain medicine and make sure that your reaction time has  returned.   4. You can shower as soon as the next day after surgery. Shower daily.  Use your regular soap and water (not directly on the incision) and pat your incision(s) dry afterwards; don't rub.  No tub baths or submerging your body in water until cleared by your surgeon. If you have the soap that was given to you by pre-surgical testing that was used before surgery, you do not need to use it afterwards because this can irritate your incisions.   5. No sexual activity and nothing in the vagina for 8 weeks.  6. You may experience a small amount of clear drainage from your incisions, which is normal.  If the drainage persists, increases, or changes color please call the office.  7. Do not use creams, lotions, or ointments such as neosporin on your incisions after surgery until advised by your surgeon because they can cause removal of the dermabond glue on your incisions.    8. You may experience vaginal spotting after surgery or around the 6-8 week mark from surgery when the stitches at the top of the vagina begin to dissolve.  The spotting is normal but if you experience heavy bleeding, call our office.  9. Take Tylenol or ibuprofen first for pain and only use narcotic pain medication for severe pain not relieved by the Tylenol or Ibuprofen.  Monitor your Tylenol intake to a max of 4,000 mg in a 24 hour period. You can alternate these medications after surgery.  Diet: 1. Low sodium Heart Healthy Diet is recommended but you are cleared to resume your normal (before surgery) diet after your procedure.  2. It is safe to use a laxative, such as Miralax or Colace, if you have difficulty moving your bowels. You have been prescribed Sennakot at bedtime every evening to keep bowel movements regular and to prevent constipation.    Wound Care: 1. Keep clean and dry.  Shower daily.  Reasons to call the Doctor: Fever - Oral temperature greater than 100.4 degrees Fahrenheit Foul-smelling vaginal  discharge Difficulty urinating Nausea and vomiting Increased pain at the site of the incision that is unrelieved with pain medicine. Difficulty breathing with or without chest pain New calf pain especially if only on one side Sudden, continuing increased vaginal bleeding with or without clots.   Contacts: For questions or concerns you should contact:  Dr. Everitt Amber at [place], [place]  at [phone]  After Hours: call [phone] and have the GYN Oncologist paged/contacted (after 5 pm or on the weekends).  Messages sent via mychart are for non-urgent matters and are not responded to after hours so for urgent needs, please call the after hours number.

## 2020-10-24 ENCOUNTER — Other Ambulatory Visit: Payer: Self-pay | Admitting: Gynecologic Oncology

## 2020-10-24 ENCOUNTER — Inpatient Hospital Stay (HOSPITAL_BASED_OUTPATIENT_CLINIC_OR_DEPARTMENT_OTHER): Payer: 59 | Admitting: Gynecologic Oncology

## 2020-10-24 ENCOUNTER — Inpatient Hospital Stay: Payer: 59 | Attending: Gynecologic Oncology | Admitting: Gynecologic Oncology

## 2020-10-24 ENCOUNTER — Encounter: Payer: Self-pay | Admitting: Gynecologic Oncology

## 2020-10-24 ENCOUNTER — Other Ambulatory Visit: Payer: Self-pay

## 2020-10-24 VITALS — BP 152/90 | HR 77 | Temp 97.8°F | Resp 18 | Ht 62.0 in | Wt 209.0 lb

## 2020-10-24 DIAGNOSIS — J449 Chronic obstructive pulmonary disease, unspecified: Secondary | ICD-10-CM | POA: Insufficient documentation

## 2020-10-24 DIAGNOSIS — Z6838 Body mass index (BMI) 38.0-38.9, adult: Secondary | ICD-10-CM | POA: Diagnosis not present

## 2020-10-24 DIAGNOSIS — Z79899 Other long term (current) drug therapy: Secondary | ICD-10-CM | POA: Insufficient documentation

## 2020-10-24 DIAGNOSIS — E669 Obesity, unspecified: Secondary | ICD-10-CM | POA: Diagnosis not present

## 2020-10-24 DIAGNOSIS — M1611 Unilateral primary osteoarthritis, right hip: Secondary | ICD-10-CM | POA: Diagnosis not present

## 2020-10-24 DIAGNOSIS — C549 Malignant neoplasm of corpus uteri, unspecified: Secondary | ICD-10-CM

## 2020-10-24 DIAGNOSIS — Z7982 Long term (current) use of aspirin: Secondary | ICD-10-CM | POA: Diagnosis not present

## 2020-10-24 DIAGNOSIS — Z87891 Personal history of nicotine dependence: Secondary | ICD-10-CM | POA: Insufficient documentation

## 2020-10-24 DIAGNOSIS — C541 Malignant neoplasm of endometrium: Secondary | ICD-10-CM | POA: Insufficient documentation

## 2020-10-24 MED ORDER — SENNOSIDES-DOCUSATE SODIUM 8.6-50 MG PO TABS
2.0000 | ORAL_TABLET | Freq: Every day | ORAL | 0 refills | Status: DC
Start: 2020-10-24 — End: 2021-06-19

## 2020-10-24 MED ORDER — TRAMADOL HCL 50 MG PO TABS: 50.0000 mg | ORAL_TABLET | Freq: Four times a day (QID) | ORAL | 0 refills | Status: DC | PRN

## 2020-10-24 MED ORDER — IBUPROFEN 800 MG PO TABS: 800.0000 mg | ORAL_TABLET | Freq: Three times a day (TID) | ORAL | 0 refills | Status: DC | PRN

## 2020-10-24 NOTE — Progress Notes (Signed)
Patient here for consultation with Dr. Everitt Amber and for a pre-operative discussion prior to her scheduled surgery on November 15, 2020. She is scheduled for a robotic assisted total laparoscopic hysterectomy, bilateral salpingo-oophorectomy, sentinel lymph node biopsy, possible lymph node dissection, mini laparotomy. The surgery was discussed in detail.  See after visit summary for additional details.    Discussed post-op pain management in detail including the aspects of the enhanced recovery pathway.  Advised her that a new prescription would be sent in for tramadol and it is only to be used for after her upcoming surgery.  We discussed the use of tylenol post-op and to monitor for a maximum of 4,000 mg in a 24 hour period.  Also prescribed sennakot to be used after surgery and to hold if having loose stools.  Discussed bowel regimen in detail.     Discussed the use of lovenox pre-op, SCDs, and measures to take at home to prevent DVT including frequent mobility.  Reportable signs and symptoms of DVT discussed. Post-operative instructions discussed and expectations for after surgery. Incisional care discussed as well including reportable signs and symptoms including erythema, drainage, wound separation.     10 minutes spent with the patient.  Verbalizing understanding of material discussed. No needs or concerns voiced at the end of the visit.   Advised patient to call for any needs.  Advised that her post-operative medications had been prescribed and could be picked up at any time. CT scan instructions discussed and contrast given.  This appointment is included in the global surgical bundle as pre-operative teaching and has no charge.

## 2020-10-24 NOTE — Progress Notes (Signed)
Consult Note: Gyn-Onc  Consult was requested by Dr. Elonda Husky for the evaluation of Michele Romero 60 y.o. female  CC:  Chief Complaint  Patient presents with   Mixed Mullerian uterine sarcoma [facility])    Assessment/Plan:  Michele Romero  is a 60 y.o.  year old P2 with grade 3 carcinosarcoma of the endometrium.   We will perform staging CT imaging.   A detailed discussion was held with the patient with regard to to her endometrial cancer diagnosis. We discussed the standard management options for uterine cancer which includes surgery followed possibly by adjuvant therapy depending on the results of surgery. The surgical management include a robotic assisted total hysterectomy and removal of the tubes and ovaries with sampling of lymph nodes. If a minimally invasive approach is not feasible, a laparotomy may be necessary (including for specimen delivery). The patient has been counseled about these surgical options and the risks of surgery in general including infection, bleeding, damage to surrounding structures (including bowel, bladder, ureters, nerves or vessels), and the postoperative risks of PE/ DVT, and lymphedema. After counseling and consideration of her options, she is in agreement to proceed with robotic assisted total hysterectomy with bilateral sapingo-oophorectomy and SLN biopsy.   She has a bulky uterus and I anticipate it's size to be >250gms and for it to require a minilaparotomy for specimen delivery.  I explained that her obesity, comorbidities and bulky uterus place her at higher risk for complications.  She will hold her ASA preoperatively.   She will be seen by anesthesia for preoperative clearance and discussion of postoperative pain management.  She was given the opportunity to ask questions, which were answered to her satisfaction, and she is agreement with the above mentioned plan of care.   We explained that robotic hysterectomy is typically an outpatient procedure  with same day discharge provided that she is meeting appropriate discharge criteria from the PACU. We provided extensive counseling regarding post-operative expectations for recovery and restrictions/limitations. We provided information regarding multi-modal pain therapy and the importance of avoidance of opioids. I informed the patient that I anticipated a 4 week recovery time off of work.  We explained that after surgery we will review her definitive pathology and determine if adjuvant therapy is recommended, however, with a carcinosarcoma tumor she will almost certainly require additional treatments such as chemotherapy and radiation and I informed her of this expectation.   HPI: Michele Romero is a 60 year old P2 who was seen in consultation at the request of Dr Elonda Husky for evaluation and treatment of grade 3 carcinosarcoma endometrial cancer.   Her symptoms began in May, 2022 with postmenopausal bleeding. She had not been seen by her gynecologist, Dr. Glo Herring, for more than 10 years and therefore needed to reestablish care with his practice. She saw  [facility]  for a new patient visit fairly promptly on September 13, 2020.  Work-up of symptoms included a Pap smear, ultrasound, office biopsy and eventually D&C. Pap smear in 6-22 was benign and negative for high-risk HPV. Transvaginal US on 09/14/2020 showed an enlarged uterus measuring 11.3 x 8.4 x 7.5 cm with 2 fibroids one in the anterior right uterus measuring 6 cm and the other in the left uterus measuring 4 cm.  The endometrium was thickened to 19 mm.  The ovaries were not visualized. Endometrial sampling with an office P[place]elle biopsy on 09/13/2020 showed predominantly blood with no endometrium identified. Therefore she was taken to the operating room for definitive  diagnosis with a hysteroscopy D&C on 10/10/2020 which revealed carcinosarcoma  The patient's medical history is significant for obesity with a BMI of 38 kg per metered squared, of note she is 5  foot 2 inches.  She has had an appendectomy at age 28 for what she reports was a ruptured appendix.  She is also had an open cholecystectomy at age 69.  She had had 2 prior vaginal deliveries.  She has a medical history significant for COPD but does not require oxygen and is no longer a smoker.  She has severe arthritis in her right hip and has had planned hip replacement surgery.  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  Her family history is remarkable for a brother with colon cancer in his 23s.  She is unclear if he underwent genetic testing.  The patient herself had a colonoscopy within the year of 2021 which she reported was normal.  Current Meds:  Outpatient Encounter Medications as of 10/24/2020  Medication Sig   acetaminophen (TYLENOL) 500 MG tablet Take 1,000 mg by mouth every 6 (six) hours as needed for moderate pain or headache.   albuterol (VENTOLIN HFA) 108 (90 Base) MCG/ACT inhaler Inhale 2 puffs into the lungs every 6 (six) hours as needed for wheezing or shortness of breath.   budesonide-formoterol (SYMBICORT) 80-4.5 MCG/ACT inhaler Inhale 2 puffs into the lungs 2 (two) times daily.   diphenhydrAMINE (BENADRYL) 25 MG tablet Take 25 mg by mouth daily as needed for allergies.   ibuprofen (ADVIL) 800 MG tablet Take 1 tablet (800 mg total) by mouth every 8 (eight) hours as needed for moderate pain. For AFTER surgery only   Multiple Vitamin (MULTIVITAMIN WITH MINERALS) TABS tablet Take 1 tablet by mouth daily.   ondansetron (ZOFRAN ODT) 8 MG disintegrating tablet Take 1 tablet (8 mg total) by mouth every 8 (eight) hours as needed for nausea or vomiting.   senna-docusate (SENOKOT-S) 8.6-50 MG tablet Take 2 tablets by mouth at bedtime. For AFTER surgery, do not take if having diarrhea   traMADol (ULTRAM) 50 MG tablet Take 1 tablet (50 mg total) by mouth every 6 (six) hours as needed for severe pain. For AFTER surgery only, do not take and drive   vitamin Q-00 (CYANOCOBALAMIN) 500  MCG tablet Take 500 mcg by mouth daily.   [DISCONTINUED] acetaminophen-codeine (TYLENOL #3) 300-30 MG tablet Take 1-2 tablets by mouth every 8 (eight) hours as needed.   [DISCONTINUED] ketorolac (TORADOL) 10 MG tablet Take 1 tablet (10 mg total) by mouth every 8 (eight) hours as needed.   No facility-administered encounter medications on file as of 10/24/2020.    Allergy:  Allergies  Allergen Reactions   Codeine Nausea And Vomiting and Rash    Social Hx:   Social History   Socioeconomic History   Marital status: Single    Spouse name: Not on file   Number of children: 2   Years of education: Not on file   Highest education level: High school graduate  Occupational History   Not on file  Tobacco Use   Smoking status: Former    Packs/day: 0.50    Years: 30.00    Pack years: 15.00    Types: Cigarettes    Quit date: 01/01/2020    Years since quitting: 0.8    Passive exposure: Never   Smokeless tobacco: Never  Vaping Use   Vaping Use: Never used  Substance and Sexual Activity   Alcohol use: No  Drug use: Yes    Frequency: 6.0 times per week    Types: Marijuana    Comment: every now and then   Sexual activity: Not Currently    Birth control/protection: Surgical    Comment: tubal  Other Topics Concern   Not on file  Social History Narrative   Lives with daughter and 1 Eldridge Abrahams   Son is in [place]      Enjoys: sitting outside, music, tv      Diet: eats all food groups   Caffeine: soda and tea: 2-3 cups daily   Water: 2-3 16 oz bottles      Wears seat belt    Does not use phone while driving   Oceanographer at home    No weapons    Social Determinants of Health   Financial Resource Strain: Low Risk    Difficulty of Paying Living Expenses: Not hard at all  Food Insecurity: No Food Insecurity   Worried About Charity fundraiser in the Last Year: Never true   Arboriculturist in the Last Year: Never true  Transportation Needs: No Transportation Needs   Lack of  Transportation (Medical): No   Lack of Transportation (Non-Medical): No  Physical Activity: Inactive   Days of Exercise per Week: 0 days   Minutes of Exercise per Session: 0 min  Stress: No Stress Concern Present   Feeling of Stress : Not at all  Social Connections: Moderately Isolated   Frequency of Communication with Friends and Family: More than three times a week   Frequency of Social Gatherings with Friends and Family: More than three times a week   Attends Religious Services: 1 to 4 times per year   Active Member of [employer] or Organizations: No   Attends Archivist Meetings: Never   Marital Status: Never married  Human resources officer Violence: Not At Risk   Fear of Current or Ex-Partner: No   Emotionally Abused: No   Physically Abused: No   Sexually Abused: No    Past Surgical Hx:  Past Surgical History:  Procedure Laterality Date   APPENDECTOMY     BIOPSY  11/29/2019   Procedure: BIOPSY;  Surgeon: Harvel Quale, MD;  Location: [facility];  Service: Gastroenterology;;  ileocecal vlve   CHOLECYSTECTOMY     COLONOSCOPY WITH PROPOFOL N/A 11/29/2019   Procedure: COLONOSCOPY WITH PROPOFOL;  Surgeon: Harvel Quale, MD;  Location: [facility];  Service: Gastroenterology;  Laterality: N/A;  930   HYSTEROSCOPY WITH D & C N/A 10/10/2020   Procedure: DILATATION AND CURETTAGE /HYSTEROSCOPY;  Surgeon: Florian Buff, MD;  Location: [facility];  Service: Gynecology;  Laterality: N/A;   KNEE ARTHROPLASTY     left knee   POLYPECTOMY  11/29/2019   Procedure: POLYPECTOMY;  Surgeon: Harvel Quale, MD;  Location: [facility];  Service: Gastroenterology;;  sigmoid colon    TUBAL LIGATION  1987    Past Medical Hx:  Past Medical History:  Diagnosis Date   Arthritis    Bronchitis    Cigarette nicotine dependence without complication 18/56/3149   Complex tear of medial meniscus of right knee 01/20/2017   COPD (chronic obstructive pulmonary disease)  ([place])    Ganglion cyst of wrist 06/25/2015   Migraines     Past Gynecological History:  SVD x 2 Patient's last menstrual period was 04/14/2010.  Family Hx:  Family History  Problem Relation Age of Onset   Diabetes Mother  Hypertension Mother    Heart disease Mother        CHF   Stroke Mother    Heart disease Father    Colon cancer Brother    Breast cancer Neg Hx    Ovarian cancer Neg Hx    Endometrial cancer Neg Hx    Prostate cancer Neg Hx    Pancreatic cancer Neg Hx     Review of Systems:  Constitutional  Feels well,    ENT Normal appearing ears and nares bilaterally Skin/Breast  No rash, sores, jaundice, itching, dryness Cardiovascular  No chest pain, shortness of breath, or edema  Pulmonary  No cough or wheeze.  Gastro Intestinal  No nausea, vomitting, or diarrhoea. No bright red blood per rectum, no abdominal pain, change in bowel movement, or constipation.  Genito Urinary  No frequency, urgency, dysuria, + postmenopausal bleeding Musculo Skeletal  No myalgia, arthralgia, joint swelling or pain  Neurologic  No weakness, numbness, change in gait,  Psychology  No depression, anxiety, insomnia.   Vitals:  Blood pressure (!) 152/90, pulse 77, temperature 97.8 F (36.6 C), temperature source Tympanic, resp. rate 18, height 5\' 2"  (1.575 m), weight 209 lb (94.8 kg), last menstrual period 04/14/2010, SpO2 100 %.  Physical Exam: WD in NAD Neck  Supple NROM, without any enlargements.  Lymph Node Survey No cervical supraclavicular or inguinal adenopathy Cardiovascular  Well perfused peripheries Lungs  Increased WOB Skin  No rash/lesions/breakdown  Psychiatry  Alert and oriented to person, place, and time  Abdomen  Normoactive bowel sounds, abdomen soft, non-tender and obese without evidence of hernia.  Back No CVA tenderness Genito Urinary  Vulva/vagina: Normal external female genitalia.   No lesions. No discharge or bleeding.  Bladder/urethra:  No  lesions or masses, well supported bladder  Vagina: normal, no lesions  Cervix: Normal appearing, no lesions.  Uterus: Bulky, broad, minimally moblie, no parametrial involvement or nodularity.  Adnexa: no discrete masses. Rectal  deferred Extremities  No bilateral cyanosis, clubbing or edema.  60 minutes of total time was spent for this patient encounter, including preparation, face-to-face counseling with the patient and coordination of care, and documentation of the encounter.   Thereasa Solo, MD  10/24/2020, 10:19 AM

## 2020-10-24 NOTE — H&P (View-Only) (Signed)
Consult Note: Gyn-Onc  Consult was requested by Dr. Elonda Husky for the evaluation of Michele Romero 60 y.o. female  CC:  Chief Complaint  Patient presents with   Mixed Mullerian uterine sarcoma [facility])    Assessment/Plan:  Michele Romero  is a 60 y.o.  year old P2 with grade 3 carcinosarcoma of the endometrium.   We will perform staging CT imaging.   A detailed discussion was held with the patient with regard to to her endometrial cancer diagnosis. We discussed the standard management options for uterine cancer which includes surgery followed possibly by adjuvant therapy depending on the results of surgery. The surgical management include a robotic assisted total hysterectomy and removal of the tubes and ovaries with sampling of lymph nodes. If a minimally invasive approach is not feasible, a laparotomy may be necessary (including for specimen delivery). The patient has been counseled about these surgical options and the risks of surgery in general including infection, bleeding, damage to surrounding structures (including bowel, bladder, ureters, nerves or vessels), and the postoperative risks of PE/ DVT, and lymphedema. After counseling and consideration of her options, she is in agreement to proceed with robotic assisted total hysterectomy with bilateral sapingo-oophorectomy and SLN biopsy.   She has a bulky uterus and I anticipate it's size to be >250gms and for it to require a minilaparotomy for specimen delivery.  I explained that her obesity, comorbidities and bulky uterus place her at higher risk for complications.  She will hold her ASA preoperatively.   She will be seen by anesthesia for preoperative clearance and discussion of postoperative pain management.  She was given the opportunity to ask questions, which were answered to her satisfaction, and she is agreement with the above mentioned plan of care.   We explained that robotic hysterectomy is typically an outpatient procedure  with same day discharge provided that she is meeting appropriate discharge criteria from the PACU. We provided extensive counseling regarding post-operative expectations for recovery and restrictions/limitations. We provided information regarding multi-modal pain therapy and the importance of avoidance of opioids. I informed the patient that I anticipated a 4 week recovery time off of work.  We explained that after surgery we will review her definitive pathology and determine if adjuvant therapy is recommended, however, with a carcinosarcoma tumor she will almost certainly require additional treatments such as chemotherapy and radiation and I informed her of this expectation.   HPI: Michele Romero is a 60 year old P2 who was seen in consultation at the request of Dr Elonda Husky for evaluation and treatment of grade 3 carcinosarcoma endometrial cancer.   Her symptoms began in May, 2022 with postmenopausal bleeding. She had not been seen by her gynecologist, Dr. Glo Herring, for more than 10 years and therefore needed to reestablish care with his practice. She saw  [facility]  for a new patient visit fairly promptly on September 13, 2020.  Work-up of symptoms included a Pap smear, ultrasound, office biopsy and eventually D&C. Pap smear in 6-22 was benign and negative for high-risk HPV. Transvaginal US on 09/14/2020 showed an enlarged uterus measuring 11.3 x 8.4 x 7.5 cm with 2 fibroids one in the anterior right uterus measuring 6 cm and the other in the left uterus measuring 4 cm.  The endometrium was thickened to 19 mm.  The ovaries were not visualized. Endometrial sampling with an office P[place] biopsy on 09/13/2020 showed predominantly blood with no endometrium identified. Therefore she was taken to the operating room for definitive  diagnosis with a hysteroscopy D&C on 10/10/2020 which revealed carcinosarcoma  The patient's medical history is significant for obesity with a BMI of 38 kg per metered squared, of note she is 5  foot 2 inches.  She has had an appendectomy at age 35 for what she reports was a ruptured appendix.  She is also had an open cholecystectomy at age 51.  She had had 2 prior vaginal deliveries.  She has a medical history significant for COPD but does not require oxygen and is no longer a smoker.  She has severe arthritis in her right hip and has had planned hip replacement surgery.  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  Her family history is remarkable for a brother with colon cancer in his 60s.  She is unclear if he underwent genetic testing.  The patient herself had a colonoscopy within the year of 2021 which she reported was normal.  Current Meds:  Outpatient Encounter Medications as of 10/24/2020  Medication Sig   acetaminophen (TYLENOL) 500 MG tablet Take 1,000 mg by mouth every 6 (six) hours as needed for moderate pain or headache.   albuterol (VENTOLIN HFA) 108 (90 Base) MCG/ACT inhaler Inhale 2 puffs into the lungs every 6 (six) hours as needed for wheezing or shortness of breath.   budesonide-formoterol (SYMBICORT) 80-4.5 MCG/ACT inhaler Inhale 2 puffs into the lungs 2 (two) times daily.   diphenhydrAMINE (BENADRYL) 25 MG tablet Take 25 mg by mouth daily as needed for allergies.   ibuprofen (ADVIL) 800 MG tablet Take 1 tablet (800 mg total) by mouth every 8 (eight) hours as needed for moderate pain. For AFTER surgery only   Multiple Vitamin (MULTIVITAMIN WITH MINERALS) TABS tablet Take 1 tablet by mouth daily.   ondansetron (ZOFRAN ODT) 8 MG disintegrating tablet Take 1 tablet (8 mg total) by mouth every 8 (eight) hours as needed for nausea or vomiting.   senna-docusate (SENOKOT-S) 8.6-50 MG tablet Take 2 tablets by mouth at bedtime. For AFTER surgery, do not take if having diarrhea   traMADol (ULTRAM) 50 MG tablet Take 1 tablet (50 mg total) by mouth every 6 (six) hours as needed for severe pain. For AFTER surgery only, do not take and drive   vitamin T-51 (CYANOCOBALAMIN) 500  MCG tablet Take 500 mcg by mouth daily.   [DISCONTINUED] acetaminophen-codeine (TYLENOL #3) 300-30 MG tablet Take 1-2 tablets by mouth every 8 (eight) hours as needed.   [DISCONTINUED] ketorolac (TORADOL) 10 MG tablet Take 1 tablet (10 mg total) by mouth every 8 (eight) hours as needed.   No facility-administered encounter medications on file as of 10/24/2020.    Allergy:  Allergies  Allergen Reactions   Codeine Nausea And Vomiting and Rash    Social Hx:   Social History   Socioeconomic History   Marital status: Single    Spouse name: Not on file   Number of children: 2   Years of education: Not on file   Highest education level: High school graduate  Occupational History   Not on file  Tobacco Use   Smoking status: Former    Packs/day: 0.50    Years: 30.00    Pack years: 15.00    Types: Cigarettes    Quit date: 01/01/2020    Years since quitting: 0.8    Passive exposure: Never   Smokeless tobacco: Never  Vaping Use   Vaping Use: Never used  Substance and Sexual Activity   Alcohol use: No  Drug use: Yes    Frequency: 6.0 times per week    Types: Marijuana    Comment: every now and then   Sexual activity: Not Currently    Birth control/protection: Surgical    Comment: tubal  Other Topics Concern   Not on file  Social History Narrative   Lives with daughter and 1 Eldridge Abrahams   Son is in [place]      Enjoys: sitting outside, music, tv      Diet: eats all food groups   Caffeine: soda and tea: 2-3 cups daily   Water: 2-3 16 oz bottles      Wears seat belt    Does not use phone while driving   Oceanographer at home    No weapons    Social Determinants of Health   Financial Resource Strain: Low Risk    Difficulty of Paying Living Expenses: Not hard at all  Food Insecurity: No Food Insecurity   Worried About Charity fundraiser in the Last Year: Never true   Arboriculturist in the Last Year: Never true  Transportation Needs: No Transportation Needs   Lack of  Transportation (Medical): No   Lack of Transportation (Non-Medical): No  Physical Activity: Inactive   Days of Exercise per Week: 0 days   Minutes of Exercise per Session: 0 min  Stress: No Stress Concern Present   Feeling of Stress : Not at all  Social Connections: Moderately Isolated   Frequency of Communication with Friends and Family: More than three times a week   Frequency of Social Gatherings with Friends and Family: More than three times a week   Attends Religious Services: 1 to 4 times per year   Active Member of [employer] or Organizations: No   Attends Archivist Meetings: Never   Marital Status: Never married  Human resources officer Violence: Not At Risk   Fear of Current or Ex-Partner: No   Emotionally Abused: No   Physically Abused: No   Sexually Abused: No    Past Surgical Hx:  Past Surgical History:  Procedure Laterality Date   APPENDECTOMY     BIOPSY  11/29/2019   Procedure: BIOPSY;  Surgeon: Harvel Quale, MD;  Location: [facility];  Service: Gastroenterology;;  ileocecal vlve   CHOLECYSTECTOMY     COLONOSCOPY WITH PROPOFOL N/A 11/29/2019   Procedure: COLONOSCOPY WITH PROPOFOL;  Surgeon: Harvel Quale, MD;  Location: [facility];  Service: Gastroenterology;  Laterality: N/A;  930   HYSTEROSCOPY WITH D & C N/A 10/10/2020   Procedure: DILATATION AND CURETTAGE /HYSTEROSCOPY;  Surgeon: Florian Buff, MD;  Location: [facility];  Service: Gynecology;  Laterality: N/A;   KNEE ARTHROPLASTY     left knee   POLYPECTOMY  11/29/2019   Procedure: POLYPECTOMY;  Surgeon: Harvel Quale, MD;  Location: [facility];  Service: Gastroenterology;;  sigmoid colon    TUBAL LIGATION  1987    Past Medical Hx:  Past Medical History:  Diagnosis Date   Arthritis    Bronchitis    Cigarette nicotine dependence without complication 82/99/3716   Complex tear of medial meniscus of right knee 01/20/2017   COPD (chronic obstructive pulmonary disease)  ([place])    Ganglion cyst of wrist 06/25/2015   Migraines     Past Gynecological History:  SVD x 2 Patient's last menstrual period was 04/14/2010.  Family Hx:  Family History  Problem Relation Age of Onset   Diabetes Mother  Hypertension Mother    Heart disease Mother        CHF   Stroke Mother    Heart disease Father    Colon cancer Brother    Breast cancer Neg Hx    Ovarian cancer Neg Hx    Endometrial cancer Neg Hx    Prostate cancer Neg Hx    Pancreatic cancer Neg Hx     Review of Systems:  Constitutional  Feels well,    ENT Normal appearing ears and nares bilaterally Skin/Breast  No rash, sores, jaundice, itching, dryness Cardiovascular  No chest pain, shortness of breath, or edema  Pulmonary  No cough or wheeze.  Gastro Intestinal  No nausea, vomitting, or diarrhoea. No bright red blood per rectum, no abdominal pain, change in bowel movement, or constipation.  Genito Urinary  No frequency, urgency, dysuria, + postmenopausal bleeding Musculo Skeletal  No myalgia, arthralgia, joint swelling or pain  Neurologic  No weakness, numbness, change in gait,  Psychology  No depression, anxiety, insomnia.   Vitals:  Blood pressure (!) 152/90, pulse 77, temperature 97.8 F (36.6 C), temperature source Tympanic, resp. rate 18, height 5\' 2"  (1.575 m), weight 209 lb (94.8 kg), last menstrual period 04/14/2010, SpO2 100 %.  Physical Exam: WD in NAD Neck  Supple NROM, without any enlargements.  Lymph Node Survey No cervical supraclavicular or inguinal adenopathy Cardiovascular  Well perfused peripheries Lungs  Increased WOB Skin  No rash/lesions/breakdown  Psychiatry  Alert and oriented to person, place, and time  Abdomen  Normoactive bowel sounds, abdomen soft, non-tender and obese without evidence of hernia.  Back No CVA tenderness Genito Urinary  Vulva/vagina: Normal external female genitalia.   No lesions. No discharge or bleeding.  Bladder/urethra:  No  lesions or masses, well supported bladder  Vagina: normal, no lesions  Cervix: Normal appearing, no lesions.  Uterus: Bulky, broad, minimally moblie, no parametrial involvement or nodularity.  Adnexa: no discrete masses. Rectal  deferred Extremities  No bilateral cyanosis, clubbing or edema.  60 minutes of total time was spent for this patient encounter, including preparation, face-to-face counseling with the patient and coordination of care, and documentation of the encounter.   Thereasa Solo, MD  10/24/2020, 10:19 AM

## 2020-10-24 NOTE — Patient Instructions (Signed)
Preparing for your Surgery   Plan for surgery on November 15, 2020 with Dr. Everitt Amber at [facility]. You will be scheduled for a robotic assisted total laparoscopic hysterectomy (removal of the uterus and cervix), bilateral salpingo-oophorectomy (removal of both ovaries and fallopian tubes), sentinel lymph node biopsy, possible lymph node dissection, mini laparotomy (larger incision on your abdomen to remove the uterus through).    Pre-operative Testing -You will receive a phone call from presurgical testing at [facility] to arrange for a pre-operative appointment and lab work.   -Bring your insurance card, copy of an advanced directive if applicable, medication list   -At that visit, you will be asked to sign a consent for a possible blood transfusion in case a transfusion becomes necessary during surgery.  The need for a blood transfusion is rare but having consent is a necessary part of your care.      -STOP YOUR ASPIRIN NOW. You should not be taking blood thinners or aspirin at least ten days prior to surgery unless instructed by your surgeon.   -Do not take supplements such as fish oil (omega 3), red yeast rice, turmeric before your surgery. You want to avoid medications with aspirin in them including headache powders such as BC or Goody's), Excedrin migraine.   Day Before Surgery at [place] will be asked to take in a light diet the day before surgery. You will be advised you can have clear liquids up until 3 hours before your surgery.     Eat a light diet the day before surgery.  Examples including soups, broths, toast, yogurt, mashed potatoes.  AVOID GAS PRODUCING FOODS. Things to avoid include carbonated beverages (fizzy beverages, sodas), raw fruits and raw vegetables (uncooked), or beans.   If your bowels are filled with gas, your surgeon will have difficulty visualizing your pelvic organs which increases your surgical risks.   Your role in  recovery Your role is to become active as soon as directed by your doctor, while still giving yourself time to heal.  Rest when you feel tired. You will be asked to do the following in order to speed your recovery:   - Cough and breathe deeply. This helps to clear and expand your lungs and can prevent pneumonia after surgery. - Flora. Do mild physical activity. Walking or moving your legs help your circulation and body functions return to normal. Do not try to get up or walk alone the first time after surgery.   -If you develop swelling on one leg or the other, pain in the back of your leg, redness/warmth in one of your legs, please call the office or go to the Emergency Room to have a doppler to rule out a blood clot. For shortness of breath, chest pain-seek care in the Emergency Room as soon as possible. - Actively manage your pain. Managing your pain lets you move in comfort. We will ask you to rate your pain on a scale of zero to 10. It is your responsibility to tell your doctor or nurse where and how much you hurt so your pain can be treated.   Special Considerations -If you are diabetic, you may be placed on insulin after surgery to have closer control over your blood sugars to promote healing and recovery.  This does not mean that you will be discharged on insulin.  If applicable, your oral antidiabetics will be resumed when you are tolerating a  solid diet.   -Your final pathology results from surgery should be available around one week after surgery and the results will be relayed to you when available.   -FMLA forms can be faxed to [phone] and please allow 5-7 business days for completion.   Pain Management After Surgery -You have been prescribed your pain medication and bowel regimen medications before surgery so that you can have these available when you are discharged from the hospital. The pain medication is for use ONLY AFTER surgery and a new prescription  will not be given.   -Make sure that you have Tylenol and Ibuprofen at home to use on a regular basis after surgery for pain control. We recommend alternating the medications every hour to six hours since they work differently and are processed in the body differently for pain relief.   -Review the attached handout on narcotic use and their risks and side effects.   Bowel Regimen -You have been prescribed Sennakot-S to take nightly to prevent constipation especially if you are taking the narcotic pain medication intermittently.  It is important to prevent constipation and drink adequate amounts of liquids. You can stop taking this medication when you are not taking pain medication and you are back on your normal bowel routine.   Risks of Surgery Risks of surgery are low but include bleeding, infection, damage to surrounding structures, re-operation, blood clots, and very rarely death.     Blood Transfusion Information (For the consent to be signed before surgery)   We will be checking your blood type before surgery so in case of emergencies, we will know what type of blood you would need.                                             WHAT IS A BLOOD TRANSFUSION?   A transfusion is the replacement of blood or some of its parts. Blood is made up of multiple cells which provide different functions. Red blood cells carry oxygen and are used for blood loss replacement. White blood cells fight against infection. Platelets control bleeding. Plasma helps clot blood. Other blood products are available for specialized needs, such as hemophilia or other clotting disorders. BEFORE THE TRANSFUSION Who gives blood for transfusions? You may be able to donate blood to be used at a later date on yourself (autologous donation). Relatives can be asked to donate blood. This is generally not any safer than if you have received blood from a stranger. The same precautions are taken to ensure safety when a  relative's blood is donated. Healthy volunteers who are fully evaluated to make sure their blood is safe. This is blood bank blood. Transfusion therapy is the safest it has ever been in the practice of medicine. Before blood is taken from a donor, a complete history is taken to make sure that person has no history of diseases nor engages in risky social behavior (examples are intravenous drug use or sexual activity with multiple partners). The donor's travel history is screened to minimize risk of transmitting infections, such as malaria. The donated blood is tested for signs of infectious diseases, such as HIV and hepatitis. The blood is then tested to be sure it is compatible with you in order to minimize the chance of a transfusion reaction. If you or a relative donates blood, this is often done in anticipation of surgery  and is not appropriate for emergency situations. It takes many days to process the donated blood. RISKS AND COMPLICATIONS Although transfusion therapy is very safe and saves many lives, the main dangers of transfusion include: Getting an infectious disease. Developing a transfusion reaction. This is an allergic reaction to something in the blood you were given. Every precaution is taken to prevent this. The decision to have a blood transfusion has been considered carefully by your caregiver before blood is given. Blood is not given unless the benefits outweigh the risks.   AFTER SURGERY INSTRUCTIONS   Return to work: 4 weeks if applicable   Activity: 1. Be up and out of the bed during the day.  Take a nap if needed.  You may walk up steps but be careful and use the hand rail.  Stair climbing will tire you more than you think, you may need to stop part way and rest.   2. No lifting or straining for 6 weeks over 10 pounds. No pushing, pulling, straining for 6 weeks.   3. No driving for around 1 week(s).  Do not drive if you are taking narcotic pain medicine and make sure that  your reaction time has returned.   4. You can shower as soon as the next day after surgery. Shower daily.  Use your regular soap and water (not directly on the incision) and pat your incision(s) dry afterwards; don't rub.  No tub baths or submerging your body in water until cleared by your surgeon. If you have the soap that was given to you by pre-surgical testing that was used before surgery, you do not need to use it afterwards because this can irritate your incisions.   5. No sexual activity and nothing in the vagina for 8 weeks.   6. You may experience a small amount of clear drainage from your incisions, which is normal.  If the drainage persists, increases, or changes color please call the office.   7. Do not use creams, lotions, or ointments such as neosporin on your incisions after surgery until advised by your surgeon because they can cause removal of the dermabond glue on your incisions.     8. You may experience vaginal spotting after surgery or around the 6-8 week mark from surgery when the stitches at the top of the vagina begin to dissolve.  The spotting is normal but if you experience heavy bleeding, call our office.   9. Take Tylenol or ibuprofen first for pain and only use narcotic pain medication for severe pain not relieved by the Tylenol or Ibuprofen.  Monitor your Tylenol intake to a max of 4,000 mg in a 24 hour period. You can alternate these medications after surgery.   Diet: 1. Low sodium Heart Healthy Diet is recommended but you are cleared to resume your normal (before surgery) diet after your procedure.   2. It is safe to use a laxative, such as Miralax or Colace, if you have difficulty moving your bowels. You have been prescribed Sennakot at bedtime every evening to keep bowel movements regular and to prevent constipation.     Wound Care: 1. Keep clean and dry.  Shower daily.   Reasons to call the Doctor: Fever - Oral temperature greater than 100.4 degrees  Fahrenheit Foul-smelling vaginal discharge Difficulty urinating Nausea and vomiting Increased pain at the site of the incision that is unrelieved with pain medicine. Difficulty breathing with or without chest pain New calf pain especially if only on one side  Sudden, continuing increased vaginal bleeding with or without clots.   Contacts: For questions or concerns you should contact:   Dr. Everitt Amber at [phone]   Joylene John, NP at [phone]   After Hours: call [phone] and have the GYN Oncologist paged/contacted (after 5 pm or on the weekends).   Messages sent via mychart are for non-urgent matters and are not responded to after hours so for urgent needs, please call the after hours number.

## 2020-10-26 ENCOUNTER — Other Ambulatory Visit (HOSPITAL_COMMUNITY): Payer: 59

## 2020-10-29 ENCOUNTER — Other Ambulatory Visit: Payer: Self-pay

## 2020-10-29 ENCOUNTER — Encounter (HOSPITAL_COMMUNITY): Payer: Self-pay | Admitting: Radiology

## 2020-10-29 ENCOUNTER — Ambulatory Visit (HOSPITAL_COMMUNITY)
Admission: RE | Admit: 2020-10-29 | Discharge: 2020-10-29 | Disposition: A | Discharge status: Home / Self Care | Payer: 59 | Source: Ambulatory Visit | Attending: Gynecologic Oncology | Admitting: Gynecologic Oncology

## 2020-10-29 DIAGNOSIS — C549 Malignant neoplasm of corpus uteri, unspecified: Secondary | ICD-10-CM | POA: Diagnosis present

## 2020-10-29 MED ORDER — IOHEXOL 300 MG/ML  SOLN
100.0000 mL | Freq: Once | INTRAMUSCULAR | Status: AC | PRN
  Administered 2020-10-29: 100 mL via INTRAVENOUS

## 2020-10-30 ENCOUNTER — Ambulatory Visit: Admit: 2020-10-30 | Payer: 59 | Admitting: Orthopaedic Surgery

## 2020-10-30 SURGERY — ARTHROPLASTY, HIP, TOTAL, ANTERIOR APPROACH
Anesthesia: Spinal | Site: Hip | Laterality: Right

## 2020-10-31 ENCOUNTER — Other Ambulatory Visit: Payer: Self-pay | Admitting: Gynecologic Oncology

## 2020-10-31 ENCOUNTER — Other Ambulatory Visit: Payer: Self-pay

## 2020-10-31 ENCOUNTER — Encounter (HOSPITAL_BASED_OUTPATIENT_CLINIC_OR_DEPARTMENT_OTHER): Payer: Self-pay | Admitting: Gynecologic Oncology

## 2020-10-31 DIAGNOSIS — C801 Malignant (primary) neoplasm, unspecified: Secondary | ICD-10-CM

## 2020-10-31 DIAGNOSIS — K639 Disease of intestine, unspecified: Secondary | ICD-10-CM

## 2020-10-31 DIAGNOSIS — C549 Malignant neoplasm of corpus uteri, unspecified: Secondary | ICD-10-CM

## 2020-10-31 DIAGNOSIS — Z9989 Dependence on other enabling machines and devices: Secondary | ICD-10-CM

## 2020-10-31 HISTORY — DX: Dependence on other enabling machines and devices: Z99.89

## 2020-10-31 HISTORY — DX: Malignant (primary) neoplasm, unspecified: C80.1

## 2020-10-31 NOTE — Progress Notes (Addendum)
Spoke w/ via phone for pre-op interview---PT Lab needs dos----     none          Lab results------has lab appt 11-12-2020 1300 for cbc cmp t &s ua and chest xray, ekg 10-04-2020 epic COVID test -----patient states asymptomatic no test needed Arrive at -------1200 pm  Light diet day before, NPO after MN NO Solid Food.  Clear liquids from MN until---1100 am then npo Med rec completed Medications to take morning of surgery -----albuterol inhaler prn and symbicort and bring with you Diabetic medication -----n/a Patient instructed no nail polish to be worn day of surgery Patient instructed to bring photo id and insurance card day of surgery Patient aware to have Driver (ride ) / caregiver   dtr in law driver/ mother clara or dtr crystal to stay  for 24 hours after surgery  Patient Special Instructions -----none Pre-Op special Istructions -----none Patient verbalized understanding of instructions that were given at this phone interview. Patient denies shortness of breath, chest pain, fever, cough at this phone interview.   Pt reports stopping 81 mg aspirin 10-24-2020 after seeing dr Denman George

## 2020-10-31 NOTE — Progress Notes (Signed)
Your procedure is scheduled on 11-15-2020  Arrive  AT [place] :1200 PM .   Call this number if you have problems the morning of surgery  :[phone].   OUR ADDRESS IS [address].  WE ARE LOCATED IN [facility].  PLEASE BRING YOUR INSURANCE CARD AND PHOTO ID DAY OF SURGERY.  ONLY ONE PERSON ALLOWED IN FACILITY WAITING AREA.                                     REMEMBER:  Eat a light diet the day before surgery.  Examples including soups, broths, toast, yogurt, mashed potatoes.  Things to avoid include carbonated beverages (fizzy beverages), raw fruits and raw vegetables, or beans.   If your bowels are filled with gas, your surgeon will have difficulty visualizing your pelvic organs which increases your surgical risks.     DO NOT EAT FOOD, CANDY GUM OR MINTS  AFTER MIDNIGHT . YOU MAY HAVE CLEAR LIQUIDS FROM MIDNIGHT UNTIL 1200 PM. NO CLEAR LIQUIDS AFTER  1200 PM DAY OF SURGERY.   YOU MAY  BRUSH YOUR TEETH MORNING OF SURGERY AND RINSE YOUR MOUTH OUT, NO CHEWING GUM CANDY OR MINTS.    CLEAR LIQUID DIET   Foods Allowed                                                                     Foods Excluded  Coffee and tea, regular and decaf                             liquids that you cannot  Plain Jell-O any favor except red or purple                                           see through such as: Fruit ices (not with fruit pulp)                                     milk, soups, orange juice  Iced Popsicles                                    All solid food                                Cranberry, grape and apple juices Sports drinks like Gatorade Lightly seasoned clear broth or consume(fat free) Sugar, honey syrup  Sample Menu Breakfast                                Lunch  Supper Cranberry juice                    Beef broth                            Chicken broth Jell-O                                      Grape juice                           Apple juice Coffee or tea                        Jell-O                                      Popsicle                                                Coffee or tea                        Coffee or tea  _____________________________________________________________________     TAKE THESE MEDICATIONS MORNING OF SURGERY WITH A SIP OF WATER:   USE ALBUTEROL INHALER IF NEEDED AND SYMBICORT AND BRING INHALERS WITH YOU.   ONE VISITOR IS ALLOWED IN WAITING ROOM ONLY DAY OF SURGERY.  NO VISITOR MAY SPEND THE NIGHT.  VISITOR ARE ALLOWED TO STAY UNTIL 800 PM.                                    DO NOT WEAR JEWERLY, MAKE UP. DO NOT WEAR LOTIONS, POWDERS, PERFUMES OR NAIL POLISH. DO NOT SHAVE FOR 48 HOURS PRIOR TO DAY OF SURGERY. MEN MAY SHAVE FACE AND NECK. CONTACTS, GLASSES, OR DENTURES MAY NOT BE WORN TO SURGERY.                                    [facility] IS NOT RESPONSIBLE  FOR ANY BELONGINGS.                                                                    Marland Kitchen           [facility] - Preparing for Surgery Before surgery, you can play an important role.  Because skin is not sterile, your skin needs to be as free of germs as possible.  You can reduce the number of germs on your skin by washing with CHG (chlorahexidine gluconate) soap before surgery.  CHG is an antiseptic cleaner which kills germs and bonds with the skin to continue killing germs even after washing. Please DO NOT use if you have  an allergy to CHG or antibacterial soaps.  If your skin becomes reddened/irritated stop using the CHG and inform your nurse when you arrive at Short Stay. Do not shave (including legs and underarms) for at least 48 hours prior to the first CHG shower.  You may shave your face/neck. Please follow these instructions carefully:  1.  Shower with CHG Soap the night before surgery and the  morning of Surgery.  2.  If you choose to wash your hair, wash your hair first as  usual with your  normal  shampoo.  3.  After you shampoo, rinse your hair and body thoroughly to remove the  shampoo.                            4.  Use CHG as you would any other liquid soap.  You can apply chg directly  to the skin and wash                      Gently with a scrungie or clean washcloth.  5.  Apply the CHG Soap to your body ONLY FROM THE NECK DOWN.   Do not use on face/ open                           Wound or open sores. Avoid contact with eyes, ears mouth and genitals (private parts).                       Wash face,  Genitals (private parts) with your normal soap.             6.  Wash thoroughly, paying special attention to the area where your surgery  will be performed.  7.  Thoroughly rinse your body with warm water from the neck down.  8.  DO NOT shower/wash with your normal soap after using and rinsing off  the CHG Soap.                9.  Pat yourself dry with a clean towel.            10.  Wear clean pajamas.            11.  Place clean sheets on your bed the night of your first shower and do not  sleep with pets. Day of Surgery : Do not apply any lotions/deodorants the morning of surgery.  Please wear clean clothes to the hospital/surgery center.  FAILURE TO FOLLOW THESE INSTRUCTIONS MAY RESULT IN THE CANCELLATION OF YOUR SURGERY PATIENT SIGNATURE_________________________________  NURSE SIGNATURE__________________________________  ________________________________________________________________________                                                        QUESTIONS Hansel Feinstein PRE OP NURSE PHONE [phone].

## 2020-11-01 ENCOUNTER — Telehealth: Payer: Self-pay | Admitting: Oncology

## 2020-11-01 NOTE — Telephone Encounter (Addendum)
Called [facility] back and advised them of canceled referral.

## 2020-11-01 NOTE — Telephone Encounter (Addendum)
Called [facility] regarding urgent referral.  They said patient will need to be reviewed by the physician and their first opening is on 12/11/2019.  Joylene John, NP notified.

## 2020-11-01 NOTE — Addendum Note (Signed)
Addended by: Joylene John D on: 11/01/2020 08:53 AM   Modules accepted: Orders

## 2020-11-05 ENCOUNTER — Telehealth: Payer: Self-pay | Admitting: Oncology

## 2020-11-05 NOTE — Telephone Encounter (Signed)
Called [facility] back and reviewed scan results. She verbalized understanding and said her granddaughter was in labor so she didn't understand during our first call.

## 2020-11-05 NOTE — Telephone Encounter (Signed)
Called [facility] and reviewed CT scan results from 10/29/20.  Advised her that there is a mass in the endometrium seen with was expected with her diagnosis.  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  There was not obvious signs of metastatic disease.  She verbalized understanding and did not have any questions.

## 2020-11-12 ENCOUNTER — Other Ambulatory Visit: Payer: Self-pay

## 2020-11-12 ENCOUNTER — Encounter (HOSPITAL_COMMUNITY)
Admission: RE | Admit: 2020-11-12 | Discharge: 2020-11-12 | Disposition: A | Discharge status: Home / Self Care | Payer: 59 | Source: Ambulatory Visit | Attending: Gynecologic Oncology | Admitting: Gynecologic Oncology

## 2020-11-12 ENCOUNTER — Ambulatory Visit (HOSPITAL_COMMUNITY)
Admission: RE | Admit: 2020-11-12 | Discharge: 2020-11-12 | Disposition: A | Discharge status: Home / Self Care | Payer: 59 | Source: Ambulatory Visit | Attending: Gynecologic Oncology | Admitting: Gynecologic Oncology

## 2020-11-12 ENCOUNTER — Ambulatory Visit (HOSPITAL_COMMUNITY): Payer: 59

## 2020-11-12 DIAGNOSIS — E669 Obesity, unspecified: Secondary | ICD-10-CM | POA: Diagnosis not present

## 2020-11-12 DIAGNOSIS — C541 Malignant neoplasm of endometrium: Secondary | ICD-10-CM | POA: Diagnosis not present

## 2020-11-12 DIAGNOSIS — N800 Endometriosis of uterus: Secondary | ICD-10-CM | POA: Diagnosis not present

## 2020-11-12 DIAGNOSIS — M1611 Unilateral primary osteoarthritis, right hip: Secondary | ICD-10-CM | POA: Diagnosis not present

## 2020-11-12 DIAGNOSIS — J449 Chronic obstructive pulmonary disease, unspecified: Secondary | ICD-10-CM | POA: Diagnosis present

## 2020-11-12 DIAGNOSIS — Z79899 Other long term (current) drug therapy: Secondary | ICD-10-CM | POA: Diagnosis not present

## 2020-11-12 DIAGNOSIS — N83201 Unspecified ovarian cyst, right side: Secondary | ICD-10-CM | POA: Diagnosis not present

## 2020-11-12 DIAGNOSIS — Z6839 Body mass index (BMI) 39.0-39.9, adult: Secondary | ICD-10-CM | POA: Diagnosis not present

## 2020-11-12 DIAGNOSIS — C542 Malignant neoplasm of myometrium: Secondary | ICD-10-CM | POA: Diagnosis not present

## 2020-11-12 DIAGNOSIS — Z87891 Personal history of nicotine dependence: Secondary | ICD-10-CM | POA: Diagnosis not present

## 2020-11-12 DIAGNOSIS — C775 Secondary and unspecified malignant neoplasm of intrapelvic lymph nodes: Secondary | ICD-10-CM | POA: Diagnosis not present

## 2020-11-12 DIAGNOSIS — Z7951 Long term (current) use of inhaled steroids: Secondary | ICD-10-CM | POA: Diagnosis not present

## 2020-11-12 DIAGNOSIS — D259 Leiomyoma of uterus, unspecified: Secondary | ICD-10-CM | POA: Diagnosis not present

## 2020-11-12 DIAGNOSIS — N83202 Unspecified ovarian cyst, left side: Secondary | ICD-10-CM | POA: Diagnosis not present

## 2020-11-12 DIAGNOSIS — Z885 Allergy status to narcotic agent status: Secondary | ICD-10-CM | POA: Diagnosis not present

## 2020-11-12 LAB — URINALYSIS, ROUTINE W REFLEX MICROSCOPIC
Bacteria, UA: NONE SEEN
Bilirubin Urine: NEGATIVE
Glucose, UA: NEGATIVE mg/dL
Ketones, ur: NEGATIVE mg/dL
Nitrite: NEGATIVE
Protein, ur: 100 mg/dL — AB
RBC / HPF: >50 RBC/hpf — ABNORMAL HIGH (ref 0–5)
Specific Gravity, Urine: 1.031 — ABNORMAL HIGH (ref 1.005–1.030)
pH: 5 (ref 5.0–8.0)

## 2020-11-12 LAB — CBC
HCT: 35.8 % — ABNORMAL LOW (ref 36.0–46.0)
Hemoglobin: 11.6 g/dL — ABNORMAL LOW (ref 12.0–15.0)
MCH: 30.1 pg (ref 26.0–34.0)
MCHC: 32.4 g/dL (ref 30.0–36.0)
MCV: 93 fL (ref 80.0–100.0)
Platelets: 238 10*3/uL (ref 150–400)
RBC: 3.85 MIL/uL — ABNORMAL LOW (ref 3.87–5.11)
RDW: 14.2 % (ref 11.5–15.5)
WBC: 5 10*3/uL (ref 4.0–10.5)
nRBC: 0 % (ref 0.0–0.2)

## 2020-11-12 LAB — COMPREHENSIVE METABOLIC PANEL
ALT: 11 U/L (ref 0–44)
AST: 14 U/L — ABNORMAL LOW (ref 15–41)
Albumin: 3.4 g/dL — ABNORMAL LOW (ref 3.5–5.0)
Alkaline Phosphatase: 71 U/L (ref 38–126)
Anion gap: 4 — ABNORMAL LOW (ref 5–15)
BUN: 12 mg/dL (ref 8–23)
CO2: 28 mmol/L (ref 22–32)
Calcium: 8.5 mg/dL — ABNORMAL LOW (ref 8.9–10.3)
Chloride: 110 mmol/L (ref 98–111)
Creatinine, Ser: 0.73 mg/dL (ref 0.44–1.00)
GFR, Estimated: >60 mL/min (ref >60)
Glucose, Bld: 91 mg/dL (ref 70–99)
Potassium: 4.2 mmol/L (ref 3.5–5.1)
Sodium: 142 mmol/L (ref 135–145)
Total Bilirubin: 0.5 mg/dL (ref 0.3–1.2)
Total Protein: 6.9 g/dL (ref 6.5–8.1)

## 2020-11-13 ENCOUNTER — Encounter: Payer: 59 | Admitting: Physician Assistant

## 2020-11-13 LAB — URINE CULTURE: Culture: <10000 — AB

## 2020-11-14 ENCOUNTER — Telehealth: Payer: Self-pay

## 2020-11-14 NOTE — Anesthesia Preprocedure Evaluation (Addendum)
Anesthesia Evaluation  Patient identified by MRN, date of birth, ID band Patient awake    Reviewed: Allergy & Precautions, NPO status , Patient's Chart, lab work & pertinent test results  Airway Mallampati: I  TM Distance: >3 FB Neck ROM: Full    Dental no notable dental hx. (+) Dental Advisory Given, Teeth Intact, Missing,    Pulmonary asthma , COPD,  COPD inhaler, former smoker,    Pulmonary exam normal breath sounds clear to auscultation       Cardiovascular Exercise Tolerance: Good Normal cardiovascular exam Rhythm:Regular Rate:Normal     Neuro/Psych negative neurological ROS     GI/Hepatic Neg liver ROS, GERD  ,  Endo/Other  negative endocrine ROS  Renal/GU Lab Results      Component                Value               Date                      CREATININE               0.73                11/12/2020                BUN                      12                  11/12/2020                NA                       142                 11/12/2020                K                        4.2                 11/12/2020                CL                       110                 11/12/2020                CO2                      28                  11/12/2020             Female GU complaint Endometrial CA    Musculoskeletal  (+) Arthritis ,   Abdominal (+) + obese (BMI 34.20),   Peds  Hematology Lab Results      Component                Value               Date                      WBC  5.0                 11/12/2020                HGB                      11.6 (L)            11/12/2020                HCT                      35.8 (L)            11/12/2020                MCV                      93.0                11/12/2020                PLT                      238                 11/12/2020              Anesthesia Other Findings All: codeine  Reproductive/Obstetrics                             Anesthesia Physical Anesthesia Plan  ASA: 3  Anesthesia Plan: General   Post-op Pain Management:    Induction: Intravenous  PONV Risk Score and Plan: 4 or greater and Treatment may vary due to age or medical condition, Midazolam, Dexamethasone and Ondansetron  Airway Management Planned: Oral ETT  Additional Equipment: None  Intra-op Plan:   Post-operative Plan: Extubation in OR  Informed Consent: I have reviewed the patients History and Physical, chart, labs and discussed the procedure including the risks, benefits and alternatives for the proposed anesthesia with the patient or authorized representative who has indicated his/her understanding and acceptance.     Dental advisory given  Plan Discussed with: CRNA and Anesthesiologist  Anesthesia Plan Comments: (GA w Lidocaine infusion + Ketamine )       Anesthesia Quick Evaluation

## 2020-11-14 NOTE — Telephone Encounter (Signed)
Telephone call to check on pre-operative status.  Patient compliant with pre-operative instructions.  Reinforced NPO after midnight.  No questions or concerns voiced.  Instructed to call for any needs.   

## 2020-11-15 ENCOUNTER — Other Ambulatory Visit: Payer: Self-pay

## 2020-11-15 ENCOUNTER — Ambulatory Visit (HOSPITAL_BASED_OUTPATIENT_CLINIC_OR_DEPARTMENT_OTHER): Payer: 59 | Admitting: Anesthesiology

## 2020-11-15 ENCOUNTER — Encounter (HOSPITAL_BASED_OUTPATIENT_CLINIC_OR_DEPARTMENT_OTHER)
Admission: RE | Disposition: A | Discharge status: Home / Self Care | Payer: Self-pay | Source: Home / Self Care | Attending: Gynecologic Oncology

## 2020-11-15 ENCOUNTER — Encounter (HOSPITAL_BASED_OUTPATIENT_CLINIC_OR_DEPARTMENT_OTHER): Payer: Self-pay | Admitting: Gynecologic Oncology

## 2020-11-15 ENCOUNTER — Ambulatory Visit (HOSPITAL_BASED_OUTPATIENT_CLINIC_OR_DEPARTMENT_OTHER)
Admission: RE | Admit: 2020-11-15 | Discharge: 2020-11-15 | Disposition: A | Discharge status: Home / Self Care | Payer: 59 | Attending: Gynecologic Oncology | Admitting: Gynecologic Oncology

## 2020-11-15 DIAGNOSIS — C775 Secondary and unspecified malignant neoplasm of intrapelvic lymph nodes: Secondary | ICD-10-CM | POA: Insufficient documentation

## 2020-11-15 DIAGNOSIS — D259 Leiomyoma of uterus, unspecified: Secondary | ICD-10-CM | POA: Insufficient documentation

## 2020-11-15 DIAGNOSIS — C541 Malignant neoplasm of endometrium: Secondary | ICD-10-CM

## 2020-11-15 DIAGNOSIS — J449 Chronic obstructive pulmonary disease, unspecified: Secondary | ICD-10-CM | POA: Insufficient documentation

## 2020-11-15 DIAGNOSIS — Z79899 Other long term (current) drug therapy: Secondary | ICD-10-CM | POA: Insufficient documentation

## 2020-11-15 DIAGNOSIS — E669 Obesity, unspecified: Secondary | ICD-10-CM | POA: Insufficient documentation

## 2020-11-15 DIAGNOSIS — C549 Malignant neoplasm of corpus uteri, unspecified: Secondary | ICD-10-CM

## 2020-11-15 DIAGNOSIS — N83202 Unspecified ovarian cyst, left side: Secondary | ICD-10-CM | POA: Diagnosis not present

## 2020-11-15 DIAGNOSIS — Z87891 Personal history of nicotine dependence: Secondary | ICD-10-CM | POA: Insufficient documentation

## 2020-11-15 DIAGNOSIS — C542 Malignant neoplasm of myometrium: Secondary | ICD-10-CM | POA: Insufficient documentation

## 2020-11-15 DIAGNOSIS — M1611 Unilateral primary osteoarthritis, right hip: Secondary | ICD-10-CM | POA: Insufficient documentation

## 2020-11-15 DIAGNOSIS — Z885 Allergy status to narcotic agent status: Secondary | ICD-10-CM | POA: Insufficient documentation

## 2020-11-15 DIAGNOSIS — N809 Endometriosis, unspecified: Secondary | ICD-10-CM

## 2020-11-15 DIAGNOSIS — N83201 Unspecified ovarian cyst, right side: Secondary | ICD-10-CM | POA: Diagnosis not present

## 2020-11-15 DIAGNOSIS — N800 Endometriosis of uterus: Secondary | ICD-10-CM | POA: Insufficient documentation

## 2020-11-15 DIAGNOSIS — Z7951 Long term (current) use of inhaled steroids: Secondary | ICD-10-CM | POA: Insufficient documentation

## 2020-11-15 DIAGNOSIS — Z6839 Body mass index (BMI) 39.0-39.9, adult: Secondary | ICD-10-CM | POA: Insufficient documentation

## 2020-11-15 HISTORY — DX: Malignant neoplasm of endometrium: C54.1

## 2020-11-15 HISTORY — DX: Gastro-esophageal reflux disease without esophagitis: K21.9

## 2020-11-15 HISTORY — DX: Unspecified asthma, uncomplicated: J45.909

## 2020-11-15 HISTORY — PX: ROBOTIC ASSISTED TOTAL HYSTERECTOMY WITH BILATERAL SALPINGO OOPHERECTOMY: SHX6086

## 2020-11-15 HISTORY — PX: SENTINEL NODE BIOPSY: SHX6608

## 2020-11-15 LAB — ABO/RH: ABO/RH(D): O POS

## 2020-11-15 LAB — TYPE AND SCREEN
ABO/RH(D): O POS
Antibody Screen: NEGATIVE

## 2020-11-15 SURGERY — HYSTERECTOMY, TOTAL, ROBOT-ASSISTED, LAPAROSCOPIC, WITH BILATERAL SALPINGO-OOPHORECTOMY
Anesthesia: General | Site: Abdomen

## 2020-11-15 MED ORDER — GABAPENTIN 300 MG PO CAPS
ORAL_CAPSULE | ORAL | Status: AC
  Filled 2020-11-15: qty 1

## 2020-11-15 MED ORDER — OXYCODONE HCL 5 MG/5ML PO SOLN
5.0000 mg | Freq: Once | ORAL | Status: DC | PRN
Start: 2020-11-15 — End: 2020-11-16

## 2020-11-15 MED ORDER — OXYCODONE HCL 5 MG PO TABS: 5.0000 mg | ORAL_TABLET | Freq: Once | ORAL | Status: DC | PRN

## 2020-11-15 MED ORDER — STERILE WATER FOR INJECTION IJ SOLN
INTRAMUSCULAR | Status: DC | PRN
  Administered 2020-11-15: 5 mL

## 2020-11-15 MED ORDER — CEFAZOLIN SODIUM-DEXTROSE 2-4 GM/100ML-% IV SOLN
INTRAVENOUS | Status: AC
  Filled 2020-11-15: qty 100

## 2020-11-15 MED ORDER — FENTANYL CITRATE (PF) 100 MCG/2ML IJ SOLN
INTRAMUSCULAR | Status: AC
  Filled 2020-11-15: qty 2

## 2020-11-15 MED ORDER — LIDOCAINE 2% (20 MG/ML) 5 ML SYRINGE
INTRAMUSCULAR | Status: DC | PRN
  Administered 2020-11-15: 100 mg via INTRAVENOUS

## 2020-11-15 MED ORDER — PROPOFOL 10 MG/ML IV BOLUS
INTRAVENOUS | Status: DC | PRN
  Administered 2020-11-15: 170 mg via INTRAVENOUS

## 2020-11-15 MED ORDER — SCOPOLAMINE 1 MG/3DAYS TD PT72
1.0000 | MEDICATED_PATCH | TRANSDERMAL | Status: DC
  Administered 2020-11-15: 1.5 mg via TRANSDERMAL

## 2020-11-15 MED ORDER — LACTATED RINGERS IV SOLN: INTRAVENOUS | Status: DC

## 2020-11-15 MED ORDER — ENOXAPARIN SODIUM 40 MG/0.4ML IJ SOSY
PREFILLED_SYRINGE | INTRAMUSCULAR | Status: AC
  Filled 2020-11-15: qty 0.4

## 2020-11-15 MED ORDER — ROCURONIUM BROMIDE 10 MG/ML (PF) SYRINGE
PREFILLED_SYRINGE | INTRAVENOUS | Status: AC
  Filled 2020-11-15: qty 10

## 2020-11-15 MED ORDER — ROCURONIUM BROMIDE 10 MG/ML (PF) SYRINGE
PREFILLED_SYRINGE | INTRAVENOUS | Status: DC | PRN
  Administered 2020-11-15: 20 mg via INTRAVENOUS
  Administered 2020-11-15: 10 mg via INTRAVENOUS
  Administered 2020-11-15: 50 mg via INTRAVENOUS

## 2020-11-15 MED ORDER — AMISULPRIDE (ANTIEMETIC) 5 MG/2ML IV SOLN
10.0000 mg | Freq: Once | INTRAVENOUS | Status: AC | PRN
  Administered 2020-11-15: 10 mg via INTRAVENOUS

## 2020-11-15 MED ORDER — ENOXAPARIN SODIUM 40 MG/0.4ML IJ SOSY
40.0000 mg | PREFILLED_SYRINGE | INTRAMUSCULAR | Status: AC
  Administered 2020-11-15: 40 mg via SUBCUTANEOUS

## 2020-11-15 MED ORDER — ACETAMINOPHEN 10 MG/ML IV SOLN: 1000.0000 mg | Freq: Once | INTRAVENOUS | Status: DC | PRN

## 2020-11-15 MED ORDER — FENTANYL CITRATE (PF) 100 MCG/2ML IJ SOLN
INTRAMUSCULAR | Status: DC | PRN
  Administered 2020-11-15 (×5): 50 ug via INTRAVENOUS

## 2020-11-15 MED ORDER — DEXAMETHASONE SODIUM PHOSPHATE 10 MG/ML IJ SOLN
INTRAMUSCULAR | Status: DC | PRN
  Administered 2020-11-15: 5 mg via INTRAVENOUS

## 2020-11-15 MED ORDER — ONDANSETRON HCL 4 MG/2ML IJ SOLN: 4.0000 mg | Freq: Once | INTRAMUSCULAR | Status: DC | PRN

## 2020-11-15 MED ORDER — PHENYLEPHRINE 40 MCG/ML (10ML) SYRINGE FOR IV PUSH (FOR BLOOD PRESSURE SUPPORT)
PREFILLED_SYRINGE | INTRAVENOUS | Status: DC | PRN
  Administered 2020-11-15: 80 ug via INTRAVENOUS

## 2020-11-15 MED ORDER — GABAPENTIN 300 MG PO CAPS
300.0000 mg | ORAL_CAPSULE | ORAL | Status: AC
  Administered 2020-11-15: 300 mg via ORAL

## 2020-11-15 MED ORDER — CELECOXIB 200 MG PO CAPS
400.0000 mg | ORAL_CAPSULE | ORAL | Status: AC
  Administered 2020-11-15: 400 mg via ORAL

## 2020-11-15 MED ORDER — DEXAMETHASONE SODIUM PHOSPHATE 10 MG/ML IJ SOLN
INTRAMUSCULAR | Status: AC
  Filled 2020-11-15: qty 1

## 2020-11-15 MED ORDER — HYDROMORPHONE HCL 1 MG/ML IJ SOLN: 0.2500 mg | INTRAMUSCULAR | Status: DC | PRN

## 2020-11-15 MED ORDER — WHITE PETROLATUM EX OINT
TOPICAL_OINTMENT | CUTANEOUS | Status: AC
  Filled 2020-11-15: qty 5

## 2020-11-15 MED ORDER — LIDOCAINE 2% (20 MG/ML) 5 ML SYRINGE
INTRAMUSCULAR | Status: DC | PRN
  Administered 2020-11-15: 1.5 mg/kg/h via INTRAVENOUS

## 2020-11-15 MED ORDER — DEXMEDETOMIDINE (PRECEDEX) IN NS 20 MCG/5ML (4 MCG/ML) IV SYRINGE
PREFILLED_SYRINGE | INTRAVENOUS | Status: AC
  Filled 2020-11-15: qty 5

## 2020-11-15 MED ORDER — CELECOXIB 200 MG PO CAPS
ORAL_CAPSULE | ORAL | Status: AC
  Filled 2020-11-15: qty 2

## 2020-11-15 MED ORDER — SODIUM CHLORIDE 0.9 % IR SOLN
Status: DC | PRN
  Administered 2020-11-15: 1000 mL

## 2020-11-15 MED ORDER — SCOPOLAMINE 1 MG/3DAYS TD PT72
MEDICATED_PATCH | TRANSDERMAL | Status: AC
  Filled 2020-11-15: qty 1

## 2020-11-15 MED ORDER — DEXAMETHASONE SODIUM PHOSPHATE 10 MG/ML IJ SOLN: 4.0000 mg | INTRAMUSCULAR | Status: DC

## 2020-11-15 MED ORDER — ACETAMINOPHEN 500 MG PO TABS
ORAL_TABLET | ORAL | Status: AC
  Filled 2020-11-15: qty 2

## 2020-11-15 MED ORDER — KETAMINE HCL 50 MG/5ML IJ SOSY
PREFILLED_SYRINGE | INTRAMUSCULAR | Status: AC
  Filled 2020-11-15: qty 5

## 2020-11-15 MED ORDER — AMISULPRIDE (ANTIEMETIC) 5 MG/2ML IV SOLN
INTRAVENOUS | Status: AC
  Filled 2020-11-15: qty 4

## 2020-11-15 MED ORDER — BUPIVACAINE LIPOSOME 1.3 % IJ SUSP
INTRAMUSCULAR | Status: DC | PRN
  Administered 2020-11-15: 20 mL

## 2020-11-15 MED ORDER — LIDOCAINE HCL (PF) 2 % IJ SOLN
INTRAMUSCULAR | Status: AC
  Filled 2020-11-15: qty 15

## 2020-11-15 MED ORDER — MIDAZOLAM HCL 2 MG/2ML IJ SOLN
INTRAMUSCULAR | Status: DC | PRN
  Administered 2020-11-15: 2 mg via INTRAVENOUS

## 2020-11-15 MED ORDER — DEXMEDETOMIDINE (PRECEDEX) IN NS 20 MCG/5ML (4 MCG/ML) IV SYRINGE
PREFILLED_SYRINGE | INTRAVENOUS | Status: DC | PRN
  Administered 2020-11-15: 4 ug via INTRAVENOUS
  Administered 2020-11-15: 8 ug via INTRAVENOUS
  Administered 2020-11-15: 4 ug via INTRAVENOUS

## 2020-11-15 MED ORDER — ACETAMINOPHEN 500 MG PO TABS
1000.0000 mg | ORAL_TABLET | ORAL | Status: AC
  Administered 2020-11-15: 1000 mg via ORAL

## 2020-11-15 MED ORDER — SUGAMMADEX SODIUM 200 MG/2ML IV SOLN
INTRAVENOUS | Status: DC | PRN
  Administered 2020-11-15: 200 mg via INTRAVENOUS

## 2020-11-15 MED ORDER — LIDOCAINE HCL (PF) 2 % IJ SOLN
INTRAMUSCULAR | Status: AC
  Filled 2020-11-15: qty 5

## 2020-11-15 MED ORDER — CEFAZOLIN SODIUM-DEXTROSE 2-4 GM/100ML-% IV SOLN
2.0000 g | INTRAVENOUS | Status: AC
  Administered 2020-11-15: 2 g via INTRAVENOUS

## 2020-11-15 MED ORDER — ONDANSETRON HCL 4 MG/2ML IJ SOLN
INTRAMUSCULAR | Status: DC | PRN
  Administered 2020-11-15: 4 mg via INTRAVENOUS

## 2020-11-15 MED ORDER — POVIDONE-IODINE 10 % EX SWAB
2.0000 | Freq: Once | CUTANEOUS | Status: DC
Start: 2020-11-15 — End: 2020-11-16

## 2020-11-15 MED ORDER — PHENYLEPHRINE 40 MCG/ML (10ML) SYRINGE FOR IV PUSH (FOR BLOOD PRESSURE SUPPORT)
PREFILLED_SYRINGE | INTRAVENOUS | Status: AC
  Filled 2020-11-15: qty 10

## 2020-11-15 MED ORDER — MIDAZOLAM HCL 2 MG/2ML IJ SOLN
INTRAMUSCULAR | Status: AC
  Filled 2020-11-15: qty 2

## 2020-11-15 MED ORDER — BUPIVACAINE HCL 0.25 % IJ SOLN
INTRAMUSCULAR | Status: DC | PRN
  Administered 2020-11-15: 12 mL
  Administered 2020-11-15: 18 mL

## 2020-11-15 MED ORDER — ONDANSETRON HCL 4 MG/2ML IJ SOLN
INTRAMUSCULAR | Status: AC
  Filled 2020-11-15: qty 2

## 2020-11-15 SURGICAL SUPPLY — 62 items
ADH SKN CLS APL DERMABOND .7 (GAUZE/BANDAGES/DRESSINGS) ×1
BAG LAPAROSCOPIC 12 15 PORT 16 (BASKET) ×1 IMPLANT
BAG RETRIEVAL 12/15 (BASKET) ×2
BLADE SURG 10 STRL SS (BLADE) ×1 IMPLANT
COVER BACK TABLE 60X90IN (DRAPES) ×2 IMPLANT
COVER TIP SHEARS 8 DVNC (MISCELLANEOUS) ×1 IMPLANT
COVER TIP SHEARS 8MM DA VINCI (MISCELLANEOUS) ×2
DERMABOND ADVANCED (GAUZE/BANDAGES/DRESSINGS) ×1
DERMABOND ADVANCED .7 DNX12 (GAUZE/BANDAGES/DRESSINGS) ×1 IMPLANT
DRAPE ARM DVNC X/XI (DISPOSABLE) ×4 IMPLANT
DRAPE COLUMN DVNC XI (DISPOSABLE) ×1 IMPLANT
DRAPE DA VINCI XI ARM (DISPOSABLE) ×8
DRAPE DA VINCI XI COLUMN (DISPOSABLE) ×2
DRAPE SHEET LG 3/4 BI-LAMINATE (DRAPES) ×2 IMPLANT
DRAPE SURG IRRIG POUCH 19X23 (DRAPES) ×2 IMPLANT
DRSG OPSITE POSTOP 3X4 (GAUZE/BANDAGES/DRESSINGS) ×1 IMPLANT
ELECT REM PT RETURN 9FT ADLT (ELECTROSURGICAL) ×2
ELECTRODE REM PT RTRN 9FT ADLT (ELECTROSURGICAL) ×1 IMPLANT
GAUZE 4X4 16PLY ~~LOC~~+RFID DBL (SPONGE) ×5 IMPLANT
GLOVE SURG ENC MOIS LTX SZ6 (GLOVE) ×8 IMPLANT
GLOVE SURG ENC MOIS LTX SZ6.5 (GLOVE) ×2 IMPLANT
GLOVE SURG UNDER POLY LF SZ7 (GLOVE) ×12 IMPLANT
HOLDER FOLEY CATH W/STRAP (MISCELLANEOUS) ×2 IMPLANT
IRRIG SUCT STRYKERFLOW 2 WTIP (MISCELLANEOUS) ×2
IRRIGATION SUCT STRKRFLW 2 WTP (MISCELLANEOUS) ×1 IMPLANT
IV NS 1000ML (IV SOLUTION) ×2
IV NS 1000ML BAXH (IV SOLUTION) IMPLANT
KIT PROCEDURE DA VINCI SI (MISCELLANEOUS) ×2
KIT PROCEDURE DVNC SI (MISCELLANEOUS) IMPLANT
KIT TURNOVER CYSTO (KITS) ×2 IMPLANT
LEGGING LITHOTOMY PAIR STRL (DRAPES) ×2 IMPLANT
MANIPULATOR ADVINCU DEL 3.5 PL (MISCELLANEOUS) ×2 IMPLANT
MANIPULATOR UTERINE 4.5 ZUMI (MISCELLANEOUS) ×2 IMPLANT
NDL SPNL 18GX3.5 QUINCKE PK (NEEDLE) IMPLANT
NEEDLE HYPO 22GX1.5 SAFETY (NEEDLE) ×2 IMPLANT
NEEDLE SPNL 18GX3.5 QUINCKE PK (NEEDLE) ×2 IMPLANT
NS IRRIG 500ML POUR BTL (IV SOLUTION) ×2 IMPLANT
OBTURATOR OPTICAL STANDARD 8MM (TROCAR) ×2
OBTURATOR OPTICAL STND 8 DVNC (TROCAR) ×1
OBTURATOR OPTICALSTD 8 DVNC (TROCAR) ×1 IMPLANT
PACK ROBOT GYN CUSTOM WL (TRAY / TRAY PROCEDURE) ×2 IMPLANT
PACK ROBOTIC GOWN (GOWN DISPOSABLE) ×2 IMPLANT
PAD POSITIONING PINK XL (MISCELLANEOUS) ×2 IMPLANT
PENCIL BUTTON HOLSTER BLD 10FT (ELECTRODE) ×2 IMPLANT
PENCIL SMOKE EVACUATOR (MISCELLANEOUS) ×1 IMPLANT
PORT ACCESS TROCAR AIRSEAL 12 (TROCAR) ×1 IMPLANT
PORT ACCESS TROCAR AIRSEAL 5M (TROCAR) ×1
SEAL CANN UNIV 5-8 DVNC XI (MISCELLANEOUS) ×3 IMPLANT
SEAL XI 5MM-8MM UNIVERSAL (MISCELLANEOUS) ×6
SET TRI-LUMEN FLTR TB AIRSEAL (TUBING) ×2 IMPLANT
SPONGE T-LAP 18X18 ~~LOC~~+RFID (SPONGE) ×2 IMPLANT
SUT PDS AB 0 CTX 60 (SUTURE) ×2 IMPLANT
SUT STRATAFIX SPIRAL PDS+ 0 30 (SUTURE) ×1 IMPLANT
SUT VIC AB 3-0 SH 27 (SUTURE) ×2
SUT VIC AB 3-0 SH 27X BRD (SUTURE) IMPLANT
SUT VIC AB 4-0 PS2 18 (SUTURE) ×5 IMPLANT
SYR 10ML LL (SYRINGE) ×1 IMPLANT
TRAY FOL W/BAG SLVR 16FR STRL (SET/KITS/TRAYS/PACK) ×1 IMPLANT
TRAY FOLEY W/BAG SLVR 16FR LF (SET/KITS/TRAYS/PACK) ×2
TUBE CONNECTING 12X1/4 (SUCTIONS) ×2 IMPLANT
UNDERPAD 30X36 HEAVY ABSORB (UNDERPADS AND DIAPERS) ×2 IMPLANT
YANKAUER SUCT BULB TIP NO VENT (SUCTIONS) ×1 IMPLANT

## 2020-11-15 NOTE — Discharge Instructions (Addendum)
11/15/2020  Return to work: 4 weeks if applicable  You will have a white honeycomb dressing over your larger incision. This dressing can be removed 5 days after surgery and you do not need to reapply a new dressing. Once you remove the dressing, you will notice that you have the surgical glue (dermabond) on the incision and this will peel off on its own. You can get this dressing wet in the shower the days after surgery prior to removal on the 5th day.   Do not start your aspirin for one week after surgery.  Activity: 1. Be up and out of the bed during the day.  Take a nap if needed.  You may walk up steps but be careful and use the hand rail.  Stair climbing will tire you more than you think, you may need to stop part way and rest.   2. No lifting or straining over 10 lbs, pushing, pulling, straining for 4-6 weeks.  3. No driving for 1 week(s).  Do not drive if you are taking narcotic pain medicine. You need to make sure your reaction time has returned and you can brake safely.  4. Shower daily.  Use your regular soap to bathe and when finished pat your incision dry; don't rub.  No tub baths until cleared by your surgeon.   5. No sexual activity and nothing in the vagina for 8 weeks.  6. You may experience a small amount of clear drainage from your incisions, which is normal.  If the drainage persists or increases, please call the office.  7. You may experience vaginal spotting after surgery or around the 6-8 week mark from surgery when the stitches at the top of the vagina begin to dissolve.  The spotting is normal but if you experience heavy bleeding, call our office.  8. Take Tylenol or ibuprofen first for pain and only use narcotic pain medication for severe pain not relieved by the Tylenol or Ibuprofen.  Monitor your Tylenol intake to a max of 4,000 mg.   Diet: 1. Low sodium Heart Healthy Diet is recommended.  2. It is safe to use a laxative, such as Miralax or Colace, if you have  difficulty moving your bowels. You can take Sennakot at bedtime every evening to keep bowel movements regular and to prevent constipation.    Wound Care: 1. Keep clean and dry.  Shower daily.  Reasons to call the Doctor: Fever - Oral temperature greater than 100.4 degrees Fahrenheit Foul-smelling vaginal discharge Difficulty urinating Nausea and vomiting Increased pain at the site of the incision that is unrelieved with pain medicine. Difficulty breathing with or without chest pain New calf pain especially if only on one side Sudden, continuing increased vaginal bleeding with or without clots.   Contacts: For questions or concerns you should contact:  Dr. Everitt Amber at [phone]  Joylene John, NP at [phone]  After Hours: call [phone] and have the GYN Oncologist paged/contacted   Post Anesthesia Home Care Instructions  Activity: Get plenty of rest for the remainder of the day. A responsible adult should stay with you for 24 hours following the procedure.  For the next 24 hours, DO NOT: -Drive a car -Paediatric nurse -Drink alcoholic beverages -Take any medication unless instructed by your physician -Make any legal decisions or sign important papers.  Meals: Start with liquid foods such as gelatin or soup. Progress to regular foods as tolerated. Avoid greasy, spicy, heavy foods. If nausea and/or vomiting occur, drink only  clear liquids until the nausea and/or vomiting subsides. Call your physician if vomiting continues.  Special Instructions/Symptoms: Your throat may feel dry or sore from the anesthesia or the breathing tube placed in your throat during surgery. If this causes discomfort, gargle with warm salt water. The discomfort should disappear within 24 hours.  If you had a scopolamine patch placed behind your ear for the management of post- operative nausea and/or vomiting:  1. The medication in the patch is effective for 72 hours, after which it should  be removed.  Wrap patch in a tissue and discard in the trash. Wash hands thoroughly with soap and water. 2. You may remove the patch earlier than 72 hours if you experience unpleasant side effects which may include dry mouth, dizziness or visual disturbances. 3. Avoid touching the patch. Wash your hands with soap and water after contact with the patch.   Information for Discharge Teaching: EXPAREL (bupivacaine liposome injectable suspension)   Your surgeon gave you EXPAREL(bupivacaine) in your surgical incision to help control your pain after surgery.  EXPAREL is a local anesthetic that provides pain relief by numbing the tissue around the surgical site. EXPAREL is designed to release pain medication over time and can control pain for up to 72 hours. Depending on how you respond to EXPAREL, you may require less pain medication during your recovery.  Possible side effects: Temporary loss of sensation or ability to move in the area where bupivacaine was injected. Nausea, vomiting, constipation Rarely, numbness and tingling in your mouth or lips, lightheadedness, or anxiety may occur. Call your doctor right away if you think you may be experiencing any of these sensations, or if you have other questions regarding possible side effects.  Follow all other discharge instructions given to you by your surgeon or nurse. Eat a healthy diet and drink plenty of water or other fluids.  If you return to the hospital for any reason within 96 hours following the administration of EXPAREL, please inform your health care providers.

## 2020-11-15 NOTE — Op Note (Signed)
OPERATIVE NOTE 11/15/20  Surgeon: Donaciano Eva   Assistants:  Joylene John (an NP assistant was necessary for tissue manipulation, management of robotic instrumentation, retraction and positioning due to the complexity of the case and hospital policies).   Anesthesia: Epidural anesthesia  ASA Class: 3   Pre-operative Diagnosis: endometrial cancer grade 1  Post-operative Diagnosis: same,   Operation: Robotic-assisted laparoscopic total hysterectomy >250gm, with bilateral salpingoophorectomy, SLN biopsy   Surgeon: Donaciano Eva  Assistant Surgeon: Joylene John, NP  Anesthesia: GET  Urine Output: 60cc  Operative Findings:  : 15cm bulky uterus measuring 850gms, bilateral mapping, suspicious lymph nodes bilaterally (frozen suggestive of metastases), no gross residual lymph node bulk in non-SLN's. Normal omentum.   Estimated Blood Loss:   100cc       Total IV Fluids: 800 ml         Specimens: uterus, cervix, bilateral tubes and ovaries, right external iliac SLN, left obturator SLN         Complications:  None; patient tolerated the procedure well.         Disposition: PACU - hemodynamically stable.  Procedure Details  The patient was seen in the Holding Room. The risks, benefits, complications, treatment options, and expected outcomes were discussed with the patient.  The patient concurred with the proposed plan, giving informed consent.  The site of surgery properly noted/marked. The patient was identified as [facility] and the procedure verified as a Robotic-assisted hysterectomy with bilateral salpingo oophorectomy with SLN biopsy. A Time Out was held and the above information confirmed.  After induction of anesthesia, the patient was draped and prepped in the usual sterile manner. Pt was placed in supine position after anesthesia and draped and prepped in the usual sterile manner. The abdominal drape was placed after the CholoraPrep had been allowed to dry  for 3 minutes.  Her arms were tucked to her side with all appropriate precautions.  The shoulders were stabilized with padded shoulder blocks applied to the acromium processes.  The patient was placed in the semi-lithotomy position in [place].  The perineum was prepped with Betadine. The patient was then prepped. Foley catheter was placed.  A sterile speculum was placed in the vagina.  The cervix was grasped with a single-tooth tenaculum. '2mg'$  total of ICG was injected into the cervical stroma at 2 and 9 o'clock with 1cc injected at a 1cm and 72m depth (concentration 0.'5mg'$ /ml) in all locations. The cervix was dilated with PKennon Roundsdilators.  The ZUMI uterine manipulator with a medium colpotomizer ring was placed without difficulty.  A pneum occluder balloon was placed over the manipulator.  OG tube placement was confirmed and to suction.   Next, a 5 mm skin incision was made 1 cm below the subcostal margin in the midclavicular line.  The 5 mm Optiview port and scope was used for direct entry.  Opening pressure was under 10 mm CO2.  The abdomen was insufflated and the findings were noted as above.   At this point and all points during the procedure, the patient's intra-abdominal pressure did not exceed 15 mmHg. Next, a 10 mm skin incision was made above the umbilicus and a right and left port was placed about 10 cm lateral to the robot port on the right and left side.  A fourth arm was placed in the left lower quadrant 2 cm above and superior and medial to the anterior superior iliac spine.  All ports were placed under direct visualization.  The patient  was placed in steep Trendelenburg.  Bowel was folded away into the upper abdomen.  The robot was docked in the normal manner.  The right and left peritoneum were opened parallel to the IP ligament to open the retroperitoneal spaces bilaterally. The SLN mapping was performed in bilateral pelvic basins. The para rectal and paravesical spaces were opened up  entirely with careful dissection below the level of the ureters bilaterally and to the depth of the uterine artery origin in order to skeletonize the uterine "web" and ensure visualization of all parametrial channels. The para-aortic basins were carefully exposed and evaluated for isolated para-aortic SLN's. Lymphatic channels were identified travelling to the following visualized sentinel lymph node's: right external iliac and left obturator. These SLN's were separated from their surrounding lymphatic tissue, removed and sent for frozen section pathology. Because the left external iliac SLN was grossly suspicious, it was sent for frozen section which revealed findings concerning for metastatic disease.   The hysterectomy was started after the round ligament on the right side was incised and the retroperitoneum was entered and the pararectal space was developed.  The ureter was noted to be on the medial leaf of the broad ligament.  The peritoneum above the ureter was incised and stretched and the infundibulopelvic ligament was skeletonized, cauterized and cut.  The posterior peritoneum was taken down to the level of the KOH ring.  The anterior peritoneum was also taken down.  The bladder flap was created to the level of the KOH ring.  The uterine artery on the right side was skeletonized, cauterized and cut in the normal manner.  A similar procedure was performed on the left.  The colpotomy was made and the uterus, cervix, bilateral ovaries and tubes were amputated and delivered through the vagina.  Pedicles were inspected and excellent hemostasis was achieved.    The colpotomy at the vaginal cuff was closed with Vicryl on a CT1 needle in figure of 8's at the corner and running stratafix suture in the in the mid cuff in a running manner.  Irrigation was used and excellent hemostasis was achieved.  At this point in the procedure was completed.  Robotic instruments were removed under direct visulaization.  The  robot was undocked.   A 6cm supraumbilical vertical incision was made with the scalpel. The subcutaneous skin was opened with the bovie. The fascia was opened with the bovie vertically and the rectus muscles were dissected off of the fascia inferiorally and superiorally. The peritoneum was opened sharply in the midline. The peritoneal incision was extended. The uterine specimen in the endocatch bag was retrieved through the incision. The fascia was closed with running  looped PDS. The subcutaneous fat was closed with 3-0 vicryl. 20cc of exparel mixed with 20cc of marcaine was infiltrated into the incision. The incision was closed at the skin with monocryl and dermabond.   The 10 mm ports were closed with Vicryl on a UR-5 needle and the fascia was closed with 0 Vicryl on a UR-5 needle.  The skin was closed with 4-0 Vicryl in a subcuticular manner.  Dermabond was applied.  Sponge, lap and needle counts correct x 2.  The patient was taken to the recovery room in stable condition.  The vagina was swabbed with minimal bleeding noted.   All instrument and needle counts were correct x  3.   The patient was transferred to the recovery room in a stable condition.  Donaciano Eva, MD

## 2020-11-15 NOTE — Interval H&P Note (Signed)
History and Physical Interval Note:  11/15/2020 12:45 PM  Michele Romero  has presented today for surgery, with the diagnosis of ENDOMETRIAL CANCER.  The various methods of treatment have been discussed with the patient and family. After consideration of risks, benefits and other options for treatment, the patient has consented to  Procedure(s): XI ROBOTIC ASSISTED TOTAL HYSTERECTOMY GREATER THAN TWO HUNDRED AND FIFTY GRAMS WITH BILATERAL SALPINGO OOPHORECTOMY, MINI LAPAROTOMY (N/A) SENTINEL NODE BIOPSY (N/A) as a surgical intervention.  The patient's history has been reviewed, patient examined, no change in status, stable for surgery.  I have reviewed the patient's chart and labs. CT showed a lipoma of the colon (asymptomatic) which was present on prior colonoscopy.  Questions were answered to the patient's satisfaction.     Thereasa Solo

## 2020-11-15 NOTE — Transfer of Care (Signed)
Immediate Anesthesia Transfer of Care Note  Patient: [facility]  Procedure(s) Performed: Procedure(s) (LRB): XI ROBOTIC ASSISTED TOTAL HYSTERECTOMY GREATER THAN TWO HUNDRED AND FIFTY GRAMS WITH BILATERAL SALPINGO OOPHORECTOMY, MINI LAPAROTOMY (N/A) SENTINEL NODE BIOPSY (N/A)  Patient Location: PACU  Anesthesia Type: General  Level of Consciousness: awake, oriented, sedated and patient cooperative  Airway & Oxygen Therapy: Patient Spontanous Breathing and Patient connected to face mask oxygen  Post-op Assessment: Report given to PACU RN and Post -op Vital signs reviewed and stable  Post vital signs: Reviewed and stable  Complications: No apparent anesthesia complications Last Vitals:  Vitals Value Taken Time  BP 160/89 11/15/20 1622  Temp    Pulse 90 11/15/20 1627  Resp 20 11/15/20 1627  SpO2 100 % 11/15/20 1627  Vitals shown include unvalidated device data.  Last Pain:  Vitals:   11/15/20 1223  TempSrc: Oral  PainSc: 0-No pain      Patients Stated Pain Goal: 5 ([id number])  Complications: No notable events documented.

## 2020-11-15 NOTE — Anesthesia Postprocedure Evaluation (Signed)
Anesthesia Post Note  Patient: Michele Romero  Procedure(s) Performed: XI ROBOTIC ASSISTED TOTAL HYSTERECTOMY GREATER THAN TWO HUNDRED AND FIFTY GRAMS WITH BILATERAL SALPINGO OOPHORECTOMY, MINI LAPAROTOMY (Abdomen) SENTINEL NODE BIOPSY (Abdomen)     Patient location during evaluation: PACU Anesthesia Type: General Level of consciousness: awake and alert Pain management: pain level controlled Vital Signs Assessment: post-procedure vital signs reviewed and stable Respiratory status: spontaneous breathing, nonlabored ventilation, respiratory function stable and patient connected to nasal cannula oxygen Cardiovascular status: blood pressure returned to baseline and stable Postop Assessment: no apparent nausea or vomiting Anesthetic complications: no   No notable events documented.  Last Vitals:  Vitals:   11/15/20 1752 11/15/20 1800  BP:    Pulse: 78 64  Resp: 12 12  Temp:    SpO2: 96% 99%    Last Pain:  Vitals:   11/15/20 1223  TempSrc: Oral  PainSc: 0-No pain                 Barnet Glasgow

## 2020-11-15 NOTE — Anesthesia Procedure Notes (Signed)
Procedure Name: Intubation Date/Time: 11/15/2020 1:33 PM Performed by: Suan Halter, CRNA Pre-anesthesia Checklist: Patient identified, Emergency Drugs available, Suction available and Patient being monitored Patient Re-evaluated:Patient Re-evaluated prior to induction Oxygen Delivery Method: Circle system utilized Preoxygenation: Pre-oxygenation with 100% oxygen Induction Type: IV induction Ventilation: Mask ventilation without difficulty Laryngoscope Size: Mac and 3 Grade View: Grade I Tube type: Oral Tube size: 7.0 mm Number of attempts: 1 Airway Equipment and Method: Stylet and Oral airway Placement Confirmation: ETT inserted through vocal cords under direct vision, positive ETCO2 and breath sounds checked- equal and bilateral Secured at: 22 cm Tube secured with: Tape Dental Injury: Teeth and Oropharynx as per pre-operative assessment

## 2020-11-16 ENCOUNTER — Encounter (HOSPITAL_BASED_OUTPATIENT_CLINIC_OR_DEPARTMENT_OTHER): Payer: Self-pay | Admitting: Gynecologic Oncology

## 2020-11-16 ENCOUNTER — Telehealth: Payer: Self-pay

## 2020-11-16 NOTE — Telephone Encounter (Signed)
Spoke with Michele Romero. She states she is eating, drinking and urinating well. She has not had a BM. Not passing gas. She will begin taking senokot-S 2 tabs 8 oz of water bid.  If no BM by Sunday 11-18-20, she is to add 1 capful of miralax bid. Encouraged her to drink plenty of water. She denies fever or chills. Incisions are dry and intact. Told her she can take the honeycomb dressing off on Tuesday 11-20-20 and leave incision open to the air. She is experiencing some heartburn which began ~3 hrs after ibuprofen 800 mg.  Told her to eat something heavier like ice cream or yogurt.  Her daughter is going to get some Tums.  Told her she could take OTC Prilosec 20 daily while on the ibuprofen. She should not lay down for 30 minutes after taking ibuprofen.  Her pain is controlled with Tramadol and ibuprofen,tylenol.  Instructed to call office with any fever, chills, purulent drainage, uncontrolled pain or any other questions or concerns. Patient verbalizes understanding.  Pt aware of post op appointments as well as the office number [phone] and after hours number [phone] to call if she has any questions or concerns

## 2020-11-22 LAB — SURGICAL PATHOLOGY

## 2020-11-28 ENCOUNTER — Encounter (HOSPITAL_COMMUNITY): Payer: Self-pay | Admitting: Gynecologic Oncology

## 2020-12-07 ENCOUNTER — Other Ambulatory Visit: Payer: Self-pay

## 2020-12-07 ENCOUNTER — Encounter: Payer: Self-pay | Admitting: Oncology

## 2020-12-07 ENCOUNTER — Inpatient Hospital Stay: Payer: 59 | Attending: Gynecologic Oncology | Admitting: Gynecologic Oncology

## 2020-12-07 ENCOUNTER — Encounter: Payer: Self-pay | Admitting: Gynecologic Oncology

## 2020-12-07 VITALS — BP 145/85 | HR 79 | Temp 98.2°F | Resp 18 | Ht 62.0 in | Wt 216.0 lb

## 2020-12-07 DIAGNOSIS — Z90722 Acquired absence of ovaries, bilateral: Secondary | ICD-10-CM | POA: Insufficient documentation

## 2020-12-07 DIAGNOSIS — C541 Malignant neoplasm of endometrium: Secondary | ICD-10-CM

## 2020-12-07 DIAGNOSIS — T8149XA Infection following a procedure, other surgical site, initial encounter: Secondary | ICD-10-CM | POA: Diagnosis not present

## 2020-12-07 DIAGNOSIS — Z90710 Acquired absence of both cervix and uterus: Secondary | ICD-10-CM | POA: Insufficient documentation

## 2020-12-07 DIAGNOSIS — Z7189 Other specified counseling: Secondary | ICD-10-CM

## 2020-12-07 DIAGNOSIS — C55 Malignant neoplasm of uterus, part unspecified: Secondary | ICD-10-CM | POA: Insufficient documentation

## 2020-12-07 DIAGNOSIS — C549 Malignant neoplasm of corpus uteri, unspecified: Secondary | ICD-10-CM

## 2020-12-07 MED ORDER — AMOXICILLIN-POT CLAVULANATE 875-125 MG PO TABS: 1.0000 | ORAL_TABLET | Freq: Two times a day (BID) | ORAL | 0 refills | Status: DC

## 2020-12-07 NOTE — Progress Notes (Signed)
Follow-up Note: Gyn-Onc  Consult was requested by Dr. Elonda Husky for the evaluation of Michele Romero 60 y.o. female  CC:  Chief Complaint  Patient presents with   Mixed Mullerian uterine sarcoma [facility])    Assessment/Plan:  Michele Romero  is a 60 y.o.  year old P2 with stage IIIC1 carcinosarcoma of the endometrium, MMR normal/MSI stable. S/p hysterectomy/bso/staging on 11/15/20.   I recommend 6 cycles of adjuvant chemotherapy with platinum and taxane and vaginal brachytherapy. I described these treatments to the patient.  She is in agreement with the treatment plan.  She would like to have chemotherapy at [facility].  She will have vaginal brachytherapy here at [facility] current health cancer center.  After completing therapy she will return to follow-up with my partner Dr. Berline Lopes as part of her regular surveillance.  The patient was counseled regarding the high risk for recurrence with this advanced stage, high risk pathology.  I am recommending 10 days of treatment with Augmentin for her wound cellulitis.   HPI: Ms Michele Romero is a 60 year old P2 who was seen in consultation at the request of Dr Elonda Husky for evaluation and treatment of grade 3 carcinosarcoma endometrial cancer.   Her symptoms began in May, 2022 with postmenopausal bleeding. She had not been seen by her gynecologist, Dr. Glo Herring, for more than 10 years and therefore needed to reestablish care with his practice. She saw  [facility]  for a new patient visit fairly promptly on September 13, 2020.  Work-up of symptoms included a Pap smear, ultrasound, office biopsy and eventually D&C. Pap smear in 6-22 was benign and negative for high-risk HPV. Transvaginal US on 09/14/2020 showed an enlarged uterus measuring 11.3 x 8.4 x 7.5 cm with 2 fibroids one in the anterior right uterus measuring 6 cm and the other in the left uterus measuring 4 cm.  The endometrium was thickened to 19 mm.  The ovaries were not  visualized. Endometrial sampling with an office [place] biopsy on 09/13/2020 showed predominantly blood with no endometrium identified. Therefore she was taken to the operating room for definitive diagnosis with a hysteroscopy D&C on 10/10/2020 which revealed carcinosarcoma  Interval Hx:  On 11/15/2020 she underwent robotic assisted total hysterectomy for uterus greater than 250 g, bilateral salpingo-oophorectomy, sentinel lymph node biopsy bilateral, mini laparotomy for specimen delivery. Intraoperative findings were significant for a 15 cm bulky uterus measuring 850 g, bilateral sentinel lymph node mapping, suspicious sentinel lymph node on both sides with the right external iliac lymph node evaluated microscopically on frozen section confirming micro metastatic tumor.  No gross residual disease after removal of the sentinel nodes and no gross residual disease in the nonsentinel lymph nodes.  Normal-appearing omentum and upper abdomen. Surgery was uncomplicated though challenging due to the size of the uterus.  Final pathology revealed a carcinosarcoma measuring 8.5 cm arising in the endometrium.  It invaded 5 of 32 mm (less than 50%) of the myometrium with lymphovascular space invasion present.  The ovaries were negative for malignancy as with the cervix.  The right external iliac sentinel lymph node was positive for a micrometastasis measuring between 0.2 and 2 mm.  The left obturator sentinel lymph node was negative.  MMR testing revealed that MMR proteins were preserved and the tumor was MSI stable.  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  Since surgery she had done fairly well with the exception of some developing erythema in the lower aspect of her mini laparotomy incision.   Current Meds:  Outpatient Encounter  Medications as of 12/07/2020  Medication Sig   albuterol (VENTOLIN HFA) 108 (90 Base) MCG/ACT inhaler Inhale 2 puffs into the lungs every 6 (six) hours as needed for wheezing or shortness of breath.   budesonide-formoterol (SYMBICORT) 80-4.5 MCG/ACT inhaler Inhale 2 puffs into the lungs 2 (two) times daily.   calcium carbonate (TUMS - DOSED IN MG ELEMENTAL CALCIUM) 500 MG chewable tablet Chew 1 tablet by mouth as needed for indigestion or heartburn.   diphenhydrAMINE (BENADRYL) 25 MG tablet Take 25 mg by mouth daily as needed for allergies.   ibuprofen (ADVIL) 800 MG tablet Take 1 tablet (800 mg total) by mouth every 8 (eight) hours as needed for moderate pain. For AFTER surgery only   Multiple Vitamin (MULTIVITAMIN WITH MINERALS) TABS tablet Take 1 tablet by mouth daily.   ondansetron (ZOFRAN ODT) 8 MG disintegrating tablet Take 1 tablet (8 mg total) by mouth every 8 (eight) hours as needed for nausea or vomiting.   acetaminophen (TYLENOL) 500 MG tablet Take 1,000 mg by mouth every 6 (six) hours as needed for moderate pain or headache. (Patient not taking: Reported on 12/07/2020)   senna-docusate (SENOKOT-S) 8.6-50 MG tablet Take 2 tablets by mouth at bedtime. For AFTER surgery, do not take if having diarrhea (Patient not taking: Reported on 12/07/2020)   traMADol (ULTRAM) 50 MG tablet Take 1 tablet (50 mg total) by mouth every 6 (six) hours as needed for severe pain. For AFTER surgery only, do not take and drive (Patient not taking: Reported on 12/07/2020)   vitamin B-12 (CYANOCOBALAMIN) 500 MCG tablet Take 500 mcg by mouth daily. (Patient not taking: Reported on 12/07/2020)   No facility-administered encounter medications on file as of 12/07/2020.    Allergy:  Allergies  Allergen Reactions   Codeine Nausea And Vomiting and Rash    Social Hx:   Social History   Socioeconomic History   Marital status: Single    Spouse name: Not on file   Number of children: 2   Years of education: Not on  file   Highest education level: High school graduate  Occupational History   Not on file  Tobacco Use   Smoking status: Former    Packs/day: 0.50    Years: 30.00    Pack years: 15.00    Types: Cigarettes    Quit date: 01/01/2020    Years since quitting: 0.9    Passive exposure: Never   Smokeless tobacco: Never  Vaping Use   Vaping Use: Never used  Substance and Sexual Activity   Alcohol use: No   Drug use: Yes    Frequency: 6.0 times per week    Types: Marijuana    Comment: every now and then   Sexual activity: Not Currently    Birth control/protection: Surgical    Comment: tubal  Other Topics Concern   Not on file  Social History Narrative   Lives with daughter and 1 grandbaby   Son is in [place]      Enjoys: sitting outside, music, tv      Diet: eats all food groups   Caffeine: soda and tea: 2-3 cups daily   Water: 2-3 16 oz bottles      Wears seat belt    Does not use phone while driving   Smoke detectors  at home    No weapons    Social Determinants of Health   Financial Resource Strain: Not on file  Food Insecurity: Not on file  Transportation Needs: Not on file  Physical Activity: Not on file  Stress: Not on file  Social Connections: Not on file  Intimate Partner Violence: Not on file    Past Surgical Hx:  Past Surgical History:  Procedure Laterality Date   APPENDECTOMY     YRS AGO PER PT ON 10-31-2020   BIOPSY  11/29/2019   Procedure: BIOPSY;  Surgeon: Harvel Quale, MD;  [facility]: [facility];  Service: Gastroenterology;;  ileocecal vlve   CHOLECYSTECTOMY     YRS AGO PER PT ON 10-31-2020   COLONOSCOPY WITH PROPOFOL N/A 11/29/2019   Procedure: COLONOSCOPY WITH PROPOFOL;  Surgeon: Harvel Quale, MD;  [facility]: [facility];  Service: Gastroenterology;  Laterality: N/A;  930   HYSTEROSCOPY WITH D & C N/A 10/10/2020   Procedure: DILATATION AND CURETTAGE /HYSTEROSCOPY;  Surgeon: Florian Buff, MD;  Location: [facility];  Service:  Gynecology;  Laterality: N/A;   KNEE ARTHROPLASTY  2018   left knee   POLYPECTOMY  11/29/2019   Procedure: POLYPECTOMY;  Surgeon: Montez Morita, Quillian Quince, MD;  [facility]: [facility];  Service: Gastroenterology;;  sigmoid colon    ROBOTIC ASSISTED TOTAL HYSTERECTOMY WITH BILATERAL SALPINGO OOPHERECTOMY N/A 11/15/2020   Procedure: XI ROBOTIC ASSISTED TOTAL HYSTERECTOMY GREATER THAN TWO HUNDRED AND FIFTY GRAMS WITH BILATERAL SALPINGO OOPHORECTOMY, MINI LAPAROTOMY;  Surgeon: Everitt Amber, MD;  Location: [facility];  Service: Gynecology;  Laterality: N/A;   SENTINEL NODE BIOPSY N/A 11/15/2020   Procedure: SENTINEL NODE BIOPSY;  Surgeon: Everitt Amber, MD;  Location: [facility];  Service: Gynecology;  Laterality: N/A;   TUBAL LIGATION  04/14/1985    Past Medical Hx:  Past Medical History:  Diagnosis Date   Ambulates with cane 10/31/2020   Arthritis    RIGHT HIP   Asthma    Cigarette nicotine dependence without complication 33/43/5686   Complex tear of medial meniscus of right knee 01/20/2017   COPD (chronic obstructive pulmonary disease) ([place])    ENDOMETRIAL CANCER 10/31/2020   Endometrial cancer ([place]) 11/15/2020   GERD (gastroesophageal reflux disease)    Migraines     Past Gynecological History:  SVD x 2 Patient's last menstrual period was 04/14/2010.  Family Hx:  Family History  Problem Relation Age of Onset   Diabetes Mother    Hypertension Mother    Heart disease Mother        CHF   Stroke Mother    Heart disease Father    Colon cancer Brother    Breast cancer Neg Hx    Ovarian cancer Neg Hx    Endometrial cancer Neg Hx    Prostate cancer Neg Hx    Pancreatic cancer Neg Hx     Review of Systems:  Constitutional  Feels well,    ENT Normal appearing ears and nares bilaterally Skin/Breast  No rash, sores, jaundice, itching, dryness Cardiovascular  No chest pain, shortness of breath, or edema  Pulmonary  No cough or wheeze.  Gastro  Intestinal  No nausea, vomitting, or diarrhoea. No bright red blood per rectum, no abdominal pain, change in bowel movement, or constipation.  Genito Urinary  No frequency, urgency, dysuria, no bleeding Musculo Skeletal  No myalgia, arthralgia, joint swelling or pain  Neurologic  No weakness, numbness, change in gait,  Psychology  No  depression, anxiety, insomnia.   Vitals:  Blood pressure (!) 145/85, pulse 79, temperature 98.2 F (36.8 C), temperature source Oral, resp. rate 18, height _0  (1.575 m), weight 216 lb (98 kg), last menstrual period 04/14/2010, SpO2 98 %.  Physical Exam: WD in NAD Neck  Supple NROM, without any enlargements.  Lymph Node Survey No cervical supraclavicular or inguinal adenopathy Cardiovascular  Well perfused peripheries Lungs  Increased WOB Skin  No rash/lesions/breakdown  Psychiatry  Alert and oriented to person, place, and time  Abdomen  Normoactive bowel sounds, abdomen soft, non-tender and obese without evidence of hernia.  Mini laparotomy incision in the epigastrium revealing no drainage or palpable fluctuance.  There was blanching erythema at the skin consistent with cellulitis. Back No CVA tenderness Genito Urinary  Vulva/vagina: Well-healed vaginal cuff with normal mucosa, no lesions, no bleeding. Rectal  deferred Extremities  No bilateral cyanosis, clubbing or edema.   30 minutes of direct face to face counseling time was spent with the patient. This included discussion about prognosis, therapy recommendations and postoperative side effects and are beyond the scope of routine postoperative care.   Thereasa Solo, MD  12/07/2020, 2:55 PM

## 2020-12-07 NOTE — Patient Instructions (Signed)
Dr Denman George is recommending radiation and chemotherapy for your cancer because it had spread into the lymph nodes (was stage 3).  She will set you up to see Dr Delton Coombes at [facility]. She will set you up with the Dr Sondra Come, the radiation doctor.  After you have had therapy you will return to the [place] for 3 monthly checks.  Dr Denman George is leaving [facility] in October, however, her partner, Dr Berline Lopes will be able to see you.

## 2020-12-19 ENCOUNTER — Ambulatory Visit
Admission: RE | Admit: 2020-12-19 | Discharge: 2020-12-19 | Disposition: A | Discharge status: Home / Self Care | Payer: 59 | Source: Ambulatory Visit | Attending: Radiation Oncology | Admitting: Radiation Oncology

## 2020-12-19 ENCOUNTER — Ambulatory Visit: Payer: 59

## 2020-12-19 ENCOUNTER — Telehealth: Payer: Self-pay | Admitting: Oncology

## 2020-12-19 ENCOUNTER — Ambulatory Visit (HOSPITAL_COMMUNITY): Payer: 59 | Admitting: Hematology

## 2020-12-19 NOTE — Telephone Encounter (Signed)
Attempted to call patient x 3 regarding Radiation Oncology consult today. Was not able to leave a message.

## 2020-12-20 ENCOUNTER — Telehealth: Payer: Self-pay | Admitting: Radiation Oncology

## 2020-12-20 NOTE — Telephone Encounter (Signed)
Called to r/s missed appt for 9/7. Unable to LVM.

## 2020-12-24 ENCOUNTER — Other Ambulatory Visit: Payer: Self-pay | Admitting: Oncology

## 2020-12-24 NOTE — Progress Notes (Signed)
Gynecologic Oncology Multi-Disciplinary Disposition Conference Note  Date of the Conference: 12/24/2020  Patient Name: Michele Romero  Referring Provider: Dr. Elonda Husky Primary GYN Oncologist: Dr. Denman George  Stage/Disposition:  Stage IIIC1 carcinosarcoma of the endometrium. Disposition is to 6 cycles of chemotherapy with platinum and taxane and vaginal brachytherapy.   This Multidisciplinary conference took place involving physicians from [place], [place], Radiation Oncology, Pathology, Radiology along with the Gynecologic Oncology Nurse Practitioner and RN.  Comprehensive assessment of the patient's malignancy, staging, need for surgery, chemotherapy, radiation therapy, and need for further testing were reviewed. Supportive measures, both inpatient and following discharge were also discussed. The recommended plan of care is documented. Greater than 35 minutes were spent correlating and coordinating this patient's care.

## 2020-12-25 ENCOUNTER — Ambulatory Visit (HOSPITAL_COMMUNITY): Payer: 59 | Admitting: Hematology

## 2020-12-30 NOTE — Progress Notes (Signed)
[facility] [address] [place] [address]   CLINIC:  Medical Oncology/Hematology  CONSULT NOTE  Patient Care Team: [name], NP as PCP - General (Nurse Practitioner) Brien Mates, RN as Oncology Nurse Navigator (Oncology) Derek Jack, MD as Medical Oncologist (Oncology)  CHIEF COMPLAINTS/PURPOSE OF CONSULTATION:  Evaluation of endometrial cancer  HISTORY OF PRESENTING ILLNESS:  Michele Romero 60 y.o. female is here because of evaluation of endometrial cancer, at the request of [facility].  Today she reports feeling well.Her menses stopped at 60 years old, but resumed 1 month ago. She denies any previous history of cancer and tingling/numbness in her hands and feet. She reports history of arthritis in her hands and feet for which she walks with the assistance of a cane. Her appetite is good. She denies history of MI, CVA, and DM. She lives at home with her daughter and granddaughter. 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 She quit smoking September 2021; she previously smoked 1/2 ppd for 30 years. She denies any family history of cancer.   MEDICAL HISTORY:  Past Medical History:  Diagnosis Date   Ambulates with cane 10/31/2020   Arthritis    RIGHT HIP   Asthma    Cigarette nicotine dependence without complication 88/02/314   Complex tear of medial meniscus of right knee 01/20/2017   COPD (chronic obstructive pulmonary disease) ([facility])    ENDOMETRIAL CANCER 10/31/2020   Endometrial cancer ([facility]) 11/15/2020   GERD (gastroesophageal reflux disease)    Migraines     SURGICAL HISTORY: Past Surgical History:  Procedure Laterality Date   APPENDECTOMY     YRS AGO PER PT ON 10-31-2020   BIOPSY  11/29/2019   Procedure: BIOPSY;  Surgeon: Harvel Quale, MD;  [facility];  Service: Gastroenterology;;  ileocecal vlve   CHOLECYSTECTOMY     YRS AGO PER PT ON 10-31-2020   COLONOSCOPY WITH PROPOFOL  N/A 11/29/2019   Procedure: COLONOSCOPY WITH PROPOFOL;  Surgeon: Harvel Quale, MD;  [facility];  Service: Gastroenterology;  Laterality: N/A;  930   HYSTEROSCOPY WITH D & C N/A 10/10/2020   Procedure: DILATATION AND CURETTAGE /HYSTEROSCOPY;  Surgeon: Florian Buff, MD;  Location: [facility];  Service: Gynecology;  Laterality: N/A;   KNEE ARTHROPLASTY  2018   left knee   POLYPECTOMY  11/29/2019   Procedure: POLYPECTOMY;  Surgeon: Montez Morita, Quillian Quince, MD;  [facility]: [facility];  Service: Gastroenterology;;  sigmoid colon    ROBOTIC ASSISTED TOTAL HYSTERECTOMY WITH BILATERAL SALPINGO OOPHERECTOMY N/A 11/15/2020   Procedure: XI ROBOTIC ASSISTED TOTAL HYSTERECTOMY GREATER THAN TWO HUNDRED AND FIFTY GRAMS WITH BILATERAL SALPINGO OOPHORECTOMY, MINI LAPAROTOMY;  Surgeon: Everitt Amber, MD;  Location: [facility];  Service: Gynecology;  Laterality: N/A;   SENTINEL NODE BIOPSY N/A 11/15/2020   Procedure: SENTINEL NODE BIOPSY;  Surgeon: Everitt Amber, MD;  Location: [facility];  Service: Gynecology;  Laterality: N/A;   TUBAL LIGATION  04/14/1985    SOCIAL HISTORY: Social History   Socioeconomic History   Marital status: Single    Spouse name: Not on file   Number of children: 2   Years of education: Not on file   Highest education level: High school graduate  Occupational History   Not on file  Tobacco Use   Smoking status: Former    Packs/day: 0.50    Years: 30.00    Pack years: 15.00  Types: Cigarettes    Quit date: 01/01/2020    Years since quitting: 1.0    Passive exposure: Never   Smokeless tobacco: Never  Vaping Use   Vaping Use: Never used  Substance and Sexual Activity   Alcohol use: No   Drug use: Yes    Frequency: 6.0 times per week    Types: Marijuana    Comment: every now and then   Sexual activity: Not Currently    Birth control/protection: Surgical    Comment: tubal  Other Topics Concern   Not on file  Social  History Narrative   Lives with daughter and 1 grandbaby   Son is in [place]      Enjoys: sitting outside, music, tv      Diet: eats all food groups   Caffeine: soda and tea: 2-3 cups daily   Water: 2-3 16 oz bottles      Wears seat belt    Does not use phone while driving   Oceanographer at home    No weapons    Social Determinants of Health   Financial Resource Strain: Not on file  Food Insecurity: Not on file  Transportation Needs: Not on file  Physical Activity: Not on file  Stress: Not on file  Social Connections: Not on file  Intimate Partner Violence: Not on file    FAMILY HISTORY: Family History  Problem Relation Age of Onset   Diabetes Mother    Hypertension Mother    Heart disease Mother        CHF   Stroke Mother    Heart disease Father    Colon cancer Brother    Breast cancer Neg Hx    Ovarian cancer Neg Hx    Endometrial cancer Neg Hx    Prostate cancer Neg Hx    Pancreatic cancer Neg Hx     ALLERGIES:  is allergic to codeine.  MEDICATIONS:  Current Outpatient Medications  Medication Sig Dispense Refill   acetaminophen (TYLENOL) 500 MG tablet Take 1,000 mg by mouth every 6 (six) hours as needed for moderate pain or headache.     albuterol (VENTOLIN HFA) 108 (90 Base) MCG/ACT inhaler Inhale 2 puffs into the lungs every 6 (six) hours as needed for wheezing or shortness of breath. 8 g 2   budesonide-formoterol (SYMBICORT) 80-4.5 MCG/ACT inhaler Inhale 2 puffs into the lungs 2 (two) times daily. 10.2 g 2   calcium carbonate (TUMS - DOSED IN MG ELEMENTAL CALCIUM) 500 MG chewable tablet Chew 1 tablet by mouth as needed for indigestion or heartburn.     diphenhydrAMINE (BENADRYL) 25 MG tablet Take 25 mg by mouth daily as needed for allergies.     ibuprofen (ADVIL) 800 MG tablet Take 1 tablet (800 mg total) by mouth every 8 (eight) hours as needed for moderate pain. For AFTER surgery only 30 tablet 0   Multiple Vitamin (MULTIVITAMIN WITH MINERALS) TABS tablet  Take 1 tablet by mouth daily.     ondansetron (ZOFRAN ODT) 8 MG disintegrating tablet Take 1 tablet (8 mg total) by mouth every 8 (eight) hours as needed for nausea or vomiting. 8 tablet 0   senna-docusate (SENOKOT-S) 8.6-50 MG tablet Take 2 tablets by mouth at bedtime. For AFTER surgery, do not take if having diarrhea 30 tablet 0   traMADol (ULTRAM) 50 MG tablet Take 1 tablet (50 mg total) by mouth every 6 (six) hours as needed for severe pain. For AFTER surgery only, do not take and drive  10 tablet 0   vitamin B-12 (CYANOCOBALAMIN) 500 MCG tablet Take 500 mcg by mouth daily.     No current facility-administered medications for this visit.    REVIEW OF SYSTEMS:   Review of Systems  Constitutional:  Positive for fatigue (25%). Negative for appetite change (75%).  Respiratory:  Positive for shortness of breath.   Gastrointestinal:  Positive for constipation.  Neurological:  Negative for numbness.  Psychiatric/Behavioral:  Positive for sleep disturbance.   All other systems reviewed and are negative.   PHYSICAL EXAMINATION: ECOG PERFORMANCE STATUS: 1 - Symptomatic but completely ambulatory  Vitals:   12/31/20 1328  BP: 120/88  Pulse: (!) 110  Resp: 20  Temp: (!) 97.2 F (36.2 C)  SpO2: 96%   Filed Weights   12/31/20 1328  Weight: 209 lb 1.6 oz (94.8 kg)   Physical Exam Vitals reviewed.  Constitutional:      Appearance: Normal appearance. She is obese.     Comments: In wheelchair  Cardiovascular:     Rate and Rhythm: Normal rate and regular rhythm.     Pulses: Normal pulses.     Heart sounds: Normal heart sounds.  Pulmonary:     Effort: Pulmonary effort is normal.     Breath sounds: Normal breath sounds.  Abdominal:     General: A surgical scar is present.     Palpations: Abdomen is soft. There is no hepatomegaly, splenomegaly or mass.     Tenderness: There is no abdominal tenderness.  Musculoskeletal:     Right lower leg: No edema.     Left lower leg: No edema.   Neurological:     General: No focal deficit present.     Mental Status: She is alert and oriented to person, place, and time.  Psychiatric:        Mood and Affect: Mood normal.        Behavior: Behavior normal.     LABORATORY DATA:  I have reviewed the data as listed CBC Latest Ref Rng & Units 11/12/2020 10/04/2020 11/02/2019  WBC 4.0 - 10.5 K/uL 5.0 5.4 7.3  Hemoglobin 12.0 - 15.0 g/dL 11.6(L) 12.2 13.0  Hematocrit 36.0 - 46.0 % 35.8(L) 37.6 39.4  Platelets 150 - 400 K/uL 238 226 232   CMP Latest Ref Rng & Units 11/12/2020 10/04/2020 11/02/2019  Glucose 70 - 99 mg/dL 91 93 92  BUN 8 - 23 mg/dL 12 11 15   Creatinine 0.44 - 1.00 mg/dL 0.73 0.60 0.72  Sodium 135 - 145 mmol/L 142 138 143  Potassium 3.5 - 5.1 mmol/L 4.2 3.9 4.3  Chloride 98 - 111 mmol/L 110 104 105  CO2 22 - 32 mmol/L 28 27 29   Calcium 8.9 - 10.3 mg/dL 8.5(L) 8.7(L) 8.9  Total Protein 6.5 - 8.1 g/dL 6.9 6.9 7.3  Total Bilirubin 0.3 - 1.2 mg/dL 0.5 0.6 0.3  Alkaline Phos 38 - 126 U/L 71 68 -  AST 15 - 41 U/L 14(L) 16 13  ALT 0 - 44 U/L 11 11 12     RADIOGRAPHIC STUDIES: I have personally reviewed the radiological images as listed and agreed with the findings in the report. No results found.  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 - Endometrial biopsy on 10/10/2020 consistent with carcinosarcoma, MMR preserved. - CT scan of the abdomen and pelvis with contrast on 10/29/2020 with endometrial mass, no compelling findings of metastatic disease in the abdomen or pelvis.  30 cm fatty lesion intraluminally in the distal transverse colon probably lipoma. - Robotic assisted laparoscopic total hysterectomy with bilateral salpingo-oophorectomy and sentinel lymph node biopsy by Dr. Denman George on 11/15/2020. - Pathology consistent with carcinosarcoma spanning 8.5 cm, tumor invades less than one half of myometrium, LVI positive, 1 lymph node involved with  micrometastasis, PT1APN1 MI, MMR preserved, MSI-stable  2.  Social/family history: - Lives at home with her daughter and granddaughter.  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  She quit smoking in September 2021.  Half pack per day smoker for 30 years.  She has severe right hip arthritis, requiring her to use a cane for ambulation. - No family history of malignancies.   PLAN:  Stage III C1 (PT1APN1A) endometrial carcinosarcoma: - We have reviewed pathology report and scans in detail. - Agree with Dr. Alan Ripper recommendation of 6 cycles of adjuvant chemotherapy with carboplatin and paclitaxel followed by vaginal brachytherapy. - We discussed chemotherapy regimen and side effects in detail. - She will require port placement.  Tentative start date in the next 1 to 2 weeks.  We will consider baseline CA125 level. - RTC day 1 of chemotherapy with labs.  2.  Mild normocytic anemia: - We will obtain ferritin, iron panel E67 and folic acid levels.   All questions were answered. The patient knows to call the clinic with any problems, questions or concerns.   Derek Jack, MD, 12/31/20 2:59 PM  [facility] [phone]   I, Thana Ates, am acting as a scribe for Dr. Derek Jack.  I, Derek Jack MD, have reviewed the above documentation for accuracy and completeness, and I agree with the above.

## 2020-12-31 ENCOUNTER — Encounter (HOSPITAL_COMMUNITY): Payer: Self-pay | Admitting: Hematology

## 2020-12-31 ENCOUNTER — Other Ambulatory Visit: Payer: Self-pay

## 2020-12-31 ENCOUNTER — Inpatient Hospital Stay (HOSPITAL_COMMUNITY): Payer: 59 | Attending: Hematology | Admitting: Hematology

## 2020-12-31 ENCOUNTER — Encounter (HOSPITAL_COMMUNITY): Payer: Self-pay

## 2020-12-31 VITALS — BP 120/88 | HR 110 | Temp 97.2°F | Resp 20 | Ht 62.0 in | Wt 209.1 lb

## 2020-12-31 DIAGNOSIS — D649 Anemia, unspecified: Secondary | ICD-10-CM | POA: Insufficient documentation

## 2020-12-31 DIAGNOSIS — C541 Malignant neoplasm of endometrium: Secondary | ICD-10-CM | POA: Diagnosis not present

## 2020-12-31 NOTE — Progress Notes (Signed)
START ON PATHWAY REGIMEN - Uterine     A cycle is every 21 days:     Paclitaxel      Carboplatin   **Always confirm dose/schedule in your pharmacy ordering system**  Patient Characteristics: Carcinosarcoma, Newly Diagnosed, Postoperative (Pathologic Staging), Postoperative Histology: Carcinosarcoma Therapeutic Status: Newly Diagnosed, Postoperative (Pathologic Staging) AJCC M Category: cM0 AJCC 8 Stage Grouping: IIIC1 AJCC T Category: pT1a AJCC N Category: pN84m Intent of Therapy: Curative Intent, Discussed with Patient

## 2020-12-31 NOTE — Progress Notes (Signed)
I met with the patient today during and following initial visit with Dr. Delton Coombes. I introduced myself and explained my role in the patient's care. I provided my contact information and encouraged the patient to call with questions or concerns. I provided written information on the chemotherapy medications as proposed by Dr. Delton Coombes. Patient scheduled for further chemotherapy education on 01/01/21, patient aware and agreeable.

## 2020-12-31 NOTE — Patient Instructions (Signed)
[facility] at [facility] Discharge Instructions  You were seen and examined today by Dr. Delton Coombes. Dr. Delton Coombes is a medical oncologist, meaning he specializes in the management of cancer diagnoses. Dr. Delton Coombes discussed your past medical history, family history of cancer and the events that led to you being here today.  You have been diagnosed with Stage III Endometrial Cancer. It is an aggressive type of cancer and was present in one lymph node. Dr. Delton Coombes recommended chemotherapy every 3 weeks for 6 treatments. Chemotherapy will consist of two medications known as Carboplatin and Paclitaxel. You will meet with our Chemotherapy Educator prior to beginning treatment.  The safest way to administer chemotherapy is through a Port-A-Cath. This is a button like access placed under the skin. This can also be used for lab draws.  Follow-up with Dr. Delton Coombes on your first day of treatment.   Thank you for choosing [facility] at [facility] to provide your oncology and hematology care.  To afford each patient quality time with our provider, please arrive at least 15 minutes before your scheduled appointment time.   If you have a lab appointment with the [facility] please come in thru the Main Entrance and check in at the main information desk.  You need to re-schedule your appointment should you arrive 10 or more minutes late.  We strive to give you quality time with our providers, and arriving late affects you and other patients whose appointments are after yours.  Also, if you no show three or more times for appointments you may be dismissed from the clinic at the providers discretion.     Again, thank you for choosing [facility].  Our hope is that these requests will decrease the amount of time that you wait before being seen by our physicians.       _____________________________________________________________  Should you  have questions after your visit to [facility], please contact our office at [phone] and follow the prompts.  Our office hours are 8:00 a.m. and 4:30 p.m. Monday - Friday.  Please note that voicemails left after 4:00 p.m. may not be returned until the following business day.  We are closed weekends and major holidays.  You do have access to a nurse 24-7, just call the main number to the clinic [phone] and do not press any options, hold on the line and a nurse will answer the phone.    For prescription refill requests, have your pharmacy contact our office and allow 72 hours.    Due to Covid, you will need to wear a mask upon entering the hospital. If you do not have a mask, a mask will be given to you at the Main Entrance upon arrival. For doctor visits, patients may have 1 support person age 61 or older with them. For treatment visits, patients can not have anyone with them due to social distancing guidelines and our immunocompromised population.

## 2021-01-01 ENCOUNTER — Encounter (HOSPITAL_COMMUNITY): Payer: Self-pay | Admitting: Hematology

## 2021-01-01 ENCOUNTER — Encounter (HOSPITAL_COMMUNITY): Payer: Self-pay

## 2021-01-01 ENCOUNTER — Ambulatory Visit (HOSPITAL_COMMUNITY): Payer: 59

## 2021-01-01 DIAGNOSIS — Z95828 Presence of other vascular implants and grafts: Secondary | ICD-10-CM

## 2021-01-01 HISTORY — DX: Presence of other vascular implants and grafts: Z95.828

## 2021-01-01 MED ORDER — PROCHLORPERAZINE MALEATE 10 MG PO TABS: 10.0000 mg | ORAL_TABLET | Freq: Four times a day (QID) | ORAL | 1 refills | Status: DC | PRN

## 2021-01-01 MED ORDER — LIDOCAINE-PRILOCAINE 2.5-2.5 % EX CREA: TOPICAL_CREAM | CUTANEOUS | 3 refills | Status: DC

## 2021-01-01 NOTE — Patient Instructions (Addendum)
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  You will be treated in the clinic every 3 weeks for 6 cycles with a combination of chemotherapy drugs.  Those drugs are paclitaxel (Taxol) and carboplatin.  The intent of treatment is cure.  You will see the doctor regularly throughout treatment.  We will obtain blood work from you prior to every treatment and monitor your results to make sure it is safe to give your treatment. The doctor monitors your response to treatment by the way you are feeling, your blood work, and by obtaining scans periodically.  There will be wait times while you are here for treatment.  It will take about 30 minutes to 1 hour for your lab work to result.  Then there will be wait times while pharmacy mixes your medications.    Medications you will receive in the clinic prior to your chemotherapy medications:   Aloxi:  ALOXI is used in adults to help prevent the nausea and vomiting that happens with certain chemotherapy drugs.  Aloxi is a long acting medication, and will remain in your system for  about 2 days.   Emend:  This is an anti-nausea medication that is used with Aloxi to help prevent nausea and vomiting caused by chemotherapy.  Dexamethasone:  This is a steroid given prior to chemotherapy to help prevent allergic reactions; it may also help prevent and control nausea and diarrhea.     Dexamethasone:  This is a steroid given prior to chemotherapy to help prevent allergic reactions; it may also help prevent and control nausea and diarrhea.    Pepcid:  This medication is a histamine blocker that helps prevent and allergic reaction to your chemotherapy.    Benadryl:  This is a histamine blocker (different from the Pepcid) that helps prevent allergic/infusion reactions to your chemotherapy. This medication may cause dizziness/drowsiness.      Paclitaxel (Taxol)  About This Drug Paclitaxel is a drug  used to treat cancer. It is given in the vein (IV).  This will take 3 hours to infuse.  This first infusion will take longer to infuse because it is increased slowly to monitor for reactions.  The nurse will be in the room with you for the first 15 minutes of the first infusion.  Possible Side Effects   Hair loss. Hair loss is often temporary, although with certain medicine, hair loss can sometimes be permanent. Hair loss may happen suddenly or gradually. If you lose hair, you may lose it from your head, face, armpits, pubic area, chest, and/or legs. You may also notice your hair getting thin.   Swelling of your legs, ankles and/or feet (edema)   Flushing   Nausea and throwing up (vomiting)   Loose bowel movements (diarrhea)   Bone marrow depression. This is a decrease in the number of white blood cells, red blood cells, and platelets. This may raise your risk of infection, make you tired and weak (fatigue), and raise your risk of bleeding.   Effects on the nerves are called peripheral neuropathy. You may feel numbness, tingling, or pain in your hands and feet. It may be hard for you to button your clothes, open jars, or walk as usual. The effect on the nerves may get worse with more doses of the drug. These effects get better in some people after the drug is stopped but it does not get better in all people.   Changes in your liver  function   Bone, joint and muscle pain   Abnormal EKG   Allergic reaction: Allergic reactions, including anaphylaxis are rare but may happen in some patients. Signs of allergic reaction to this drug may be swelling of the face, feeling like your tongue or throat are swelling, trouble breathing, rash, itching, fever, chills, feeling dizzy, and/or feeling that your heart is beating in a fast or not normal way. If this happens, do not take another dose of this drug. You should get urgent medical treatment.   Infection   Changes in your kidney function.  Note:  Each of the side effects above was reported in 20% or greater of patients treated with paclitaxel. Not all possible side effects are included above.  Warnings and Precautions   Severe allergic reactions   Severe bone marrow depression  Treating Side Effects   To help with hair loss, wash with a mild shampoo and avoid washing your hair every day.   Avoid rubbing your scalp, instead, pat your hair or scalp dry   Avoid coloring your hair   Limit your use of hair spray, electric curlers, blow dryers, and curling irons.   If you are interested in getting a wig, talk to your nurse. You can also call the [place] at [phone]-2345 to find out information about the "Look Good, Feel Better" program close to where you live. It is a free program where women getting chemotherapy can learn about wigs, turbans and scarves as well as makeup techniques and skin and nail care.   Ask your doctor or nurse about medicines that are available to help stop or lessen diarrhea and/or nausea.   To help with nausea and vomiting, eat small, frequent meals instead of three large meals a day. Choose foods and drinks that are at room temperature. Ask your nurse or doctor about other helpful tips and medicine that is available to help or stop lessen these symptoms.   If you get diarrhea, eat low-fiber foods that are high in protein and calories and avoid foods that can irritate your digestive tracts or lead to cramping. Ask your nurse or doctor about medicine that can lessen or stop your diarrhea.   Mouth care is very important. Your mouth care should consist of routine, gentle cleaning of your teeth or dentures and rinsing your mouth with a mixture of 1/2 teaspoon of salt in 8 ounces of water or  teaspoon of baking soda in 8 ounces of water. This should be done at least after each meal and at bedtime.   If you have mouth sores, avoid mouthwash that has alcohol. Also avoid alcohol and smoking because they  can bother your mouth and throat.   Drink plenty of fluids (a minimum of eight glasses per day is recommended).   Take your temperature as your doctor or nurse tells you, and whenever you feel like you may have a fever.   Talk to your doctor or nurse about precautions you can take to avoid infections and bleeding.   Be careful when cooking, walking, and handling sharp objects and hot liquids.  Food and Drug Interactions   There are no known interactions of paclitaxel with food.   This drug may interact with other medicines. Tell your doctor and pharmacist about all the medicines and dietary supplements (vitamins, minerals, herbs and others) that you are taking at this time.   The safety and use of dietary supplements and alternative diets are often not known. Using these might  affect your cancer or interfere with your treatment. Until more is known, you should not use dietary supplements or alternative diets without your cancer doctor's help.  When to Call the Doctor  Call your doctor or nurse if you have any of the following symptoms and/or any new or unusual symptoms:   Fever of 100.4 F (38 C) or above   Chills   Redness, pain, warmth, or swelling at the IV site during the infusion   Signs of allergic reaction: swelling of the face, feeling like your tongue or throat are swelling, trouble breathing, rash, itching, fever, chills, feeling dizzy, and/or feeling that your heart is beating in a fast or not normal way   Feeling that your heart is beating in a fast or not normal way (palpitations)   Weight gain of 5 pounds in one week (fluid retention)   Decreased urine or very dark urine   Signs of liver problems: dark urine, pale bowel movements, bad stomach pain, feeling very tired and weak, unusual  itching, or yellowing of the eyes or skin   Heavy menstrual period that lasts longer than normal   Easy bruising or bleeding   Nausea that stops you from eating or drinking,  and/or that is not relieved by prescribed medicines.   Loose bowel movements (diarrhea) more than 4 times a day or diarrhea with weakness or lightheadedness   Pain in your mouth or throat that makes it hard to eat or drink   Lasting loss of appetite or rapid weight loss of five pounds in a week   Signs of peripheral neuropathy: numbness, tingling, or decreased feeling in fingers or toes; trouble walking or changes in the way you walk; or feeling clumsy when buttoning clothes, opening jars, or other routine activities   Joint and muscle pain that is not relieved by prescribed medicines   Extreme fatigue that interferes with normal activities   While you are getting this drug, please tell your nurse right away if you have any pain, redness, or swelling at the site of the IV infusion.   If you think you are pregnant.  Reproduction Warnings   Pregnancy warning: This drug may have harmful effects on the unborn child, it is recommended that effective methods of birth control should be used during your cancer treatment. Let your doctor know right away if you think you may be pregnant.   Breast feeding warning: Women should not breast feed during treatment because this drug could enter the breastmilk and cause harm to a breast feeding baby.   Carboplatin (Paraplatin, CBDCA)  About This Drug  Carboplatin is used to treat cancer. It is given in the vein (IV).  It will take 30 minutes to infuse.   Possible Side Effects   Bone marrow suppression. This is a decrease in the number of white blood cells, red blood cells, and platelets. This may raise your risk of infection, make you tired and weak (fatigue), and raise your risk of bleeding.   Nausea and vomiting (throwing up)   Weakness   Changes in your liver function   Changes in your kidney function   Electrolyte changes   Pain  Note: Each of the side effects above was reported in 20% or greater of patients treated with  carboplatin. Not all possible side effects are included above.   Warnings and Precautions   Severe bone marrow suppression   Allergic reactions, including anaphylaxis are rare but may happen in some patients. Signs of allergic  reaction to this drug may be swelling of the face, feeling like your tongue or throat are swelling, trouble breathing, rash, itching, fever, chills, feeling dizzy, and/or feeling that your heart is beating in a fast or not normal way. If this happens, do not take another dose of this drug. You should get urgent medical treatment.   Severe nausea and vomiting   Effects on the nerves are called peripheral neuropathy. This risk is increased if you are over the age of 34 or if you have received other medicine with risk of peripheral neuropathy. You may feel numbness, tingling, or pain in your hands and feet. It may be hard for you to button your clothes, open jars, or walk as usual. The effect on the nerves may get worse with more doses of the drug. These effects get better in some people after the drug is stopped but it does not get better in all people.   Blurred vision, loss of vision or other changes in eyesight   Decreased hearing   - Skin and tissue irritation including redness, pain, warmth, or swelling at the IV site if the drug leaks out of the vein and into nearby tissue.   Severe changes in your kidney function, which can cause kidney failure   Severe changes in your liver function, which can cause liver failure  Note: Some of the side effects above are very rare. If you have concerns and/or questions, please discuss them with your medical team.   Important Information   This drug may be present in the saliva, tears, sweat, urine, stool, vomit, semen, and vaginal secretions. Talk to your doctor and/or your nurse about the necessary precautions to take during this time.   Treating Side Effects   Manage tiredness by pacing your activities for the day.    Be sure to include periods of rest between energy-draining activities.   To decrease the risk of infection, wash your hands regularly.   Avoid close contact with people who have a cold, the flu, or other infections.   Take your temperature as your doctor or nurse tells you, and whenever you feel like you may have a fever.   To help decrease the risk of bleeding, use a soft toothbrush. Check with your nurse before using dental floss.   Be very careful when using knives or tools.   Use an electric shaver instead of a razor.   Drink plenty of fluids (a minimum of eight glasses per day is recommended).   If you throw up or have loose bowel movements, you should drink more fluids so that you do not become dehydrated (lack of water in the body from losing too much fluid).   To help with nausea and vomiting, eat small, frequent meals instead of three large meals a day. Choose foods and drinks that are at room temperature. Ask your nurse or doctor about other helpful tips and medicine that is available to help stop or lessen these symptoms.   If you have numbness and tingling in your hands and feet, be careful when cooking, walking, and handling sharp objects and hot liquids.   Keeping your pain under control is important to your well-being. Please tell your doctor or nurse if you are experiencing pain.   Food and Drug Interactions   There are no known interactions of carboplatin with food.   This drug may interact with other medicines. Tell your doctor and pharmacist about all the prescription and over-the-counter medicines and dietary  supplements (vitamins, minerals, herbs and others) that you are taking at this time. Also, check with your doctor or pharmacist before starting any new prescription or over-the-counter medicines, or dietary supplements to make sure that there are no interactions.   When to Call the Doctor  Call your doctor or nurse if you have any of these symptoms and/or  any new or unusual symptoms:   Fever of 100.4 F (38 C) or higher   Chills   Tiredness that interferes with your daily activities   Feeling dizzy or lightheaded   Easy bleeding or bruising   Nausea that stops you from eating or drinking and/or is not relieved by prescribed medicines   Throwing up   Blurred vision or other changes in eyesight   Decrease in hearing or ringing in the ear   Signs of allergic reaction: swelling of the face, feeling like your tongue or throat are swelling, trouble breathing, rash, itching, fever, chills, feeling dizzy, and/or feeling that your heart is beating in a fast or not normal way. If this happens, call 911 for emergency care.   Signs of possible liver problems: dark urine, pale bowel movements, bad stomach pain, feeling very tired and weak, unusual itching, or yellowing of the eyes or skin   Decreased urine, or very dark urine   Numbness, tingling, or pain in your hands and feet   Pain that does not go away or is not relieved by prescribed medicine   While you are getting this drug, please tell your nurse right away if you have any pain, redness, or swelling at the site of the IV infusion, or if you have any new onset of symptoms, or if you just feel "different" from before when the infusion was started.   Reproduction Warnings   Pregnancy warning: This drug may have harmful effects on the unborn baby. Women of child bearing potential should use effective methods of birth control during your cancer treatment. Let your doctor know right away if you think you may be pregnant.   Breastfeeding warning: It is not known if this drug passes into breast milk. For this reason, women should not breastfeed during treatment because this drug could enter the breast milk and cause harm to a breastfeeding baby.   Fertility warning: Human fertility studies have not been done with this drug. Talk with your doctor or nurse if you plan to have children. Ask  for information on sperm or egg banking.   SELF CARE ACTIVITIES WHILE RECEIVING CHEMOTHERAPY:  Hydration Increase your fluid intake 48 hours prior to treatment and drink at least 8 to 12 cups (64 ounces) of water/decaffeinated beverages per day after treatment. You can still have your cup of coffee or soda but these beverages do not count as part of your 8 to 12 cups that you need to drink daily. No alcohol intake.  Medications Continue taking your normal prescription medication as prescribed.  If you start any new herbal or new supplements please let us know first to make sure it is safe.  Mouth Care Have teeth cleaned professionally before starting treatment. Keep dentures and partial plates clean. Use soft toothbrush and do not use mouthwashes that contain alcohol. Biotene is a good mouthwash that is available at most pharmacies or may be ordered by calling [phone]. Use warm salt water gargles (1 teaspoon salt per 1 quart warm water) before and after meals and at bedtime. If you need dental work, please let the doctor know before  you go for your appointment so that we can coordinate the best possible time for you in regards to your chemo regimen. You need to also let your dentist know that you are actively taking chemo. We may need to do labs prior to your dental appointment.  Skin Care Always use sunscreen that has not expired and with SPF (Sun Protection Factor) of 50 or higher. Wear hats to protect your head from the sun. Remember to use sunscreen on your hands, ears, face, & feet.  Use good moisturizing lotions such as udder cream, eucerin, or even Vaseline. Some chemotherapies can cause dry skin, color changes in your skin and nails.    Avoid long, hot showers or baths. Use gentle, fragrance-free soaps and laundry detergent. Use moisturizers, preferably creams or ointments rather than lotions because the thicker consistency is better at preventing skin dehydration. Apply the cream  or ointment within 15 minutes of showering. Reapply moisturizer at night, and moisturize your hands every time after you wash them.  Hair Loss (if your doctor says your hair will fall out)  If your doctor says that your hair is likely to fall out, decide before you begin chemo whether you want to wear a wig. You may want to shop before treatment to match your hair color. Hats, turbans, and scarves can also camouflage hair loss, although some people prefer to leave their heads uncovered. If you go bare-headed outdoors, be sure to use sunscreen on your scalp. Cut your hair short. It eases the inconvenience of shedding lots of hair, but it also can reduce the emotional impact of watching your hair fall out. Don't perm or color your hair during chemotherapy. Those chemical treatments are already damaging to hair and can enhance hair loss. Once your chemo treatments are done and your hair has grown back, it's OK to resume dyeing or perming hair.  With chemotherapy, hair loss is almost always temporary. But when it grows back, it may be a different color or texture. In older adults who still had hair color before chemotherapy, the new growth may be completely gray.  Often, new hair is very fine and soft.  Infection Prevention Please wash your hands for at least 30 seconds using warm soapy water. Handwashing is the #1 way to prevent the spread of germs. Stay away from sick people or people who are getting over a cold. If you develop respiratory systems such as green/yellow mucus production or productive cough or persistent cough let us know and we will see if you need an antibiotic. It is a good idea to keep a pair of gloves on when going into grocery stores/[employer] to decrease your risk of coming into contact with germs on the carts, etc. Carry alcohol hand gel with you at all times and use it frequently if out in public. If your temperature reaches 100.4 or higher please call the clinic and let us know.  If  it is after hours or on the weekend please go to the ER if your temperature is over 100.4.  Please have your own personal thermometer at home to use.    Sex and bodily fluids If you are going to have sex, a condom must be used to protect the person that isn't taking chemotherapy. Chemo can decrease your libido (sex drive). For a few days after chemotherapy, chemotherapy can be excreted through your bodily fluids.  When using the toilet please close the lid and flush the toilet twice.  Do this for a  few day after you have had chemotherapy.   Effects of chemotherapy on your sex life Some changes are simple and won't last long. They won't affect your sex life permanently.  Sometimes you may feel: too tired not strong enough to be very active sick or sore  not in the mood anxious or low  Your anxiety might not seem related to sex. For example, you may be worried about the cancer and how your treatment is going. Or you may be worried about money, or about how you family are coping with your illness.  These things can cause stress, which can affect your interest in sex. It's important to talk to your partner about how you feel.  Remember - the changes to your sex life don't usually last long. There's usually no medical reason to stop having sex during chemo. The drugs won't have any long term physical effects on your performance or enjoyment of sex. Cancer can't be passed on to your partner during sex  Contraception It's important to use reliable contraception during treatment. Avoid getting pregnant while you or your partner are having chemotherapy. This is because the drugs may harm the baby. Sometimes chemotherapy drugs can leave a man or woman infertile.  This means you would not be able to have children in the future. You might want to talk to someone about permanent infertility. It can be very difficult to learn that you may no longer be able to have children. Some people find counselling helpful.  There might be ways to preserve your fertility, although this is easier for men than for women. You may want to speak to a fertility expert. You can talk about sperm banking or harvesting your eggs. You can also ask about other fertility options, such as donor eggs. If you have or have had breast cancer, your doctor might advise you not to take the contraceptive pill. This is because the hormones in it might affect the cancer. It is not known for sure whether or not chemotherapy drugs can be passed on through semen or secretions from the vagina. Because of this some doctors advise people to use a barrier method if you have sex during treatment. This applies to vaginal, anal or oral sex. Generally, doctors advise a barrier method only for the time you are actually having the treatment and for about a week after your treatment. Advice like this can be worrying, but this does not mean that you have to avoid being intimate with your partner. You can still have close contact with your partner and continue to enjoy sex.  Animals If you have cats or birds we just ask that you not change the litter or change the cage.  Please have someone else do this for you while you are on chemotherapy.   Food Safety During and After Cancer Treatment Food safety is important for people both during and after cancer treatment. Cancer and cancer treatments, such as chemotherapy, radiation therapy, and stem cell/bone marrow transplantation, often weaken the immune system. This makes it harder for your body to protect itself from foodborne illness, also called food poisoning. Foodborne illness is caused by eating food that contains harmful bacteria, parasites, or viruses.  Foods to avoid Some foods have a higher risk of becoming tainted with bacteria. These include: Unwashed fresh fruit and vegetables, especially leafy vegetables that can hide dirt and other contaminants Raw sprouts, such as alfalfa sprouts Raw or undercooked  beef, especially ground beef, or other raw or undercooked  meat and poultry Fatty, fried, or spicy foods immediately before or after treatment.  These can sit heavy on your stomach and make you feel nauseous. Raw or undercooked shellfish, such as oysters. Sushi and sashimi, which often contain raw fish.  Unpasteurized beverages, such as unpasteurized fruit juices, raw milk, raw yogurt, or cider Undercooked eggs, such as soft boiled, over easy, and poached; raw, unpasteurized eggs; or foods made with raw egg, such as homemade raw cookie dough and homemade mayonnaise  Simple steps for food safety  Shop smart. Do not buy food stored or displayed in an unclean area. Do not buy bruised or damaged fruits or vegetables. Do not buy cans that have cracks, dents, or bulges. Pick up foods that can spoil at the end of your shopping trip and store them in a cooler on the way home.  Prepare and clean up foods carefully. Rinse all fresh fruits and vegetables under running water, and dry them with a clean towel or paper towel. Clean the top of cans before opening them. After preparing food, wash your hands for 20 seconds with hot water and soap. Pay special attention to areas between fingers and under nails. Clean your utensils and dishes with hot water and soap. Disinfect your kitchen and cutting boards using 1 teaspoon of liquid, unscented bleach mixed into 1 quart of water.    Dispose of old food. Eat canned and packaged food before its expiration date (the "use by" or "best before" date). Consume refrigerated leftovers within 3 to 4 days. After that time, throw out the food. Even if the food does not smell or look spoiled, it still may be unsafe. Some bacteria, such as Listeria, can grow even on foods stored in the refrigerator if they are kept for too long.  Take precautions when eating out. At restaurants, avoid buffets and salad bars where food sits out for a long time and comes in contact with many  people. Food can become contaminated when someone with a virus, often a norovirus, or another "bug" handles it. Put any leftover food in a "to-go" container yourself, rather than having the server do it. And, refrigerate leftovers as soon as you get home. Choose restaurants that are clean and that are willing to prepare your food as you order it cooked.   AT HOME MEDICATIONS:                                                                                                                                                                Compazine/Prochlorperazine 10mg  tablet. Take 1 tablet every 6 hours as needed for nausea/vomiting. (This can make you sleepy)   EMLA cream. Apply a quarter size amount to port site 1 hour prior to chemo. Do not rub in. Cover with  plastic wrap.    Diarrhea Sheet   If you are having loose stools/diarrhea, please purchase Imodium and begin taking as outlined:  At the first sign of poorly formed or loose stools you should begin taking Imodium (loperamide) 2 mg capsules.  Take two tablets (4mg ) followed by one tablet (2mg ) every 2 hours - DO NOT EXCEED 8 tablets in 24 hours.  If it is bedtime and you are having loose stools, take 2 tablets at bedtime, then 2 tablets every 4 hours until morning.   Always call the [facility] if you are having loose stools/diarrhea that you can't get under control.  Loose stools/diarrhea leads to dehydration (loss of water) in your body.  We have other options of trying to get the loose stools/diarrhea to stop but you must let us know!   Constipation Sheet  Colace - 100 mg capsules - take 2 capsules daily.  If this doesn't help then you can increase to 2 capsules twice daily.  Please call if the above does not work for you. Do not go more than 2 days without a bowel movement.  It is very important that you do not become constipated.  It will make you feel sick to your stomach (nausea) and can cause abdominal pain and vomiting.  Nausea  Sheet   Compazine/Prochlorperazine 10mg  tablet. Take 1 tablet every 6 hours as needed for nausea/vomiting (This can make you drowsy).  If you are having persistent nausea (nausea that does not stop) please call the [facility] and let us know the amount of nausea that you are experiencing.  If you begin to vomit, you need to call the [facility] and if it is the weekend and you have vomited more than one time and can't get it to stop-go to the Emergency Room.  Persistent nausea/vomiting can lead to dehydration (loss of fluid in your body) and will make you feel very weak and unwell. Ice chips, sips of clear liquids, foods that are at room temperature, crackers, and toast tend to be better tolerated.   SYMPTOMS TO REPORT AS SOON AS POSSIBLE AFTER TREATMENT:  FEVER GREATER THAN 100.4 F  CHILLS WITH OR WITHOUT FEVER  NAUSEA AND VOMITING THAT IS NOT CONTROLLED WITH YOUR NAUSEA MEDICATION  UNUSUAL SHORTNESS OF BREATH  UNUSUAL BRUISING OR BLEEDING  TENDERNESS IN MOUTH AND THROAT WITH OR WITHOUT PRESENCE OF ULCERS  URINARY PROBLEMS  BOWEL PROBLEMS  UNUSUAL RASH      Wear comfortable clothing and clothing appropriate for easy access to any Portacath or PICC line. Let us know if there is anything that we can do to make your therapy better!    What to do if you need assistance after hours or on the weekends: CALL [phone].  HOLD on the line, do not hang up.  You will hear multiple messages but at the end you will be connected with a nurse triage line.  They will contact the doctor if necessary.  Most of the time they will be able to assist you.  Do not call the hospital operator.      I have been informed and understand all of the instructions given to me and have received a copy. I have been instructed to call the clinic [phone] or my family physician as soon as possible for continued medical care, if indicated. I do not have any more questions at this time but understand  that I may call the [facility] or the Patient Navigator at [phone] during office hours should I have questions or need assistance in obtaining follow-up care.

## 2021-01-02 ENCOUNTER — Telehealth: Payer: Self-pay | Admitting: Oncology

## 2021-01-02 NOTE — Telephone Encounter (Signed)
Michele Romero called and rescheduled her consult with Dr. Sondra Come to 01/17/21.

## 2021-01-04 ENCOUNTER — Encounter (HOSPITAL_COMMUNITY): Payer: Self-pay | Admitting: Hematology

## 2021-01-04 ENCOUNTER — Other Ambulatory Visit: Payer: Self-pay | Admitting: Internal Medicine

## 2021-01-07 ENCOUNTER — Other Ambulatory Visit: Payer: Self-pay | Admitting: Radiology

## 2021-01-07 ENCOUNTER — Other Ambulatory Visit (HOSPITAL_COMMUNITY): Payer: Self-pay | Admitting: Physician Assistant

## 2021-01-08 ENCOUNTER — Other Ambulatory Visit: Payer: Self-pay

## 2021-01-08 ENCOUNTER — Ambulatory Visit (HOSPITAL_COMMUNITY): Payer: 59

## 2021-01-08 ENCOUNTER — Ambulatory Visit (HOSPITAL_COMMUNITY): Admission: RE | Admit: 2021-01-08 | Payer: 59 | Source: Ambulatory Visit

## 2021-01-08 ENCOUNTER — Ambulatory Visit (HOSPITAL_COMMUNITY)
Admission: RE | Admit: 2021-01-08 | Discharge: 2021-01-08 | Disposition: A | Discharge status: Home / Self Care | Payer: 59 | Source: Ambulatory Visit

## 2021-01-08 ENCOUNTER — Encounter (HOSPITAL_COMMUNITY): Payer: Self-pay

## 2021-01-08 ENCOUNTER — Ambulatory Visit (HOSPITAL_COMMUNITY)
Admission: RE | Admit: 2021-01-08 | Discharge: 2021-01-08 | Disposition: A | Discharge status: Home / Self Care | Payer: 59 | Source: Ambulatory Visit | Attending: Hematology | Admitting: Hematology

## 2021-01-08 DIAGNOSIS — Z87891 Personal history of nicotine dependence: Secondary | ICD-10-CM | POA: Insufficient documentation

## 2021-01-08 DIAGNOSIS — C541 Malignant neoplasm of endometrium: Secondary | ICD-10-CM | POA: Diagnosis present

## 2021-01-08 DIAGNOSIS — Z79899 Other long term (current) drug therapy: Secondary | ICD-10-CM | POA: Insufficient documentation

## 2021-01-08 HISTORY — PX: IR IMAGING GUIDED PORT INSERTION: IMG5740

## 2021-01-08 MED ORDER — FENTANYL CITRATE (PF) 100 MCG/2ML IJ SOLN
INTRAMUSCULAR | Status: AC
  Filled 2021-01-08: qty 2

## 2021-01-08 MED ORDER — FENTANYL CITRATE (PF) 100 MCG/2ML IJ SOLN
INTRAMUSCULAR | Status: DC | PRN
  Administered 2021-01-08 (×2): 50 ug via INTRAVENOUS

## 2021-01-08 MED ORDER — MIDAZOLAM HCL 2 MG/2ML IJ SOLN
INTRAMUSCULAR | Status: DC | PRN
  Administered 2021-01-08: 1 mg via INTRAVENOUS

## 2021-01-08 MED ORDER — MIDAZOLAM HCL 2 MG/2ML IJ SOLN
INTRAMUSCULAR | Status: DC | PRN
Start: 2021-01-08 — End: 2021-01-09
  Administered 2021-01-08: 1 mg via INTRAVENOUS

## 2021-01-08 MED ORDER — MIDAZOLAM HCL 2 MG/2ML IJ SOLN
INTRAMUSCULAR | Status: AC
  Filled 2021-01-08: qty 4

## 2021-01-08 MED ORDER — LIDOCAINE-EPINEPHRINE (PF) 2 %-1:200000 IJ SOLN
INTRAMUSCULAR | Status: DC | PRN
  Administered 2021-01-08: 10 mL via INTRADERMAL

## 2021-01-08 MED ORDER — HEPARIN SOD (PORK) LOCK FLUSH 100 UNIT/ML IV SOLN
INTRAVENOUS | Status: DC | PRN
  Administered 2021-01-08 (×2): 500 [IU]

## 2021-01-08 MED ORDER — LIDOCAINE-EPINEPHRINE (PF) 2 %-1:200000 IJ SOLN
INTRAMUSCULAR | Status: AC
  Filled 2021-01-08: qty 20

## 2021-01-08 MED ORDER — HEPARIN SOD (PORK) LOCK FLUSH 100 UNIT/ML IV SOLN
INTRAVENOUS | Status: AC
  Filled 2021-01-08: qty 5

## 2021-01-08 MED ORDER — SODIUM CHLORIDE 0.9 % IV SOLN: INTRAVENOUS | Status: DC

## 2021-01-08 NOTE — H&P (Signed)
Chief Complaint: Patient was seen in consultation today for port-a-catheter placement   Referring Physician(s): Katragadda,Sreedhar  Supervising Physician: Juliet Rude  Patient Status: [facility] - Out-pt  History of Present Illness: Michele Romero is a 60 y.o. female with a medical history significant for endometrial cancer. She developed post-menopausal bleeding in June 2022 and work up revealed endometrial cancer. She is s/p robotic-assisted total hysterectomy with bilateral salpingo-oophorectomy (11/15/20) and her oncology team is preparing her for adjuvant chemotherapy. Interventional Radiology has been asked to evaluate this patient for an image-guided port-a-catheter to facilitate her treatment goals.   Past Medical History:  Diagnosis Date   Ambulates with cane 10/31/2020   Arthritis    RIGHT HIP   Asthma    Cigarette nicotine dependence without complication 79/39/0300   Complex tear of medial meniscus of right knee 01/20/2017   COPD (chronic obstructive pulmonary disease) ([place])    ENDOMETRIAL CANCER 10/31/2020   Endometrial cancer ([place]) 11/15/2020   GERD (gastroesophageal reflux disease)    Migraines    Port-A-Cath in place 01/01/2021    Past Surgical History:  Procedure Laterality Date   APPENDECTOMY     YRS AGO PER PT ON 10-31-2020   BIOPSY  11/29/2019   Procedure: BIOPSY;  Surgeon: Harvel Quale, MD;  [facility];  Service: Gastroenterology;;  ileocecal vlve   CHOLECYSTECTOMY     YRS AGO PER PT ON 10-31-2020   COLONOSCOPY WITH PROPOFOL N/A 11/29/2019   Procedure: COLONOSCOPY WITH PROPOFOL;  Surgeon: Harvel Quale, MD;  Location: [facility];  Service: Gastroenterology;  Laterality: N/A;  930   HYSTEROSCOPY WITH D & C N/A 10/10/2020   Procedure: DILATATION AND CURETTAGE /HYSTEROSCOPY;  Surgeon: Florian Buff, MD;  Location: [facility] OR[facility];  Service: Gynecology;  Laterality: N/A;   KNEE ARTHROPLASTY  2018   left knee   POLYPECTOMY   11/29/2019   Procedure: POLYPECTOMY;  Surgeon: Montez Morita, Quillian Quince, MD;  Location: [facility];  Service: Gastroenterology;;  sigmoid colon    ROBOTIC ASSISTED TOTAL HYSTERECTOMY WITH BILATERAL SALPINGO OOPHERECTOMY N/A 11/15/2020   Procedure: XI ROBOTIC ASSISTED TOTAL HYSTERECTOMY GREATER THAN TWO HUNDRED AND FIFTY GRAMS WITH BILATERAL SALPINGO OOPHORECTOMY, MINI LAPAROTOMY;  Surgeon: Everitt Amber, MD;  Location: [facility];  Service: Gynecology;  Laterality: N/A;   SENTINEL NODE BIOPSY N/A 11/15/2020   Procedure: SENTINEL NODE BIOPSY;  Surgeon: Everitt Amber, MD;  Location: [facility];  Service: Gynecology;  Laterality: N/A;   TUBAL LIGATION  04/14/1985    Allergies: Codeine  Medications: Prior to Admission medications   Medication Sig Start Date End Date Taking? Authorizing Provider  acetaminophen (TYLENOL) 500 MG tablet Take 1,000 mg by mouth every 6 (six) hours as needed for moderate pain or headache.    [provider]  albuterol (VENTOLIN HFA) 108 (90 Base) MCG/ACT inhaler Inhale 2 puffs into the lungs every 6 (six) hours as needed for wheezing or shortness of breath. 10/09/20   [name], NP  budesonide-formoterol (SYMBICORT) 80-4.5 MCG/ACT inhaler Inhale 2 puffs into the lungs 2 (two) times daily. 10/09/20   [name], NP  calcium carbonate (TUMS - DOSED IN MG ELEMENTAL CALCIUM) 500 MG chewable tablet Chew 1 tablet by mouth as needed for indigestion or heartburn.    [provider]  CARBOPLATIN IV Inject into the vein every 21 ( twenty-one) days. 01/15/21   [provider]  diphenhydrAMINE (BENADRYL) 25 MG tablet Take 25 mg by mouth  daily as needed for allergies.    [provider]  ibuprofen (ADVIL) 800 MG tablet Take 1 tablet (800 mg total) by mouth every 8 (eight) hours as needed for moderate pain. For AFTER surgery only 10/24/20   Everitt Amber, MD  lidocaine-prilocaine (EMLA) cream Apply a small amount to  port a cath site and cover with plastic wrap 1 hour prior to infusion appointments 01/01/21   Derek Jack, MD  Multiple Vitamin (MULTIVITAMIN WITH MINERALS) TABS tablet Take 1 tablet by mouth daily.    [provider]  ondansetron (ZOFRAN ODT) 8 MG disintegrating tablet Take 1 tablet (8 mg total) by mouth every 8 (eight) hours as needed for nausea or vomiting. 10/10/20   Florian Buff, MD  PACLITAXEL IV Inject into the vein every 21 ( twenty-one) days. 01/15/21   [provider]  prochlorperazine (COMPAZINE) 10 MG tablet Take 1 tablet (10 mg total) by mouth every 6 (six) hours as needed (Nausea or vomiting). 01/01/21   Derek Jack, MD  senna-docusate (SENOKOT-S) 8.6-50 MG tablet Take 2 tablets by mouth at bedtime. For AFTER surgery, do not take if having diarrhea 10/24/20   Everitt Amber, MD  traMADol (ULTRAM) 50 MG tablet Take 1 tablet (50 mg total) by mouth every 6 (six) hours as needed for severe pain. For AFTER surgery only, do not take and drive 0/98/11   Everitt Amber, MD  vitamin B-12 (CYANOCOBALAMIN) 500 MCG tablet Take 500 mcg by mouth daily.    [provider]     Family History  Problem Relation Age of Onset   Diabetes Mother    Hypertension Mother    Heart disease Mother        CHF   Stroke Mother    Heart disease Father    Colon cancer Brother    Breast cancer Neg Hx    Ovarian cancer Neg Hx    Endometrial cancer Neg Hx    Prostate cancer Neg Hx    Pancreatic cancer Neg Hx     Social History   Socioeconomic History   Marital status: Single    Spouse name: Not on file   Number of children: 2   Years of education: Not on file   Highest education level: High school graduate  Occupational History   Not on file  Tobacco Use   Smoking status: Former    Packs/day: 0.50    Years: 30.00    Pack years: 15.00    Types: Cigarettes    Quit date: 01/01/2020    Years since quitting: 1.0    Passive exposure: Never   Smokeless tobacco:  Never  Vaping Use   Vaping Use: Never used  Substance and Sexual Activity   Alcohol use: No   Drug use: Yes    Frequency: 6.0 times per week    Types: Marijuana    Comment: every now and then   Sexual activity: Not Currently    Birth control/protection: Surgical    Comment: tubal  Other Topics Concern   Not on file  Social History Narrative   Lives with daughter and 1 grandbaby   Son is in [place]      Enjoys: sitting outside, music, tv      Diet: eats all food groups   Caffeine: soda and tea: 2-3 cups daily   Water: 2-3 16 oz bottles      Wears seat belt    Does not use phone while driving   Smoke detectors at  home    No weapons    Social Determinants of Health   Financial Resource Strain: Not on file  Food Insecurity: Not on file  Transportation Needs: Not on file  Physical Activity: Not on file  Stress: Not on file  Social Connections: Not on file    Review of Systems: A 12 point ROS discussed and pertinent positives are indicated in the HPI above.  All other systems are negative.  Review of Systems  Constitutional:  Negative for appetite change and fatigue.  Respiratory:  Negative for cough and shortness of breath.   Cardiovascular:  Negative for chest pain and leg swelling.  Gastrointestinal:  Negative for abdominal pain, diarrhea and nausea.  Neurological:  Negative for dizziness and headaches.   Vital Signs: LMP 04/14/2010   Physical Exam Constitutional:      General: She is not in acute distress.    Appearance: She is obese.  HENT:     Mouth/Throat:     Mouth: Mucous membranes are moist.     Pharynx: Oropharynx is clear.  Cardiovascular:     Rate and Rhythm: Normal rate and regular rhythm.  Pulmonary:     Effort: Pulmonary effort is normal.     Breath sounds: Normal breath sounds.  Abdominal:     General: Bowel sounds are normal.     Palpations: Abdomen is soft.  Skin:    General: Skin is warm and dry.  Neurological:     Mental Status: She is  alert and oriented to person, place, and time.    Imaging: No results found.  Labs:  CBC: Recent Labs    10/04/20 1449 11/12/20 1444  WBC 5.4 5.0  HGB 12.2 11.6*  HCT 37.6 35.8*  PLT 226 238    COAGS: No results for input(s): INR, APTT in the last 8760 hours.  BMP: Recent Labs    10/04/20 1449 11/12/20 1444  NA 138 142  K 3.9 4.2  CL 104 110  CO2 27 28  GLUCOSE 93 91  BUN 11 12  CALCIUM 8.7* 8.5*  CREATININE 0.60 0.73  GFRNONAA >60 >60    LIVER FUNCTION TESTS: Recent Labs    10/04/20 1449 11/12/20 1444  BILITOT 0.6 0.5  AST 16 14*  ALT 11 11  ALKPHOS 68 71  PROT 6.9 6.9  ALBUMIN 3.6 3.4*    TUMOR MARKERS: No results for input(s): AFPTM, CEA, CA199, CHROMGRNA in the last 8760 hours.  Assessment and Plan:  Endometrial Cancer; pending chemotherapy: Michele Romero, 60 year old female, presents today to the [facility] department for an image-guided port-a-catheter placement.   Risks and benefits of image-guided port-a-catheter placement were discussed with the patient including, but not limited to bleeding, infection, pneumothorax, or fibrin sheath development and need for additional procedures.  All of the patient's questions were answered, patient is agreeable to proceed.  She has been NPO.   Consent signed and in chart.  Thank you for this interesting consult.  I greatly enjoyed meeting Michele Romero and look forward to participating in their care.  A copy of this report was sent to the requesting provider on this date.  Electronically Signed: Soyla Dryer, AGACNP-BC [phone] 01/08/2021, 2:18 PM   I spent a total of  30 Minutes   in face to face in clinical consultation, greater than 50% of which was counseling/coordinating care for port-a-catheter placement

## 2021-01-08 NOTE — Discharge Instructions (Signed)
For questions /concerns may call Interventional Radiology at [phone]  You may remove your dressing and shower tomorrow afternoon  DO NOT use EMLA cream for 2 weeks after port placement as the cream will remove surgical glue on your incision.     Moderate Conscious Sedation, Adult, Care After This sheet gives you information about how to care for yourself after your procedure. Your health care provider may also give you more specific instructions. If you have problems or questions, contact your health careprovider. What can I expect after the procedure? After the procedure, it is common to have: Sleepiness for several hours. Impaired judgment for several hours. Difficulty with balance. Vomiting if you eat too soon. Follow these instructions at home: For the time period you were told by your health care provider: Rest. Do not participate in activities where you could fall or become injured. Do not drive or use machinery. Do not drink alcohol. Do not take sleeping pills or medicines that cause drowsiness. Do not make important decisions or sign legal documents. Do not take care of children on your own. Eating and drinking  Follow the diet recommended by your health care provider. Drink enough fluid to keep your urine pale yellow. If you vomit: Drink water, juice, or soup when you can drink without vomiting. Make sure you have little or no nausea before eating solid foods.  General instructions Take over-the-counter and prescription medicines only as told by your health care provider. Have a responsible adult stay with you for the time you are told. It is important to have someone help care for you until you are awake and alert. Do not smoke. Keep all follow-up visits as told by your health care provider. This is important. Contact a health care provider if: You are still sleepy or having trouble with balance after 24 hours. You feel light-headed. You keep feeling nauseous or  you keep vomiting. You develop a rash. You have a fever. You have redness or swelling around the IV site. Get help right away if: You have trouble breathing. You have new-onset confusion at home. Summary After the procedure, it is common to feel sleepy, have impaired judgment, or feel nauseous if you eat too soon. Rest after you get home. Know the things you should not do after the procedure. Follow the diet recommended by your health care provider and drink enough fluid to keep your urine pale yellow. Get help right away if you have trouble breathing or new-onset confusion at home. This information is not intended to replace advice given to you by your health care provider. Make sure you discuss any questions you have with your healthcare provider.  Implanted Port Insertion, Care After This sheet gives you information about how to care for yourself after your procedure. Your health care provider may also give you more specific instructions. If you have problems or questions, contact your health careprovider. What can I expect after the procedure? After the procedure, it is common to have: Discomfort at the port insertion site. Bruising on the skin over the port. This should improve over 3-4 days. Follow these instructions at home: Port care After your port is placed, you will get a manufacturer's information card. The card has information about your port. Keep this card with you at all times. Take care of the port as told by your health care provider. Ask your health care provider if you or a family member can get training for taking care of the port at home. A   home health care nurse may also take care of the port. Make sure to remember what type of port you have. Incision care Follow instructions from your health care provider about how to take care of your port insertion site. Make sure you: Wash your hands with soap and water before and after you change your bandage (dressing). If soap  and water are not available, use hand sanitizer. Change your dressing as told by your health care provider. Leave skin glue, or adhesive strips in place. These skin closures may need to stay in place for 2 weeks or longer.  Check your port insertion site every day for signs of infection. Check for:      - Redness, swelling, or pain.                     - Fluid or blood.      - Warmth.      - Pus or a bad smell. Activity Return to your normal activities as told by your health care provider. Ask your health care provider what activities are safe for you. Do not lift anything that is heavier than 10 lb (4.5 kg), or the limit that you are told, until your health care provider says that it is safe. General instructions Take over-the-counter and prescription medicines only as told by your health care provider. Do not take baths, swim, or use a hot tub until your health care provider approves. Ask your health care provider if you may take showers. You may only be allowed to take sponge baths. Do not drive for 24 hours if you were given a sedative during your procedure. Wear a medical alert bracelet in case of an emergency. This will tell any health care providers that you have a port. Keep all follow-up visits as told by your health care provider. This is important. Contact a health care provider if: You cannot flush your port with saline as directed, or you cannot draw blood from the port. You have a fever or chills. You have redness, swelling, or pain around your port insertion site. You have fluid or blood coming from your port insertion site. Your port insertion site feels warm to the touch. You have pus or a bad smell coming from the port insertion site. Get help right away if: You have chest pain or shortness of breath. You have bleeding from your port that you cannot control. Summary Take care of the port as told by your health care provider. Keep the manufacturer's information card with  you at all times. Change your dressing as told by your health care provider. Contact a health care provider if you have a fever or chills or if you have redness, swelling, or pain around your port insertion site. Keep all follow-up visits as told by your health care provider. This information is not intended to replace advice given to you by your health care provider. Make sure you discuss any questions you have with your healthcare provider. Document Revised: 10/27/2017 Document Reviewed: 10/27/2017  

## 2021-01-08 NOTE — Procedures (Signed)
Interventional Radiology Procedure Note  Date of Procedure: 01/08/2021  Procedure: Port placement   Findings:  1. Successful placement of right chest port    Complications: No immediate complications noted.   Estimated Blood Loss: minimal  Follow-up and Recommendations: 1. Bedrest 1 hour  2. Ready for use    Albin Felling, MD  Vascular & Interventional Radiology  01/08/2021 3:49 PM

## 2021-01-08 NOTE — Patient Instructions (Addendum)
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  You will be treated in the clinic every 3 weeks for 6 cycles with a combination of chemotherapy drugs.  Those drugs are paclitaxel (Taxol) and carboplatin.  The intent of treatment is cure.  You will see the doctor regularly throughout treatment.  We will obtain blood work from you prior to every treatment and monitor your results to make sure it is safe to give your treatment. The doctor monitors your response to treatment by the way you are feeling, your blood work, and by obtaining scans periodically.  There will be wait times while you are here for treatment.  It will take about 30 minutes to 1 hour for your lab work to result.  Then there will be wait times while pharmacy mixes your medications.    Medications you will receive in the clinic prior to your chemotherapy medications:   Aloxi:  ALOXI is used in adults to help prevent the nausea and vomiting that happens with certain chemotherapy drugs.  Aloxi is a long acting medication, and will remain in your system for  about 2 days.   Emend:  This is an anti-nausea medication that is used with Aloxi to help prevent nausea and vomiting caused by chemotherapy.  Dexamethasone:  This is a steroid given prior to chemotherapy to help prevent allergic reactions; it may also help prevent and control nausea and diarrhea.     Dexamethasone:  This is a steroid given prior to chemotherapy to help prevent allergic reactions; it may also help prevent and control nausea and diarrhea.    Pepcid:  This medication is a histamine blocker that helps prevent and allergic reaction to your chemotherapy.    Benadryl:  This is a histamine blocker (different from the Pepcid) that helps prevent allergic/infusion reactions to your chemotherapy. This medication may cause dizziness/drowsiness.     Medication you will receive after treatment:  Neulasta (or  similar) - this is a bone marrow stimulant you will receive after chemotherapy to help your body produce neutrophils (a type of white blood cell).  These white blood cells help your body fight infections. You will receive this medication via injection under the skin in your belly at least 24 hours after receiving chemotherapy.  This injection may cause you to have body aches and bone pains where it is stimulating your bone marrow to produce these white blood cells at a rapid rate.  You may start taking over the counter Claritin 10 mg daily the day before receiving this injection and several days after to prevent or alleviate these body aches.    Paclitaxel (Taxol)  About This Drug Paclitaxel is a drug used to treat cancer. It is given in the vein (IV).  This will take 3 hours to infuse.  This first infusion will take longer to infuse because it is increased slowly to monitor for reactions.  The nurse will be in the room with you for the first 15 minutes of the first infusion.  Possible Side Effects   Hair loss. Hair loss is often temporary, although with certain medicine, hair loss can sometimes be permanent. Hair loss may happen suddenly or gradually. If you lose hair, you may lose it from your head, face, armpits, pubic area, chest, and/or legs. You may also notice your hair getting thin.   Swelling of your legs, ankles and/or feet (edema)   Flushing   Nausea and throwing up (vomiting)  Loose bowel movements (diarrhea)   Bone marrow depression. This is a decrease in the number of white blood cells, red blood cells, and platelets. This may raise your risk of infection, make you tired and weak (fatigue), and raise your risk of bleeding.   Effects on the nerves are called peripheral neuropathy. You may feel numbness, tingling, or pain in your hands and feet. It may be hard for you to button your clothes, open jars, or walk as usual. The effect on the nerves may get worse with more doses of the  drug. These effects get better in some people after the drug is stopped but it does not get better in all people.   Changes in your liver function   Bone, joint and muscle pain   Abnormal EKG   Allergic reaction: Allergic reactions, including anaphylaxis are rare but may happen in some patients. Signs of allergic reaction to this drug may be swelling of the face, feeling like your tongue or throat are swelling, trouble breathing, rash, itching, fever, chills, feeling dizzy, and/or feeling that your heart is beating in a fast or not normal way. If this happens, do not take another dose of this drug. You should get urgent medical treatment.   Infection   Changes in your kidney function.  Note: Each of the side effects above was reported in 20% or greater of patients treated with paclitaxel. Not all possible side effects are included above.  Warnings and Precautions   Severe allergic reactions   Severe bone marrow depression  Treating Side Effects   To help with hair loss, wash with a mild shampoo and avoid washing your hair every day.   Avoid rubbing your scalp, instead, pat your hair or scalp dry   Avoid coloring your hair   Limit your use of hair spray, electric curlers, blow dryers, and curling irons.   If you are interested in getting a wig, talk to your nurse. You can also call the [place] at [phone]-2345 to find out information about the "Look Good, Feel Better" program close to where you live. It is a free program where women getting chemotherapy can learn about wigs, turbans and scarves as well as makeup techniques and skin and nail care.   Ask your doctor or nurse about medicines that are available to help stop or lessen diarrhea and/or nausea.   To help with nausea and vomiting, eat small, frequent meals instead of three large meals a day. Choose foods and drinks that are at room temperature. Ask your nurse or doctor about other helpful tips and medicine  that is available to help or stop lessen these symptoms.   If you get diarrhea, eat low-fiber foods that are high in protein and calories and avoid foods that can irritate your digestive tracts or lead to cramping. Ask your nurse or doctor about medicine that can lessen or stop your diarrhea.   Mouth care is very important. Your mouth care should consist of routine, gentle cleaning of your teeth or dentures and rinsing your mouth with a mixture of 1/2 teaspoon of salt in 8 ounces of water or  teaspoon of baking soda in 8 ounces of water. This should be done at least after each meal and at bedtime.   If you have mouth sores, avoid mouthwash that has alcohol. Also avoid alcohol and smoking because they can bother your mouth and throat.   Drink plenty of fluids (a minimum of eight glasses per day is  recommended).   Take your temperature as your doctor or nurse tells you, and whenever you feel like you may have a fever.   Talk to your doctor or nurse about precautions you can take to avoid infections and bleeding.   Be careful when cooking, walking, and handling sharp objects and hot liquids.  Food and Drug Interactions   There are no known interactions of paclitaxel with food.   This drug may interact with other medicines. Tell your doctor and pharmacist about all the medicines and dietary supplements (vitamins, minerals, herbs and others) that you are taking at this time.   The safety and use of dietary supplements and alternative diets are often not known. Using these might affect your cancer or interfere with your treatment. Until more is known, you should not use dietary supplements or alternative diets without your cancer doctor's help.  When to Call the Doctor  Call your doctor or nurse if you have any of the following symptoms and/or any new or unusual symptoms:   Fever of 100.4 F (38 C) or above   Chills   Redness, pain, warmth, or swelling at the IV site during the  infusion   Signs of allergic reaction: swelling of the face, feeling like your tongue or throat are swelling, trouble breathing, rash, itching, fever, chills, feeling dizzy, and/or feeling that your heart is beating in a fast or not normal way   Feeling that your heart is beating in a fast or not normal way (palpitations)   Weight gain of 5 pounds in one week (fluid retention)   Decreased urine or very dark urine   Signs of liver problems: dark urine, pale bowel movements, bad stomach pain, feeling very tired and weak, unusual  itching, or yellowing of the eyes or skin   Heavy menstrual period that lasts longer than normal   Easy bruising or bleeding   Nausea that stops you from eating or drinking, and/or that is not relieved by prescribed medicines.   Loose bowel movements (diarrhea) more than 4 times a day or diarrhea with weakness or lightheadedness   Pain in your mouth or throat that makes it hard to eat or drink   Lasting loss of appetite or rapid weight loss of five pounds in a week   Signs of peripheral neuropathy: numbness, tingling, or decreased feeling in fingers or toes; trouble walking or changes in the way you walk; or feeling clumsy when buttoning clothes, opening jars, or other routine activities   Joint and muscle pain that is not relieved by prescribed medicines   Extreme fatigue that interferes with normal activities   While you are getting this drug, please tell your nurse right away if you have any pain, redness, or swelling at the site of the IV infusion.   If you think you are pregnant.  Reproduction Warnings   Pregnancy warning: This drug may have harmful effects on the unborn child, it is recommended that effective methods of birth control should be used during your cancer treatment. Let your doctor know right away if you think you may be pregnant.   Breast feeding warning: Women should not breast feed during treatment because this drug could enter the  breastmilk and cause harm to a breast feeding baby.   Carboplatin (Paraplatin, CBDCA)  About This Drug  Carboplatin is used to treat cancer. It is given in the vein (IV).  It will take 30 minutes to infuse.   Possible Side Effects   Bone  marrow suppression. This is a decrease in the number of white blood cells, red blood cells, and platelets. This may raise your risk of infection, make you tired and weak (fatigue), and raise your risk of bleeding.   Nausea and vomiting (throwing up)   Weakness   Changes in your liver function   Changes in your kidney function   Electrolyte changes   Pain  Note: Each of the side effects above was reported in 20% or greater of patients treated with carboplatin. Not all possible side effects are included above.   Warnings and Precautions   Severe bone marrow suppression   Allergic reactions, including anaphylaxis are rare but may happen in some patients. Signs of allergic reaction to this drug may be swelling of the face, feeling like your tongue or throat are swelling, trouble breathing, rash, itching, fever, chills, feeling dizzy, and/or feeling that your heart is beating in a fast or not normal way. If this happens, do not take another dose of this drug. You should get urgent medical treatment.   Severe nausea and vomiting   Effects on the nerves are called peripheral neuropathy. This risk is increased if you are over the age of 47 or if you have received other medicine with risk of peripheral neuropathy. You may feel numbness, tingling, or pain in your hands and feet. It may be hard for you to button your clothes, open jars, or walk as usual. The effect on the nerves may get worse with more doses of the drug. These effects get better in some people after the drug is stopped but it does not get better in all people.   Blurred vision, loss of vision or other changes in eyesight   Decreased hearing   - Skin and tissue irritation including  redness, pain, warmth, or swelling at the IV site if the drug leaks out of the vein and into nearby tissue.   Severe changes in your kidney function, which can cause kidney failure   Severe changes in your liver function, which can cause liver failure  Note: Some of the side effects above are very rare. If you have concerns and/or questions, please discuss them with your medical team.   Important Information   This drug may be present in the saliva, tears, sweat, urine, stool, vomit, semen, and vaginal secretions. Talk to your doctor and/or your nurse about the necessary precautions to take during this time.   Treating Side Effects   Manage tiredness by pacing your activities for the day.   Be sure to include periods of rest between energy-draining activities.   To decrease the risk of infection, wash your hands regularly.   Avoid close contact with people who have a cold, the flu, or other infections.   Take your temperature as your doctor or nurse tells you, and whenever you feel like you may have a fever.   To help decrease the risk of bleeding, use a soft toothbrush. Check with your nurse before using dental floss.   Be very careful when using knives or tools.   Use an electric shaver instead of a razor.   Drink plenty of fluids (a minimum of eight glasses per day is recommended).   If you throw up or have loose bowel movements, you should drink more fluids so that you do not become dehydrated (lack of water in the body from losing too much fluid).   To help with nausea and vomiting, eat small, frequent meals instead of  three large meals a day. Choose foods and drinks that are at room temperature. Ask your nurse or doctor about other helpful tips and medicine that is available to help stop or lessen these symptoms.   If you have numbness and tingling in your hands and feet, be careful when cooking, walking, and handling sharp objects and hot liquids.   Keeping your pain  under control is important to your well-being. Please tell your doctor or nurse if you are experiencing pain.   Food and Drug Interactions   There are no known interactions of carboplatin with food.   This drug may interact with other medicines. Tell your doctor and pharmacist about all the prescription and over-the-counter medicines and dietary supplements (vitamins, minerals, herbs and others) that you are taking at this time. Also, check with your doctor or pharmacist before starting any new prescription or over-the-counter medicines, or dietary supplements to make sure that there are no interactions.   When to Call the Doctor  Call your doctor or nurse if you have any of these symptoms and/or any new or unusual symptoms:   Fever of 100.4 F (38 C) or higher   Chills   Tiredness that interferes with your daily activities   Feeling dizzy or lightheaded   Easy bleeding or bruising   Nausea that stops you from eating or drinking and/or is not relieved by prescribed medicines   Throwing up   Blurred vision or other changes in eyesight   Decrease in hearing or ringing in the ear   Signs of allergic reaction: swelling of the face, feeling like your tongue or throat are swelling, trouble breathing, rash, itching, fever, chills, feeling dizzy, and/or feeling that your heart is beating in a fast or not normal way. If this happens, call 911 for emergency care.   Signs of possible liver problems: dark urine, pale bowel movements, bad stomach pain, feeling very tired and weak, unusual itching, or yellowing of the eyes or skin   Decreased urine, or very dark urine   Numbness, tingling, or pain in your hands and feet   Pain that does not go away or is not relieved by prescribed medicine   While you are getting this drug, please tell your nurse right away if you have any pain, redness, or swelling at the site of the IV infusion, or if you have any new onset of symptoms, or if you just  feel "different" from before when the infusion was started.   Reproduction Warnings   Pregnancy warning: This drug may have harmful effects on the unborn baby. Women of child bearing potential should use effective methods of birth control during your cancer treatment. Let your doctor know right away if you think you may be pregnant.   Breastfeeding warning: It is not known if this drug passes into breast milk. For this reason, women should not breastfeed during treatment because this drug could enter the breast milk and cause harm to a breastfeeding baby.   Fertility warning: Human fertility studies have not been done with this drug. Talk with your doctor or nurse if you plan to have children. Ask for information on sperm or egg banking.   SELF CARE ACTIVITIES WHILE RECEIVING CHEMOTHERAPY:  Hydration Increase your fluid intake 48 hours prior to treatment and drink at least 8 to 12 cups (64 ounces) of water/decaffeinated beverages per day after treatment. You can still have your cup of coffee or soda but these beverages do not count as part  of your 8 to 12 cups that you need to drink daily. No alcohol intake.  Medications Continue taking your normal prescription medication as prescribed.  If you start any new herbal or new supplements please let us know first to make sure it is safe.  Mouth Care Have teeth cleaned professionally before starting treatment. Keep dentures and partial plates clean. Use soft toothbrush and do not use mouthwashes that contain alcohol. Biotene is a good mouthwash that is available at most pharmacies or may be ordered by calling [phone]. Use warm salt water gargles (1 teaspoon salt per 1 quart warm water) before and after meals and at bedtime. If you need dental work, please let the doctor know before you go for your appointment so that we can coordinate the best possible time for you in regards to your chemo regimen. You need to also let your dentist know that  you are actively taking chemo. We may need to do labs prior to your dental appointment.  Skin Care Always use sunscreen that has not expired and with SPF (Sun Protection Factor) of 50 or higher. Wear hats to protect your head from the sun. Remember to use sunscreen on your hands, ears, face, & feet.  Use good moisturizing lotions such as udder cream, eucerin, or even Vaseline. Some chemotherapies can cause dry skin, color changes in your skin and nails.    Avoid long, hot showers or baths. Use gentle, fragrance-free soaps and laundry detergent. Use moisturizers, preferably creams or ointments rather than lotions because the thicker consistency is better at preventing skin dehydration. Apply the cream or ointment within 15 minutes of showering. Reapply moisturizer at night, and moisturize your hands every time after you wash them.  Hair Loss (if your doctor says your hair will fall out)  If your doctor says that your hair is likely to fall out, decide before you begin chemo whether you want to wear a wig. You may want to shop before treatment to match your hair color. Hats, turbans, and scarves can also camouflage hair loss, although some people prefer to leave their heads uncovered. If you go bare-headed outdoors, be sure to use sunscreen on your scalp. Cut your hair short. It eases the inconvenience of shedding lots of hair, but it also can reduce the emotional impact of watching your hair fall out. Don't perm or color your hair during chemotherapy. Those chemical treatments are already damaging to hair and can enhance hair loss. Once your chemo treatments are done and your hair has grown back, it's OK to resume dyeing or perming hair.  With chemotherapy, hair loss is almost always temporary. But when it grows back, it may be a different color or texture. In older adults who still had hair color before chemotherapy, the new growth may be completely gray.  Often, new hair is very fine and  soft.  Infection Prevention Please wash your hands for at least 30 seconds using warm soapy water. Handwashing is the #1 way to prevent the spread of germs. Stay away from sick people or people who are getting over a cold. If you develop respiratory systems such as green/yellow mucus production or productive cough or persistent cough let us know and we will see if you need an antibiotic. It is a good idea to keep a pair of gloves on when going into grocery stores/[employer] to decrease your risk of coming into contact with germs on the carts, etc. Carry alcohol hand gel with you at all  times and use it frequently if out in public. If your temperature reaches 100.4 or higher please call the clinic and let us know.  If it is after hours or on the weekend please go to the ER if your temperature is over 100.4.  Please have your own personal thermometer at home to use.    Sex and bodily fluids If you are going to have sex, a condom must be used to protect the person that isn't taking chemotherapy. Chemo can decrease your libido (sex drive). For a few days after chemotherapy, chemotherapy can be excreted through your bodily fluids.  When using the toilet please close the lid and flush the toilet twice.  Do this for a few day after you have had chemotherapy.   Effects of chemotherapy on your sex life Some changes are simple and won't last long. They won't affect your sex life permanently.  Sometimes you may feel: too tired not strong enough to be very active sick or sore  not in the mood anxious or low  Your anxiety might not seem related to sex. For example, you may be worried about the cancer and how your treatment is going. Or you may be worried about money, or about how you family are coping with your illness.  These things can cause stress, which can affect your interest in sex. It's important to talk to your partner about how you feel.  Remember - the changes to your sex life don't usually last long.  There's usually no medical reason to stop having sex during chemo. The drugs won't have any long term physical effects on your performance or enjoyment of sex. Cancer can't be passed on to your partner during sex  Contraception It's important to use reliable contraception during treatment. Avoid getting pregnant while you or your partner are having chemotherapy. This is because the drugs may harm the baby. Sometimes chemotherapy drugs can leave a man or woman infertile.  This means you would not be able to have children in the future. You might want to talk to someone about permanent infertility. It can be very difficult to learn that you may no longer be able to have children. Some people find counselling helpful. There might be ways to preserve your fertility, although this is easier for men than for women. You may want to speak to a fertility expert. You can talk about sperm banking or harvesting your eggs. You can also ask about other fertility options, such as donor eggs. If you have or have had breast cancer, your doctor might advise you not to take the contraceptive pill. This is because the hormones in it might affect the cancer. It is not known for sure whether or not chemotherapy drugs can be passed on through semen or secretions from the vagina. Because of this some doctors advise people to use a barrier method if you have sex during treatment. This applies to vaginal, anal or oral sex. Generally, doctors advise a barrier method only for the time you are actually having the treatment and for about a week after your treatment. Advice like this can be worrying, but this does not mean that you have to avoid being intimate with your partner. You can still have close contact with your partner and continue to enjoy sex.  Animals If you have cats or birds we just ask that you not change the litter or change the cage.  Please have someone else do this for you while you are on  chemotherapy.   Food Safety  During and After Cancer Treatment Food safety is important for people both during and after cancer treatment. Cancer and cancer treatments, such as chemotherapy, radiation therapy, and stem cell/bone marrow transplantation, often weaken the immune system. This makes it harder for your body to protect itself from foodborne illness, also called food poisoning. Foodborne illness is caused by eating food that contains harmful bacteria, parasites, or viruses.  Foods to avoid Some foods have a higher risk of becoming tainted with bacteria. These include: Unwashed fresh fruit and vegetables, especially leafy vegetables that can hide dirt and other contaminants Raw sprouts, such as alfalfa sprouts Raw or undercooked beef, especially ground beef, or other raw or undercooked meat and poultry Fatty, fried, or spicy foods immediately before or after treatment.  These can sit heavy on your stomach and make you feel nauseous. Raw or undercooked shellfish, such as oysters. Sushi and sashimi, which often contain raw fish.  Unpasteurized beverages, such as unpasteurized fruit juices, raw milk, raw yogurt, or cider Undercooked eggs, such as soft boiled, over easy, and poached; raw, unpasteurized eggs; or foods made with raw egg, such as homemade raw cookie dough and homemade mayonnaise  Simple steps for food safety  Shop smart. Do not buy food stored or displayed in an unclean area. Do not buy bruised or damaged fruits or vegetables. Do not buy cans that have cracks, dents, or bulges. Pick up foods that can spoil at the end of your shopping trip and store them in a cooler on the way home.  Prepare and clean up foods carefully. Rinse all fresh fruits and vegetables under running water, and dry them with a clean towel or paper towel. Clean the top of cans before opening them. After preparing food, wash your hands for 20 seconds with hot water and soap. Pay special attention to areas between fingers and under  nails. Clean your utensils and dishes with hot water and soap. Disinfect your kitchen and cutting boards using 1 teaspoon of liquid, unscented bleach mixed into 1 quart of water.    Dispose of old food. Eat canned and packaged food before its expiration date (the "use by" or "best before" date). Consume refrigerated leftovers within 3 to 4 days. After that time, throw out the food. Even if the food does not smell or look spoiled, it still may be unsafe. Some bacteria, such as Listeria, can grow even on foods stored in the refrigerator if they are kept for too long.  Take precautions when eating out. At restaurants, avoid buffets and salad bars where food sits out for a long time and comes in contact with many people. Food can become contaminated when someone with a virus, often a norovirus, or another "bug" handles it. Put any leftover food in a "to-go" container yourself, rather than having the server do it. And, refrigerate leftovers as soon as you get home. Choose restaurants that are clean and that are willing to prepare your food as you order it cooked.   AT HOME MEDICATIONS:  Compazine/Prochlorperazine 10mg  tablet. Take 1 tablet every 6 hours as needed for nausea/vomiting. (This can make you sleepy)   EMLA cream. Apply a quarter size amount to port site 1 hour prior to chemo. Do not rub in. Cover with plastic wrap.    Diarrhea Sheet   If you are having loose stools/diarrhea, please purchase Imodium and begin taking as outlined:  At the first sign of poorly formed or loose stools you should begin taking Imodium (loperamide) 2 mg capsules.  Take two tablets (4mg ) followed by one tablet (2mg ) every 2 hours - DO NOT EXCEED 8 tablets in 24 hours.  If it is bedtime and you are having loose stools, take 2 tablets at bedtime, then 2  tablets every 4 hours until morning.   Always call the [facility] if you are having loose stools/diarrhea that you can't get under control.  Loose stools/diarrhea leads to dehydration (loss of water) in your body.  We have other options of trying to get the loose stools/diarrhea to stop but you must let us know!   Constipation Sheet  Colace - 100 mg capsules - take 2 capsules daily.  If this doesn't help then you can increase to 2 capsules twice daily.  Please call if the above does not work for you. Do not go more than 2 days without a bowel movement.  It is very important that you do not become constipated.  It will make you feel sick to your stomach (nausea) and can cause abdominal pain and vomiting.  Nausea Sheet   Compazine/Prochlorperazine 10mg  tablet. Take 1 tablet every 6 hours as needed for nausea/vomiting (This can make you drowsy).  If you are having persistent nausea (nausea that does not stop) please call the [facility] and let us know the amount of nausea that you are experiencing.  If you begin to vomit, you need to call the [facility] and if it is the weekend and you have vomited more than one time and can't get it to stop-go to the Emergency Room.  Persistent nausea/vomiting can lead to dehydration (loss of fluid in your body) and will make you feel very weak and unwell. Ice chips, sips of clear liquids, foods that are at room temperature, crackers, and toast tend to be better tolerated.   SYMPTOMS TO REPORT AS SOON AS POSSIBLE AFTER TREATMENT:  FEVER GREATER THAN 100.4 F  CHILLS WITH OR WITHOUT FEVER  NAUSEA AND VOMITING THAT IS NOT CONTROLLED WITH YOUR NAUSEA MEDICATION  UNUSUAL SHORTNESS OF BREATH  UNUSUAL BRUISING OR BLEEDING  TENDERNESS IN MOUTH AND THROAT WITH OR WITHOUT PRESENCE OF ULCERS  URINARY PROBLEMS  BOWEL PROBLEMS  UNUSUAL RASH      Wear comfortable clothing and clothing appropriate for easy access to any Portacath or PICC line. Let us  know if there is anything that we can do to make your therapy better!    What to do if you need assistance after hours or on the weekends: CALL [phone].  HOLD on the line, do not hang up.  You will hear multiple messages but at the end you will be connected with a nurse triage line.  They will contact the doctor if necessary.  Most of the time they will be able to assist you.  Do not call the hospital operator.      I have been informed and understand all of the instructions given to me and have received a copy. I have been instructed to call the clinic (  [phone] or my family physician as soon as possible for continued medical care, if indicated. I do not have any more questions at this time but understand that I may call the [facility] or the Patient Navigator at [phone] during office hours should I have questions or need assistance in obtaining follow-up care.

## 2021-01-09 ENCOUNTER — Ambulatory Visit: Payer: 59 | Admitting: Nurse Practitioner

## 2021-01-09 NOTE — Progress Notes (Signed)
GYN Location of Tumor / Histology: Endometrial  Michele Romero presented with symptoms of: symptoms began in May, 2022 with postmenopausal bleeding  Biopsies revealed: ENDOMETRIUM, BIOPSY:  -  Predominantly blood and inflammatory debris with detached fragments  of metaplastic squamous and endocervical glandular epithelium  -  Definitive endometrium not identified ENDOMETRIM, CURETTAGE:  - Carcinosarcoma (malignant mixed Mullerian tumor),    Past/Anticipated interventions by Gyn/Onc surgery, if any:  10/10/2020 Procedure: Operative hysteroscopy with removal of  polyp, endometrial curettage  Surgeon: Florian Buff   11/15/2020 Operation: Robotic-assisted laparoscopic total hysterectomy >250gm, with bilateral salpingoophorectomy, SLN biopsy   Surgeon: Donaciano Eva  Past/Anticipated interventions by medical oncology, if any: Dr. Derek Jack 6 cycles of adjuvant chemotherapy with carboplatin and paclitaxel followed by vaginal brachytherapy  Weight changes, if any: yes, weight fluctuates  Bowel/Bladder complaints, if any: Yes.  ,  urinary frequency, burning,  nocturia x3-4 Nausea/Vomiting, if any: no  Pain issues, if any:  yes, 8/10 pain in right hip described as intermittent sharp ache  SAFETY ISSUES: Prior radiation? no Pacemaker/ICD? no Possible current pregnancy? no, hysterectomy Is the patient on methotrexate? no  Current Complaints / other details:  none  Vitals:   01/17/21 1459  BP: 128/69  Pulse: 92  Resp: 18  Temp: (!) 97 F (36.1 C)  SpO2: 100%  Weight: 209 lb 6.4 oz (95 kg)

## 2021-01-11 ENCOUNTER — Ambulatory Visit (HOSPITAL_COMMUNITY): Payer: 59

## 2021-01-14 ENCOUNTER — Institutional Professional Consult (permissible substitution): Payer: 59 | Admitting: Radiation Oncology

## 2021-01-14 ENCOUNTER — Inpatient Hospital Stay (HOSPITAL_COMMUNITY): Payer: 59

## 2021-01-14 ENCOUNTER — Inpatient Hospital Stay (HOSPITAL_COMMUNITY): Payer: 59 | Attending: Hematology

## 2021-01-14 DIAGNOSIS — C541 Malignant neoplasm of endometrium: Secondary | ICD-10-CM | POA: Insufficient documentation

## 2021-01-14 DIAGNOSIS — Z5111 Encounter for antineoplastic chemotherapy: Secondary | ICD-10-CM | POA: Insufficient documentation

## 2021-01-14 DIAGNOSIS — D649 Anemia, unspecified: Secondary | ICD-10-CM | POA: Insufficient documentation

## 2021-01-16 ENCOUNTER — Other Ambulatory Visit: Payer: Self-pay

## 2021-01-16 ENCOUNTER — Inpatient Hospital Stay (HOSPITAL_COMMUNITY): Payer: 59

## 2021-01-16 NOTE — Progress Notes (Signed)

## 2021-01-16 NOTE — Progress Notes (Signed)
Radiation Oncology         [phone] ________________________________  Initial Outpatient Consultation  Name: Michele Romero MRN: [id number]  Date: 01/17/2021  DOB: [birth year]  UV:OZDG, Trinna Balloon, NP  Everitt Amber, MD   REFERRING PHYSICIAN: Everitt Amber, MD  DIAGNOSIS: The encounter diagnosis was Endometrial cancer [facility]).  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   HISTORY OF PRESENT ILLNESS::Michele Romero is a 60 y.o. female who is accompanied by no one. she is seen as a courtesy of Dr. Denman George for an opinion concerning radiation therapy as part of management for her recently diagnosed endometrial cancer. The patient presented in May of 2022 with postmenopausal bleeding. She had not seen her primary gynecologist, Dr. Glo Herring, for more than 10 years and accordingly met with Dr. Elonda Husky for evaluation on 09/13/20. Transvaginal US performed on 09/14/2020 demonstrated an enlarged uterus measuring 11.3 x 8.4 x 7.5 cm, with 2 fibroids; one in the anterior right uterus measuring 6 cm, and the other in the left uterus measuring 4 cm.  The endometrium was also seen to be thickened to 19 mm. Endometrial biopsy performed on 09/13/20 by Dr. Elonda Husky revealed predominantly blood with no endometrium identified..   Pelvis US performed on 09/18/20 revealed two uterine leiomyomata, with the larger of the two measuring 6.2 cm at the anterior fundus. Thicken abnormal heterogeneous endometrial complex was additionally noted in the setting of postmenopausal bleeding.  Accordingly, the patient underwent hysteroscopy with D&C on 10/10/20. Pathology from the procedure revealed: carcinosarcoma.   Subsequently, the patient was referred to Dr. Denman George on 10/24/20 for discussion of surgical interventions. CT of the abdomen and pelvis performed on 10/29/20 demonstrated the known endometrial mass, with no compelling findings of metastatic disease in the abdomen/pelvis.The patient opted to proceed with total  hysterectomy, with salpingo oophorectomy and sentinel node biopsies on 11/15/20 under the care of Dr. Denman George. Pathology from the procedure revealed carcinosarcoma spanning 8.5 cm; with tumor invading less than one half of the myometrium with lymphovascular invasion, leiomyomata, and adenomyosis present. No evidence of malignancy was seen in the cervix, fallopian tubes, or ovaries. Right sentinel node biopsy revealed micrometastatic tumor in one of one lymph node. Left obturator lymph node biopsy was negative for tumor.   The patient soon after followed up with Dr. Denman George on 12/07/20. During this visit, Dr. Denman George recommended for the patient to undergo 6 cycles of adjuvant chemotherapy with platinum, taxane, and vaginal brachytherapy.   The patient is scheduled to begin her adjuvant chemotherapy next week of the direction of Dr. Delton Coombes in [place].  6 cycles are planned     PREVIOUS RADIATION THERAPY: No  PAST MEDICAL HISTORY:  Past Medical History:  Diagnosis Date   Ambulates with cane 10/31/2020   Arthritis    RIGHT HIP   Asthma    Cigarette nicotine dependence without complication 64/40/3474   Complex tear of medial meniscus of right knee 01/20/2017   COPD (chronic obstructive pulmonary disease) ([place])    ENDOMETRIAL CANCER 10/31/2020   Endometrial cancer ([place]) 11/15/2020   GERD (gastroesophageal reflux disease)    Migraines    Port-A-Cath in place 01/01/2021    PAST SURGICAL HISTORY: Past Surgical History:  Procedure Laterality Date   APPENDECTOMY     YRS AGO PER PT ON 10-31-2020   BIOPSY  11/29/2019   Procedure: BIOPSY;  Surgeon: Harvel Quale, MD;  [facility];  Service: Gastroenterology;;  ileocecal vlve   CHOLECYSTECTOMY  YRS AGO PER PT ON 10-31-2020   COLONOSCOPY WITH PROPOFOL N/A 11/29/2019   Procedure: COLONOSCOPY WITH PROPOFOL;  Surgeon: Harvel Quale, MD;  Location: [facility];  Service: Gastroenterology;  Laterality: N/A;  930    HYSTEROSCOPY WITH D & C N/A 10/10/2020   Procedure: DILATATION AND CURETTAGE /HYSTEROSCOPY;  Surgeon: Florian Buff, MD;  Location: [facility];  Service: Gynecology;  Laterality: N/A;   IR IMAGING GUIDED PORT INSERTION  01/08/2021   KNEE ARTHROPLASTY  2018   left knee   POLYPECTOMY  11/29/2019   Procedure: POLYPECTOMY;  Surgeon: Montez Morita, Quillian Quince, MD;  Location[facility];  Service: Gastroenterology;;  sigmoid colon    ROBOTIC ASSISTED TOTAL HYSTERECTOMY WITH BILATERAL SALPINGO OOPHERECTOMY N/A 11/15/2020   Procedure: XI ROBOTIC ASSISTED TOTAL HYSTERECTOMY GREATER THAN TWO HUNDRED AND FIFTY GRAMS WITH BILATERAL SALPINGO OOPHORECTOMY, MINI LAPAROTOMY;  Surgeon: Everitt Amber, MD;  Location: [facility];  Service: Gynecology;  Laterality: N/A;   SENTINEL NODE BIOPSY N/A 11/15/2020   Procedure: SENTINEL NODE BIOPSY;  Surgeon: Everitt Amber, MD;  [facility];  Service: Gynecology;  Laterality: N/A;   TUBAL LIGATION  04/14/1985    FAMILY HISTORY:  Family History  Problem Relation Age of Onset   Diabetes Mother    Hypertension Mother    Heart disease Mother        CHF   Stroke Mother    Heart disease Father    Colon cancer Brother    Breast cancer Neg Hx    Ovarian cancer Neg Hx    Endometrial cancer Neg Hx    Prostate cancer Neg Hx    Pancreatic cancer Neg Hx     SOCIAL HISTORY:  Social History   Tobacco Use   Smoking status: Former    Packs/day: 0.50    Years: 30.00    Pack years: 15.00    Types: Cigarettes    Quit date: 01/01/2020    Years since quitting: 1.0    Passive exposure: Never   Smokeless tobacco: Never  Vaping Use   Vaping Use: Never used  Substance Use Topics   Alcohol use: No   Drug use: Yes    Frequency: 6.0 times per week    Types: Marijuana    Comment: every now and then    ALLERGIES:  Allergies  Allergen Reactions   Codeine Nausea And Vomiting and Rash    MEDICATIONS:  Current Outpatient Medications   Medication Sig Dispense Refill   albuterol (VENTOLIN HFA) 108 (90 Base) MCG/ACT inhaler Inhale 2 puffs into the lungs every 6 (six) hours as needed for wheezing or shortness of breath. 8 g 2   budesonide-formoterol (SYMBICORT) 80-4.5 MCG/ACT inhaler Inhale 2 puffs into the lungs 2 (two) times daily. 10.2 g 2   calcium carbonate (TUMS - DOSED IN MG ELEMENTAL CALCIUM) 500 MG chewable tablet Chew 1 tablet by mouth as needed for indigestion or heartburn.     diphenhydrAMINE (BENADRYL) 25 MG tablet Take 25 mg by mouth daily as needed for allergies.     ibuprofen (ADVIL) 800 MG tablet Take 1 tablet (800 mg total) by mouth every 8 (eight) hours as needed for moderate pain. For AFTER surgery only 30 tablet 0   Multiple Vitamin (MULTIVITAMIN WITH MINERALS) TABS tablet Take 1 tablet by mouth daily.     vitamin B-12 (CYANOCOBALAMIN) 500 MCG tablet Take 500 mcg by mouth daily.     acetaminophen (TYLENOL) 500 MG tablet Take 1,000  mg by mouth every 6 (six) hours as needed for moderate pain or headache. (Patient not taking: Reported on 01/17/2021)     CARBOPLATIN IV Inject into the vein every 21 ( twenty-one) days. (Patient not taking: Reported on 01/17/2021)     lidocaine-prilocaine (EMLA) cream Apply a small amount to port a cath site and cover with plastic wrap 1 hour prior to infusion appointments (Patient not taking: Reported on 01/17/2021) 30 g 3   ondansetron (ZOFRAN ODT) 8 MG disintegrating tablet Take 1 tablet (8 mg total) by mouth every 8 (eight) hours as needed for nausea or vomiting. (Patient not taking: Reported on 01/17/2021) 8 tablet 0   PACLITAXEL IV Inject into the vein every 21 ( twenty-one) days. (Patient not taking: Reported on 01/17/2021)     Pegfilgrastim-cbqv (UDENYCA [place]) Inject into the skin every 21 ( twenty-one) days. Administer on Day 3 of each chemotherapy cycle (Patient not taking: Reported on 01/17/2021)     prochlorperazine (COMPAZINE) 10 MG tablet Take 1 tablet (10 mg total) by mouth every  6 (six) hours as needed (Nausea or vomiting). (Patient not taking: Reported on 01/17/2021) 30 tablet 1   senna-docusate (SENOKOT-S) 8.6-50 MG tablet Take 2 tablets by mouth at bedtime. For AFTER surgery, do not take if having diarrhea (Patient not taking: Reported on 01/17/2021) 30 tablet 0   traMADol (ULTRAM) 50 MG tablet Take 1 tablet (50 mg total) by mouth every 6 (six) hours as needed for severe pain. For AFTER surgery only, do not take and drive (Patient not taking: Reported on 01/17/2021) 10 tablet 0   No current facility-administered medications for this encounter.    REVIEW OF SYSTEMS:  A 10+ POINT REVIEW OF SYSTEMS WAS OBTAINED including neurology, dermatology, psychiatry, cardiac, respiratory, lymph, extremities, GI, GU, musculoskeletal, constitutional, reproductive, HEENT.  She denies any pelvic pain abdominal bloating vaginal bleeding or discharge.  Denies any urinary symptoms or hematuria.  She denies any rectal bleeding.    PHYSICAL EXAM:  weight is 209 lb 6.4 oz (95 kg). Her temperature is 97 F (36.1 C) (abnormal). Her blood pressure is 128/69 and her pulse is 92. Her respiration is 18 and oxygen saturation is 100%.   General: Alert and oriented, in no acute distress, she has a walker to use for ambulation HEENT: Head is normocephalic. Extraocular movements are intact.  Neck: Neck is supple, no palpable cervical or supraclavicular lymphadenopathy. Heart: Regular in rate and rhythm with no murmurs, rubs, or gallops. Chest: Clear to auscultation bilaterally, with no rhonchi, wheezes, or rales. Abdomen: Soft, nontender, nondistended, with no rigidity or guarding. Extremities: No cyanosis or edema. Lymphatics: see Neck Exam Skin: No concerning lesions. Musculoskeletal: symmetric strength and muscle tone throughout. Neurologic: Cranial nerves II through XII are grossly intact. No obvious focalities. Speech is fluent. Coordination is intact. Psychiatric: Judgment and insight are intact.  Affect is appropriate. Pelvic exam deferred today.  We will performed at the time of her brachytherapy planning and treatment.     ECOG = 2  0 - Asymptomatic (Fully active, able to carry on all predisease activities without restriction)  1 - Symptomatic but completely ambulatory (Restricted in physically strenuous activity but ambulatory and able to carry out work of a light or sedentary nature. For example, light housework, office work)  2 - Symptomatic, <50% in bed during the day (Ambulatory and capable of all self care but unable to carry out any work activities. Up and about more than 50% of waking hours)  3 -  Symptomatic, >50% in bed, but not bedbound (Capable of only limited self-care, confined to bed or chair 50% or more of waking hours)  4 - Bedbound (Completely disabled. Cannot carry on any self-care. Totally confined to bed or chair)  5 - Death   Eustace Pen MM, Creech RH, Tormey DC, et al. [phone]). "Toxicity and response criteria of the [facility]". [place] Oncol. 5 (6): 649-55  LABORATORY DATA:  Lab Results  Component Value Date   WBC 5.0 11/12/2020   HGB 11.6 (L) 11/12/2020   HCT 35.8 (L) 11/12/2020   MCV 93.0 11/12/2020   PLT 238 11/12/2020   NEUTROABS 7.5 06/23/2013   Lab Results  Component Value Date   NA 142 11/12/2020   K 4.2 11/12/2020   CL 110 11/12/2020   CO2 28 11/12/2020   GLUCOSE 91 11/12/2020   CREATININE 0.73 11/12/2020   CALCIUM 8.5 (L) 11/12/2020      RADIOGRAPHY: IR IMAGING GUIDED PORT INSERTION  Result Date: 01/08/2021 INDICATION: Chemotherapy EXAM: IMPLANTED PORT A CATH PLACEMENT WITH ULTRASOUND AND FLUOROSCOPIC GUIDANCE MEDICATIONS: None ANESTHESIA/SEDATION: Moderate (conscious) sedation was employed during this procedure. A total of Versed 4 mg and Fentanyl 100 mcg was administered intravenously. Moderate Sedation Time: 36 minutes. The patient's level of consciousness and vital signs were monitored continuously by  radiology nursing throughout the procedure under my direct supervision. FLUOROSCOPY TIME:  42 seconds with 2 exposures COMPLICATIONS: None immediate. PROCEDURE: The procedure, risks, benefits, and alternatives were explained to the patient. Questions regarding the procedure were encouraged and answered. The patient understands and consents to the procedure. Maximum barrier sterile technique with sterile gowns and gloves were used for the procedure. A timeout was performed prior to the initiation of the procedure. The patient was placed supine on the exam table. The right neck and chest was prepped and draped in the standard sterile fashion. A preliminary ultrasound of the right neck was performed and demonstrates a patent right internal jugular vein. A permanent ultrasound image was stored. The overlying skin was anesthetized with 1% Lidocaine. Using ultrasound guidance, access was obtained into the right internal jugular vein using a 21 gauge micropuncture set. A wire was advanced into the SVC, a short incision was made at the puncture site, and serial dilatation performed. Next, in an ipsilateral infraclavicular location, an incision was made at the site of the subcutaneous reservoir. Blunt dissection was used to open a pocket to contain the reservoir. A subcutaneous tunnel was then created from the port site to the puncture site. A(n) 8 Fr single lumen catheter was advanced through the tunnel. The catheter was attached to the port and this was placed in the subcutaneous pocket. Under fluoroscopic guidance, a peel away sheath was placed, and the catheter was trimmed to the appropriate length and was advanced into the central veins. The tip of the catheter lies near the superior cavoatrial junction. The port flushes and aspirates appropriately. The port was flushed and locked with heparinized saline. The port pocket was closed in 2 layers using 3-0 and 4-0 Vicryl/absorbable suture. The venotomy incision was also  closed with 4-0 Vicryl. Dermabond was applied to both incisions. The patient tolerated the procedure well and was transferred to recovery in stable condition. IMPRESSION: Successful placement of a right chest port using the right internal jugular vein. The port is ready for immediate use. Electronically Signed   By: Albin Felling M.D.   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  Adjuvant chemotherapy as well as adjuvant vaginal brachytherapy is recommended to reduce the chances of recurrence.  The patient's case was presented at the multidisciplinary gynecologic oncology conference and recommendations were as above.  In light of the patient's histology and lymphovascular space invasion as well as stage she is at significant risk for vaginal cuff recurrence and brachytherapy will help reduce these chances of recurrence.  Today, I talked to the patient  about the findings and work-up thus far.  We discussed the natural history of carcinosarcoma of the uterus and general treatment, highlighting the role of radiotherapy in the management.  We discussed the available radiation techniques, and focused on the details of logistics and delivery.  We reviewed the anticipated acute and late sequelae associated with radiation in this setting.  The patient was encouraged to ask questions that I answered to the best of my ability.  A patient consent form was discussed and signed.  We retained a copy for our records.  The patient would like to proceed with radiation and will be scheduled for CT simulation.  PLAN: Patient will proceed with adjuvant chemotherapy as above.  She will then be seen  after her second or third cycle of chemotherapy to begin brachytherapy treatments.  If patient is not tolerating her chemotherapy well then would consider sequencing her vaginal brachytherapy at the  completion of her chemotherapy treatments.  Anticipate 5 high-dose-rate treatments directed at the vaginal cuff using iridium 192 as the high-dose-rate source.   60 minutes of total time was spent for this patient encounter, including preparation, face-to-face counseling with the patient and coordination of care, physical exam, and documentation of the encounter.   ------------------------------------------------  Blair Promise, PhD, MD  This document serves as a record of services personally performed by Gery Pray, MD. It was created on his behalf by Roney Mans, a trained medical scribe. The creation of this record is based on the scribe's personal observations and the provider's statements to them. This document has been checked and approved by the attending provider.

## 2021-01-17 ENCOUNTER — Encounter: Payer: Self-pay | Admitting: Radiation Oncology

## 2021-01-17 ENCOUNTER — Ambulatory Visit
Admission: RE | Admit: 2021-01-17 | Discharge: 2021-01-17 | Disposition: A | Discharge status: Home / Self Care | Payer: 59 | Source: Ambulatory Visit | Attending: Radiation Oncology | Admitting: Radiation Oncology

## 2021-01-17 VITALS — BP 128/69 | HR 92 | Temp 97.0°F | Resp 18 | Wt 209.4 lb

## 2021-01-17 DIAGNOSIS — J449 Chronic obstructive pulmonary disease, unspecified: Secondary | ICD-10-CM | POA: Diagnosis not present

## 2021-01-17 DIAGNOSIS — Z79899 Other long term (current) drug therapy: Secondary | ICD-10-CM | POA: Insufficient documentation

## 2021-01-17 DIAGNOSIS — R232 Flushing: Secondary | ICD-10-CM | POA: Diagnosis not present

## 2021-01-17 DIAGNOSIS — K219 Gastro-esophageal reflux disease without esophagitis: Secondary | ICD-10-CM | POA: Insufficient documentation

## 2021-01-17 DIAGNOSIS — Z803 Family history of malignant neoplasm of breast: Secondary | ICD-10-CM | POA: Diagnosis not present

## 2021-01-17 DIAGNOSIS — C541 Malignant neoplasm of endometrium: Secondary | ICD-10-CM | POA: Insufficient documentation

## 2021-01-17 DIAGNOSIS — F17210 Nicotine dependence, cigarettes, uncomplicated: Secondary | ICD-10-CM | POA: Diagnosis not present

## 2021-01-17 DIAGNOSIS — Z800 Family history of malignant neoplasm of digestive organs: Secondary | ICD-10-CM | POA: Insufficient documentation

## 2021-01-17 NOTE — Progress Notes (Signed)
See MD note for nursing evaluation. °

## 2021-01-17 NOTE — Progress Notes (Signed)
Pharmacist Chemotherapy Monitoring - Initial Assessment    Anticipated start date: 01/23/21   The following has been reviewed per standard work regarding the patient's treatment regimen: The patient's diagnosis, treatment plan and drug doses, and organ/hematologic function Lab orders and baseline tests specific to treatment regimen  The treatment plan start date, drug sequencing, and pre-medications Prior authorization status  Patient's documented medication list, including drug-drug interaction screen and prescriptions for anti-emetics and supportive care specific to the treatment regimen The drug concentrations, fluid compatibility, administration routes, and timing of the medications to be used The patient's access for treatment and lifetime cumulative dose history, if applicable  The patient's medication allergies and previous infusion related reactions, if applicable   Changes made to treatment plan:  treatment plan date  Follow up needed:  N/A   Judge Stall, [facility], 01/17/2021  9:33 AM

## 2021-01-22 ENCOUNTER — Encounter (HOSPITAL_COMMUNITY): Payer: Self-pay | Admitting: General Practice

## 2021-01-22 ENCOUNTER — Encounter (HOSPITAL_COMMUNITY): Payer: Self-pay | Admitting: Hematology

## 2021-01-22 NOTE — Progress Notes (Signed)
Michele Romero, [place] [address]   CLINIC:  Medical Oncology/Hematology  PCP:  [name], NP [address]  [address] / [place] [address] [phone]   REASON FOR VISIT:  Follow-up for endometrial cancer  PRIOR THERAPY: none  NGS Results: not done  CURRENT THERAPY: Carboplatin and Taxol every 3 weeks  BRIEF ONCOLOGIC HISTORY:  Oncology History  Endometrial cancer ([place])  11/15/2020 Initial Diagnosis   Endometrial cancer ([place])   01/23/2021 -  Chemotherapy   Patient is on Treatment Plan : UTERINE Carboplatin AUC 6 / Paclitaxel q21d       CANCER STAGING: Cancer Staging Endometrial cancer ([place]) Staging form: Corpus Uteri - Carcinoma and Carcinosarcoma, AJCC 8th Edition - Clinical stage from 12/31/2020: FIGO Stage IIIC1 (cT1a, cN42m, cM0) - Unsigned   INTERVAL HISTORY:  [name] a 60y.o. female, returns for routine follow-up and consideration for next cycle of chemotherapy. C[place]awas last seen on 12/31/2020.  Due for cycle #1 of Carboplatin and Taxol today.   Overall, she tells me she has been feeling pretty well. She denies abdominal pain and tingling/numbness.   Overall, she feels ready for next cycle of chemo today.   REVIEW OF SYSTEMS:  Review of Systems  Constitutional:  Positive for fatigue (40%). Negative for appetite change (75%).  Respiratory:  Positive for shortness of breath (COPD).   Gastrointestinal:  Negative for abdominal pain.  Neurological:  Negative for numbness.  All other systems reviewed and are negative.  PAST MEDICAL/SURGICAL HISTORY:  Past Medical History:  Diagnosis Date   Ambulates with cane 10/31/2020   Arthritis    RIGHT HIP   Asthma    Cigarette nicotine dependence without complication [id number]/[id number]  Complex tear of medial meniscus of right knee 01/20/2017   COPD (chronic obstructive pulmonary disease) ([place]    ENDOMETRIAL CANCER 10/31/2020   Endometrial cancer  ([place] 11/15/2020   GERD (gastroesophageal reflux disease)    Migraines    Port-A-Cath in place 01/01/2021   Past Surgical History:  Procedure Laterality Date   APPENDECTOMY     YRS AGO PER PT ON 10-31-2020   BIOPSY  11/29/2019   Procedure: BIOPSY;  Surgeon: CHarvel Quale MD;  [facility];  Service: Gastroenterology;;  ileocecal vlve   CHOLECYSTECTOMY     YRS AGO PER PT ON 10-31-2020   COLONOSCOPY WITH PROPOFOL N/A 11/29/2019   Procedure: COLONOSCOPY WITH PROPOFOL;  Surgeon: CHarvel Quale MD;  Location: [facility];  Service: Gastroenterology;  Laterality: N/A;  930   HYSTEROSCOPY WITH D & C N/A 10/10/2020   Procedure: DILATATION AND CURETTAGE /HYSTEROSCOPY;  Surgeon: EFlorian Buff MD;  Location: [facility];  Service: Gynecology;  Laterality: N/A;   IR IMAGING GUIDED PORT INSERTION  01/08/2021   KNEE ARTHROPLASTY  2018   left knee   POLYPECTOMY  11/29/2019   Procedure: POLYPECTOMY;  Surgeon: CMontez Morita DQuillian Quince MD;  Location[facility];  Service: Gastroenterology;;  sigmoid colon    ROBOTIC ASSISTED TOTAL HYSTERECTOMY WITH BILATERAL SALPINGO OOPHERECTOMY N/A 11/15/2020   Procedure: XI ROBOTIC ASSISTED TOTAL HYSTERECTOMY GREATER THAN TWO HUNDRED AND FIFTY GRAMS WITH BILATERAL SALPINGO OOPHORECTOMY, MINI LAPAROTOMY;  Surgeon: REveritt Amber MD;  [facility]  Service: Gynecology;  Laterality: N/A;   SENTINEL NODE BIOPSY N/A 11/15/2020   Procedure: SENTINEL NODE BIOPSY;  Surgeon: REveritt Amber MD;  [facility]  Service: Gynecology;  Laterality: N/A;   TUBAL LIGATION  04/14/1985    SOCIAL HISTORY:  Social History   Socioeconomic History   Marital status: Single    Spouse name: Not on file   Number of children: 2   Years of education: Not on file   Highest education level: High school graduate  Occupational History   Not on file  Tobacco Use   Smoking status: Former    Packs/day: 0.50    Years:  30.00    Pack years: 15.00    Types: Cigarettes    Quit date: 01/01/2020    Years since quitting: 1.0    Passive exposure: Never   Smokeless tobacco: Never  Vaping Use   Vaping Use: Never used  Substance and Sexual Activity   Alcohol use: No   Drug use: Yes    Frequency: 6.0 times per week    Types: Marijuana    Comment: every now and then   Sexual activity: Not Currently    Birth control/protection: Surgical    Comment: tubal  Other Topics Concern   Not on file  Social History Narrative   Lives with daughter and 1 grandbaby   Son is in [place]      Enjoys: sitting outside, music, tv      Diet: eats all food groups   Caffeine: soda and tea: 2-3 cups daily   Water: 2-3 16 oz bottles      Wears seat belt    Does not use phone while driving   Oceanographer at home    No weapons    Social Determinants of Health   Financial Resource Strain: High Risk   Difficulty of Paying Living Expenses: Hard  Food Insecurity: No Food Insecurity   Worried About Charity fundraiser in the Last Year: Never true   Arboriculturist in the Last Year: Never true  Transportation Needs: Unmet Transportation Needs   Lack of Transportation (Medical): Yes   Lack of Transportation (Non-Medical): Yes  Physical Activity: Not on file  Stress: Stress Concern Present   Feeling of Stress : To some extent  Social Connections: Unknown   Frequency of Communication with Friends and Family: More than three times a week   Frequency of Social Gatherings with Friends and Family: More than three times a week   Attends Religious Services: More than 4 times per year   Active Member of [employer] or Organizations: Not on file   Attends Music therapist: Not on file   Marital Status: Not on file  Intimate Partner Violence: Not on file    FAMILY HISTORY:  Family History  Problem Relation Age of Onset   Diabetes Mother    Hypertension Mother    Heart disease Mother        CHF   Stroke Mother     Heart disease Father    Colon cancer Brother    Breast cancer Neg Hx    Ovarian cancer Neg Hx    Endometrial cancer Neg Hx    Prostate cancer Neg Hx    Pancreatic cancer Neg Hx     CURRENT MEDICATIONS:  Current Outpatient Medications  Medication Sig Dispense Refill   acetaminophen (TYLENOL) 500 MG tablet Take 1,000 mg by mouth every 6 (six) hours as needed for moderate pain or headache. (Patient not taking: Reported on 01/17/2021)     albuterol (VENTOLIN HFA) 108 (90 Base) MCG/ACT inhaler Inhale 2 puffs into the lungs every 6 (six) hours  as needed for wheezing or shortness of breath. 8 g 2   budesonide-formoterol (SYMBICORT) 80-4.5 MCG/ACT inhaler Inhale 2 puffs into the lungs 2 (two) times daily. 10.2 g 2   calcium carbonate (TUMS - DOSED IN MG ELEMENTAL CALCIUM) 500 MG chewable tablet Chew 1 tablet by mouth as needed for indigestion or heartburn.     CARBOPLATIN IV Inject into the vein every 21 ( twenty-one) days. (Patient not taking: Reported on 01/17/2021)     diphenhydrAMINE (BENADRYL) 25 MG tablet Take 25 mg by mouth daily as needed for allergies.     ibuprofen (ADVIL) 800 MG tablet Take 1 tablet (800 mg total) by mouth every 8 (eight) hours as needed for moderate pain. For AFTER surgery only 30 tablet 0   lidocaine-prilocaine (EMLA) cream Apply a small amount to port a cath site and cover with plastic wrap 1 hour prior to infusion appointments (Patient not taking: Reported on 01/17/2021) 30 g 3   Multiple Vitamin (MULTIVITAMIN WITH MINERALS) TABS tablet Take 1 tablet by mouth daily.     ondansetron (ZOFRAN ODT) 8 MG disintegrating tablet Take 1 tablet (8 mg total) by mouth every 8 (eight) hours as needed for nausea or vomiting. (Patient not taking: Reported on 01/17/2021) 8 tablet 0   PACLITAXEL IV Inject into the vein every 21 ( twenty-one) days. (Patient not taking: Reported on 01/17/2021)     Pegfilgrastim-cbqv (UDENYCA [place]) Inject into the skin every 21 ( twenty-one) days. Administer on  Day 3 of each chemotherapy cycle (Patient not taking: Reported on 01/17/2021)     prochlorperazine (COMPAZINE) 10 MG tablet Take 1 tablet (10 mg total) by mouth every 6 (six) hours as needed (Nausea or vomiting). (Patient not taking: Reported on 01/17/2021) 30 tablet 1   senna-docusate (SENOKOT-S) 8.6-50 MG tablet Take 2 tablets by mouth at bedtime. For AFTER surgery, do not take if having diarrhea (Patient not taking: Reported on 01/17/2021) 30 tablet 0   traMADol (ULTRAM) 50 MG tablet Take 1 tablet (50 mg total) by mouth every 6 (six) hours as needed for severe pain. For AFTER surgery only, do not take and drive (Patient not taking: Reported on 01/17/2021) 10 tablet 0   vitamin B-12 (CYANOCOBALAMIN) 500 MCG tablet Take 500 mcg by mouth daily.     No current facility-administered medications for this visit.    ALLERGIES:  Allergies  Allergen Reactions   Codeine Nausea And Vomiting and Rash    PHYSICAL EXAM:  Performance status (ECOG): 1 - Symptomatic but completely ambulatory  There were no vitals filed for this visit. Wt Readings from Last 3 Encounters:  01/17/21 209 lb 6.4 oz (95 kg)  12/31/20 209 lb 1.6 oz (94.8 kg)  12/07/20 216 lb (98 kg)   Physical Exam Vitals reviewed.  Constitutional:      Appearance: Normal appearance.  Cardiovascular:     Rate and Rhythm: Normal rate and regular rhythm.     Pulses: Normal pulses.     Heart sounds: Normal heart sounds.  Pulmonary:     Effort: Pulmonary effort is normal.     Breath sounds: Normal breath sounds.  Abdominal:     Palpations: Abdomen is soft. There is no hepatomegaly, splenomegaly or mass.     Tenderness: There is no abdominal tenderness.  Musculoskeletal:     Right lower leg: No edema.     Left lower leg: No edema.  Neurological:     General: No focal deficit present.     Mental Status:  She is alert and oriented to person, place, and time.  Psychiatric:        Mood and Affect: Mood normal.        Behavior: Behavior  normal.    LABORATORY DATA:  I have reviewed the labs as listed.  CBC Latest Ref Rng & Units 11/12/2020 10/04/2020 11/02/2019  WBC 4.0 - 10.5 K/uL 5.0 5.4 7.3  Hemoglobin 12.0 - 15.0 g/dL 11.6(L) 12.2 13.0  Hematocrit 36.0 - 46.0 % 35.8(L) 37.6 39.4  Platelets 150 - 400 K/uL 238 226 232   CMP Latest Ref Rng & Units 11/12/2020 10/04/2020 11/02/2019  Glucose 70 - 99 mg/dL 91 93 92  BUN 8 - 23 mg/dL _0 Creatinine 0.44 - 1.00 mg/dL 0.73 0.60 0.72  Sodium 135 - 145 mmol/L 142 138 143  Potassium 3.5 - 5.1 mmol/L 4.2 3.9 4.3  Chloride 98 - 111 mmol/L 110 104 105  CO2 22 - 32 mmol/L _1 Calcium 8.9 - 10.3 mg/dL 8.5(L) 8.7(L) 8.9  Total Protein 6.5 - 8.1 g/dL 6.9 6.9 7.3  Total Bilirubin 0.3 - 1.2 mg/dL 0.5 0.6 0.3  Alkaline Phos 38 - 126 U/L 71 68 -  AST 15 - 41 U/L 14(L) 16 13  ALT 0 - 44 U/L _2 DIAGNOSTIC IMAGING:  I have independently reviewed the scans and discussed with the patient. IR IMAGING GUIDED PORT INSERTION  Result Date: 01/08/2021 INDICATION: Chemotherapy EXAM: IMPLANTED PORT A CATH PLACEMENT WITH ULTRASOUND AND FLUOROSCOPIC GUIDANCE MEDICATIONS: None ANESTHESIA/SEDATION: Moderate (conscious) sedation was employed during this procedure. A total of Versed 4 mg and Fentanyl 100 mcg was administered intravenously. Moderate Sedation Time: 36 minutes. The patient's level of consciousness and vital signs were monitored continuously by radiology nursing throughout the procedure under my direct supervision. FLUOROSCOPY TIME:  42 seconds with 2 exposures COMPLICATIONS: None immediate. PROCEDURE: The procedure, risks, benefits, and alternatives were explained to the patient. Questions regarding the procedure were encouraged and answered. The patient understands and consents to the procedure. Maximum barrier sterile technique with sterile gowns and gloves were used for the procedure. A timeout was performed prior to the initiation of the procedure. The patient was placed  supine on the exam table. The right neck and chest was prepped and draped in the standard sterile fashion. A preliminary ultrasound of the right neck was performed and demonstrates a patent right internal jugular vein. A permanent ultrasound image was stored. The overlying skin was anesthetized with 1% Lidocaine. Using ultrasound guidance, access was obtained into the right internal jugular vein using a 21 gauge micropuncture set. A wire was advanced into the SVC, a short incision was made at the puncture site, and serial dilatation performed. Next, in an ipsilateral infraclavicular location, an incision was made at the site of the subcutaneous reservoir. Blunt dissection was used to open a pocket to contain the reservoir. A subcutaneous tunnel was then created from the port site to the puncture site. A(n) 8 Fr single lumen catheter was advanced through the tunnel. The catheter was attached to the port and this was placed in the subcutaneous pocket. Under fluoroscopic guidance, a peel away sheath was placed, and the catheter was trimmed to the appropriate length and was advanced into the central veins. The tip of the catheter lies near the superior cavoatrial junction. The port flushes and aspirates appropriately. The port was flushed and locked with heparinized saline. The port pocket was closed in 2 layers using  3-0 and 4-0 Vicryl/absorbable suture. The venotomy incision was also closed with 4-0 Vicryl. Dermabond was applied to both incisions. The patient tolerated the procedure well and was transferred to recovery in stable condition. IMPRESSION: Successful placement of a right chest port using the right internal jugular vein. The port is ready for immediate use. Electronically Signed   By: Albin Felling M.D.   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 - Endometrial biopsy on 10/10/2020 consistent with  carcinosarcoma, MMR preserved. - CT scan of the abdomen and pelvis with contrast on 10/29/2020 with endometrial mass, no compelling findings of metastatic disease in the abdomen or pelvis.  30 cm fatty lesion intraluminally in the distal transverse colon probably lipoma. - Robotic assisted laparoscopic total hysterectomy with bilateral salpingo-oophorectomy and sentinel lymph node biopsy by Dr. Denman George on 11/15/2020. - Pathology consistent with carcinosarcoma spanning 8.5 cm, tumor invades less than one half of myometrium, LVI positive, 1 lymph node involved with micrometastasis, PT1APN1 MI, MMR preserved, MSI-stable - She was evaluated by Dr. Denman George and 6 cycles of adjuvant chemotherapy with carboplatin and paclitaxel with vaginal brachytherapy was recommended. - Cycle 1 of carboplatin and paclitaxel on 01/23/2021.  2.  Social/family history: - Lives at home with her daughter and granddaughter.  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  She quit smoking in September 2021.  Half pack per day smoker for 30 years.  She has severe right hip arthritis, requiring her to use a cane for ambulation. - No family history of malignancies.   PLAN:  Stage III C1 (PT1APN1A) endometrial carcinosarcoma: -She has right chest wall port placed which looks good today. - She does not report any baseline tingling or numbness in the extremities. - I have reviewed her labs today.  Potassium is slightly low at 3.3 which will be repleted. - We talked about the side effects of chemotherapy regimen in detail including but not limited to alopecia, bone marrow suppression, leg pains from paclitaxel, nail changes, peripheral neuropathy along with her GI side effects.  She will also receive G-CSF for primary prophylaxis. - She was told to take Claritin to decrease incidence of bone pains. - She will proceed with cycle 1 today without any dose modifications. - RTC 3 weeks for  follow-up with repeat labs and treatment.  2.  Mild normocytic anemia: -Today her hemoglobin is 9.2 with MCV 89.7. - We will obtain ferritin, iron panel, Z61 and folic acid levels.   Orders placed this encounter:  No orders of the defined types were placed in this encounter.    Derek Jack, MD [place] [phone]   I, Thana Ates, am acting as a scribe for Dr. Derek Jack.  I, Derek Jack MD, have reviewed the above documentation for accuracy and completeness, and I agree with the above.

## 2021-01-22 NOTE — Progress Notes (Signed)
[facility]  Initial Assessment   Michele Romero is a 60 y.o. year old female contacted by phone. Clinical Social Work was referred by radiation oncology for assessment of psychosocial needs.   SDOH (Social Determinants of Health) assessments performed: Yes SDOH Interventions    Flowsheet Row Most Recent Value  SDOH Interventions   Food Insecurity Interventions Intervention Not Indicated  Financial Strain Interventions Financial Counselor  Housing Interventions --  Engineer, site to Financial Advocate]  Stress Interventions Intervention Not Indicated  Social Connections Interventions Intervention Not Indicated  Transportation Interventions [facility]       Distress Screen completed: Yes ONCBCN DISTRESS SCREENING 01/17/2021  Screening Type Initial Screening  Distress experienced in past week (1-10) 5  Practical problem type Housing;Food  Emotional problem type Adjusting to illness;Isolation/feeling alone  Information Concerns Type Lack of info about diagnosis;Lack of info about treatment  Physical Problem type Pain;Getting around;Breathing;Skin dry/itchy  Physician notified of physical symptoms -  Referral to clinical psychology -  Referral to clinical social work Yes  Referral to dietition -  Referral to financial advocate -  Referral to support programs -  Referral to palliative care -      Family/Social Information:  Housing Arrangement: patient lives with daughter. Family members/support persons in your life? Family and daughter and granddaughter Transportation concerns: yes, can get to [facility], needs help getting to [facility] in [place]  Employment: Out on work excuse. Income source: No income, is applying for disability through [employer] office; unsure whether she will qualify for Medicaid.  Financial concerns: Yes, due to illness and/or loss of work during treatment  Does not think she will be able to return to work due to cancer  treatment and then need for hip surgery Type of concern: Rent/ mortgage, Transportation, and has two paper she needs completed by [facility] re inability to repay personal loans Food access concerns: no, granddaughter is buying food Religious or spiritual practice: yes, Holiness Medication Concerns: yes, has not been able to purchase any medications due to no income; she does not have inhalers, Compazine, numbing cream.   Services Currently in place:  none  Coping/ Adjustment to diagnosis: Patient understands treatment plan and what happens next? yes, newly diagnosed with stage 3C carcinosarcoma of the endometrium.  She will be getting chemotherapy at [facility] and radiation therapy at [facility].  She has already had surgery.  She does need additional surgery on her hip in the future.  She is "trying not to worry."   Concerns about diagnosis and/or treatment: Losing my job and How I will pay for the services I need Patient reported stressors: Housing, Insurance underwriter, Publishing rights manager, Scientist, research (physical sciences) and priorities: remaining positive Current coping skills/ strengths: Capable of independent living  and Supportive family/friends     SUMMARY: Current SDOH Barriers:  Financial constraints related to no income at present.  She is unable to work, used to work as a Quarry manager.  She does not have any short or long term disability.   Interventions: Discussed common feeling and emotions when being diagnosed with cancer, and the importance of support during treatment Informed patient of the support team roles and support services at [facility] Provided CSW contact information and encouraged patient to call with any questions or concerns Referred patient to [place] and to assess for medication needs.  [place]   Follow Up Plan: Patient will contact CSW with any support or resource needs, will call her  in two weeks to assess progress.  Patient verbalizes understanding of plan:  Yes    Beverely Pace , [place], LCSW Clinical Social Worker Phone:  [phone]

## 2021-01-23 ENCOUNTER — Inpatient Hospital Stay (HOSPITAL_COMMUNITY): Payer: 59

## 2021-01-23 ENCOUNTER — Other Ambulatory Visit: Payer: Self-pay

## 2021-01-23 ENCOUNTER — Other Ambulatory Visit (HOSPITAL_COMMUNITY): Payer: Self-pay | Admitting: *Deleted

## 2021-01-23 ENCOUNTER — Ambulatory Visit (HOSPITAL_COMMUNITY): Payer: 59 | Admitting: Hematology

## 2021-01-23 ENCOUNTER — Inpatient Hospital Stay (HOSPITAL_BASED_OUTPATIENT_CLINIC_OR_DEPARTMENT_OTHER): Payer: 59 | Admitting: Hematology

## 2021-01-23 VITALS — BP 125/81 | HR 98 | Temp 97.0°F | Resp 18 | Wt 211.0 lb

## 2021-01-23 VITALS — BP 119/76 | HR 97 | Temp 96.9°F | Resp 18 | Ht 62.0 in | Wt 211.8 lb

## 2021-01-23 DIAGNOSIS — C541 Malignant neoplasm of endometrium: Secondary | ICD-10-CM

## 2021-01-23 DIAGNOSIS — E876 Hypokalemia: Secondary | ICD-10-CM

## 2021-01-23 DIAGNOSIS — D649 Anemia, unspecified: Secondary | ICD-10-CM | POA: Diagnosis not present

## 2021-01-23 DIAGNOSIS — Z95828 Presence of other vascular implants and grafts: Secondary | ICD-10-CM

## 2021-01-23 DIAGNOSIS — Z5111 Encounter for antineoplastic chemotherapy: Secondary | ICD-10-CM | POA: Diagnosis present

## 2021-01-23 LAB — COMPREHENSIVE METABOLIC PANEL
ALT: 9 U/L (ref 0–44)
AST: 16 U/L (ref 15–41)
Albumin: 3.1 g/dL — ABNORMAL LOW (ref 3.5–5.0)
Alkaline Phosphatase: 56 U/L (ref 38–126)
Anion gap: 8 (ref 5–15)
BUN: 15 mg/dL (ref 8–23)
CO2: 26 mmol/L (ref 22–32)
Calcium: 8.6 mg/dL — ABNORMAL LOW (ref 8.9–10.3)
Chloride: 104 mmol/L (ref 98–111)
Creatinine, Ser: 0.56 mg/dL (ref 0.44–1.00)
GFR, Estimated: >60 mL/min (ref >60)
Glucose, Bld: 127 mg/dL — ABNORMAL HIGH (ref 70–99)
Potassium: 3.3 mmol/L — ABNORMAL LOW (ref 3.5–5.1)
Sodium: 138 mmol/L (ref 135–145)
Total Bilirubin: 0.7 mg/dL (ref 0.3–1.2)
Total Protein: 6.9 g/dL (ref 6.5–8.1)

## 2021-01-23 LAB — CBC WITH DIFFERENTIAL/PLATELET
Abs Immature Granulocytes: 0.05 10*3/uL (ref 0.00–0.07)
Basophils Absolute: 0 10*3/uL (ref 0.0–0.1)
Basophils Relative: 0 %
Eosinophils Absolute: 0.1 10*3/uL (ref 0.0–0.5)
Eosinophils Relative: 1 %
HCT: 28 % — ABNORMAL LOW (ref 36.0–46.0)
Hemoglobin: 9.2 g/dL — ABNORMAL LOW (ref 12.0–15.0)
Immature Granulocytes: 0 %
Lymphocytes Relative: 19 %
Lymphs Abs: 2.1 10*3/uL (ref 0.7–4.0)
MCH: 29.5 pg (ref 26.0–34.0)
MCHC: 32.9 g/dL (ref 30.0–36.0)
MCV: 89.7 fL (ref 80.0–100.0)
Monocytes Absolute: 1.1 10*3/uL — ABNORMAL HIGH (ref 0.1–1.0)
Monocytes Relative: 9 %
Neutro Abs: 8 10*3/uL — ABNORMAL HIGH (ref 1.7–7.7)
Neutrophils Relative %: 71 %
Platelets: 312 10*3/uL (ref 150–400)
RBC: 3.12 MIL/uL — ABNORMAL LOW (ref 3.87–5.11)
RDW: 14.4 % (ref 11.5–15.5)
WBC: 11.4 10*3/uL — ABNORMAL HIGH (ref 4.0–10.5)
nRBC: 0 % (ref 0.0–0.2)

## 2021-01-23 LAB — VITAMIN B12: Vitamin B-12: 1110 pg/mL — ABNORMAL HIGH (ref 180–914)

## 2021-01-23 LAB — IRON AND TIBC
Iron: 15 ug/dL — ABNORMAL LOW (ref 28–170)
Saturation Ratios: 6 % — ABNORMAL LOW (ref 10.4–31.8)
TIBC: 244 ug/dL — ABNORMAL LOW (ref 250–450)
UIBC: 229 ug/dL

## 2021-01-23 LAB — FERRITIN: Ferritin: 245 ng/mL (ref 11–307)

## 2021-01-23 LAB — FOLATE: Folate: 11.9 ng/mL (ref >5.9)

## 2021-01-23 MED ORDER — PROCHLORPERAZINE MALEATE 10 MG PO TABS: 10.0000 mg | ORAL_TABLET | Freq: Four times a day (QID) | ORAL | 3 refills | Status: DC | PRN

## 2021-01-23 MED ORDER — PALONOSETRON HCL INJECTION 0.25 MG/5ML
0.2500 mg | Freq: Once | INTRAVENOUS | Status: AC
  Administered 2021-01-23: 0.25 mg via INTRAVENOUS
  Filled 2021-01-23: qty 5

## 2021-01-23 MED ORDER — FAMOTIDINE 20 MG IN NS 100 ML IVPB
20.0000 mg | Freq: Once | INTRAVENOUS | Status: AC
  Administered 2021-01-23: 20 mg via INTRAVENOUS
  Filled 2021-01-23: qty 20

## 2021-01-23 MED ORDER — SODIUM CHLORIDE 0.9 % IV SOLN
813.0000 mg | Freq: Once | INTRAVENOUS | Status: AC
  Administered 2021-01-23: 810 mg via INTRAVENOUS
  Filled 2021-01-23: qty 81

## 2021-01-23 MED ORDER — POTASSIUM CHLORIDE CRYS ER 20 MEQ PO TBCR
40.0000 meq | EXTENDED_RELEASE_TABLET | Freq: Once | ORAL | Status: AC
  Administered 2021-01-23: 40 meq via ORAL
  Filled 2021-01-23: qty 2

## 2021-01-23 MED ORDER — LORATADINE 10 MG PO TABS: 10.0000 mg | ORAL_TABLET | Freq: Every day | ORAL | 2 refills | Status: DC

## 2021-01-23 MED ORDER — HEPARIN SOD (PORK) LOCK FLUSH 100 UNIT/ML IV SOLN: 250.0000 [IU] | Freq: Once | INTRAVENOUS | Status: DC | PRN

## 2021-01-23 MED ORDER — SODIUM CHLORIDE 0.9% FLUSH: 3.0000 mL | INTRAVENOUS | Status: DC | PRN

## 2021-01-23 MED ORDER — DIPHENHYDRAMINE HCL 50 MG/ML IJ SOLN
50.0000 mg | Freq: Once | INTRAMUSCULAR | Status: AC
  Administered 2021-01-23: 50 mg via INTRAVENOUS
  Filled 2021-01-23: qty 1

## 2021-01-23 MED ORDER — SODIUM CHLORIDE 0.9 % IV SOLN: Freq: Once | INTRAVENOUS | Status: AC

## 2021-01-23 MED ORDER — ALTEPLASE 2 MG IJ SOLR: 2.0000 mg | Freq: Once | INTRAMUSCULAR | Status: DC | PRN

## 2021-01-23 MED ORDER — FOSAPREPITANT DIMEGLUMINE INJECTION 150 MG
150.0000 mg | Freq: Once | INTRAVENOUS | Status: AC
  Administered 2021-01-23: 150 mg via INTRAVENOUS
  Filled 2021-01-23: qty 150

## 2021-01-23 MED ORDER — HEPARIN SOD (PORK) LOCK FLUSH 100 UNIT/ML IV SOLN
500.0000 [IU] | Freq: Once | INTRAVENOUS | Status: AC | PRN
  Administered 2021-01-23: 500 [IU]

## 2021-01-23 MED ORDER — SODIUM CHLORIDE 0.9 % IV SOLN
175.0000 mg/m2 | Freq: Once | INTRAVENOUS | Status: AC
  Administered 2021-01-23: 360 mg via INTRAVENOUS
  Filled 2021-01-23: qty 60

## 2021-01-23 MED ORDER — SODIUM CHLORIDE 0.9 % IV SOLN
10.0000 mg | Freq: Once | INTRAVENOUS | Status: AC
  Administered 2021-01-23: 10 mg via INTRAVENOUS
  Filled 2021-01-23: qty 10

## 2021-01-23 MED ORDER — LIDOCAINE-PRILOCAINE 2.5-2.5 % EX CREA
TOPICAL_CREAM | CUTANEOUS | 3 refills | Status: DC
Start: 2021-01-23 — End: 2021-06-19

## 2021-01-23 MED ORDER — SODIUM CHLORIDE 0.9% FLUSH
10.0000 mL | INTRAVENOUS | Status: DC | PRN
  Administered 2021-01-23: 10 mL

## 2021-01-23 NOTE — Progress Notes (Signed)
Patient presents today for D1C1 Taxol/Carboplatin infusion per providers order.  Vital signs within parameters for treatment..  Labs pending.  Patient has no new complaints at this time.  Labs reviewed and within parameters for treatment.  Message received from Anastasio Champion RN/Dr Edmond, patient okay for treatment.  Taxol/Carboplatin given today per MD orders.  Stable during infusions without adverse affects.  Vital signs stable.  No complaints at this time.  Discharge from clinic ambulatory in stable condition.  Alert and oriented X 3.  Follow up with [facility] as scheduled.

## 2021-01-23 NOTE — Patient Instructions (Signed)
[place] at [facility] Discharge Instructions  You were seen and examined by Dr. Delton Coombes today.  You will receive your first cycle of chemotherapy today with Taxol and carboplatin. Prescriptions for nausea, body aches (Claritin), and numbing cream for your port has been sent to [facility] on [address]. Return as scheduled for your injection on Friday. Return in 3 weeks as scheduled for your next cycle of treatment.    Thank you for choosing [place] at [facility] to provide your oncology and hematology care.  To afford each patient quality time with our provider, please arrive at least 15 minutes before your scheduled appointment time.   If you have a lab appointment with the [facility] please come in thru the Main Entrance and check in at the main information desk.  You need to re-schedule your appointment should you arrive 10 or more minutes late.  We strive to give you quality time with our providers, and arriving late affects you and other patients whose appointments are after yours.  Also, if you no show three or more times for appointments you may be dismissed from the clinic at the providers discretion.     Again, thank you for choosing [facility].  Our hope is that these requests will decrease the amount of time that you wait before being seen by our physicians.       _____________________________________________________________  Should you have questions after your visit to [facility], please contact our office at [phone] and follow the prompts.  Our office hours are 8:00 a.m. and 4:30 p.m. Monday - Friday.  Please note that voicemails left after 4:00 p.m. may not be returned until the following business day.  We are closed weekends and major holidays.  You do have access to a nurse 24-7, just call the main number to the clinic [phone] and do not press any options, hold on the line and a nurse  will answer the phone.    For prescription refill requests, have your pharmacy contact our office and allow 72 hours.    Due to Covid, you will need to wear a mask upon entering the hospital. If you do not have a mask, a mask will be given to you at the Main Entrance upon arrival. For doctor visits, patients may have 1 support person age 37 or older with them. For treatment visits, patients can not have anyone with them due to social distancing guidelines and our immunocompromised population.

## 2021-01-23 NOTE — Patient Instructions (Signed)
[facility]  Discharge Instructions: Thank you for choosing [facility] to provide your oncology and hematology care.  If you have a lab appointment with the Cancer Center, please come in thru the Main Entrance and check in at the main information desk.  Wear comfortable clothing and clothing appropriate for easy access to any Portacath or PICC line.   We strive to give you quality time with your provider. You may need to reschedule your appointment if you arrive late (15 or more minutes).  Arriving late affects you and other patients whose appointments are after yours.  Also, if you miss three or more appointments without notifying the office, you may be dismissed from the clinic at the provider's discretion.      For prescription refill requests, have your pharmacy contact our office and allow 72 hours for refills to be completed.    Today you received the following chemotherapy and/or immunotherapy agents Taxol/Carboplatin.       To help prevent nausea and vomiting after your treatment, we encourage you to take your nausea medication as directed.  BELOW ARE SYMPTOMS THAT SHOULD BE REPORTED IMMEDIATELY: *FEVER GREATER THAN 100.4 F (38 C) OR HIGHER *CHILLS OR SWEATING *NAUSEA AND VOMITING THAT IS NOT CONTROLLED WITH YOUR NAUSEA MEDICATION *UNUSUAL SHORTNESS OF BREATH *UNUSUAL BRUISING OR BLEEDING *URINARY PROBLEMS (pain or burning when urinating, or frequent urination) *BOWEL PROBLEMS (unusual diarrhea, constipation, pain near the anus) TENDERNESS IN MOUTH AND THROAT WITH OR WITHOUT PRESENCE OF ULCERS (sore throat, sores in mouth, or a toothache) UNUSUAL RASH, SWELLING OR PAIN  UNUSUAL VAGINAL DISCHARGE OR ITCHING   Items with * indicate a potential emergency and should be followed up as soon as possible or go to the Emergency Department if any problems should occur.  Please show the CHEMOTHERAPY ALERT CARD or IMMUNOTHERAPY ALERT CARD at check-in to the  Emergency Department and triage nurse.  Should you have questions after your visit or need to cancel or reschedule your appointment, please contact [facility] [phone]  and follow the prompts.  Office hours are 8:00 a.m. to 4:30 p.m. Monday - Friday. Please note that voicemails left after 4:00 p.m. may not be returned until the following business day.  We are closed weekends and major holidays. You have access to a nurse at all times for urgent questions. Please call the main number to the clinic [phone] and follow the prompts.  For any non-urgent questions, you may also contact your provider using MyChart. We now offer e-Visits for anyone 18 and older to request care online for non-urgent symptoms. For details visit [email or web address].   Also download the MyChart app! Go to the app store, search "MyChart", open the app, select [place], and log in with your MyChart username and password.  Due to Covid, a mask is required upon entering the hospital/clinic. If you do not have a mask, one will be given to you upon arrival. For doctor visits, patients may have 1 support person aged 18 or older with them. For treatment visits, patients cannot have anyone with them due to current Covid guidelines and our immunocompromised population.  

## 2021-01-23 NOTE — Progress Notes (Signed)
Patient has been examined, vital signs and labs have been reviewed by Dr. Katragadda. ANC, Creatinine, LFTs, hemoglobin, and platelets are within treatment parameters per Dr. Katragadda. Patient may proceed with treatment per M.D.   

## 2021-01-24 ENCOUNTER — Telehealth: Payer: Self-pay | Admitting: *Deleted

## 2021-01-24 NOTE — Progress Notes (Signed)
24 hour call back.  01/24/21 1305, call the numbers listed for the patient with no answer.  Mail box was not set up to leave a message.

## 2021-01-24 NOTE — Telephone Encounter (Signed)
RETURNED PATIENT'S PHONE CALL, SPOKE WITH PATIENT. ?

## 2021-01-25 ENCOUNTER — Ambulatory Visit (HOSPITAL_COMMUNITY): Payer: PRIVATE HEALTH INSURANCE

## 2021-01-25 ENCOUNTER — Other Ambulatory Visit: Payer: Self-pay

## 2021-01-25 ENCOUNTER — Emergency Department (HOSPITAL_COMMUNITY)
Admission: EM | Admit: 2021-01-25 | Discharge: 2021-01-25 | Disposition: A | Discharge status: Home / Self Care | Payer: 59 | Attending: Emergency Medicine | Admitting: Emergency Medicine

## 2021-01-25 ENCOUNTER — Encounter (HOSPITAL_COMMUNITY): Payer: Self-pay | Admitting: Emergency Medicine

## 2021-01-25 ENCOUNTER — Emergency Department (HOSPITAL_COMMUNITY): Payer: 59

## 2021-01-25 DIAGNOSIS — R12 Heartburn: Secondary | ICD-10-CM | POA: Diagnosis not present

## 2021-01-25 DIAGNOSIS — I10 Essential (primary) hypertension: Secondary | ICD-10-CM | POA: Diagnosis not present

## 2021-01-25 DIAGNOSIS — R110 Nausea: Secondary | ICD-10-CM | POA: Diagnosis not present

## 2021-01-25 DIAGNOSIS — C541 Malignant neoplasm of endometrium: Secondary | ICD-10-CM | POA: Diagnosis not present

## 2021-01-25 DIAGNOSIS — Z87891 Personal history of nicotine dependence: Secondary | ICD-10-CM | POA: Insufficient documentation

## 2021-01-25 DIAGNOSIS — Z96652 Presence of left artificial knee joint: Secondary | ICD-10-CM | POA: Diagnosis not present

## 2021-01-25 DIAGNOSIS — Z7951 Long term (current) use of inhaled steroids: Secondary | ICD-10-CM | POA: Insufficient documentation

## 2021-01-25 DIAGNOSIS — R0789 Other chest pain: Secondary | ICD-10-CM | POA: Insufficient documentation

## 2021-01-25 DIAGNOSIS — J449 Chronic obstructive pulmonary disease, unspecified: Secondary | ICD-10-CM | POA: Insufficient documentation

## 2021-01-25 DIAGNOSIS — J45909 Unspecified asthma, uncomplicated: Secondary | ICD-10-CM | POA: Diagnosis not present

## 2021-01-25 DIAGNOSIS — I959 Hypotension, unspecified: Secondary | ICD-10-CM | POA: Diagnosis not present

## 2021-01-25 LAB — BASIC METABOLIC PANEL
Anion gap: 7 (ref 5–15)
BUN: 20 mg/dL (ref 8–23)
CO2: 25 mmol/L (ref 22–32)
Calcium: 9 mg/dL (ref 8.9–10.3)
Chloride: 106 mmol/L (ref 98–111)
Creatinine, Ser: 0.71 mg/dL (ref 0.44–1.00)
GFR, Estimated: >60 mL/min (ref >60)
Glucose, Bld: 136 mg/dL — ABNORMAL HIGH (ref 70–99)
Potassium: 3.6 mmol/L (ref 3.5–5.1)
Sodium: 138 mmol/L (ref 135–145)

## 2021-01-25 LAB — TROPONIN I (HIGH SENSITIVITY)
Troponin I (High Sensitivity): 3 ng/L (ref <18)
Troponin I (High Sensitivity): 3 ng/L (ref <18)

## 2021-01-25 LAB — CBC WITH DIFFERENTIAL/PLATELET
Abs Immature Granulocytes: 0.04 10*3/uL (ref 0.00–0.07)
Basophils Absolute: 0 10*3/uL (ref 0.0–0.1)
Basophils Relative: 0 %
Eosinophils Absolute: 0 10*3/uL (ref 0.0–0.5)
Eosinophils Relative: 0 %
HCT: 25.7 % — ABNORMAL LOW (ref 36.0–46.0)
Hemoglobin: 8.3 g/dL — ABNORMAL LOW (ref 12.0–15.0)
Immature Granulocytes: 1 %
Lymphocytes Relative: 17 %
Lymphs Abs: 1.3 10*3/uL (ref 0.7–4.0)
MCH: 29.1 pg (ref 26.0–34.0)
MCHC: 32.3 g/dL (ref 30.0–36.0)
MCV: 90.2 fL (ref 80.0–100.0)
Monocytes Absolute: 0.4 10*3/uL (ref 0.1–1.0)
Monocytes Relative: 5 %
Neutro Abs: 5.7 10*3/uL (ref 1.7–7.7)
Neutrophils Relative %: 77 %
Platelets: 257 10*3/uL (ref 150–400)
RBC: 2.85 MIL/uL — ABNORMAL LOW (ref 3.87–5.11)
RDW: 14.3 % (ref 11.5–15.5)
WBC: 7.4 10*3/uL (ref 4.0–10.5)
nRBC: 0 % (ref 0.0–0.2)

## 2021-01-25 MED ORDER — SODIUM CHLORIDE 0.9 % IV BOLUS
500.0000 mL | Freq: Once | INTRAVENOUS | Status: AC
  Administered 2021-01-25: 500 mL via INTRAVENOUS

## 2021-01-25 MED ORDER — PANTOPRAZOLE SODIUM 40 MG PO TBEC: 40.0000 mg | DELAYED_RELEASE_TABLET | Freq: Every day | ORAL | 1 refills | Status: DC

## 2021-01-25 NOTE — ED Provider Notes (Signed)
[facility] EMERGENCY DEPARTMENT Provider Note   CSN: [id number] Arrival date & time: 01/25/21  0601     History Chief Complaint  Patient presents with   Chest Pain    Michele Romero is a 60 y.o. female.  HPI She reports onset of left-sided chest pain with radiation to left arm, this morning.  She has also had some nausea without vomiting.  She did not eat this morning or take her medicines.  She states that yesterday was a good day and she felt well and was eating normally.  She is currently being treated with chemotherapy, for uterine cancer.  She denies fever, chills, cough, shortness of breath, focal weakness or paresthesia.  She has not fallen or injured herself.  No history of chronic chest pain or known cardiac disorder.  There are no other known active modifying factors. HPI: A 60 year old patient with a history of obesity presents for evaluation of chest pain. Initial onset of pain was approximately 1-3 hours ago. The patient's chest pain is not worse with exertion. The patient's chest pain is middle- or left-sided, is not well-localized, is not described as heaviness/pressure/tightness, is not sharp and does not radiate to the arms/jaw/neck. The patient does not complain of nausea and denies diaphoresis. The patient has no history of stroke, has no history of peripheral artery disease, has not smoked in the past 90 days, denies any history of treated diabetes, has no relevant family history of coronary artery disease (first degree relative at less than age 54), is not hypertensive and has no history of hypercholesterolemia.   Past Medical History:  Diagnosis Date   Ambulates with cane 10/31/2020   Arthritis    RIGHT HIP   Asthma    Cigarette nicotine dependence without complication 50/12/3816   Complex tear of medial meniscus of right knee 01/20/2017   COPD (chronic obstructive pulmonary disease) ([place])    ENDOMETRIAL CANCER 10/31/2020   Endometrial cancer ([place]) 11/15/2020    GERD (gastroesophageal reflux disease)    Migraines    Port-A-Cath in place 01/01/2021    Patient Active Problem List   Diagnosis Date Noted   Port-A-Cath in place 01/01/2021   Endometrial cancer ([place]) 11/15/2020   PMB (postmenopausal bleeding)    Thickened endometrium    Endometrial polyp    Left ear impacted cerumen 10/09/2020   Unilateral primary osteoarthritis, right hip 12/06/2019   Right hip pain 10/30/2019   Encounter for screening for malignant neoplasm of colon 10/30/2019   Encounter for general adult medical examination with abnormal findings 10/30/2019   Essential hypertension 10/30/2019   Chronic obstructive pulmonary disease ([place]) 10/28/2019   Prediabetes 01/20/2017   Osteoarthritis of right knee 01/20/2017   Morbid obesity ([place]) 06/25/2015    Past Surgical History:  Procedure Laterality Date   APPENDECTOMY     YRS AGO PER PT ON 10-31-2020   BIOPSY  11/29/2019   Procedure: BIOPSY;  Surgeon: Harvel Quale, MD;  [facility]: [facility];  Service: Gastroenterology;;  ileocecal vlve   CHOLECYSTECTOMY     YRS AGO PER PT ON 10-31-2020   COLONOSCOPY WITH PROPOFOL N/A 11/29/2019   Procedure: COLONOSCOPY WITH PROPOFOL;  Surgeon: Harvel Quale, MD;  Location: [facility];  Service: Gastroenterology;  Laterality: N/A;  930   HYSTEROSCOPY WITH D & C N/A 10/10/2020   Procedure: DILATATION AND CURETTAGE /HYSTEROSCOPY;  Surgeon: Florian Buff, MD;  Location: [facility];  Service: Gynecology;  Laterality: N/A;   IR IMAGING GUIDED PORT  INSERTION  01/08/2021   KNEE ARTHROPLASTY  2018   left knee   POLYPECTOMY  11/29/2019   Procedure: POLYPECTOMY;  Surgeon: Montez Morita, Quillian Quince, MD;  Location: [facility];  Service: Gastroenterology;;  sigmoid colon    ROBOTIC ASSISTED TOTAL HYSTERECTOMY WITH BILATERAL SALPINGO OOPHERECTOMY N/A 11/15/2020   Procedure: XI ROBOTIC ASSISTED TOTAL HYSTERECTOMY GREATER THAN TWO HUNDRED AND FIFTY GRAMS WITH BILATERAL SALPINGO  OOPHORECTOMY, MINI LAPAROTOMY;  Surgeon: Everitt Amber, MD;  Location: [facility];  Service: Gynecology;  Laterality: N/A;   SENTINEL NODE BIOPSY N/A 11/15/2020   Procedure: SENTINEL NODE BIOPSY;  Surgeon: Everitt Amber, MD;  Location: [facility];  Service: Gynecology;  Laterality: N/A;   TUBAL LIGATION  04/14/1985     OB History     Gravida  3   Para  2   Term  2   Preterm      AB  1   Living  2      SAB  1   IAB      Ectopic      Multiple      Live Births              Family History  Problem Relation Age of Onset   Diabetes Mother    Hypertension Mother    Heart disease Mother        CHF   Stroke Mother    Heart disease Father    Colon cancer Brother    Breast cancer Neg Hx    Ovarian cancer Neg Hx    Endometrial cancer Neg Hx    Prostate cancer Neg Hx    Pancreatic cancer Neg Hx     Social History   Tobacco Use   Smoking status: Former    Packs/day: 0.50    Years: 30.00    Pack years: 15.00    Types: Cigarettes    Quit date: 01/01/2020    Years since quitting: 1.0    Passive exposure: Never   Smokeless tobacco: Never  Vaping Use   Vaping Use: Never used  Substance Use Topics   Alcohol use: No   Drug use: Yes    Frequency: 6.0 times per week    Types: Marijuana    Comment: every now and then    Home Medications Prior to Admission medications   Medication Sig Start Date End Date Taking? Authorizing Provider  albuterol (VENTOLIN HFA) 108 (90 Base) MCG/ACT inhaler Inhale 2 puffs into the lungs every 6 (six) hours as needed for wheezing or shortness of breath. 10/09/20  Yes [name], NP  budesonide-formoterol (SYMBICORT) 80-4.5 MCG/ACT inhaler Inhale 2 puffs into the lungs 2 (two) times daily. 10/09/20  Yes [name], NP  calcium carbonate (TUMS - DOSED IN MG ELEMENTAL CALCIUM) 500 MG chewable tablet Chew 1 tablet by mouth as needed for indigestion or heartburn.   Yes [provider]  CARBOPLATIN  IV Inject into the vein every 21 ( twenty-one) days. 01/14/21  Yes [provider]  lidocaine-prilocaine (EMLA) cream Apply small amount to port a cath site and cover with plastic wrap 1 hour prior to chemotherapy appointments 01/23/21  Yes Derek Jack, MD  loratadine (CLARITIN) 10 MG tablet Take 1 tablet (10 mg total) by mouth daily. 01/23/21  Yes Derek Jack, MD  Multiple Vitamin (MULTIVITAMIN WITH MINERALS) TABS tablet Take 1 tablet by mouth daily.   Yes [provider]  PACLITAXEL IV Inject into the vein every  21 ( twenty-one) days. 01/14/21  Yes [provider]  pantoprazole (PROTONIX) 40 MG tablet Take 1 tablet (40 mg total) by mouth daily. 01/25/21  Yes Daleen Bo, MD  Pegfilgrastim-cbqv [facility]) Inject into the skin every 21 ( twenty-one) days. Administer on Day 3 of each chemotherapy cycle 01/16/21  Yes [provider]  prochlorperazine (COMPAZINE) 10 MG tablet Take 1 tablet (10 mg total) by mouth every 6 (six) hours as needed for nausea or vomiting. 01/23/21  Yes Derek Jack, MD  vitamin B-12 (CYANOCOBALAMIN) 500 MCG tablet Take 500 mcg by mouth daily.   Yes [provider]  ibuprofen (ADVIL) 800 MG tablet Take 1 tablet (800 mg total) by mouth every 8 (eight) hours as needed for moderate pain. For AFTER surgery only Patient not taking: No sig reported 10/24/20   Everitt Amber, MD  ondansetron (ZOFRAN ODT) 8 MG disintegrating tablet Take 1 tablet (8 mg total) by mouth every 8 (eight) hours as needed for nausea or vomiting. Patient not taking: No sig reported 10/10/20   Florian Buff, MD  senna-docusate (SENOKOT-S) 8.6-50 MG tablet Take 2 tablets by mouth at bedtime. For AFTER surgery, do not take if having diarrhea Patient not taking: Reported on 01/25/2021 10/24/20   Everitt Amber, MD  traMADol (ULTRAM) 50 MG tablet Take 1 tablet (50 mg total) by mouth every 6 (six) hours as needed for severe pain. For AFTER surgery only,  do not take and drive Patient not taking: No sig reported 10/24/20   Everitt Amber, MD    Allergies    Codeine  Review of Systems   Review of Systems  All other systems reviewed and are negative.  Physical Exam Updated Vital Signs BP 115/69   Pulse 91   Temp 98.2 F (36.8 C) (Oral)   Resp 17   Ht 5\' 2"  (1.575 m)   Wt 96.1 kg   LMP 04/14/2010   SpO2 99%   BMI 38.74 kg/m   Physical Exam Vitals and nursing note reviewed.  Constitutional:      General: She is not in acute distress.    Appearance: She is well-developed. She is not ill-appearing or toxic-appearing.  HENT:     Head: Normocephalic and atraumatic.     Right Ear: External ear normal.     Left Ear: External ear normal.  Eyes:     Conjunctiva/sclera: Conjunctivae normal.     Pupils: Pupils are equal, round, and reactive to light.  Neck:     Trachea: Phonation normal.  Cardiovascular:     Rate and Rhythm: Normal rate and regular rhythm.     Heart sounds: Normal heart sounds.     Comments: Hypotensive at time of initial evaluation. Pulmonary:     Effort: Pulmonary effort is normal. No respiratory distress.     Breath sounds: Normal breath sounds. No stridor.  Chest:     Chest wall: No tenderness.  Abdominal:     General: There is no distension.     Palpations: Abdomen is soft.     Tenderness: There is no abdominal tenderness.  Musculoskeletal:        General: No swelling or tenderness. Normal range of motion.     Cervical back: Normal range of motion and neck supple.  Skin:    General: Skin is warm and dry.  Neurological:     Mental Status: She is alert and oriented to person, place, and time.     Cranial Nerves: No cranial nerve deficit.  Sensory: No sensory deficit.     Motor: No abnormal muscle tone.     Coordination: Coordination normal.     Comments: No dysarthria, aphasia or nystagmus.  Psychiatric:        Mood and Affect: Mood normal.        Behavior: Behavior normal.        Thought  Content: Thought content normal.        Judgment: Judgment normal.    ED Results / Procedures / Treatments   Labs (all labs ordered are listed, but only abnormal results are displayed) Labs Reviewed  CBC WITH DIFFERENTIAL/PLATELET - Abnormal; Notable for the following components:      Result Value   RBC 2.85 (*)    Hemoglobin 8.3 (*)    HCT 25.7 (*)    All other components within normal limits  BASIC METABOLIC PANEL - Abnormal; Notable for the following components:   Glucose, Bld 136 (*)    All other components within normal limits  TROPONIN I (HIGH SENSITIVITY)  TROPONIN I (HIGH SENSITIVITY)    EKG EKG Interpretation  Date/Time:  Friday January 25 2021 06:18:05 EDT Ventricular Rate:  80 PR Interval:  130 QRS Duration: 89 QT Interval:  380 QTC Calculation: 439 R Axis:   39 Text Interpretation: Sinus rhythm Abnormal R-wave progression, early transition No significant change since last tracing Confirmed by Ripley Fraise [phone]) on 01/25/2021 6:21:51 AM  Radiology DG Chest Port 1 View  Result Date: 01/25/2021 CLINICAL DATA:  Chest pain EXAM: PORTABLE CHEST 1 VIEW COMPARISON:  11/12/2020 FINDINGS: Porta catheter with tip at the upper cavoatrial junction. Normal heart size and mediastinal contours. No acute infiltrate or edema. No effusion or pneumothorax. No acute osseous findings. IMPRESSION: No active disease. Electronically Signed   By: Jorje Guild M.D.   On: 01/25/2021 06:53    Procedures .Critical Care Performed by: Daleen Bo, MD Authorized by: Daleen Bo, MD   Critical care provider statement:    Critical care time (minutes):  35   Critical care start time:  01/25/2021 7:00 AM   Critical care end time:  01/25/2021 12:30 PM   Critical care time was exclusive of:  Separately billable procedures and treating other patients   Critical care was time spent personally by me on the following activities:  Blood draw for specimens, development of treatment  plan with patient or surrogate, discussions with consultants, evaluation of patient's response to treatment, examination of patient, ordering and performing treatments and interventions, ordering and review of laboratory studies, ordering and review of radiographic studies, pulse oximetry, re-evaluation of patient's condition and review of old charts   Medications Ordered in ED Medications  sodium chloride 0.9 % bolus 500 mL (0 mLs Intravenous Stopped 01/25/21 5956)    ED Course  I have reviewed the triage vital signs and the nursing notes.  Pertinent labs & imaging results that were available during my care of the patient were reviewed by me and considered in my medical decision making (see chart for details).    MDM Rules/Calculators/A&P HEAR Score: 2                          Patient Vitals for the past 24 hrs:  BP Temp Temp src Pulse Resp SpO2 Height Weight  01/25/21 1200 115/69 -- -- 91 17 99 % -- --  01/25/21 1130 106/60 -- -- 92 18 100 % -- --  01/25/21 1100 97/62 -- -- 100  18 100 % -- --  01/25/21 1030 (!) 99/53 -- -- 81 17 100 % -- --  01/25/21 1000 (!) 97/58 -- -- 85 16 100 % -- --  01/25/21 0930 (!) 105/57 -- -- 85 19 100 % -- --  01/25/21 0915 105/83 -- -- 100 16 100 % -- --  01/25/21 0830 -- -- -- 98 -- 100 % -- --  01/25/21 0815 -- -- -- 94 -- 97 % -- --  01/25/21 0807 109/61 -- -- 77 16 100 % -- --  01/25/21 0800 -- -- -- -- 20 -- -- --  01/25/21 0745 -- -- -- 87 (!) 25 100 % -- --  01/25/21 0730 (!) 97/53 -- -- 82 18 100 % -- --  01/25/21 0715 -- -- -- 84 (!) 24 99 % -- --  01/25/21 0700 (!) 99/57 -- -- 76 (!) 25 100 % -- --  01/25/21 0645 -- -- -- 85 (!) 24 99 % -- --  01/25/21 0630 123/71 -- -- 86 (!) 22 100 % -- --  01/25/21 0627 -- -- -- 82 20 100 % -- --  01/25/21 0617 115/76 98.2 F (36.8 C) Oral 84 16 100 % -- --  01/25/21 0615 -- -- -- -- -- -- 5\' 2"  (1.575 m) 96.1 kg    12:21 PM Reevaluation with update and discussion. After initial assessment and  treatment, an updated evaluation reveals she does states she feels better and has a burning sensation in the center upper chest.  Earlier, she took Tums for the same kind of pain.  She does not have a history of GERD or reflux.  Findings discussed with the patient and all questions were answered. Daleen Bo   Medical Decision Making:  This patient is presenting for evaluation of nonspecific chest pain, which does require a range of treatment options, and is a complaint that involves a moderate risk of morbidity and mortality. The differential diagnoses include hypovolemia, acute illness including sepsis, metabolic disorder, and complications from chemotherapy or cancer. I decided to review old records, and in summary elderly female currently being treated with chemotherapy for uterine cancer.  She does not have a cardiac history.  She has history of COPD.  She presents to the ED with hypotension.  I did not require additional historical information from anyone.  Clinical Laboratory Tests Ordered, included CBC, Metabolic panel, and delta troponin . Review indicates normal except hemoglobin low, glucose high. Radiologic Tests Ordered, included chest x-ray.  I independently Visualized: Radiographic images, which show no acute abnormalities  Cardiac Monitor Tracing which shows normal sinus rhythm    Critical Interventions-clinical evaluation, laboratory testing, radiography, delta troponin, IV fluid bolus for hypotension, observation and reassessment  After These Interventions, the Patient was reevaluated and was found stable for discharge.  Nonspecific pain in chest, with negative evaluation low risk for worsening condition.  Suspect gastritis/esophagitis causing heartburn symptoms or muscular versus chest wall pain.  No indication for hospitalization or further ED evaluation at this time.  Symptomatic care is indicated.  Incidental mild hypotension improved with treatment, IV fluid bolus.  Doubt  hemodynamic instability.  CRITICAL CARE-yes Performed by: Daleen Bo  Nursing Notes Reviewed/ Care Coordinated Applicable Imaging Reviewed Interpretation of Laboratory Data incorporated into ED treatment  The patient appears reasonably screened and/or stabilized for discharge and I doubt any other medical condition or other [facility] requiring further screening, evaluation, or treatment in the ED at this time prior to discharge.  Plan: Home Medications-continue  usual, Tylenol for pain; Home Treatments-rest, fluids; return here if the recommended treatment, does not improve the symptoms; Recommended follow up-PCP, as needed     Final Clinical Impression(s) / ED Diagnoses Final diagnoses:  Hypotension, unspecified hypotension type  Heartburn    Rx / DC Orders ED Discharge Orders          Ordered    pantoprazole (PROTONIX) 40 MG tablet  Daily        01/25/21 1223             Daleen Bo, MD 01/26/21 1147

## 2021-01-25 NOTE — ED Triage Notes (Signed)
Pt c/o left sided chest pain with radiation to left arm. Pt also c/o nausea and vomiting related to pain.

## 2021-01-25 NOTE — Discharge Instructions (Addendum)
Your symptoms are most likely caused from heartburn.  We sent a prescription for Protonix to your pharmacy.  Start taking that to improve your symptoms.  Also take a liquid antacid such as Mylanta, 2 tablespoons, before meals and at bedtime for 1 week while the Protonix begins to work.

## 2021-02-05 ENCOUNTER — Inpatient Hospital Stay (HOSPITAL_COMMUNITY): Payer: 59 | Admitting: General Practice

## 2021-02-05 DIAGNOSIS — C541 Malignant neoplasm of endometrium: Secondary | ICD-10-CM

## 2021-02-05 NOTE — Progress Notes (Signed)
[facility] CSW Progress Notes  Call to patient, she is not available so CSW spoke w daughter who is at work and has the only working phone in the family.  Patient has been ill all week, unclear whether it is related to cancer treatments or illness circulating in community.  Per daughter, she sometimes get "depressed" or in a low mood related to her diagnosis.  She will stay`in her room and isolate when in a low mood, will also stay in room when not feeling well.  She relates well to her 77 year old granddaughter and does confide in her.  She is having transportation needs - working daughter has to miss work in order to transport when needed.  CSW has referred to [employer] - provided number to schedule rides and encouraged use.  Patient has applied for SSDI, has interview later this month.  She has received a call from [facility], inviting her to activities at their center.  Encouraged daughter to encourage patient to participate in things she feels up to.  Also advised that we can assess for more support needs if depression seems to be worsening.  Food is not an issue per daughter - family has enough resources to support this.    Edwyna Shell, LCSW Clinical Social Worker Phone:  [phone]

## 2021-02-07 ENCOUNTER — Encounter (HOSPITAL_COMMUNITY): Payer: Self-pay | Admitting: *Deleted

## 2021-02-07 NOTE — Progress Notes (Signed)
PA completed for Lidocaine cream for port application on [id number]/9767.  Confirmed with [facility] pharmacy that this was approved for the patient.

## 2021-02-12 NOTE — Progress Notes (Signed)
[place], [place] [address]   CLINIC:  Medical Oncology/Hematology  PCP:  [name], NP [address]  [address] [place] [address] / [place] [address] [phone]   REASON FOR VISIT:  Follow-up for endometrial cancer  PRIOR THERAPY: none  NGS Results: not done  CURRENT THERAPY: Carboplatin and Taxol every 3 weeks  BRIEF ONCOLOGIC HISTORY:  Oncology History  Endometrial cancer ([facility]ichele Romero)  11/15/2020 Initial Diagnosis   Endometrial cancer ([place])   01/23/2021 -  Chemotherapy   Patient is on Treatment Plan : UTERINE Carboplatin AUC 6 / Paclitaxel q21d       CANCER STAGING: Cancer Staging Endometrial cancer ([place]) Staging form: Corpus Uteri - Carcinoma and Carcinosarcoma, AJCC 8th Edition - Clinical stage from 12/31/2020: FIGO Stage IIIC1 (cT1a, cN23m, cM0) - Unsigned   INTERVAL HISTORY:  [name] a 60y.o. female, returns for routine follow-up and consideration for next cycle of chemotherapy. CMarrahwas last seen on 01/23/2021.  Due for cycle #2 of Carboplatin and Taxol today.   Overall, she tells me she has been feeling pretty well. She reports worsened intermittent numbness and tingling in her fingertips and feet; she denies any associated pain, but reports occasional droppings objects and she is unable to open bottle caps. She does not currently take potassium or magnesium. She presented to the ED on 10/14 for CP which has not recurred. She denies diarrhea and vomiting, but she reports occasional nausea after eating for 1 week following treatment. She also reports constipation, but she is not currently taking stool softener. She denies leg pains, leg swellings, and trouble swallowing. Her appetite is good. She reports mild fatigue and left abdominal pain.   Overall, she feels ready for next cycle of chemo today.   REVIEW OF SYSTEMS:  Review of Systems  Constitutional:  Positive for fatigue (50%). Negative for appetite change  (60%).  HENT:   Negative for trouble swallowing.   Respiratory:  Positive for shortness of breath (COPD).   Cardiovascular:  Negative for chest pain (resolved) and leg swelling.  Gastrointestinal:  Positive for abdominal pain (L side), constipation and nausea. Negative for diarrhea and vomiting.  Neurological:  Positive for dizziness and numbness (hands and feet).  Psychiatric/Behavioral:  Positive for depression and sleep disturbance.   All other systems reviewed and are negative.  PAST MEDICAL/SURGICAL HISTORY:  Past Medical History:  Diagnosis Date   Ambulates with cane 10/31/2020   Arthritis    RIGHT HIP   Asthma    Cigarette nicotine dependence without complication [id number]  Complex tear of medial meniscus of right knee 01/20/2017   COPD (chronic obstructive pulmonary disease) ([facility]    ENDOMETRIAL CANCER 10/31/2020   [place]ometrial cancer ([facility] 11/15/2020   GERD (gastroesophageal reflux disease)    Migraines    Port-A-Cath in place 01/01/2021   Past Surgical History:  Procedure Laterality Date   APPENDECTOMY     YRS AGO PER PT ON 10-31-2020   BIOPSY  11/29/2019   Procedure: BIOPSY;  Surgeon: CHarvel Quale MD;  [facility];  Service: Gastroenterology;;  ileocecal vlve   CHOLECYSTECTOMY     YRS AGO PER PT ON 10-31-2020   COLONOSCOPY WITH PROPOFOL N/A 11/29/2019   Procedure: COLONOSCOPY WITH PROPOFOL;  Surgeon: CHarvel Quale MD;  [facility];  Service: Gastroenterology;  Laterality: N/A;  930   HYSTEROSCOPY WITH D & C N/A 10/10/2020   Procedure: DILATATION AND CURETTAGE /HYSTEROSCOPY;  Surgeon: Florian Buff, MD;  Location: [facility];  Service: Gynecology;  Laterality: N/A;   IR IMAGING GUIDED PORT INSERTION  01/08/2021   KNEE ARTHROPLASTY  2018   left knee   POLYPECTOMY  11/29/2019   Procedure: POLYPECTOMY;  Surgeon: Montez Morita, Quillian Quince, MD;  [facility];  Service: Gastroenterology;;  sigmoid colon    ROBOTIC  ASSISTED TOTAL HYSTERECTOMY WITH BILATERAL SALPINGO OOPHERECTOMY N/A 11/15/2020   Procedure: XI ROBOTIC ASSISTED TOTAL HYSTERECTOMY GREATER THAN TWO HUNDRED AND FIFTY GRAMS WITH BILATERAL SALPINGO OOPHORECTOMY, MINI LAPAROTOMY;  Surgeon: Everitt Amber, MD;  Location: [facility];  Service: Gynecology;  Laterality: N/A;   SENTINEL NODE BIOPSY N/A 11/15/2020   Procedure: SENTINEL NODE BIOPSY;  Surgeon: Everitt Amber, MD;  Location: [facility];  Service: Gynecology;  Laterality: N/A;   TUBAL LIGATION  04/14/1985    SOCIAL HISTORY:  Social History   Socioeconomic History   Marital status: Single    Spouse name: Not on file   Number of children: 2   Years of education: Not on file   Highest education level: High school graduate  Occupational History   Not on file  Tobacco Use   Smoking status: Former    Packs/day: 0.50    Years: 30.00    Pack years: 15.00    Types: Cigarettes    Quit date: 01/01/2020    Years since quitting: 1.1    Passive exposure: Never   Smokeless tobacco: Never  Vaping Use   Vaping Use: Never used  Substance and Sexual Activity   Alcohol use: No   Drug use: Yes    Frequency: 6.0 times per week    Types: Marijuana    Comment: every now and then   Sexual activity: Not Currently    Birth control/protection: Surgical    Comment: tubal  Other Topics Concern   Not on file  Social History Narrative   Lives with daughter and 1 grandbaby   Son is in [place]      Enjoys: sitting outside, music, tv      Diet: eats all food groups   Caffeine: soda and tea: 2-3 cups daily   Water: 2-3 16 oz bottles      Wears seat belt    Does not use phone while driving   Oceanographer at home    No weapons    Social Determinants of Health   Financial Resource Strain: High Risk   Difficulty of Paying Living Expenses: Hard  Food Insecurity: No Food Insecurity   Worried About Charity fundraiser in the Last Year: Never true   Arboriculturist in the  Last Year: Never true  Transportation Needs: Unmet Transportation Needs   Lack of Transportation (Medical): Yes   Lack of Transportation (Non-Medical): Yes  Physical Activity: Not on file  Stress: Stress Concern Present   Feeling of Stress : To some extent  Social Connections: Unknown   Frequency of Communication with Friends and Family: More than three times a week   Frequency of Social Gatherings with Friends and Family: More than three times a week   Attends Religious Services: More than 4 times per year   Active Member of [employer] or Organizations: Not on file   Attends Archivist Meetings: Not on file   Marital Status: Not on file  Intimate Partner Violence: Not on file    FAMILY HISTORY:  Family History  Problem Relation Age of Onset  Diabetes Mother    Hypertension Mother    Heart disease Mother        CHF   Stroke Mother    Heart disease Father    Colon cancer Brother    Breast cancer Neg Hx    Ovarian cancer Neg Hx    Endometrial cancer Neg Hx    Prostate cancer Neg Hx    Pancreatic cancer Neg Hx     CURRENT MEDICATIONS:  Current Outpatient Medications  Medication Sig Dispense Refill   albuterol (VENTOLIN HFA) 108 (90 Base) MCG/ACT inhaler Inhale 2 puffs into the lungs every 6 (six) hours as needed for wheezing or shortness of breath. 8 g 2   budesonide-formoterol (SYMBICORT) 80-4.5 MCG/ACT inhaler Inhale 2 puffs into the lungs 2 (two) times daily. 10.2 g 2   calcium carbonate (TUMS - DOSED IN MG ELEMENTAL CALCIUM) 500 MG chewable tablet Chew 1 tablet by mouth as needed for indigestion or heartburn.     CARBOPLATIN IV Inject into the vein every 21 ( twenty-one) days.     ibuprofen (ADVIL) 800 MG tablet Take 1 tablet (800 mg total) by mouth every 8 (eight) hours as needed for moderate pain. For AFTER surgery only (Patient not taking: No sig reported) 30 tablet 0   lidocaine-prilocaine (EMLA) cream Apply small amount to port a cath site and cover with plastic  wrap 1 hour prior to chemotherapy appointments 30 g 3   loratadine (CLARITIN) 10 MG tablet Take 1 tablet (10 mg total) by mouth daily. 30 tablet 2   Multiple Vitamin (MULTIVITAMIN WITH MINERALS) TABS tablet Take 1 tablet by mouth daily.     ondansetron (ZOFRAN ODT) 8 MG disintegrating tablet Take 1 tablet (8 mg total) by mouth every 8 (eight) hours as needed for nausea or vomiting. (Patient not taking: No sig reported) 8 tablet 0   PACLITAXEL IV Inject into the vein every 21 ( twenty-one) days.     pantoprazole (PROTONIX) 40 MG tablet Take 1 tablet (40 mg total) by mouth daily. 30 tablet 1   Pegfilgrastim-cbqv (UDENYCA [place]) Inject into the skin every 21 ( twenty-one) days. Administer on Day 3 of each chemotherapy cycle     prochlorperazine (COMPAZINE) 10 MG tablet Take 1 tablet (10 mg total) by mouth every 6 (six) hours as needed for nausea or vomiting. 30 tablet 3   senna-docusate (SENOKOT-S) 8.6-50 MG tablet Take 2 tablets by mouth at bedtime. For AFTER surgery, do not take if having diarrhea (Patient not taking: Reported on 01/25/2021) 30 tablet 0   traMADol (ULTRAM) 50 MG tablet Take 1 tablet (50 mg total) by mouth every 6 (six) hours as needed for severe pain. For AFTER surgery only, do not take and drive (Patient not taking: No sig reported) 10 tablet 0   vitamin B-12 (CYANOCOBALAMIN) 500 MCG tablet Take 500 mcg by mouth daily.     No current facility-administered medications for this visit.    ALLERGIES:  Allergies  Allergen Reactions   Codeine Nausea And Vomiting and Rash    PHYSICAL EXAM:  Performance status (ECOG): 1 - Symptomatic but completely ambulatory  There were no vitals filed for this visit. Wt Readings from Last 3 Encounters:  01/25/21 211 lb 12.8 oz (96.1 kg)  01/23/21 211 lb 12.8 oz (96.1 kg)  01/23/21 211 lb (95.7 kg)   Physical Exam Vitals reviewed.  Constitutional:      Appearance: Normal appearance.  Cardiovascular:     Rate and Rhythm: Normal rate and  regular rhythm.     Pulses: Normal pulses.     Heart sounds: Normal heart sounds.  Pulmonary:     Effort: Pulmonary effort is normal.     Breath sounds: Normal breath sounds.  Neurological:     General: No focal deficit present.     Mental Status: She is alert and oriented to person, place, and time.  Psychiatric:        Mood and Affect: Mood normal.        Behavior: Behavior normal.    LABORATORY DATA:  I have reviewed the labs as listed.  CBC Latest Ref Rng & Units 01/25/2021 01/23/2021 11/12/2020  WBC 4.0 - 10.5 K/uL 7.4 11.4(H) 5.0  Hemoglobin 12.0 - 15.0 g/dL 8.3(L) 9.2(L) 11.6(L)  Hematocrit 36.0 - 46.0 % 25.7(L) 28.0(L) 35.8(L)  Platelets 150 - 400 K/uL 257 312 238   CMP Latest Ref Rng & Units 01/25/2021 01/23/2021 11/12/2020  Glucose 70 - 99 mg/dL 136(H) 127(H) 91  BUN 8 - 23 mg/dL _0 Creatinine 0.44 - 1.00 mg/dL 0.71 0.56 0.73  Sodium 135 - 145 mmol/L 138 138 142  Potassium 3.5 - 5.1 mmol/L 3.6 3.3(L) 4.2  Chloride 98 - 111 mmol/L 106 104 110  CO2 22 - 32 mmol/L _1 Calcium 8.9 - 10.3 mg/dL 9.0 8.6(L) 8.5(L)  Total Protein 6.5 - 8.1 g/dL - 6.9 6.9  Total Bilirubin 0.3 - 1.2 mg/dL - 0.7 0.5  Alkaline Phos 38 - 126 U/L - 56 71  AST 15 - 41 U/L - 16 14(L)  ALT 0 - 44 U/L - 9 11    DIAGNOSTIC IMAGING:  I have independently reviewed the scans and discussed with the patient. DG Chest Port 1 View  Result Date: 01/25/2021 CLINICAL DATA:  Chest pain EXAM: PORTABLE CHEST 1 VIEW COMPARISON:  11/12/2020 FINDINGS: Porta catheter with tip at the upper cavoatrial junction. Normal heart size and mediastinal contours. No acute infiltrate or edema. No effusion or pneumothorax. No acute osseous findings. IMPRESSION: No active disease. Electronically Signed   By: Jorje Guild M.D.   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 - Endometrial biopsy on 10/10/2020  consistent with carcinosarcoma, MMR preserved. - CT scan of the abdomen and pelvis with contrast on 10/29/2020 with endometrial mass, no compelling findings of metastatic disease in the abdomen or pelvis.  30 cm fatty lesion intraluminally in the distal transverse colon probably lipoma. - Robotic assisted laparoscopic total hysterectomy with bilateral salpingo-oophorectomy and sentinel lymph node biopsy by Dr. Denman George on 11/15/2020. - Pathology consistent with carcinosarcoma spanning 8.5 cm, tumor invades less than one half of myometrium, LVI positive, 1 lymph node involved with micrometastasis, PT1APN1 MI, MMR preserved, MSI-stable - She was evaluated by Dr. Denman George and 6 cycles of adjuvant chemotherapy with carboplatin and paclitaxel with vaginal brachytherapy was recommended. - Cycle 1 of carboplatin and paclitaxel on 01/23/2021.  2.  Social/family history: - Lives at home with her daughter and granddaughter.  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  She quit smoking in September 2021.  Half pack per day smoker for 30 years.  She has severe right hip arthritis, requiring her to use a cane for ambulation. - No family history of malignancies.   PLAN:  Stage III C1 (PT1APN1A) endometrial carcinosarcoma: -She received cycle 1 on 01/23/2021. -  She reported nausea lasted about a week without vomiting. - She also had some constipation for which she took Dulcolax. - She was evaluated in the ER with chest pain on 01/25/2021 which subsided. - We reviewed labs today which showed normal LFTs.  CBC shows grossly normal white count and platelet count. - We will cut back on the dose of paclitaxel to 150 mg/m2 secondary to neuropathy. - Proceed with cycle 2 today.  RTC 4 weeks for follow-up.  2.  Normocytic anemia: - Anemia labs on 01/23/2021 with ferritin 245 and percent saturation 6.  C76 and folic acid normal. - Hemoglobin today is 8.6.  Etiology is  myelosuppression from chemotherapy.  3.  Hypomagnesemia: - Magnesium today is 1.4.  She will receive 4 g of IV magnesium. - We will start her on magnesium oxide twice daily.  4.  Hypokalemia: - Potassium today is 2.9.  She will receive 40 mEq of potassium. - We will start her on potassium 20 mEq daily.  5.  Peripheral neuropathy: - She reported some tingling and numbness on and off in the fingertips and the bottom of the feet since her first cycle. - She has difficulty opening bottle caps.  We will cut back on the paclitaxel dose today.   Orders placed this encounter:  No orders of the defined types were placed in this encounter.    Derek Jack, MD [place] [phone]   I, Thana Ates, am acting as a scribe for Dr. Derek Jack.  I, Derek Jack MD, have reviewed the above documentation for accuracy and completeness, and I agree with the above.

## 2021-02-13 ENCOUNTER — Inpatient Hospital Stay (HOSPITAL_COMMUNITY): Payer: 59

## 2021-02-13 ENCOUNTER — Inpatient Hospital Stay (HOSPITAL_BASED_OUTPATIENT_CLINIC_OR_DEPARTMENT_OTHER): Payer: 59 | Admitting: Hematology

## 2021-02-13 ENCOUNTER — Inpatient Hospital Stay (HOSPITAL_COMMUNITY): Payer: 59 | Attending: Hematology

## 2021-02-13 ENCOUNTER — Other Ambulatory Visit: Payer: Self-pay

## 2021-02-13 VITALS — BP 149/97 | HR 94 | Temp 98.2°F | Resp 20 | Wt 208.2 lb

## 2021-02-13 DIAGNOSIS — G629 Polyneuropathy, unspecified: Secondary | ICD-10-CM | POA: Diagnosis not present

## 2021-02-13 DIAGNOSIS — Z5111 Encounter for antineoplastic chemotherapy: Secondary | ICD-10-CM | POA: Diagnosis not present

## 2021-02-13 DIAGNOSIS — E876 Hypokalemia: Secondary | ICD-10-CM | POA: Insufficient documentation

## 2021-02-13 DIAGNOSIS — C541 Malignant neoplasm of endometrium: Secondary | ICD-10-CM | POA: Diagnosis not present

## 2021-02-13 DIAGNOSIS — D6481 Anemia due to antineoplastic chemotherapy: Secondary | ICD-10-CM | POA: Insufficient documentation

## 2021-02-13 DIAGNOSIS — Z5189 Encounter for other specified aftercare: Secondary | ICD-10-CM | POA: Insufficient documentation

## 2021-02-13 DIAGNOSIS — Z23 Encounter for immunization: Secondary | ICD-10-CM | POA: Insufficient documentation

## 2021-02-13 DIAGNOSIS — Z95828 Presence of other vascular implants and grafts: Secondary | ICD-10-CM

## 2021-02-13 LAB — COMPREHENSIVE METABOLIC PANEL
ALT: 10 U/L (ref 0–44)
AST: 13 U/L — ABNORMAL LOW (ref 15–41)
Albumin: 2.9 g/dL — ABNORMAL LOW (ref 3.5–5.0)
Alkaline Phosphatase: 67 U/L (ref 38–126)
Anion gap: 8 (ref 5–15)
BUN: 10 mg/dL (ref 8–23)
CO2: 28 mmol/L (ref 22–32)
Calcium: 8.5 mg/dL — ABNORMAL LOW (ref 8.9–10.3)
Chloride: 105 mmol/L (ref 98–111)
Creatinine, Ser: 0.64 mg/dL (ref 0.44–1.00)
GFR, Estimated: >60 mL/min (ref >60)
Glucose, Bld: 131 mg/dL — ABNORMAL HIGH (ref 70–99)
Potassium: 2.9 mmol/L — ABNORMAL LOW (ref 3.5–5.1)
Sodium: 141 mmol/L (ref 135–145)
Total Bilirubin: 0.2 mg/dL — ABNORMAL LOW (ref 0.3–1.2)
Total Protein: 7.1 g/dL (ref 6.5–8.1)

## 2021-02-13 LAB — CBC WITH DIFFERENTIAL/PLATELET
Abs Immature Granulocytes: 0.04 10*3/uL (ref 0.00–0.07)
Basophils Absolute: 0.1 10*3/uL (ref 0.0–0.1)
Basophils Relative: 1 %
Eosinophils Absolute: 0.1 10*3/uL (ref 0.0–0.5)
Eosinophils Relative: 1 %
HCT: 27.3 % — ABNORMAL LOW (ref 36.0–46.0)
Hemoglobin: 8.6 g/dL — ABNORMAL LOW (ref 12.0–15.0)
Immature Granulocytes: 1 %
Lymphocytes Relative: 23 %
Lymphs Abs: 1.9 10*3/uL (ref 0.7–4.0)
MCH: 27.9 pg (ref 26.0–34.0)
MCHC: 31.5 g/dL (ref 30.0–36.0)
MCV: 88.6 fL (ref 80.0–100.0)
Monocytes Absolute: 0.5 10*3/uL (ref 0.1–1.0)
Monocytes Relative: 7 %
Neutro Abs: 5.5 10*3/uL (ref 1.7–7.7)
Neutrophils Relative %: 67 %
Platelets: 329 10*3/uL (ref 150–400)
RBC: 3.08 MIL/uL — ABNORMAL LOW (ref 3.87–5.11)
RDW: 15.4 % (ref 11.5–15.5)
WBC: 8.1 10*3/uL (ref 4.0–10.5)
nRBC: 0 % (ref 0.0–0.2)

## 2021-02-13 LAB — MAGNESIUM: Magnesium: 1.4 mg/dL — ABNORMAL LOW (ref 1.7–2.4)

## 2021-02-13 MED ORDER — HEPARIN SOD (PORK) LOCK FLUSH 100 UNIT/ML IV SOLN
500.0000 [IU] | Freq: Once | INTRAVENOUS | Status: AC | PRN
  Administered 2021-02-13: 500 [IU]

## 2021-02-13 MED ORDER — MAGNESIUM SULFATE 4 GM/100ML IV SOLN: 4.0000 g | Freq: Once | INTRAVENOUS | Status: DC

## 2021-02-13 MED ORDER — SODIUM CHLORIDE 0.9 % IV SOLN
150.0000 mg | Freq: Once | INTRAVENOUS | Status: AC
  Administered 2021-02-13: 150 mg via INTRAVENOUS
  Filled 2021-02-13: qty 150

## 2021-02-13 MED ORDER — SODIUM CHLORIDE 0.9% FLUSH
10.0000 mL | INTRAVENOUS | Status: DC | PRN
  Administered 2021-02-13: 10 mL

## 2021-02-13 MED ORDER — SODIUM CHLORIDE 0.9 % IV SOLN
10.0000 mg | Freq: Once | INTRAVENOUS | Status: AC
  Administered 2021-02-13: 10 mg via INTRAVENOUS
  Filled 2021-02-13: qty 10

## 2021-02-13 MED ORDER — FAMOTIDINE 20 MG IN NS 100 ML IVPB
20.0000 mg | Freq: Once | INTRAVENOUS | Status: AC
  Administered 2021-02-13: 20 mg via INTRAVENOUS
  Filled 2021-02-13: qty 20

## 2021-02-13 MED ORDER — MAGNESIUM SULFATE 2 GM/50ML IV SOLN
2.0000 g | INTRAVENOUS | Status: AC
  Administered 2021-02-13 (×2): 2 g via INTRAVENOUS
  Filled 2021-02-13: qty 50

## 2021-02-13 MED ORDER — MAGNESIUM OXIDE -MG SUPPLEMENT 400 (240 MG) MG PO TABS: 400.0000 mg | ORAL_TABLET | Freq: Two times a day (BID) | ORAL | 3 refills | Status: DC

## 2021-02-13 MED ORDER — SODIUM CHLORIDE 0.9 % IV SOLN
813.0000 mg | Freq: Once | INTRAVENOUS | Status: AC
  Administered 2021-02-13: 810 mg via INTRAVENOUS
  Filled 2021-02-13: qty 81

## 2021-02-13 MED ORDER — SODIUM CHLORIDE 0.9 % IV SOLN: Freq: Once | INTRAVENOUS | Status: AC

## 2021-02-13 MED ORDER — POTASSIUM CHLORIDE CRYS ER 20 MEQ PO TBCR
40.0000 meq | EXTENDED_RELEASE_TABLET | Freq: Once | ORAL | Status: AC
  Administered 2021-02-13: 40 meq via ORAL
  Filled 2021-02-13: qty 2

## 2021-02-13 MED ORDER — DIPHENHYDRAMINE HCL 50 MG/ML IJ SOLN
50.0000 mg | Freq: Once | INTRAMUSCULAR | Status: AC
  Administered 2021-02-13: 50 mg via INTRAVENOUS
  Filled 2021-02-13: qty 1

## 2021-02-13 MED ORDER — POTASSIUM CHLORIDE CRYS ER 20 MEQ PO TBCR: 20.0000 meq | EXTENDED_RELEASE_TABLET | Freq: Every day | ORAL | 3 refills | Status: DC

## 2021-02-13 MED ORDER — SODIUM CHLORIDE 0.9 % IV SOLN
150.0000 mg/m2 | Freq: Once | INTRAVENOUS | Status: AC
  Administered 2021-02-13: 306 mg via INTRAVENOUS
  Filled 2021-02-13: qty 51

## 2021-02-13 MED ORDER — PALONOSETRON HCL INJECTION 0.25 MG/5ML
0.2500 mg | Freq: Once | INTRAVENOUS | Status: AC
  Administered 2021-02-13: 0.25 mg via INTRAVENOUS
  Filled 2021-02-13: qty 5

## 2021-02-13 NOTE — Patient Instructions (Signed)
[place] at [facility] Discharge Instructions  You were seen and examined today by Dr. Delton Coombes.  Your potassium and magnesium were low today - we sent prescriptions for you to take.  Take these as prescribed.  Increase the amount of protein in your diet.  Return as scheduled in 4 weeks for lab work and treatment.    Thank you for choosing [place] at [facility] to provide your oncology and hematology care.  To afford each patient quality time with our provider, please arrive at least 15 minutes before your scheduled appointment time.   If you have a lab appointment with the [place] [facility] please come in thru the Main Entrance and check in at the main information desk.  You need to re-schedule your appointment should you arrive 10 or more minutes late.  We strive to give you quality time with our providers, and arriving late affects you and other patients whose appointments are after yours.  Also, if you no show three or more times for appointments you may be dismissed from the clinic at the providers discretion.     Again, thank you for choosing [facility].  Our hope is that these requests will decrease the amount of time that you wait before being seen by our physicians.       _____________________________________________________________  Should you have questions after your visit to [facility], please contact our office at [phone] and follow the prompts.  Our office hours are 8:00 a.m. and 4:30 p.m. Monday - Friday.  Please note that voicemails left after 4:00 p.m. may not be returned until the following business day.  We are closed weekends and major holidays.  You do have access to a nurse 24-7, just call the main number to the clinic [phone] and do not press any options, hold on the line and a nurse will answer the phone.    For prescription refill requests, have your pharmacy contact our office  and allow 72 hours.    Due to Covid, you will need to wear a mask upon entering the hospital. If you do not have a mask, a mask will be given to you at the Main Entrance upon arrival. For doctor visits, patients may have 1 support person age 69 or older with them. For treatment visits, patients can not have anyone with them due to social distancing guidelines and our immunocompromised population.

## 2021-02-13 NOTE — Progress Notes (Signed)
Patients port flushed without difficulty.  Good blood return noted with no bruising or swelling noted at site.  Patient remains accessed for chemotherapy treatment.  

## 2021-02-13 NOTE — Progress Notes (Signed)
Patient presents today for Taxol/Carbo infusion per providers order.  Vital signs within parameters for treatment.  Patient has no new complaints at this time.  Taxol and Carboplatin given today per MD orders.  Stable during infusion without adverse affects.  Vital signs stable.  No complaints at this time.  Discharge from clinic ambulatory in stable condition.  Alert and oriented X 3.  Follow up with [facility] as scheduled.

## 2021-02-13 NOTE — Patient Instructions (Signed)
[facility]  Discharge Instructions: Thank you for choosing [facility] to provide your oncology and hematology care.  If you have a lab appointment with the Cancer Center, please come in thru the Main Entrance and check in at the main information desk.  Wear comfortable clothing and clothing appropriate for easy access to any Portacath or PICC line.   We strive to give you quality time with your provider. You may need to reschedule your appointment if you arrive late (15 or more minutes).  Arriving late affects you and other patients whose appointments are after yours.  Also, if you miss three or more appointments without notifying the office, you may be dismissed from the clinic at the provider's discretion.      For prescription refill requests, have your pharmacy contact our office and allow 72 hours for refills to be completed.    Today you received the following chemotherapy and/or immunotherapy agents Taxol/Carboplatin.       To help prevent nausea and vomiting after your treatment, we encourage you to take your nausea medication as directed.  BELOW ARE SYMPTOMS THAT SHOULD BE REPORTED IMMEDIATELY: *FEVER GREATER THAN 100.4 F (38 C) OR HIGHER *CHILLS OR SWEATING *NAUSEA AND VOMITING THAT IS NOT CONTROLLED WITH YOUR NAUSEA MEDICATION *UNUSUAL SHORTNESS OF BREATH *UNUSUAL BRUISING OR BLEEDING *URINARY PROBLEMS (pain or burning when urinating, or frequent urination) *BOWEL PROBLEMS (unusual diarrhea, constipation, pain near the anus) TENDERNESS IN MOUTH AND THROAT WITH OR WITHOUT PRESENCE OF ULCERS (sore throat, sores in mouth, or a toothache) UNUSUAL RASH, SWELLING OR PAIN  UNUSUAL VAGINAL DISCHARGE OR ITCHING   Items with * indicate a potential emergency and should be followed up as soon as possible or go to the Emergency Department if any problems should occur.  Please show the CHEMOTHERAPY ALERT CARD or IMMUNOTHERAPY ALERT CARD at check-in to the  Emergency Department and triage nurse.  Should you have questions after your visit or need to cancel or reschedule your appointment, please contact [facility] [phone]  and follow the prompts.  Office hours are 8:00 a.m. to 4:30 p.m. Monday - Friday. Please note that voicemails left after 4:00 p.m. may not be returned until the following business day.  We are closed weekends and major holidays. You have access to a nurse at all times for urgent questions. Please call the main number to the clinic [phone] and follow the prompts.  For any non-urgent questions, you may also contact your provider using MyChart. We now offer e-Visits for anyone 18 and older to request care online for non-urgent symptoms. For details visit [email or web address].   Also download the MyChart app! Go to the app store, search "MyChart", open the app, select [place], and log in with your MyChart username and password.  Due to Covid, a mask is required upon entering the hospital/clinic. If you do not have a mask, one will be given to you upon arrival. For doctor visits, patients may have 1 support person aged 18 or older with them. For treatment visits, patients cannot have anyone with them due to current Covid guidelines and our immunocompromised population.  

## 2021-02-13 NOTE — Progress Notes (Signed)
Patient has been examined, vital signs and labs have been reviewed by Dr. Katragadda. ANC, Creatinine, LFTs, hemoglobin, and platelets are within treatment parameters per Dr. Katragadda. Patient may proceed with treatment per M.D.   

## 2021-02-15 ENCOUNTER — Other Ambulatory Visit: Payer: Self-pay

## 2021-02-15 ENCOUNTER — Encounter (HOSPITAL_COMMUNITY): Payer: Self-pay

## 2021-02-15 ENCOUNTER — Inpatient Hospital Stay (HOSPITAL_COMMUNITY): Payer: 59

## 2021-02-15 VITALS — BP 135/79 | HR 97 | Temp 97.5°F | Resp 19

## 2021-02-15 DIAGNOSIS — Z5111 Encounter for antineoplastic chemotherapy: Secondary | ICD-10-CM | POA: Diagnosis not present

## 2021-02-15 DIAGNOSIS — Z95828 Presence of other vascular implants and grafts: Secondary | ICD-10-CM

## 2021-02-15 DIAGNOSIS — C541 Malignant neoplasm of endometrium: Secondary | ICD-10-CM

## 2021-02-15 MED ORDER — PEGFILGRASTIM-CBQV 6 MG/0.6ML ~~LOC~~ SOSY
6.0000 mg | PREFILLED_SYRINGE | Freq: Once | SUBCUTANEOUS | Status: AC
  Administered 2021-02-15: 6 mg via SUBCUTANEOUS
  Filled 2021-02-15: qty 0.6

## 2021-02-15 NOTE — Progress Notes (Signed)
Michele Romero presents today for injection per the provider's orders.  Udenyca  administration without incident; injection site WNL; see MAR for injection details.  Patient tolerated procedure well and without incident.  No questions or complaints noted at this time. Discharged from clinic ambulatory in stable condition. Alert and oriented x 3. F/U with [facility] as scheduled.

## 2021-02-15 NOTE — Patient Instructions (Signed)
[facility]  Discharge Instructions: Thank you for choosing [facility] to provide your oncology and hematology care.  If you have a lab appointment with the Cancer Center, please come in thru the Main Entrance and check in at the main information desk.  Wear comfortable clothing and clothing appropriate for easy access to any Portacath or PICC line.   We strive to give you quality time with your provider. You may need to reschedule your appointment if you arrive late (15 or more minutes).  Arriving late affects you and other patients whose appointments are after yours.  Also, if you miss three or more appointments without notifying the office, you may be dismissed from the clinic at the provider's discretion.      For prescription refill requests, have your pharmacy contact our office and allow 72 hours for refills to be completed.    Today you received the following:  Udenyca injection      To help prevent nausea and vomiting after your treatment, we encourage you to take your nausea medication as directed.  BELOW ARE SYMPTOMS THAT SHOULD BE REPORTED IMMEDIATELY: *FEVER GREATER THAN 100.4 F (38 C) OR HIGHER *CHILLS OR SWEATING *NAUSEA AND VOMITING THAT IS NOT CONTROLLED WITH YOUR NAUSEA MEDICATION *UNUSUAL SHORTNESS OF BREATH *UNUSUAL BRUISING OR BLEEDING *URINARY PROBLEMS (pain or burning when urinating, or frequent urination) *BOWEL PROBLEMS (unusual diarrhea, constipation, pain near the anus) TENDERNESS IN MOUTH AND THROAT WITH OR WITHOUT PRESENCE OF ULCERS (sore throat, sores in mouth, or a toothache) UNUSUAL RASH, SWELLING OR PAIN  UNUSUAL VAGINAL DISCHARGE OR ITCHING   Items with * indicate a potential emergency and should be followed up as soon as possible or go to the Emergency Department if any problems should occur.  Please show the CHEMOTHERAPY ALERT CARD or IMMUNOTHERAPY ALERT CARD at check-in to the Emergency Department and triage  nurse.  Should you have questions after your visit or need to cancel or reschedule your appointment, please contact [facility] [phone]  and follow the prompts.  Office hours are 8:00 a.m. to 4:30 p.m. Monday - Friday. Please note that voicemails left after 4:00 p.m. may not be returned until the following business day.  We are closed weekends and major holidays. You have access to a nurse at all times for urgent questions. Please call the main number to the clinic [phone] and follow the prompts.  For any non-urgent questions, you may also contact your provider using MyChart. We now offer e-Visits for anyone 18 and older to request care online for non-urgent symptoms. For details visit [email or web address].   Also download the MyChart app! Go to the app store, search "MyChart", open the app, select [place], and log in with your MyChart username and password.  Due to Covid, a mask is required upon entering the hospital/clinic. If you do not have a mask, one will be given to you upon arrival. For doctor visits, patients may have 1 support person aged 18 or older with them. For treatment visits, patients cannot have anyone with them due to current Covid guidelines and our immunocompromised population.  

## 2021-02-22 ENCOUNTER — Telehealth: Payer: Self-pay | Admitting: *Deleted

## 2021-02-22 NOTE — Telephone Encounter (Signed)
CALLED PATIENT;S SISTER, B. PINKARD TO HAVE HER SISTER TO CALL ME - REGARDING HER APPTS. FOR HDR [facility], LVM FOR A RETURN CALL

## 2021-02-25 ENCOUNTER — Telehealth: Payer: Self-pay | Admitting: *Deleted

## 2021-02-25 NOTE — Progress Notes (Incomplete)
Michele Romero is here today for HDR [facility] fitting and treatment.   Does the patient complain of any of the following:  Pain:*** Abdominal bloating: *** Diarrhea/Constipation: *** Nausea/Vomiting: *** Vaginal Discharge: *** Blood in Urine or Stool: *** Urinary Issues (dysuria/incomplete emptying/ incontinence/ increased frequency/urgency/nocturia): ***   Additional comments if applicable: ***  ***

## 2021-02-25 NOTE — Telephone Encounter (Signed)
CALLED PATIENT'S MOTHER (CLARA HAIRSTON) AND LEFT A MESSAGE WITH HER TO TELL HER DAUGHTER - Michele Romero TO GIVE ME A CALL, PATIENT'S MOM SAID THAT SHE WOULD BE GLAD TO DO THAT

## 2021-02-26 ENCOUNTER — Telehealth: Payer: Self-pay | Admitting: *Deleted

## 2021-02-26 ENCOUNTER — Ambulatory Visit: Payer: PRIVATE HEALTH INSURANCE | Admitting: Radiation Oncology

## 2021-02-26 NOTE — Telephone Encounter (Signed)
CALLED PATIENT TO ASK ABOUT COMING FOR NEW HDR [place] ON 02-28-21, SPOKE WITH PATIENT AND SHE AGREED TO DO SO.

## 2021-02-27 ENCOUNTER — Telehealth: Payer: Self-pay

## 2021-02-27 NOTE — Telephone Encounter (Signed)
Attempted to set up transportation services for patient for up coming appointment on 11/17. Received message from transportation dept making me aware that they were unsuccessful in contacting the patient to confirm pick up. Called patients daughter to ask if she could have patient call me back. Daughter also given Biomedical engineer number. Daughter agreed to reach out to patient. Awaiting call back.

## 2021-02-27 NOTE — Progress Notes (Incomplete)
°  Radiation Oncology         [phone] ________________________________  Name: Michele Romero MRN: [id number]  Date: 02/28/2021  DOB: 1961-02-04  CC: [name], NP  Everitt Amber, MD  HDR BRACHYTHERAPY NOTE  DIAGNOSIS: The encounter diagnosis was Endometrial cancer [facility]).   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    Simple treatment device note: Patient had construction of her custom vaginal cylinder. She will be treated with a *** cm diameter segmented cylinder. This conforms to her anatomy without undue discomfort.  Vaginal brachytherapy procedure node: The patient was brought to the [place] suite. Identity was confirmed. All relevant records and images related to the planned course of therapy were reviewed. The patient freely provided informed written consent to proceed with treatment after reviewing the details related to the planned course of therapy. The consent form was witnessed and verified by the simulation staff. Then, the patient was set-up in a stable reproducible supine position for radiation therapy. Pelvic exam revealed the vaginal cuff to be intact ***. The patient's custom vaginal cylinder was placed in the proximal vagina. This was affixed to the CT/MR stabilization plate to prevent slippage. Patient tolerated the placement well.  Verification simulation note:  A fiducial marker was placed within the vaginal cylinder. An AP and lateral film was then obtained through the pelvis area. This documented accurate position of the vaginal cylinder for treatment.  HDR BRACHYTHERAPY TREATMENT  The remote afterloading device was affixed to the vaginal cylinder by catheter. Patient then proceeded to undergo her first high-dose-rate treatment directed at the proximal vagina. The patient was prescribed a dose of *** gray to be delivered to the mucosal surface. Treatment length was *** cm. Patient was treated with *** channel using *** dwell positions. Treatment  time was *** seconds. Iridium 192 was the high-dose-rate source for treatment. The patient tolerated the treatment well. After completion of her therapy, a radiation survey was performed documenting return of the iridium source into the [employer] safe.   PLAN: The patient will return *** for her second high-dose-rate treatment. ________________________________  -----------------------------------  Blair Promise, PhD, MD  This document serves as a record of services personally performed by Gery Pray, MD. It was created on his behalf by Roney Mans, a trained medical scribe. The creation of this record is based on the scribe's personal observations and the provider's statements to them. This document has been checked and approved by the attending provider.

## 2021-02-27 NOTE — Progress Notes (Incomplete)
Radiation Oncology         [phone] ________________________________  Name: Michele Romero MRN: [id number]  Date: 02/28/2021  DOB: [birth year]  Vaginal Brachytherapy Procedure Note  CC: Michele Larsson, NP Michele Amber, MD  No diagnosis found.  Diagnosis: The encounter diagnosis was Endometrial cancer ([place]).   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   Radiation Treatment Dates: ***  Narrative: She returns today for vaginal cylinder fitting. ***  ALLERGIES: is allergic to codeine.  Meds: Current Outpatient Medications  Medication Sig Dispense Refill   albuterol (VENTOLIN HFA) 108 (90 Base) MCG/ACT inhaler Inhale 2 puffs into the lungs every 6 (six) hours as needed for wheezing or shortness of breath. 8 g 2   budesonide-formoterol (SYMBICORT) 80-4.5 MCG/ACT inhaler Inhale 2 puffs into the lungs 2 (two) times daily. 10.2 g 2   calcium carbonate (TUMS - DOSED IN MG ELEMENTAL CALCIUM) 500 MG chewable tablet Chew 1 tablet by mouth as needed for indigestion or heartburn.     CARBOPLATIN IV Inject into the vein every 21 ( twenty-one) days.     ibuprofen (ADVIL) 800 MG tablet Take 1 tablet (800 mg total) by mouth every 8 (eight) hours as needed for moderate pain. For AFTER surgery only 30 tablet 0   lidocaine-prilocaine (EMLA) cream Apply small amount to port a cath site and cover with plastic wrap 1 hour prior to chemotherapy appointments 30 g 3   loratadine (CLARITIN) 10 MG tablet Take 1 tablet (10 mg total) by mouth daily. 30 tablet 2   magnesium oxide (MAG-OX) 400 (240 Mg) MG tablet Take 1 tablet (400 mg total) by mouth 2 (two) times daily. 60 tablet 3   Multiple Vitamin (MULTIVITAMIN WITH MINERALS) TABS tablet Take 1 tablet by mouth daily.     ondansetron (ZOFRAN ODT) 8 MG disintegrating tablet Take 1 tablet (8 mg total) by mouth every 8 (eight) hours as needed for nausea or vomiting. 8 tablet 0   PACLITAXEL IV Inject into the vein every 21 ( twenty-one)  days.     pantoprazole (PROTONIX) 40 MG tablet Take 1 tablet (40 mg total) by mouth daily. 30 tablet 1   Pegfilgrastim-cbqv (UDENYCA [place]) Inject into the skin every 21 ( twenty-one) days. Administer on Day 3 of each chemotherapy cycle     potassium chloride SA (KLOR-CON) 20 MEQ tablet Take 1 tablet (20 mEq total) by mouth daily. 30 tablet 3   prochlorperazine (COMPAZINE) 10 MG tablet Take 1 tablet (10 mg total) by mouth every 6 (six) hours as needed for nausea or vomiting. 30 tablet 3   senna-docusate (SENOKOT-S) 8.6-50 MG tablet Take 2 tablets by mouth at bedtime. For AFTER surgery, do not take if having diarrhea 30 tablet 0   traMADol (ULTRAM) 50 MG tablet Take 1 tablet (50 mg total) by mouth every 6 (six) hours as needed for severe pain. For AFTER surgery only, do not take and drive 10 tablet 0   vitamin B-12 (CYANOCOBALAMIN) 500 MCG tablet Take 500 mcg by mouth daily.     No current facility-administered medications for this encounter.    Physical Findings: The patient is in no acute distress. Patient is alert and oriented.  vitals were not taken for this visit.   No palpable cervical, supraclavicular or axillary lymphoadenopathy. The heart has a regular rate and rhythm. The lungs are clear to auscultation. Abdomen soft and non-tender.  On pelvic examination the external genitalia were unremarkable. A speculum exam was performed.  Vaginal cuff intact, no mucosal lesions. On bimanual exam there were no pelvic masses appreciated. ***  Lab Findings: Lab Results  Component Value Date   WBC 8.1 02/13/2021   HGB 8.6 (L) 02/13/2021   HCT 27.3 (L) 02/13/2021   MCV 88.6 02/13/2021   PLT 329 02/13/2021    Radiographic Findings: No results found.  Impression: The encounter diagnosis was Endometrial cancer ([place]).   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   Patient was fitted for a vaginal cylinder. The patient will be treated with a *** cm diameter cylinder with a  treatment length of *** cm. This distended the vaginal vault without undue discomfort. The patient tolerated the procedure well.  The patient was successfully fitted for a vaginal cylinder. The patient is appropriate to begin vaginal brachytherapy.   Plan: The patient will proceed with CT simulation and vaginal brachytherapy today.    _______________________________   Blair Promise, PhD, MD  This document serves as a record of services personally performed by Gery Pray, MD. It was created on his behalf by Roney Mans, a trained medical scribe. The creation of this record is based on the scribe's personal observations and the provider's statements to them. This document has been checked and approved by the attending provider.

## 2021-02-28 ENCOUNTER — Ambulatory Visit
Admission: RE | Admit: 2021-02-28 | Discharge: 2021-02-28 | Disposition: A | Discharge status: Home / Self Care | Payer: PRIVATE HEALTH INSURANCE | Source: Ambulatory Visit | Attending: Radiation Oncology | Admitting: Radiation Oncology

## 2021-02-28 ENCOUNTER — Ambulatory Visit: Payer: PRIVATE HEALTH INSURANCE | Admitting: Radiation Oncology

## 2021-03-05 ENCOUNTER — Ambulatory Visit: Payer: PRIVATE HEALTH INSURANCE | Admitting: Radiation Oncology

## 2021-03-12 ENCOUNTER — Ambulatory Visit: Payer: PRIVATE HEALTH INSURANCE | Admitting: Radiation Oncology

## 2021-03-12 NOTE — Progress Notes (Addendum)
[place], [place] [address]   CLINIC:  Medical Oncology/Hematology  PCP:  [name], NP [address]  [address] / [place] [address] [phone]   REASON FOR VISIT:  Follow-up for endometrial cancer  PRIOR THERAPY: none  NGS Results: not done  CURRENT THERAPY: Carboplatin and Taxol every 3 weeks  BRIEF ONCOLOGIC HISTORY:  Oncology History  Endometrial cancer ([place])  11/15/2020 Initial Diagnosis   Endometrial cancer ([place])   01/23/2021 -  Chemotherapy   Patient is on Treatment Plan : UTERINE Carboplatin AUC 6 / Paclitaxel q21d       CANCER STAGING:  Cancer Staging  Endometrial cancer ([place]) Staging form: Corpus Uteri - Carcinoma and Carcinosarcoma, AJCC 8th Edition - Clinical stage from 12/31/2020: FIGO Stage IIIC1 (cT1a, cN79m, cM0) - Unsigned   INTERVAL HISTORY:  Ms. CCELINES FEMIA a 60y.o. female, returns for routine follow-up and consideration for next cycle of chemotherapy. [place] last seen on 02/13/2021.  Due for cycle #3 of Carboplatin and Taxol today.   Overall, she tells me she has been feeling pretty well. She reports constant numbness in her hands and feet which continues to cause difficulty opening bottle caps, but she denies any associated pain. She denies n/v/d. She denies abdominal pain and fevers. She reports urinary incontinence, urgency, frequency, and urine with strong odor. Her appetite is good.  Overall, she feels ready for next cycle of chemo today.   REVIEW OF SYSTEMS:  Review of Systems  Constitutional:  Negative for appetite change (70%), fatigue (50%) and fever.  Respiratory:  Positive for shortness of breath.   Gastrointestinal:  Negative for abdominal pain, diarrhea, nausea and vomiting.  Genitourinary:  Positive for bladder incontinence and frequency.   Neurological:  Positive for numbness (constant in hands and feet).  All other systems reviewed and are negative.  PAST  MEDICAL/SURGICAL HISTORY:  Past Medical History:  Diagnosis Date   Ambulates with cane 10/31/2020   Arthritis    RIGHT HIP   Asthma    Cigarette nicotine dependence without complication [id number]  Complex tear of medial meniscus of right knee 01/20/2017   COPD (chronic obstructive pulmonary disease) ([place]    ENDOMETRIAL CANCER 10/31/2020   Endometrial cancer ([facility] 11/15/2020   GERD (gastroesophageal reflux disease)    Migraines    Port-A-Cath in place 01/01/2021   Past Surgical History:  Procedure Laterality Date   APPENDECTOMY     YRS AGO PER PT ON 10-31-2020   BIOPSY  11/29/2019   Procedure: BIOPSY;  Surgeon: CHarvel Quale MD;  [facility];  Service: Gastroenterology;;  ileocecal vlve   CHOLECYSTECTOMY     YRS AGO PER PT ON 10-31-2020   COLONOSCOPY WITH PROPOFOL N/A 11/29/2019   Procedure: COLONOSCOPY WITH PROPOFOL;  Surgeon: CHarvel Quale MD;  [facility];  Service: Gastroenterology;  Laterality: N/A;  930   HYSTEROSCOPY WITH D & C N/A 10/10/2020   Procedure: DILATATION AND CURETTAGE /HYSTEROSCOPY;  Surgeon: EFlorian Buff MD;  Location: [facility];  Service: Gynecology;  Laterality: N/A;   IR IMAGING GUIDED PORT INSERTION  01/08/2021   KNEE ARTHROPLASTY  2018   left knee   POLYPECTOMY  11/29/2019   Procedure: POLYPECTOMY;  Surgeon: CHarvel Quale MD;  [facility];  Service: Gastroenterology;;  sigmoid colon    ROBOTIC ASSISTED TOTAL HYSTERECTOMY WITH BILATERAL SALPINGO OOPHERECTOMY N/A 11/15/2020   Procedure: XI ROBOTIC ASSISTED  TOTAL HYSTERECTOMY GREATER THAN TWO HUNDRED AND FIFTY GRAMS WITH BILATERAL SALPINGO OOPHORECTOMY, MINI LAPAROTOMY;  Surgeon: Everitt Amber, MD;  Location: [facility];  Service: Gynecology;  Laterality: N/A;   SENTINEL NODE BIOPSY N/A 11/15/2020   Procedure: SENTINEL NODE BIOPSY;  Surgeon: Everitt Amber, MD;  Location: [facility];  Service: Gynecology;   Laterality: N/A;   TUBAL LIGATION  04/14/1985    SOCIAL HISTORY:  Social History   Socioeconomic History   Marital status: Single    Spouse name: Not on file   Number of children: 2   Years of education: Not on file   Highest education level: High school graduate  Occupational History   Not on file  Tobacco Use   Smoking status: Former    Packs/day: 0.50    Years: 30.00    Pack years: 15.00    Types: Cigarettes    Quit date: 01/01/2020    Years since quitting: 1.1    Passive exposure: Never   Smokeless tobacco: Never  Vaping Use   Vaping Use: Never used  Substance and Sexual Activity   Alcohol use: No   Drug use: Yes    Frequency: 6.0 times per week    Types: Marijuana    Comment: every now and then   Sexual activity: Not Currently    Birth control/protection: Surgical    Comment: tubal  Other Topics Concern   Not on file  Social History Narrative   Lives with daughter and 1 grandbaby   Son is in [place]      Enjoys: sitting outside, music, tv      Diet: eats all food groups   Caffeine: soda and tea: 2-3 cups daily   Water: 2-3 16 oz bottles      Wears seat belt    Does not use phone while driving   Oceanographer at home    No weapons    Social Determinants of Health   Financial Resource Strain: High Risk   Difficulty of Paying Living Expenses: Hard  Food Insecurity: No Food Insecurity   Worried About Charity fundraiser in the Last Year: Never true   Arboriculturist in the Last Year: Never true  Transportation Needs: Unmet Transportation Needs   Lack of Transportation (Medical): Yes   Lack of Transportation (Non-Medical): Yes  Physical Activity: Not on file  Stress: Stress Concern Present   Feeling of Stress : To some extent  Social Connections: Unknown   Frequency of Communication with Friends and Family: More than three times a week   Frequency of Social Gatherings with Friends and Family: More than three times a week   Attends Religious Services:  More than 4 times per year   Active Member of [employer] or Organizations: Not on file   Attends Music therapist: Not on file   Marital Status: Not on file  Intimate Partner Violence: Not on file    FAMILY HISTORY:  Family History  Problem Relation Age of Onset   Diabetes Mother    Hypertension Mother    Heart disease Mother        CHF   Stroke Mother    Heart disease Father    Colon cancer Brother    Breast cancer Neg Hx    Ovarian cancer Neg Hx    Endometrial cancer Neg Hx    Prostate cancer Neg Hx    Pancreatic cancer Neg Hx     CURRENT MEDICATIONS:  Current Outpatient Medications  Medication Sig Dispense Refill   albuterol (VENTOLIN HFA) 108 (90 Base) MCG/ACT inhaler Inhale 2 puffs into the lungs every 6 (six) hours as needed for wheezing or shortness of breath. 8 g 2   budesonide-formoterol (SYMBICORT) 80-4.5 MCG/ACT inhaler Inhale 2 puffs into the lungs 2 (two) times daily. 10.2 g 2   calcium carbonate (TUMS - DOSED IN MG ELEMENTAL CALCIUM) 500 MG chewable tablet Chew 1 tablet by mouth as needed for indigestion or heartburn.     CARBOPLATIN IV Inject into the vein every 21 ( twenty-one) days.     ibuprofen (ADVIL) 800 MG tablet Take 1 tablet (800 mg total) by mouth every 8 (eight) hours as needed for moderate pain. For AFTER surgery only 30 tablet 0   lidocaine-prilocaine (EMLA) cream Apply small amount to port a cath site and cover with plastic wrap 1 hour prior to chemotherapy appointments 30 g 3   loratadine (CLARITIN) 10 MG tablet Take 1 tablet (10 mg total) by mouth daily. 30 tablet 2   magnesium oxide (MAG-OX) 400 (240 Mg) MG tablet Take 1 tablet (400 mg total) by mouth 2 (two) times daily. 60 tablet 3   Multiple Vitamin (MULTIVITAMIN WITH MINERALS) TABS tablet Take 1 tablet by mouth daily.     ondansetron (ZOFRAN ODT) 8 MG disintegrating tablet Take 1 tablet (8 mg total) by mouth every 8 (eight) hours as needed for nausea or vomiting. 8 tablet 0   PACLITAXEL  IV Inject into the vein every 21 ( twenty-one) days.     pantoprazole (PROTONIX) 40 MG tablet Take 1 tablet (40 mg total) by mouth daily. 30 tablet 1   Pegfilgrastim-cbqv (UDENYCA [place]) Inject into the skin every 21 ( twenty-one) days. Administer on Day 3 of each chemotherapy cycle     potassium chloride SA (KLOR-CON) 20 MEQ tablet Take 1 tablet (20 mEq total) by mouth daily. 30 tablet 3   prochlorperazine (COMPAZINE) 10 MG tablet Take 1 tablet (10 mg total) by mouth every 6 (six) hours as needed for nausea or vomiting. 30 tablet 3   senna-docusate (SENOKOT-S) 8.6-50 MG tablet Take 2 tablets by mouth at bedtime. For AFTER surgery, do not take if having diarrhea 30 tablet 0   traMADol (ULTRAM) 50 MG tablet Take 1 tablet (50 mg total) by mouth every 6 (six) hours as needed for severe pain. For AFTER surgery only, do not take and drive 10 tablet 0   vitamin B-12 (CYANOCOBALAMIN) 500 MCG tablet Take 500 mcg by mouth daily.     No current facility-administered medications for this visit.    ALLERGIES:  Allergies  Allergen Reactions   Codeine Nausea And Vomiting and Rash    PHYSICAL EXAM:  Performance status (ECOG): 1 - Symptomatic but completely ambulatory  There were no vitals filed for this visit. Wt Readings from Last 3 Encounters:  02/13/21 208 lb 3.2 oz (94.4 kg)  01/25/21 211 lb 12.8 oz (96.1 kg)  01/23/21 211 lb 12.8 oz (96.1 kg)   Physical Exam Vitals reviewed.  Constitutional:      Appearance: Normal appearance.  Cardiovascular:     Rate and Rhythm: Normal rate and regular rhythm.     Pulses: Normal pulses.     Heart sounds: Normal heart sounds.  Pulmonary:     Effort: Pulmonary effort is normal.     Breath sounds: Normal breath sounds.  Neurological:     General: No focal deficit present.     Mental Status:  She is alert and oriented to person, place, and time.  Psychiatric:        Mood and Affect: Mood normal.        Behavior: Behavior normal.    LABORATORY DATA:  I  have reviewed the labs as listed.  CBC Latest Ref Rng & Units 02/13/2021 01/25/2021 01/23/2021  WBC 4.0 - 10.5 K/uL 8.1 7.4 11.4(H)  Hemoglobin 12.0 - 15.0 g/dL 8.6(L) 8.3(L) 9.2(L)  Hematocrit 36.0 - 46.0 % 27.3(L) 25.7(L) 28.0(L)  Platelets 150 - 400 K/uL 329 257 312   CMP Latest Ref Rng & Units 02/13/2021 01/25/2021 01/23/2021  Glucose 70 - 99 mg/dL 131(H) 136(H) 127(H)  BUN 8 - 23 mg/dL 10 20 15   Creatinine 0.44 - 1.00 mg/dL 0.64 0.71 0.56  Sodium 135 - 145 mmol/L 141 138 138  Potassium 3.5 - 5.1 mmol/L 2.9(L) 3.6 3.3(L)  Chloride 98 - 111 mmol/L 105 106 104  CO2 22 - 32 mmol/L 28 25 26   Calcium 8.9 - 10.3 mg/dL 8.5(L) 9.0 8.6(L)  Total Protein 6.5 - 8.1 g/dL 7.1 - 6.9  Total Bilirubin 0.3 - 1.2 mg/dL 0.2(L) - 0.7  Alkaline Phos 38 - 126 U/L 67 - 56  AST 15 - 41 U/L 13(L) - 16  ALT 0 - 44 U/L 10 - 9    DIAGNOSTIC IMAGING:  I have independently reviewed the scans and discussed with the patient. No results found.   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 - Endometrial biopsy on 10/10/2020 consistent with carcinosarcoma, MMR preserved. - CT scan of the abdomen and pelvis with contrast on 10/29/2020 with endometrial mass, no compelling findings of metastatic disease in the abdomen or pelvis.  30 cm fatty lesion intraluminally in the distal transverse colon probably lipoma. - Robotic assisted laparoscopic total hysterectomy with bilateral salpingo-oophorectomy and sentinel lymph node biopsy by Dr. Denman George on 11/15/2020. - Pathology consistent with carcinosarcoma spanning 8.5 cm, tumor invades less than one half of myometrium, LVI positive, 1 lymph node involved with micrometastasis, PT1APN1 MI, MMR preserved, MSI-stable - She was evaluated by Dr. Denman George and 6 cycles of adjuvant chemotherapy with carboplatin and paclitaxel with vaginal brachytherapy was recommended. - Cycle 1 of carboplatin and paclitaxel on  01/23/2021.  2.  Social/family history: - Lives at home with her daughter and granddaughter.  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  She quit smoking in September 2021.  Half pack per day smoker for 30 years.  She has severe right hip arthritis, requiring her to use a cane for ambulation. - No family history of malignancies.   PLAN:  Stage III C1 (PT1APN1A) endometrial carcinosarcoma: - She did not experience any major side effects from last chemotherapy.  She reports dysuria and urgency. - We checked a UA which showed moderate leukocytosis. - We will start her on Cipro 500 mg twice daily. - Review of labs shows normal LFTs and CBC. - We will proceed with cycle 3 treatment today.  RTC 3 weeks for follow-up.  2.  Normocytic anemia: - Hemoglobin today is 9.8.  Anemia labs on 01/23/2021 with ferritin 245 and percent saturation 6.  M62 folic acid was normal.  Anemia most likely from myelosuppression.  3.  Hypomagnesemia: - Continue magnesium twice daily.  Magnesium today is 1.6.  4.  Hypokalemia: - Continue potassium 20 mEq daily.  Potassium today is normal.  5.  Peripheral  neuropathy: - She reports constant numbness in the fingertips and feet. - We will cut back on paclitaxel to 140 mg per metered square.   Orders placed this encounter:  No orders of the defined types were placed in this encounter.    Derek Jack, MD [facility] [phone]   I, Thana Ates, am acting as a scribe for Dr. Derek Jack.  I, Derek Jack MD, have reviewed the above documentation for accuracy and completeness, and I agree with the above.

## 2021-03-13 ENCOUNTER — Other Ambulatory Visit: Payer: Self-pay

## 2021-03-13 ENCOUNTER — Inpatient Hospital Stay (HOSPITAL_COMMUNITY): Payer: 59

## 2021-03-13 ENCOUNTER — Inpatient Hospital Stay (HOSPITAL_BASED_OUTPATIENT_CLINIC_OR_DEPARTMENT_OTHER): Payer: 59 | Admitting: Hematology

## 2021-03-13 VITALS — BP 120/83 | HR 97 | Temp 97.7°F | Resp 18

## 2021-03-13 VITALS — BP 114/76 | HR 121 | Temp 97.0°F | Resp 20 | Wt 197.8 lb

## 2021-03-13 DIAGNOSIS — C541 Malignant neoplasm of endometrium: Secondary | ICD-10-CM | POA: Diagnosis not present

## 2021-03-13 DIAGNOSIS — Z5111 Encounter for antineoplastic chemotherapy: Secondary | ICD-10-CM | POA: Diagnosis not present

## 2021-03-13 DIAGNOSIS — Z95828 Presence of other vascular implants and grafts: Secondary | ICD-10-CM

## 2021-03-13 DIAGNOSIS — R300 Dysuria: Secondary | ICD-10-CM | POA: Diagnosis not present

## 2021-03-13 DIAGNOSIS — Z23 Encounter for immunization: Secondary | ICD-10-CM

## 2021-03-13 LAB — COMPREHENSIVE METABOLIC PANEL
ALT: 12 U/L (ref 0–44)
AST: 17 U/L (ref 15–41)
Albumin: 3.4 g/dL — ABNORMAL LOW (ref 3.5–5.0)
Alkaline Phosphatase: 65 U/L (ref 38–126)
Anion gap: 8 (ref 5–15)
BUN: 13 mg/dL (ref 8–23)
CO2: 23 mmol/L (ref 22–32)
Calcium: 9.2 mg/dL (ref 8.9–10.3)
Chloride: 107 mmol/L (ref 98–111)
Creatinine, Ser: 0.72 mg/dL (ref 0.44–1.00)
GFR, Estimated: >60 mL/min (ref >60)
Glucose, Bld: 135 mg/dL — ABNORMAL HIGH (ref 70–99)
Potassium: 4 mmol/L (ref 3.5–5.1)
Sodium: 138 mmol/L (ref 135–145)
Total Bilirubin: 0.3 mg/dL (ref 0.3–1.2)
Total Protein: 7.3 g/dL (ref 6.5–8.1)

## 2021-03-13 LAB — URINALYSIS, MICROSCOPIC (REFLEX)
Bacteria, UA: NONE SEEN
Squamous Epithelial / HPF: NONE SEEN (ref 0–5)

## 2021-03-13 LAB — CBC WITH DIFFERENTIAL/PLATELET
Abs Immature Granulocytes: 0.02 10*3/uL (ref 0.00–0.07)
Basophils Absolute: 0 10*3/uL (ref 0.0–0.1)
Basophils Relative: 0 %
Eosinophils Absolute: 0.1 10*3/uL (ref 0.0–0.5)
Eosinophils Relative: 1 %
HCT: 31.8 % — ABNORMAL LOW (ref 36.0–46.0)
Hemoglobin: 9.8 g/dL — ABNORMAL LOW (ref 12.0–15.0)
Immature Granulocytes: 0 %
Lymphocytes Relative: 27 %
Lymphs Abs: 1.9 10*3/uL (ref 0.7–4.0)
MCH: 28 pg (ref 26.0–34.0)
MCHC: 30.8 g/dL (ref 30.0–36.0)
MCV: 90.9 fL (ref 80.0–100.0)
Monocytes Absolute: 0.7 10*3/uL (ref 0.1–1.0)
Monocytes Relative: 10 %
Neutro Abs: 4.3 10*3/uL (ref 1.7–7.7)
Neutrophils Relative %: 62 %
Platelets: 362 10*3/uL (ref 150–400)
RBC: 3.5 MIL/uL — ABNORMAL LOW (ref 3.87–5.11)
RDW: 20.2 % — ABNORMAL HIGH (ref 11.5–15.5)
WBC: 7 10*3/uL (ref 4.0–10.5)
nRBC: 0 % (ref 0.0–0.2)

## 2021-03-13 LAB — URINALYSIS, ROUTINE W REFLEX MICROSCOPIC
Bilirubin Urine: NEGATIVE
Glucose, UA: NEGATIVE mg/dL
Ketones, ur: NEGATIVE mg/dL
Nitrite: NEGATIVE
Protein, ur: NEGATIVE mg/dL
Specific Gravity, Urine: >1.03 — ABNORMAL HIGH (ref 1.005–1.030)
pH: 5.5 (ref 5.0–8.0)

## 2021-03-13 LAB — MAGNESIUM: Magnesium: 1.6 mg/dL — ABNORMAL LOW (ref 1.7–2.4)

## 2021-03-13 MED ORDER — SODIUM CHLORIDE 0.9% FLUSH
10.0000 mL | INTRAVENOUS | Status: DC | PRN
  Administered 2021-03-13: 10 mL

## 2021-03-13 MED ORDER — SODIUM CHLORIDE 0.9 % IV SOLN
813.0000 mg | Freq: Once | INTRAVENOUS | Status: AC
  Administered 2021-03-13: 810 mg via INTRAVENOUS
  Filled 2021-03-13: qty 81

## 2021-03-13 MED ORDER — CIPROFLOXACIN HCL 500 MG PO TABS
500.0000 mg | ORAL_TABLET | Freq: Two times a day (BID) | ORAL | 0 refills | Status: DC
Start: 2021-03-13 — End: 2021-05-08

## 2021-03-13 MED ORDER — FAMOTIDINE IN NACL 20-0.9 MG/50ML-% IV SOLN
20.0000 mg | Freq: Once | INTRAVENOUS | Status: AC
  Administered 2021-03-13: 20 mg via INTRAVENOUS
  Filled 2021-03-13: qty 50

## 2021-03-13 MED ORDER — SODIUM CHLORIDE 0.9 % IV SOLN
140.0000 mg/m2 | Freq: Once | INTRAVENOUS | Status: AC
  Administered 2021-03-13: 288 mg via INTRAVENOUS
  Filled 2021-03-13: qty 48

## 2021-03-13 MED ORDER — FAMOTIDINE 20 MG IN NS 100 ML IVPB: 20.0000 mg | Freq: Once | INTRAVENOUS | Status: DC

## 2021-03-13 MED ORDER — PALONOSETRON HCL INJECTION 0.25 MG/5ML
0.2500 mg | Freq: Once | INTRAVENOUS | Status: AC
  Administered 2021-03-13: 0.25 mg via INTRAVENOUS
  Filled 2021-03-13: qty 5

## 2021-03-13 MED ORDER — SODIUM CHLORIDE 0.9 % IV SOLN
10.0000 mg | Freq: Once | INTRAVENOUS | Status: AC
  Administered 2021-03-13: 10 mg via INTRAVENOUS
  Filled 2021-03-13: qty 10

## 2021-03-13 MED ORDER — SODIUM CHLORIDE 0.9 % IV SOLN
150.0000 mg | Freq: Once | INTRAVENOUS | Status: AC
  Administered 2021-03-13: 150 mg via INTRAVENOUS
  Filled 2021-03-13: qty 150

## 2021-03-13 MED ORDER — DIPHENHYDRAMINE HCL 50 MG/ML IJ SOLN
50.0000 mg | Freq: Once | INTRAMUSCULAR | Status: AC
  Administered 2021-03-13: 50 mg via INTRAVENOUS
  Filled 2021-03-13: qty 1

## 2021-03-13 MED ORDER — HEPARIN SOD (PORK) LOCK FLUSH 100 UNIT/ML IV SOLN
500.0000 [IU] | Freq: Once | INTRAVENOUS | Status: AC | PRN
  Administered 2021-03-13: 500 [IU]

## 2021-03-13 MED ORDER — SODIUM CHLORIDE 0.9 % IV SOLN: Freq: Once | INTRAVENOUS | Status: AC

## 2021-03-13 MED ORDER — MAGNESIUM SULFATE 2 GM/50ML IV SOLN
2.0000 g | Freq: Once | INTRAVENOUS | Status: AC
  Administered 2021-03-13: 2 g via INTRAVENOUS
  Filled 2021-03-13: qty 50

## 2021-03-13 MED ORDER — INFLUENZA VAC SPLIT QUAD 0.5 ML IM SUSY
0.5000 mL | PREFILLED_SYRINGE | Freq: Once | INTRAMUSCULAR | Status: AC
  Administered 2021-03-13: 0.5 mL via INTRAMUSCULAR
  Filled 2021-03-13: qty 0.5

## 2021-03-13 NOTE — Progress Notes (Signed)
Patient has been examined, vital signs and labs have been reviewed by Dr. Katragadda. ANC, Creatinine, LFTs, hemoglobin, and platelets are within treatment parameters per Dr. Katragadda. Patient may proceed with treatment per M.D.   

## 2021-03-13 NOTE — Patient Instructions (Signed)
[place] at [facility] Discharge Instructions   You were seen and examined today by Dr. Delton Coombes. We will proceed with your treatment today. Taxol chemo has been dose reduced due to the numbness in your hands. Continue taking magnesium and potassium as prescribed. Return as scheduled in 3 weeks for lab work, office visit, and treatment.    Thank you for choosing [place] at [facility] to provide your oncology and hematology care.  To afford each patient quality time with our provider, please arrive at least 15 minutes before your scheduled appointment time.   If you have a lab appointment with the [facility] please come in thru the Main Entrance and check in at the main information desk.  You need to re-schedule your appointment should you arrive 10 or more minutes late.  We strive to give you quality time with our providers, and arriving late affects you and other patients whose appointments are after yours.  Also, if you no show three or more times for appointments you may be dismissed from the clinic at the providers discretion.     Again, thank you for choosing [facility].  Our hope is that these requests will decrease the amount of time that you wait before being seen by our physicians.       _____________________________________________________________  Should you have questions after your visit to [facility], please contact our office at [phone] and follow the prompts.  Our office hours are 8:00 a.m. and 4:30 p.m. Monday - Friday.  Please note that voicemails left after 4:00 p.m. may not be returned until the following business day.  We are closed weekends and major holidays.  You do have access to a nurse 24-7, just call the main number to the clinic [phone] and do not press any options, hold on the line and a nurse will answer the phone.    For prescription refill requests, have your  pharmacy contact our office and allow 72 hours.    Due to Covid, you will need to wear a mask upon entering the hospital. If you do not have a mask, a mask will be given to you at the Main Entrance upon arrival. For doctor visits, patients may have 1 support person age 34 or older with them. For treatment visits, patients can not have anyone with them due to social distancing guidelines and our immunocompromised population.

## 2021-03-13 NOTE — Progress Notes (Signed)
Patient presents today for chemotherapy infusion.  Patient is in satisfactory condition with no new complaints voiced.  Vital signs are stable.  Labs reviewed by Dr. Delton Coombes during her office visit.  Magnesium today is 1.6.  Patient will receive 2 gram of magnesium IV per MD orders.  All other labs are within treatment parameters.  We will proceed with treatment per MD orders.   Patient tolerated treatment well with no complaints voiced.  Patient left ambulatory in stable condition.  Vital signs stable at discharge.  Follow up as scheduled.

## 2021-03-13 NOTE — Progress Notes (Signed)
Patients port flushed without difficulty.  Good blood return noted with no bruising or swelling noted at site.  Stable during access and blood draw.  Patient to remain accessed for treatment. 

## 2021-03-13 NOTE — Patient Instructions (Signed)
[facility]  Discharge Instructions: Thank you for choosing [facility] to provide your oncology and hematology care.  If you have a lab appointment with the Cancer Center, please come in thru the Main Entrance and check in at the main information desk.  Wear comfortable clothing and clothing appropriate for easy access to any Portacath or PICC line.   We strive to give you quality time with your provider. You may need to reschedule your appointment if you arrive late (15 or more minutes).  Arriving late affects you and other patients whose appointments are after yours.  Also, if you miss three or more appointments without notifying the office, you may be dismissed from the clinic at the provider's discretion.      For prescription refill requests, have your pharmacy contact our office and allow 72 hours for refills to be completed.        To help prevent nausea and vomiting after your treatment, we encourage you to take your nausea medication as directed.  BELOW ARE SYMPTOMS THAT SHOULD BE REPORTED IMMEDIATELY: *FEVER GREATER THAN 100.4 F (38 C) OR HIGHER *CHILLS OR SWEATING *NAUSEA AND VOMITING THAT IS NOT CONTROLLED WITH YOUR NAUSEA MEDICATION *UNUSUAL SHORTNESS OF BREATH *UNUSUAL BRUISING OR BLEEDING *URINARY PROBLEMS (pain or burning when urinating, or frequent urination) *BOWEL PROBLEMS (unusual diarrhea, constipation, pain near the anus) TENDERNESS IN MOUTH AND THROAT WITH OR WITHOUT PRESENCE OF ULCERS (sore throat, sores in mouth, or a toothache) UNUSUAL RASH, SWELLING OR PAIN  UNUSUAL VAGINAL DISCHARGE OR ITCHING   Items with * indicate a potential emergency and should be followed up as soon as possible or go to the Emergency Department if any problems should occur.  Please show the CHEMOTHERAPY ALERT CARD or IMMUNOTHERAPY ALERT CARD at check-in to the Emergency Department and triage nurse.  Should you have questions after your visit or need to cancel  or reschedule your appointment, please contact [facility] [phone]  and follow the prompts.  Office hours are 8:00 a.m. to 4:30 p.m. Monday - Friday. Please note that voicemails left after 4:00 p.m. may not be returned until the following business day.  We are closed weekends and major holidays. You have access to a nurse at all times for urgent questions. Please call the main number to the clinic [phone] and follow the prompts.  For any non-urgent questions, you may also contact your provider using MyChart. We now offer e-Visits for anyone 18 and older to request care online for non-urgent symptoms. For details visit [email or web address].   Also download the MyChart app! Go to the app store, search "MyChart", open the app, select [facility], and log in with your MyChart username and password.  Due to Covid, a mask is required upon entering the hospital/clinic. If you do not have a mask, one will be given to you upon arrival. For doctor visits, patients may have 1 support person aged 18 or older with them. For treatment visits, patients cannot have anyone with them due to current Covid guidelines and our immunocompromised population.  

## 2021-03-14 ENCOUNTER — Encounter (HOSPITAL_COMMUNITY): Payer: Self-pay | Admitting: Hematology

## 2021-03-15 ENCOUNTER — Other Ambulatory Visit: Payer: Self-pay

## 2021-03-15 ENCOUNTER — Inpatient Hospital Stay (HOSPITAL_COMMUNITY): Payer: 59 | Attending: Hematology

## 2021-03-15 ENCOUNTER — Encounter (HOSPITAL_COMMUNITY): Payer: Self-pay

## 2021-03-15 VITALS — BP 107/83 | HR 97 | Temp 96.5°F | Resp 18

## 2021-03-15 DIAGNOSIS — Z5189 Encounter for other specified aftercare: Secondary | ICD-10-CM | POA: Diagnosis not present

## 2021-03-15 DIAGNOSIS — D649 Anemia, unspecified: Secondary | ICD-10-CM | POA: Diagnosis not present

## 2021-03-15 DIAGNOSIS — G629 Polyneuropathy, unspecified: Secondary | ICD-10-CM | POA: Diagnosis not present

## 2021-03-15 DIAGNOSIS — Z5111 Encounter for antineoplastic chemotherapy: Secondary | ICD-10-CM | POA: Diagnosis present

## 2021-03-15 DIAGNOSIS — Z95828 Presence of other vascular implants and grafts: Secondary | ICD-10-CM

## 2021-03-15 DIAGNOSIS — E876 Hypokalemia: Secondary | ICD-10-CM | POA: Insufficient documentation

## 2021-03-15 DIAGNOSIS — C541 Malignant neoplasm of endometrium: Secondary | ICD-10-CM | POA: Diagnosis present

## 2021-03-15 LAB — URINE CULTURE

## 2021-03-15 MED ORDER — PEGFILGRASTIM-CBQV 6 MG/0.6ML ~~LOC~~ SOSY
6.0000 mg | PREFILLED_SYRINGE | Freq: Once | SUBCUTANEOUS | Status: AC
  Administered 2021-03-15: 6 mg via SUBCUTANEOUS
  Filled 2021-03-15: qty 0.6

## 2021-03-15 NOTE — Progress Notes (Signed)
Michele Romero presents today for injection per the provider's orders.  Udenyca administration without incident; injection site WNL; see MAR for injection details.  Patient tolerated procedure well and without incident.  No questions or complaints noted at this time.   Discharged from clinic by wheel chair in stable condition. Alert and oriented x 3. F/U with [facility] as scheduled.

## 2021-03-19 ENCOUNTER — Ambulatory Visit: Payer: 59 | Admitting: Radiation Oncology

## 2021-03-26 ENCOUNTER — Ambulatory Visit: Payer: 59 | Admitting: Radiation Oncology

## 2021-03-29 ENCOUNTER — Telehealth: Payer: Self-pay | Admitting: *Deleted

## 2021-03-29 NOTE — Telephone Encounter (Signed)
Called patient's sister- Gwinda Passe Pinkard to ask her to have her sister call me to arrange [place] [facility], lvm for a return call

## 2021-04-02 NOTE — Progress Notes (Signed)
[place], [place] [address]   CLINIC:  Medical Oncology/Hematology  PCP:  Michele Larsson, NP [address]  [address] / [place] [address] [phone]   REASON FOR VISIT:  Follow-up for endometrial cancer  PRIOR THERAPY: none  NGS Results: not done  CURRENT THERAPY: Carboplatin and Taxol every 3 weeks  BRIEF ONCOLOGIC HISTORY:  Oncology History  Endometrial cancer (Michele Romero)  11/15/2020 Initial Diagnosis   Endometrial cancer (Michele Romero)   01/23/2021 -  Chemotherapy   Patient is on Treatment Plan : UTERINE Carboplatin AUC 6 / Paclitaxel q21d       CANCER STAGING:  Cancer Staging  Endometrial cancer (Michele Romero) Staging form: Corpus Uteri - Carcinoma and Carcinosarcoma, AJCC 8th Edition - Clinical stage from 12/31/2020: FIGO Stage IIIC1 (cT1a, cN41m, cM0) - Unsigned   INTERVAL HISTORY:  [name] a 60y.o. female, returns for routine follow-up and consideration for next cycle of chemotherapy. Michele Romero last seen on 03/13/2021.  Due for cycle #4 of Carboplatin and Taxol today.   Overall, she tells me she has been feeling pretty well. She reports chronic intermittent right hip pain for which she takes ibuprofen as needed. She reports numbness in her left hand and wrist accompanied by a burning sensation, and she has difficulty straightening her fingers and holding objects; she denies history of arthritis in her hands. The pain is worst at night. She has never taken Gabapentin. She denies nausea, vomiting, diarrhea, and dysuria.   Overall, she feels ready for next cycle of chemo today.    REVIEW OF SYSTEMS:  Review of Systems  Constitutional:  Negative for appetite change (80%) and fatigue (50%).  Respiratory:  Positive for shortness of breath.   Gastrointestinal:  Negative for diarrhea, nausea and vomiting.  Musculoskeletal:  Positive for arthralgias (7/10 R hip) and back pain (7/10).  Neurological:  Positive for  numbness.  All other systems reviewed and are negative.  PAST MEDICAL/SURGICAL HISTORY:  Past Medical History:  Diagnosis Date   Ambulates with cane 10/31/2020   Arthritis    RIGHT HIP   Asthma    Cigarette nicotine dependence without complication 090/24/0973  Complex tear of medial meniscus of right knee 01/20/2017   COPD (chronic obstructive pulmonary disease) (Michele Romero    ENDOMETRIAL CANCER 10/31/2020   Endometrial cancer ([facility] 11/15/2020   GERD (gastroesophageal reflux disease)    Migraines    Port-A-Cath in place 01/01/2021   Past Surgical History:  Procedure Laterality Date   APPENDECTOMY     YRS AGO PER PT ON 10-31-2020   BIOPSY  11/29/2019   Procedure: BIOPSY;  Surgeon: Michele Quale MD;  [facility];  Service: Gastroenterology;;  ileocecal vlve   CHOLECYSTECTOMY     YRS AGO PER PT ON 10-31-2020   COLONOSCOPY WITH PROPOFOL N/A 11/29/2019   Procedure: COLONOSCOPY WITH PROPOFOL;  Surgeon: Michele Quale MD;  [facility];  Service: Gastroenterology;  Laterality: N/A;  930   HYSTEROSCOPY WITH D & C N/A 10/10/2020   Procedure: DILATATION AND CURETTAGE /HYSTEROSCOPY;  Surgeon: Michele Buff MD;  Location: [facility];  Service: Gynecology;  Laterality: N/A;   IR IMAGING GUIDED PORT INSERTION  01/08/2021   KNEE ARTHROPLASTY  2018   left knee   POLYPECTOMY  11/29/2019   Procedure: POLYPECTOMY;  Surgeon: Michele Quale MD;  [facility];  Service: Gastroenterology;;  sigmoid colon    ROBOTIC  ASSISTED TOTAL HYSTERECTOMY WITH BILATERAL SALPINGO OOPHERECTOMY N/A 11/15/2020   Procedure: XI ROBOTIC ASSISTED TOTAL HYSTERECTOMY GREATER THAN TWO HUNDRED AND FIFTY GRAMS WITH BILATERAL SALPINGO OOPHORECTOMY, MINI LAPAROTOMY;  Surgeon: Michele Amber, MD;  Location: [facility];  Service: Gynecology;  Laterality: N/A;   SENTINEL NODE BIOPSY N/A 11/15/2020   Procedure: SENTINEL NODE BIOPSY;  Surgeon: Michele Amber, MD;   Location: [facility];  Service: Gynecology;  Laterality: N/A;   TUBAL LIGATION  04/14/1985    SOCIAL HISTORY:  Social History   Socioeconomic History   Marital status: Single    Spouse name: Not on file   Number of children: 2   Years of education: Not on file   Highest education level: High school graduate  Occupational History   Not on file  Tobacco Use   Smoking status: Former    Packs/day: 0.50    Years: 30.00    Pack years: 15.00    Types: Cigarettes    Quit date: 01/01/2020    Years since quitting: 1.2    Passive exposure: Never   Smokeless tobacco: Never  Vaping Use   Vaping Use: Never used  Substance and Sexual Activity   Alcohol use: No   Drug use: Yes    Frequency: 6.0 times per week    Types: Marijuana    Comment: every now and then   Sexual activity: Not Currently    Birth control/protection: Surgical    Comment: tubal  Other Topics Concern   Not on file  Social History Narrative   Lives with daughter and 1 grandbaby   Son is in [place]      Enjoys: sitting outside, music, tv      Diet: eats all food groups   Caffeine: soda and tea: 2-3 cups daily   Water: 2-3 16 oz bottles      Wears seat belt    Does not use phone while driving   Oceanographer at home    No weapons    Social Determinants of Health   Financial Resource Strain: High Risk   Difficulty of Paying Living Expenses: Hard  Food Insecurity: No Food Insecurity   Worried About Charity fundraiser in the Last Year: Never true   Arboriculturist in the Last Year: Never true  Transportation Needs: Unmet Transportation Needs   Lack of Transportation (Medical): Yes   Lack of Transportation (Non-Medical): Yes  Physical Activity: Not on file  Stress: Stress Concern Present   Feeling of Stress : To some extent  Social Connections: Unknown   Frequency of Communication with Friends and Family: More than three times a week   Frequency of Social Gatherings with Friends and Family:  More than three times a week   Attends Religious Services: More than 4 times per year   Active Member of [employer] or Organizations: Not on file   Attends Archivist Meetings: Not on file   Marital Status: Not on file  Intimate Partner Violence: Not on file    FAMILY HISTORY:  Family History  Problem Relation Age of Onset   Diabetes Mother    Hypertension Mother    Heart disease Mother        CHF   Stroke Mother    Heart disease Father    Colon cancer Brother    Breast cancer Neg Hx    Ovarian cancer Neg Hx    Endometrial cancer Neg Hx    Prostate cancer  Neg Hx    Pancreatic cancer Neg Hx     CURRENT MEDICATIONS:  Current Outpatient Medications  Medication Sig Dispense Refill   budesonide-formoterol (SYMBICORT) 80-4.5 MCG/ACT inhaler Inhale 2 puffs into the lungs 2 (two) times daily. 10.2 g 2   CARBOPLATIN IV Inject into the vein every 21 ( twenty-one) days.     ciprofloxacin (CIPRO) 500 MG tablet Take 1 tablet (500 mg total) by mouth 2 (two) times daily. 10 tablet 0   lidocaine-prilocaine (EMLA) cream Apply small amount to port a cath site and cover with plastic wrap 1 hour prior to chemotherapy appointments 30 g 3   loratadine (CLARITIN) 10 MG tablet Take 1 tablet (10 mg total) by mouth daily. 30 tablet 2   magnesium oxide (MAG-OX) 400 (240 Mg) MG tablet Take 1 tablet (400 mg total) by mouth 2 (two) times daily. 60 tablet 3   Multiple Vitamin (MULTIVITAMIN WITH MINERALS) TABS tablet Take 1 tablet by mouth daily.     PACLITAXEL IV Inject into the vein every 21 ( twenty-one) days.     pantoprazole (PROTONIX) 40 MG tablet Take 1 tablet (40 mg total) by mouth daily. 30 tablet 1   Pegfilgrastim-cbqv (UDENYCA [place]) Inject into the skin every 21 ( twenty-one) days. Administer on Day 3 of each chemotherapy cycle     potassium chloride SA (KLOR-CON) 20 MEQ tablet Take 1 tablet (20 mEq total) by mouth daily. 30 tablet 3   senna-docusate (SENOKOT-S) 8.6-50 MG tablet Take 2 tablets  by mouth at bedtime. For AFTER surgery, do not take if having diarrhea 30 tablet 0   vitamin B-12 (CYANOCOBALAMIN) 500 MCG tablet Take 500 mcg by mouth daily.     albuterol (VENTOLIN HFA) 108 (90 Base) MCG/ACT inhaler Inhale 2 puffs into the lungs every 6 (six) hours as needed for wheezing or shortness of breath. (Patient not taking: Reported on 04/03/2021) 8 g 2   calcium carbonate (TUMS - DOSED IN MG ELEMENTAL CALCIUM) 500 MG chewable tablet Chew 1 tablet by mouth as needed for indigestion or heartburn. (Patient not taking: Reported on 04/03/2021)     ibuprofen (ADVIL) 800 MG tablet Take 1 tablet (800 mg total) by mouth every 8 (eight) hours as needed for moderate pain. For AFTER surgery only (Patient not taking: Reported on 04/03/2021) 30 tablet 0   ondansetron (ZOFRAN ODT) 8 MG disintegrating tablet Take 1 tablet (8 mg total) by mouth every 8 (eight) hours as needed for nausea or vomiting. (Patient not taking: Reported on 04/03/2021) 8 tablet 0   prochlorperazine (COMPAZINE) 10 MG tablet Take 1 tablet (10 mg total) by mouth every 6 (six) hours as needed for nausea or vomiting. (Patient not taking: Reported on 04/03/2021) 30 tablet 3   traMADol (ULTRAM) 50 MG tablet Take 1 tablet (50 mg total) by mouth every 6 (six) hours as needed for severe pain. For AFTER surgery only, do not take and drive (Patient not taking: Reported on 04/03/2021) 10 tablet 0   No current facility-administered medications for this visit.    ALLERGIES:  Allergies  Allergen Reactions   Codeine Nausea And Vomiting and Rash    PHYSICAL EXAM:  Performance status (ECOG): 1 - Symptomatic but completely ambulatory  There were no vitals filed for this visit. Wt Readings from Last 3 Encounters:  04/03/21 195 lb 6.4 oz (88.6 kg)  03/13/21 197 lb 12.8 oz (89.7 kg)  02/13/21 208 lb 3.2 oz (94.4 kg)   Physical Exam Vitals reviewed.  Constitutional:  Appearance: Normal appearance.  Cardiovascular:     Rate and Rhythm:  Normal rate and regular rhythm.     Pulses: Normal pulses.     Heart sounds: Normal heart sounds.  Pulmonary:     Effort: Pulmonary effort is normal.     Breath sounds: Normal breath sounds.  Neurological:     General: No focal deficit present.     Mental Status: She is alert and oriented to person, place, and time.  Psychiatric:        Mood and Affect: Mood normal.        Behavior: Behavior normal.    LABORATORY DATA:  I have reviewed the labs as listed.  CBC Latest Ref Rng & Units 04/03/2021 03/13/2021 02/13/2021  WBC 4.0 - 10.5 K/uL 5.0 7.0 8.1  Hemoglobin 12.0 - 15.0 g/dL 9.1(L) 9.8(L) 8.6(L)  Hematocrit 36.0 - 46.0 % 27.9(L) 31.8(L) 27.3(L)  Platelets 150 - 400 K/uL 149(L) 362 329   CMP Latest Ref Rng & Units 04/03/2021 03/13/2021 02/13/2021  Glucose 70 - 99 mg/dL 127(H) 135(H) 131(H)  BUN 8 - 23 mg/dL _0 Creatinine 0.44 - 1.00 mg/dL 0.52 0.72 0.64  Sodium 135 - 145 mmol/L 137 138 141  Potassium 3.5 - 5.1 mmol/L 3.3(L) 4.0 2.9(L)  Chloride 98 - 111 mmol/L 105 107 105  CO2 22 - 32 mmol/L _1 Calcium 8.9 - 10.3 mg/dL 8.5(L) 9.2 8.5(L)  Total Protein 6.5 - 8.1 g/dL 7.0 7.3 7.1  Total Bilirubin 0.3 - 1.2 mg/dL 0.3 0.3 0.2(L)  Alkaline Phos 38 - 126 U/L 72 65 67  AST 15 - 41 U/L 18 17 13(L)  ALT 0 - 44 U/L _2 DIAGNOSTIC IMAGING:  I have independently reviewed the scans and discussed with the patient. No results found.   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 - Endometrial biopsy on 10/10/2020 consistent with carcinosarcoma, MMR preserved. - CT scan of the abdomen and pelvis with contrast on 10/29/2020 with endometrial mass, no compelling findings of metastatic disease in the abdomen or pelvis.  30 cm fatty lesion intraluminally in the distal transverse colon probably lipoma. - Robotic assisted laparoscopic total hysterectomy with bilateral salpingo-oophorectomy and  sentinel lymph node biopsy by Dr. Denman George on 11/15/2020. - Pathology consistent with carcinosarcoma spanning 8.5 cm, tumor invades less than one half of myometrium, LVI positive, 1 lymph node involved with micrometastasis, PT1APN1 MI, MMR preserved, MSI-stable - She was evaluated by Dr. Denman George and 6 cycles of adjuvant chemotherapy with carboplatin and paclitaxel with vaginal brachytherapy was recommended. - Cycle 1 of carboplatin and paclitaxel on 01/23/2021.  2.  Social/family history: - Lives at home with her daughter and granddaughter.  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  She quit smoking in September 2021.  Half pack per day smoker for 30 years.  She has severe right hip arthritis, requiring her to use a cane for ambulation. - No family history of malignancies.   PLAN:  Stage III C1 (PT1APN1A) endometrial carcinosarcoma: - S she did not experience any major GI side effects.  No dysuria. - Reviewed labs today which showed normal white count and platelet count with normal differential.  LFTs are grossly normal. - We will proceed with cycle 4 today.  Paclitaxel will be dose reduced to 140 mg per metered square. - RTC 3 weeks for  labs and treatment.  2.  Normocytic anemia: - Hemoglobin 9.1 with MCV 92.  Anemia from myelosuppression.  3.  Hypomagnesemia: - Magnesium is 1.4.  She will have magnesium 3 g today.  Will increase magnesium to 3 times daily.  4.  Hypokalemia: - Continue potassium 20 mEq daily.  Potassium is 3.3.  5.  Peripheral neuropathy: - She has constant numbness in the fingertips and feet. - She reports left wrist numbness and burning pain. - She has finger contractures. - Recommend x-ray of both hands and left wrist. - We will start her on gabapentin 300 mg twice daily and titrate up.   Orders placed this encounter:  No orders of the defined types were placed in this encounter.    Derek Jack, MD [place] [phone]   I, Thana Ates, am acting as a scribe for Dr. Derek Jack.  I, Derek Jack MD, have reviewed the above documentation for accuracy and completeness, and I agree with the above.

## 2021-04-03 ENCOUNTER — Inpatient Hospital Stay (HOSPITAL_BASED_OUTPATIENT_CLINIC_OR_DEPARTMENT_OTHER): Payer: 59 | Admitting: Hematology

## 2021-04-03 ENCOUNTER — Inpatient Hospital Stay (HOSPITAL_COMMUNITY): Payer: 59

## 2021-04-03 ENCOUNTER — Other Ambulatory Visit: Payer: Self-pay

## 2021-04-03 ENCOUNTER — Ambulatory Visit (HOSPITAL_COMMUNITY)
Admission: RE | Admit: 2021-04-03 | Discharge: 2021-04-03 | Disposition: A | Discharge status: Home / Self Care | Payer: PRIVATE HEALTH INSURANCE | Source: Ambulatory Visit | Attending: Hematology | Admitting: Hematology

## 2021-04-03 VITALS — BP 117/87 | HR 94 | Temp 96.9°F | Resp 18 | Ht 62.0 in | Wt 195.4 lb

## 2021-04-03 DIAGNOSIS — M79641 Pain in right hand: Secondary | ICD-10-CM

## 2021-04-03 DIAGNOSIS — Z5111 Encounter for antineoplastic chemotherapy: Secondary | ICD-10-CM | POA: Diagnosis not present

## 2021-04-03 DIAGNOSIS — C541 Malignant neoplasm of endometrium: Secondary | ICD-10-CM

## 2021-04-03 DIAGNOSIS — Z95828 Presence of other vascular implants and grafts: Secondary | ICD-10-CM

## 2021-04-03 DIAGNOSIS — M79642 Pain in left hand: Secondary | ICD-10-CM | POA: Diagnosis present

## 2021-04-03 LAB — CBC WITH DIFFERENTIAL/PLATELET
Abs Immature Granulocytes: 0.01 10*3/uL (ref 0.00–0.07)
Basophils Absolute: 0 10*3/uL (ref 0.0–0.1)
Basophils Relative: 0 %
Eosinophils Absolute: 0 10*3/uL (ref 0.0–0.5)
Eosinophils Relative: 1 %
HCT: 27.9 % — ABNORMAL LOW (ref 36.0–46.0)
Hemoglobin: 9.1 g/dL — ABNORMAL LOW (ref 12.0–15.0)
Immature Granulocytes: 0 %
Lymphocytes Relative: 32 %
Lymphs Abs: 1.6 10*3/uL (ref 0.7–4.0)
MCH: 30 pg (ref 26.0–34.0)
MCHC: 32.6 g/dL (ref 30.0–36.0)
MCV: 92.1 fL (ref 80.0–100.0)
Monocytes Absolute: 0.7 10*3/uL (ref 0.1–1.0)
Monocytes Relative: 13 %
Neutro Abs: 2.7 10*3/uL (ref 1.7–7.7)
Neutrophils Relative %: 54 %
Platelets: 149 10*3/uL — ABNORMAL LOW (ref 150–400)
RBC: 3.03 MIL/uL — ABNORMAL LOW (ref 3.87–5.11)
RDW: 21.5 % — ABNORMAL HIGH (ref 11.5–15.5)
WBC: 5 10*3/uL (ref 4.0–10.5)
nRBC: 0 % (ref 0.0–0.2)

## 2021-04-03 LAB — COMPREHENSIVE METABOLIC PANEL
ALT: 15 U/L (ref 0–44)
AST: 18 U/L (ref 15–41)
Albumin: 3.4 g/dL — ABNORMAL LOW (ref 3.5–5.0)
Alkaline Phosphatase: 72 U/L (ref 38–126)
Anion gap: 9 (ref 5–15)
BUN: 12 mg/dL (ref 8–23)
CO2: 23 mmol/L (ref 22–32)
Calcium: 8.5 mg/dL — ABNORMAL LOW (ref 8.9–10.3)
Chloride: 105 mmol/L (ref 98–111)
Creatinine, Ser: 0.52 mg/dL (ref 0.44–1.00)
GFR, Estimated: >60 mL/min (ref >60)
Glucose, Bld: 127 mg/dL — ABNORMAL HIGH (ref 70–99)
Potassium: 3.3 mmol/L — ABNORMAL LOW (ref 3.5–5.1)
Sodium: 137 mmol/L (ref 135–145)
Total Bilirubin: 0.3 mg/dL (ref 0.3–1.2)
Total Protein: 7 g/dL (ref 6.5–8.1)

## 2021-04-03 LAB — MAGNESIUM: Magnesium: 1.4 mg/dL — ABNORMAL LOW (ref 1.7–2.4)

## 2021-04-03 MED ORDER — FAMOTIDINE 20 MG IN NS 100 ML IVPB: 20.0000 mg | Freq: Once | INTRAVENOUS | Status: DC

## 2021-04-03 MED ORDER — MAGNESIUM OXIDE -MG SUPPLEMENT 400 (240 MG) MG PO TABS: 400.0000 mg | ORAL_TABLET | Freq: Three times a day (TID) | ORAL | 3 refills | Status: DC

## 2021-04-03 MED ORDER — SODIUM CHLORIDE 0.9% FLUSH
10.0000 mL | INTRAVENOUS | Status: DC | PRN
  Administered 2021-04-03: 14:00:00 10 mL

## 2021-04-03 MED ORDER — SODIUM CHLORIDE 0.9 % IV SOLN
10.0000 mg | Freq: Once | INTRAVENOUS | Status: AC
  Administered 2021-04-03: 10:00:00 10 mg via INTRAVENOUS
  Filled 2021-04-03: qty 10

## 2021-04-03 MED ORDER — SODIUM CHLORIDE 0.9 % IV SOLN: Freq: Once | INTRAVENOUS | Status: AC

## 2021-04-03 MED ORDER — SODIUM CHLORIDE 0.9 % IV SOLN
813.0000 mg | Freq: Once | INTRAVENOUS | Status: AC
  Administered 2021-04-03: 14:00:00 810 mg via INTRAVENOUS
  Filled 2021-04-03: qty 81

## 2021-04-03 MED ORDER — SODIUM CHLORIDE 0.9 % IV SOLN
150.0000 mg | Freq: Once | INTRAVENOUS | Status: AC
  Administered 2021-04-03: 10:00:00 150 mg via INTRAVENOUS
  Filled 2021-04-03: qty 150

## 2021-04-03 MED ORDER — GABAPENTIN 300 MG PO CAPS: 300.0000 mg | ORAL_CAPSULE | Freq: Two times a day (BID) | ORAL | 1 refills | Status: DC

## 2021-04-03 MED ORDER — SODIUM CHLORIDE 0.9 % IV SOLN
140.0000 mg/m2 | Freq: Once | INTRAVENOUS | Status: AC
  Administered 2021-04-03: 11:00:00 288 mg via INTRAVENOUS
  Filled 2021-04-03: qty 48

## 2021-04-03 MED ORDER — MAGNESIUM SULFATE 2 GM/50ML IV SOLN
2.0000 g | Freq: Once | INTRAVENOUS | Status: AC
  Administered 2021-04-03: 11:00:00 2 g via INTRAVENOUS
  Filled 2021-04-03: qty 50

## 2021-04-03 MED ORDER — DIPHENHYDRAMINE HCL 50 MG/ML IJ SOLN
50.0000 mg | Freq: Once | INTRAMUSCULAR | Status: AC
  Administered 2021-04-03: 09:00:00 50 mg via INTRAVENOUS
  Filled 2021-04-03: qty 1

## 2021-04-03 MED ORDER — HEPARIN SOD (PORK) LOCK FLUSH 100 UNIT/ML IV SOLN
500.0000 [IU] | Freq: Once | INTRAVENOUS | Status: AC | PRN
  Administered 2021-04-03: 14:00:00 500 [IU]

## 2021-04-03 MED ORDER — PALONOSETRON HCL INJECTION 0.25 MG/5ML
0.2500 mg | Freq: Once | INTRAVENOUS | Status: AC
  Administered 2021-04-03: 09:00:00 0.25 mg via INTRAVENOUS
  Filled 2021-04-03: qty 5

## 2021-04-03 MED ORDER — POTASSIUM CHLORIDE CRYS ER 20 MEQ PO TBCR
40.0000 meq | EXTENDED_RELEASE_TABLET | Freq: Once | ORAL | Status: AC
  Administered 2021-04-03: 10:00:00 40 meq via ORAL
  Filled 2021-04-03: qty 2

## 2021-04-03 MED ORDER — FAMOTIDINE IN NACL 20-0.9 MG/50ML-% IV SOLN
20.0000 mg | Freq: Once | INTRAVENOUS | Status: AC
  Administered 2021-04-03: 10:00:00 20 mg via INTRAVENOUS
  Filled 2021-04-03: qty 50

## 2021-04-03 MED ORDER — POTASSIUM CHLORIDE CRYS ER 20 MEQ PO TBCR: 20.0000 meq | EXTENDED_RELEASE_TABLET | Freq: Every day | ORAL | 3 refills | Status: DC

## 2021-04-03 NOTE — Patient Instructions (Signed)
[place] at [facility] Discharge Instructions   You were seen and examined today by Dr. Delton Coombes. He reviewed your lab work - your magnesium and potassium remain low - refills were sent to your pharmacy.  Increase magnesium to three times a day. We also sent a prescription for gabapentin - this is to help with the pain in your hands. We will proceed with treatment today. We will obtain xrays of your hands to rule out arthritis.  Return as scheduled in 3 weeks for lab work and treatment.    Thank you for choosing [place] at [facility] to provide your oncology and hematology care.  To afford each patient quality time with our provider, please arrive at least 15 minutes before your scheduled appointment time.   If you have a lab appointment with the [facility] please come in thru the Main Entrance and check in at the main information desk.  You need to re-schedule your appointment should you arrive 10 or more minutes late.  We strive to give you quality time with our providers, and arriving late affects you and other patients whose appointments are after yours.  Also, if you no show three or more times for appointments you may be dismissed from the clinic at the providers discretion.     Again, thank you for choosing [facility].  Our hope is that these requests will decrease the amount of time that you wait before being seen by our physicians.       _____________________________________________________________  Should you have questions after your visit to [facility] please contact our office at [phone] and follow the prompts.  Our office hours are 8:00 a.m. and 4:30 p.m. Monday - Friday.  Please note that voicemails left after 4:00 p.m. may not be returned until the following business day.  We are closed weekends and major holidays.  You do have access to a nurse 24-7, just call the main number to the  clinic [phone] and do not press any options, hold on the line and a nurse will answer the phone.    For prescription refill requests, have your pharmacy contact our office and allow 72 hours.    Due to Covid, you will need to wear a mask upon entering the hospital. If you do not have a mask, a mask will be given to you at the Main Entrance upon arrival. For doctor visits, patients may have 1 support person age 106 or older with them. For treatment visits, patients can not have anyone with them due to social distancing guidelines and our immunocompromised population.

## 2021-04-03 NOTE — Progress Notes (Signed)
Patient presents today for Taxol and Carboplatin infusion per providers order.  Vital signs and labs within parameters for treatment.  Magnesium noted to be 1.4 and Potassium 3.3, per protocol patient will receive 40 mEq Potassium PO and 4 grams Magnesium Sulfate IV.  Message received from Anastasio Champion RN/Dr. Delton Coombes patient okay for treatment.  Taxol/Carboplatin given today per MD orders.  Stable during infusion without adverse affects.  Vital signs stable.  No complaints at this time.  Discharge from clinic via wheelchair in stable condition.  Alert and oriented X 3.  Follow up with [facility] as scheduled.

## 2021-04-03 NOTE — Progress Notes (Signed)
Patient has been examined by Dr. Katragadda, and vital signs and labs have been reviewed. ANC, Creatinine, LFTs, hemoglobin, and platelets are within treatment parameters per M.D. - pt may proceed with treatment.    °

## 2021-04-05 ENCOUNTER — Inpatient Hospital Stay (HOSPITAL_COMMUNITY): Payer: 59

## 2021-04-12 ENCOUNTER — Telehealth: Payer: Self-pay | Admitting: *Deleted

## 2021-04-12 NOTE — Telephone Encounter (Signed)
CALLED PATIENT TO ASK ABOUT COMING FOR [facility] [place] ON 04-22-21, PATIENT STATED THAT SHE HAS ANOTHER APPT. AND WILL NOT BE ABLE TO MAKE THAT DATE, I TOLD HER THAT I WOULD SPEAK WITH DR. KINARD AND CALL HER BACK ON 04-16-21 AFTER THAT I SPEAK WITH DR. Sondra Come

## 2021-04-22 ENCOUNTER — Ambulatory Visit: Payer: 59 | Admitting: Radiation Oncology

## 2021-04-24 ENCOUNTER — Inpatient Hospital Stay (HOSPITAL_BASED_OUTPATIENT_CLINIC_OR_DEPARTMENT_OTHER): Payer: 59 | Admitting: Hematology

## 2021-04-24 ENCOUNTER — Inpatient Hospital Stay (HOSPITAL_COMMUNITY): Payer: 59

## 2021-04-24 ENCOUNTER — Inpatient Hospital Stay (HOSPITAL_COMMUNITY): Payer: 59 | Attending: Hematology

## 2021-04-24 ENCOUNTER — Other Ambulatory Visit: Payer: Self-pay

## 2021-04-24 VITALS — BP 113/77 | HR 114 | Temp 96.7°F | Resp 18 | Ht 59.45 in | Wt 195.0 lb

## 2021-04-24 DIAGNOSIS — C541 Malignant neoplasm of endometrium: Secondary | ICD-10-CM

## 2021-04-24 DIAGNOSIS — M79642 Pain in left hand: Secondary | ICD-10-CM | POA: Diagnosis not present

## 2021-04-24 DIAGNOSIS — M79641 Pain in right hand: Secondary | ICD-10-CM

## 2021-04-24 DIAGNOSIS — Z5189 Encounter for other specified aftercare: Secondary | ICD-10-CM | POA: Insufficient documentation

## 2021-04-24 DIAGNOSIS — G63 Polyneuropathy in diseases classified elsewhere: Secondary | ICD-10-CM

## 2021-04-24 DIAGNOSIS — C801 Malignant (primary) neoplasm, unspecified: Secondary | ICD-10-CM

## 2021-04-24 DIAGNOSIS — Z5111 Encounter for antineoplastic chemotherapy: Secondary | ICD-10-CM | POA: Insufficient documentation

## 2021-04-24 LAB — COMPREHENSIVE METABOLIC PANEL
ALT: 14 U/L (ref 0–44)
AST: 17 U/L (ref 15–41)
Albumin: 3.4 g/dL — ABNORMAL LOW (ref 3.5–5.0)
Alkaline Phosphatase: 68 U/L (ref 38–126)
Anion gap: 10 (ref 5–15)
BUN: 12 mg/dL (ref 8–23)
CO2: 24 mmol/L (ref 22–32)
Calcium: 8.7 mg/dL — ABNORMAL LOW (ref 8.9–10.3)
Chloride: 106 mmol/L (ref 98–111)
Creatinine, Ser: 0.52 mg/dL (ref 0.44–1.00)
GFR, Estimated: >60 mL/min (ref >60)
Glucose, Bld: 121 mg/dL — ABNORMAL HIGH (ref 70–99)
Potassium: 3.5 mmol/L (ref 3.5–5.1)
Sodium: 140 mmol/L (ref 135–145)
Total Bilirubin: 0.6 mg/dL (ref 0.3–1.2)
Total Protein: 6.7 g/dL (ref 6.5–8.1)

## 2021-04-24 LAB — CBC WITH DIFFERENTIAL/PLATELET
Abs Immature Granulocytes: 0.01 10*3/uL (ref 0.00–0.07)
Basophils Absolute: 0 10*3/uL (ref 0.0–0.1)
Basophils Relative: 0 %
Eosinophils Absolute: 0 10*3/uL (ref 0.0–0.5)
Eosinophils Relative: 1 %
HCT: 28.6 % — ABNORMAL LOW (ref 36.0–46.0)
Hemoglobin: 9.1 g/dL — ABNORMAL LOW (ref 12.0–15.0)
Immature Granulocytes: 0 %
Lymphocytes Relative: 45 %
Lymphs Abs: 1.6 10*3/uL (ref 0.7–4.0)
MCH: 30.4 pg (ref 26.0–34.0)
MCHC: 31.8 g/dL (ref 30.0–36.0)
MCV: 95.7 fL (ref 80.0–100.0)
Monocytes Absolute: 0.3 10*3/uL (ref 0.1–1.0)
Monocytes Relative: 9 %
Neutro Abs: 1.6 10*3/uL — ABNORMAL LOW (ref 1.7–7.7)
Neutrophils Relative %: 45 %
Platelets: 82 10*3/uL — ABNORMAL LOW (ref 150–400)
RBC: 2.99 MIL/uL — ABNORMAL LOW (ref 3.87–5.11)
RDW: 20.5 % — ABNORMAL HIGH (ref 11.5–15.5)
WBC Morphology: REACTIVE
WBC: 3.6 10*3/uL — ABNORMAL LOW (ref 4.0–10.5)
nRBC: 0 % (ref 0.0–0.2)

## 2021-04-24 LAB — MAGNESIUM: Magnesium: 1.5 mg/dL — ABNORMAL LOW (ref 1.7–2.4)

## 2021-04-24 MED ORDER — HEPARIN SOD (PORK) LOCK FLUSH 100 UNIT/ML IV SOLN
500.0000 [IU] | Freq: Once | INTRAVENOUS | Status: AC
  Administered 2021-04-24: 500 [IU] via INTRAVENOUS

## 2021-04-24 MED ORDER — MAGNESIUM SULFATE 2 GM/50ML IV SOLN
2.0000 g | Freq: Once | INTRAVENOUS | Status: AC
  Administered 2021-04-24: 2 g via INTRAVENOUS
  Filled 2021-04-24: qty 50

## 2021-04-24 MED ORDER — SODIUM CHLORIDE 0.9 % IV SOLN: Freq: Once | INTRAVENOUS | Status: AC

## 2021-04-24 MED ORDER — SODIUM CHLORIDE 0.9% FLUSH
10.0000 mL | INTRAVENOUS | Status: DC | PRN
  Administered 2021-04-24: 10 mL via INTRAVENOUS

## 2021-04-24 NOTE — Patient Instructions (Signed)
[facility]  Discharge Instructions: Thank you for choosing [facility] to provide your oncology and hematology care.  If you have a lab appointment with the Cancer Center, please come in thru the Main Entrance and check in at the main information desk.  Wear comfortable clothing and clothing appropriate for easy access to any Portacath or PICC line.   We strive to give you quality time with your provider. You may need to reschedule your appointment if you arrive late (15 or more minutes).  Arriving late affects you and other patients whose appointments are after yours.  Also, if you miss three or more appointments without notifying the office, you may be dismissed from the clinic at the provider's discretion.      For prescription refill requests, have your pharmacy contact our office and allow 72 hours for refills to be completed.        To help prevent nausea and vomiting after your treatment, we encourage you to take your nausea medication as directed.  BELOW ARE SYMPTOMS THAT SHOULD BE REPORTED IMMEDIATELY: *FEVER GREATER THAN 100.4 F (38 C) OR HIGHER *CHILLS OR SWEATING *NAUSEA AND VOMITING THAT IS NOT CONTROLLED WITH YOUR NAUSEA MEDICATION *UNUSUAL SHORTNESS OF BREATH *UNUSUAL BRUISING OR BLEEDING *URINARY PROBLEMS (pain or burning when urinating, or frequent urination) *BOWEL PROBLEMS (unusual diarrhea, constipation, pain near the anus) TENDERNESS IN MOUTH AND THROAT WITH OR WITHOUT PRESENCE OF ULCERS (sore throat, sores in mouth, or a toothache) UNUSUAL RASH, SWELLING OR PAIN  UNUSUAL VAGINAL DISCHARGE OR ITCHING   Items with * indicate a potential emergency and should be followed up as soon as possible or go to the Emergency Department if any problems should occur.  Please show the CHEMOTHERAPY ALERT CARD or IMMUNOTHERAPY ALERT CARD at check-in to the Emergency Department and triage nurse.  Should you have questions after your visit or need to cancel  or reschedule your appointment, please contact [facility] [phone]  and follow the prompts.  Office hours are 8:00 a.m. to 4:30 p.m. Monday - Friday. Please note that voicemails left after 4:00 p.m. may not be returned until the following business day.  We are closed weekends and major holidays. You have access to a nurse at all times for urgent questions. Please call the main number to the clinic [phone] and follow the prompts.  For any non-urgent questions, you may also contact your provider using MyChart. We now offer e-Visits for anyone 18 and older to request care online for non-urgent symptoms. For details visit [email or web address].   Also download the MyChart app! Go to the app store, search "MyChart", open the app, select [facility], and log in with your MyChart username and password.  Due to Covid, a mask is required upon entering the hospital/clinic. If you do not have a mask, one will be given to you upon arrival. For doctor visits, patients may have 1 support person aged 18 or older with them. For treatment visits, patients cannot have anyone with them due to current Covid guidelines and our immunocompromised population.  

## 2021-04-24 NOTE — Progress Notes (Signed)
Patient presents today for chemotherapy infusion.  Patient is c/o severe neuropathy in hands and feet.  Vital signs are stable.  Labs reviewed by Dr. Delton Coombes during her office visit.    Discontinue Taxol per Dr. Delton Coombes due to neuropathy.  We will continue with Carbo today.    Platelets today are 82.  We will also hold Carbo today per Dr. Delton Coombes.  We will have patient RTC in one week for repeat labs and proceed with Carbo then if platelets are >100.  Magnesium today is 1.5.  We will give Magnesium 2 grams today per Dr. Delton Coombes.  Patient tolerated Magnesium well with no complaints voiced.  Patient left via wheelchair in stable condition.  Vital signs stable at discharge.  Follow up as scheduled.

## 2021-04-24 NOTE — Progress Notes (Signed)
Michele Romero, [place] [id number]   CLINIC:  Medical Oncology/Hematology  PCP:  Michele Larsson, NP None None   REASON FOR VISIT:  Follow-up for endometrial cancer  PRIOR THERAPY: none  NGS Results: not done  CURRENT THERAPY: Carboplatin and Taxol every 3 weeks  BRIEF ONCOLOGIC HISTORY:  Oncology History  Endometrial cancer ([place])  11/15/2020 Initial Diagnosis   Endometrial cancer ([place])   01/23/2021 -  Chemotherapy   Patient is on Treatment Plan : UTERINE Carboplatin AUC 6 / Paclitaxel q21d       CANCER STAGING:  Cancer Staging  Endometrial cancer ([place]) Staging form: Corpus Uteri - Carcinoma and Carcinosarcoma, AJCC 8th Edition - Clinical stage from 12/31/2020: FIGO Stage IIIC1 (cT1a, cN31m, cM0) - Unsigned   INTERVAL HISTORY:  [name] a 61y.o. female, returns for routine follow-up and consideration for next cycle of chemotherapy. [name] last seen on 04/03/2021.  Due for cycle #5 of Carboplatin and Taxol today.   Overall, she tells me she has been feeling fair. She reports severe numbness in her hands and feet, and she is unable to hold any objects and her fingers are drawn due to this numbness. This has not been helped with Gabapentin. The numbness in her feet makes it difficult to walk. She reports pin-and-needles sensation constantly in her hands bilaterally. She reports an episode of diarrhea for 5 days at the end of December which has since resolved. She is unable to make a fist with either hand, although her movement is slightly better in the right hand. She reports her appetite is 25% of her baseline  Overall, she feels ready for next cycle of chemo today.   REVIEW OF SYSTEMS:  Review of Systems  Constitutional:  Positive for appetite change and fatigue.  Cardiovascular:  Positive for chest pain.  Gastrointestinal:  Negative for diarrhea (resolved).  Musculoskeletal:  Positive for gait problem (d/t  numbness).  Neurological:  Positive for gait problem (d/t numbness) and numbness (hands and feet).  All other systems reviewed and are negative.  PAST MEDICAL/SURGICAL HISTORY:  Past Medical History:  Diagnosis Date   Ambulates with cane 10/31/2020   Arthritis    RIGHT HIP   Asthma    Cigarette nicotine dependence without complication 054/49/2010  Complex tear of medial meniscus of right knee 01/20/2017   COPD (chronic obstructive pulmonary disease) ([place]    ENDOMETRIAL CANCER 10/31/2020   [place]ometrial cancer ([place] 11/15/2020   GERD (gastroesophageal reflux disease)    Migraines    Port-A-Cath in place 01/01/2021   Past Surgical History:  Procedure Laterality Date   APPENDECTOMY     YRS AGO PER PT ON 10-31-2020   BIOPSY  11/29/2019   Procedure: BIOPSY;  Surgeon: CHarvel Quale MD;  [facility];  Service: Gastroenterology;;  ileocecal vlve   CHOLECYSTECTOMY     YRS AGO PER PT ON 10-31-2020   COLONOSCOPY WITH PROPOFOL N/A 11/29/2019   Procedure: COLONOSCOPY WITH PROPOFOL;  Surgeon: CHarvel Quale MD;  [facility];  Service: Gastroenterology;  Laterality: N/A;  930   HYSTEROSCOPY WITH D & C N/A 10/10/2020   Procedure: DILATATION AND CURETTAGE /HYSTEROSCOPY;  Surgeon: EFlorian Buff MD;  Location: [facility];  Service: Gynecology;  Laterality: N/A;   IR IMAGING GUIDED PORT INSERTION  01/08/2021   KNEE ARTHROPLASTY  2018   left knee   POLYPECTOMY  11/29/2019   Procedure: POLYPECTOMY;  Surgeon:  Harvel Quale, MD;  [facility];  Service: Gastroenterology;;  sigmoid colon    ROBOTIC ASSISTED TOTAL HYSTERECTOMY WITH BILATERAL SALPINGO OOPHERECTOMY N/A 11/15/2020   Procedure: XI ROBOTIC ASSISTED TOTAL HYSTERECTOMY GREATER THAN TWO HUNDRED AND FIFTY GRAMS WITH BILATERAL SALPINGO OOPHORECTOMY, MINI LAPAROTOMY;  Surgeon: Everitt Amber, MD;  Location: [facility];  Service: Gynecology;  Laterality: N/A;   SENTINEL NODE  BIOPSY N/A 11/15/2020   Procedure: SENTINEL NODE BIOPSY;  Surgeon: Everitt Amber, MD;  Location: [facility];  Service: Gynecology;  Laterality: N/A;   TUBAL LIGATION  04/14/1985    SOCIAL HISTORY:  Social History   Socioeconomic History   Marital status: Single    Spouse name: Not on file   Number of children: 2   Years of education: Not on file   Highest education level: High school graduate  Occupational History   Not on file  Tobacco Use   Smoking status: Former    Packs/day: 0.50    Years: 30.00    Pack years: 15.00    Types: Cigarettes    Quit date: 01/01/2020    Years since quitting: 1.3    Passive exposure: Never   Smokeless tobacco: Never  Vaping Use   Vaping Use: Never used  Substance and Sexual Activity   Alcohol use: No   Drug use: Yes    Frequency: 6.0 times per week    Types: Marijuana    Comment: every now and then   Sexual activity: Not Currently    Birth control/protection: Surgical    Comment: tubal  Other Topics Concern   Not on file  Social History Narrative   Lives with daughter and 1 grandbaby   Son is in [place]      Enjoys: sitting outside, music, tv      Diet: eats all food groups   Caffeine: soda and tea: 2-3 cups daily   Water: 2-3 16 oz bottles      Wears seat belt    Does not use phone while driving   Oceanographer at home    No weapons    Social Determinants of Health   Financial Resource Strain: High Risk   Difficulty of Paying Living Expenses: Hard  Food Insecurity: No Food Insecurity   Worried About Charity fundraiser in the Last Year: Never true   Arboriculturist in the Last Year: Never true  Transportation Needs: Unmet Transportation Needs   Lack of Transportation (Medical): Yes   Lack of Transportation (Non-Medical): Yes  Physical Activity: Not on file  Stress: Stress Concern Present   Feeling of Stress : To some extent  Social Connections: Unknown   Frequency of Communication with Friends and Family:  More than three times a week   Frequency of Social Gatherings with Friends and Family: More than three times a week   Attends Religious Services: More than 4 times per year   Active Member of [employer] or Organizations: Not on file   Attends Archivist Meetings: Not on file   Marital Status: Not on file  Intimate Partner Violence: Not on file    FAMILY HISTORY:  Family History  Problem Relation Age of Onset   Diabetes Mother    Hypertension Mother    Heart disease Mother        CHF   Stroke Mother    Heart disease Father    Colon cancer Brother    Breast cancer Neg Hx  Ovarian cancer Neg Hx    Endometrial cancer Neg Hx    Prostate cancer Neg Hx    Pancreatic cancer Neg Hx     CURRENT MEDICATIONS:  Current Outpatient Medications  Medication Sig Dispense Refill   calcium carbonate (TUMS - DOSED IN MG ELEMENTAL CALCIUM) 500 MG chewable tablet Chew 1 tablet by mouth as needed for indigestion or heartburn.     CARBOPLATIN IV Inject into the vein every 21 ( twenty-one) days.     ciprofloxacin (CIPRO) 500 MG tablet Take 1 tablet (500 mg total) by mouth 2 (two) times daily. 10 tablet 0   gabapentin (NEURONTIN) 300 MG capsule Take 1 capsule (300 mg total) by mouth 2 (two) times daily. 60 capsule 1   ibuprofen (ADVIL) 800 MG tablet Take 1 tablet (800 mg total) by mouth every 8 (eight) hours as needed for moderate pain. For AFTER surgery only 30 tablet 0   lidocaine-prilocaine (EMLA) cream Apply small amount to port a cath site and cover with plastic wrap 1 hour prior to chemotherapy appointments 30 g 3   loratadine (CLARITIN) 10 MG tablet Take 1 tablet (10 mg total) by mouth daily. 30 tablet 2   magnesium oxide (MAG-OX) 400 (240 Mg) MG tablet Take 1 tablet (400 mg total) by mouth in the morning, at noon, and at bedtime. 90 tablet 3   Multiple Vitamin (MULTIVITAMIN WITH MINERALS) TABS tablet Take 1 tablet by mouth daily.     ondansetron (ZOFRAN ODT) 8 MG disintegrating tablet Take  1 tablet (8 mg total) by mouth every 8 (eight) hours as needed for nausea or vomiting. 8 tablet 0   PACLITAXEL IV Inject into the vein every 21 ( twenty-one) days.     pantoprazole (PROTONIX) 40 MG tablet Take 1 tablet (40 mg total) by mouth daily. 30 tablet 1   Pegfilgrastim-cbqv (UDENYCA [place]) Inject into the skin every 21 ( twenty-one) days. Administer on Day 3 of each chemotherapy cycle     potassium chloride SA (KLOR-CON M) 20 MEQ tablet Take 1 tablet (20 mEq total) by mouth daily. 30 tablet 3   senna-docusate (SENOKOT-S) 8.6-50 MG tablet Take 2 tablets by mouth at bedtime. For AFTER surgery, do not take if having diarrhea 30 tablet 0   traMADol (ULTRAM) 50 MG tablet Take 1 tablet (50 mg total) by mouth every 6 (six) hours as needed for severe pain. For AFTER surgery only, do not take and drive 10 tablet 0   vitamin B-12 (CYANOCOBALAMIN) 500 MCG tablet Take 500 mcg by mouth daily.     albuterol (VENTOLIN HFA) 108 (90 Base) MCG/ACT inhaler Inhale 2 puffs into the lungs every 6 (six) hours as needed for wheezing or shortness of breath. (Patient not taking: Reported on 04/24/2021) 8 g 2   budesonide-formoterol (SYMBICORT) 80-4.5 MCG/ACT inhaler Inhale 2 puffs into the lungs 2 (two) times daily. (Patient not taking: Reported on 04/24/2021) 10.2 g 2   prochlorperazine (COMPAZINE) 10 MG tablet Take 1 tablet (10 mg total) by mouth every 6 (six) hours as needed for nausea or vomiting. (Patient not taking: Reported on 04/24/2021) 30 tablet 3   No current facility-administered medications for this visit.    ALLERGIES:  Allergies  Allergen Reactions   Codeine Nausea And Vomiting and Rash    PHYSICAL EXAM:  Performance status (ECOG): 1 - Symptomatic but completely ambulatory  Vitals:   04/24/21 0817  BP: 113/77  Pulse: (!) 114  Resp: 18  Temp: (!) 96.7 F (35.9 C)  SpO2: 99%   Wt Readings from Last 3 Encounters:  04/24/21 195 lb (88.5 kg)  04/03/21 195 lb 6.4 oz (88.6 kg)  03/13/21 197 lb  12.8 oz (89.7 kg)   Physical Exam Vitals reviewed.  Constitutional:      Appearance: Normal appearance.     Comments: In wheelchair  Cardiovascular:     Rate and Rhythm: Normal rate and regular rhythm.     Pulses: Normal pulses.     Heart sounds: Normal heart sounds.  Pulmonary:     Effort: Pulmonary effort is normal.     Breath sounds: Normal breath sounds.  Musculoskeletal:     Right lower leg: No edema.     Left lower leg: No edema.  Neurological:     General: No focal deficit present.     Mental Status: She is alert and oriented to person, place, and time.  Psychiatric:        Mood and Affect: Mood normal.        Behavior: Behavior normal.    LABORATORY DATA:  I have reviewed the labs as listed.  CBC Latest Ref Rng & Units 04/03/2021 03/13/2021 02/13/2021  WBC 4.0 - 10.5 K/uL 5.0 7.0 8.1  Hemoglobin 12.0 - 15.0 g/dL 9.1(L) 9.8(L) 8.6(L)  Hematocrit 36.0 - 46.0 % 27.9(L) 31.8(L) 27.3(L)  Platelets 150 - 400 K/uL 149(L) 362 329   CMP Latest Ref Rng & Units 04/03/2021 03/13/2021 02/13/2021  Glucose 70 - 99 mg/dL 127(H) 135(H) 131(H)  BUN 8 - 23 mg/dL 12 13 10   Creatinine 0.44 - 1.00 mg/dL 0.52 0.72 0.64  Sodium 135 - 145 mmol/L 137 138 141  Potassium 3.5 - 5.1 mmol/L 3.3(L) 4.0 2.9(L)  Chloride 98 - 111 mmol/L 105 107 105  CO2 22 - 32 mmol/L 23 23 28   Calcium 8.9 - 10.3 mg/dL 8.5(L) 9.2 8.5(L)  Total Protein 6.5 - 8.1 g/dL 7.0 7.3 7.1  Total Bilirubin 0.3 - 1.2 mg/dL 0.3 0.3 0.2(L)  Alkaline Phos 38 - 126 U/L 72 65 67  AST 15 - 41 U/L 18 17 13(L)  ALT 0 - 44 U/L 15 12 10     DIAGNOSTIC IMAGING:  I have independently reviewed the scans and discussed with the patient. DG Hand Complete Left  Result Date: 04/04/2021 CLINICAL DATA:  Left hand pain.  No injury. EXAM: LEFT HAND - COMPLETE 3+ VIEW COMPARISON:  None. FINDINGS: Bone alignment and mineralization is normal. Minimal degenerative changes over the radial side of the carpal bones and first carpometacarpal joint.  No fracture or dislocation. Remaining soft tissues are unremarkable. IMPRESSION: No acute findings. Electronically Signed   By: Marin Olp M.D.   On: 04/04/2021 12:45   DG Hand Complete Right  Result Date: 04/04/2021 CLINICAL DATA:  Right hand pain.  No injury. EXAM: RIGHT HAND - COMPLETE 3+ VIEW COMPARISON:  None. FINDINGS: Mild degenerative change over the scaphoid trapezium joint and first carpometacarpal joint. Minimal degenerate change over the first MTP joint. Bone mineralization and alignment is within normal. No bony erosions. No fracture or dislocation. Soft tissues are unremarkable. IMPRESSION: 1. No acute findings. 2. Mild degenerative changes as described. Electronically Signed   By: Marin Olp M.D.   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 - Endometrial biopsy on 10/10/2020 consistent with carcinosarcoma, MMR preserved. - CT scan of the abdomen and pelvis with contrast on 10/29/2020 with  endometrial mass, no compelling findings of metastatic disease in the abdomen or pelvis.  30 cm fatty lesion intraluminally in the distal transverse colon probably lipoma. - Robotic assisted laparoscopic total hysterectomy with bilateral salpingo-oophorectomy and sentinel lymph node biopsy by Dr. Denman George on 11/15/2020. - Pathology consistent with carcinosarcoma spanning 8.5 cm, tumor invades less than one half of myometrium, LVI positive, 1 lymph node involved with micrometastasis, PT1APN1 MI, MMR preserved, MSI-stable - She was evaluated by Dr. Denman George and 6 cycles of adjuvant chemotherapy with carboplatin and paclitaxel with vaginal brachytherapy was recommended. - Cycle 1 of carboplatin and paclitaxel on 01/23/2021.  2.  Social/family history: - Lives at home with her daughter and granddaughter.  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  She  quit smoking in September 2021.  Half pack per day smoker for 30 years.  She has severe right hip arthritis, requiring her to use a cane for ambulation. - No family history of malignancies.   PLAN:  Stage III C1 (PT1APN1A) endometrial carcinosarcoma: - S she did not experience any major GI side effects.  No dysuria. - Reviewed labs today which showed normal white count and platelet count with normal differential.  LFTs are grossly normal. - We will proceed with cycle 4 today.  Paclitaxel will be dose reduced to 140 mg per metered square. - RTC 3 weeks for labs and treatment.  2.  Normocytic anemia: - Hemoglobin 9.1 with MCV 92.  Anemia from myelosuppression.  3.  Hypomagnesemia: - Magnesium is 1.4.  She will have magnesium 3 g today.  Will increase magnesium to 3 times daily.  4.  Hypokalemia: - Continue potassium 20 mEq daily.  Potassium is 3.3.  5.  Peripheral neuropathy: - She has constant numbness in the fingertips and feet. - She reports left wrist numbness and burning pain. - She has finger contractures. - Recommend x-ray of both hands and left wrist. - We will start her on gabapentin 300 mg twice daily and titrate up.   Orders placed this encounter:  No orders of the defined types were placed in this encounter.    Derek Jack, MD [place] [phone]   I, Thana Ates, am acting as a scribe for Dr. Derek Jack.  I, Derek Jack MD, have reviewed the above documentation for accuracy and completeness, and I agree with the above.

## 2021-04-24 NOTE — Patient Instructions (Signed)
[place] at [facility] Discharge Instructions   You were seen and examined today by Dr. Delton Coombes.  We will proceed with your treatment today - we will discontinue one of the chemotherapy drugs, Taxol, due to the pain and numbness and tingling in your hands.  We will arrange for physical therapy to evaluate you to see if they can help you regain more strength in your right hand.   Continue gabapentin as prescribed.  We will see you back in 3 weeks.    Thank you for choosing [place] at [facility] to provide your oncology and hematology care.  To afford each patient quality time with our provider, please arrive at least 15 minutes before your scheduled appointment time.   If you have a lab appointment with the [facility] please come in thru the Main Entrance and check in at the main information desk.  You need to re-schedule your appointment should you arrive 10 or more minutes late.  We strive to give you quality time with our providers, and arriving late affects you and other patients whose appointments are after yours.  Also, if you no show three or more times for appointments you may be dismissed from the clinic at the providers discretion.     Again, thank you for choosing [facility].  Our hope is that these requests will decrease the amount of time that you wait before being seen by our physicians.       _____________________________________________________________  Should you have questions after your visit to [facility], please contact our office at [phone] and follow the prompts.  Our office hours are 8:00 a.m. and 4:30 p.m. Monday - Friday.  Please note that voicemails left after 4:00 p.m. may not be returned until the following business day.  We are closed weekends and major holidays.  You do have access to a nurse 24-7, just call the main number to the clinic [phone] and do not press any  options, hold on the line and a nurse will answer the phone.    For prescription refill requests, have your pharmacy contact our office and allow 72 hours.    Due to Covid, you will need to wear a mask upon entering the hospital. If you do not have a mask, a mask will be given to you at the Main Entrance upon arrival. For doctor visits, patients may have 1 support person age 23 or older with them. For treatment visits, patients can not have anyone with them due to social distancing guidelines and our immunocompromised population.

## 2021-04-24 NOTE — Progress Notes (Signed)
Patients port flushed without difficulty.  Good blood return noted with no bruising or swelling noted at site.  Stable during access and blood draw.  Patient to remain accessed for treatment. 

## 2021-04-25 ENCOUNTER — Telehealth: Payer: Self-pay | Admitting: *Deleted

## 2021-04-25 NOTE — Telephone Encounter (Signed)
CALLED PATIENT TO ASK ABOUT SETTING UP NEW HDR [facility], LVM FOR A RETURN CALL

## 2021-04-25 NOTE — Telephone Encounter (Signed)
Called patient to ask about setting up HDR [facility] , spoke with patient's daughter- Crystal and lvm for a return call

## 2021-04-25 NOTE — Telephone Encounter (Signed)
RETURNED PATIENT'S PHONE CALL, [place]

## 2021-04-26 ENCOUNTER — Ambulatory Visit (HOSPITAL_COMMUNITY): Payer: PRIVATE HEALTH INSURANCE

## 2021-04-26 ENCOUNTER — Telehealth: Payer: Self-pay | Admitting: *Deleted

## 2021-04-26 NOTE — Telephone Encounter (Signed)
CALLED PATIENT TO ASK ABOUT SCHEDULING NEW HDR [place] , PATIENT AGREED TO COME ON 05-08-21- ARRIVAL TIME- 7:45 AM @ [facility], PATIENT STATED THAT SHE WOULD GET HER OWN RIDE

## 2021-05-01 ENCOUNTER — Inpatient Hospital Stay (HOSPITAL_COMMUNITY): Payer: 59

## 2021-05-01 ENCOUNTER — Encounter (HOSPITAL_COMMUNITY): Payer: Self-pay

## 2021-05-01 ENCOUNTER — Other Ambulatory Visit: Payer: Self-pay

## 2021-05-01 VITALS — BP 109/75 | HR 116 | Temp 97.3°F | Ht 59.0 in | Wt 186.2 lb

## 2021-05-01 DIAGNOSIS — Z95828 Presence of other vascular implants and grafts: Secondary | ICD-10-CM

## 2021-05-01 DIAGNOSIS — Z5111 Encounter for antineoplastic chemotherapy: Secondary | ICD-10-CM | POA: Diagnosis not present

## 2021-05-01 DIAGNOSIS — C541 Malignant neoplasm of endometrium: Secondary | ICD-10-CM

## 2021-05-01 LAB — CBC WITH DIFFERENTIAL/PLATELET
Abs Immature Granulocytes: 0.01 10*3/uL (ref 0.00–0.07)
Basophils Absolute: 0 10*3/uL (ref 0.0–0.1)
Basophils Relative: 0 %
Eosinophils Absolute: 0 10*3/uL (ref 0.0–0.5)
Eosinophils Relative: 1 %
HCT: 28.2 % — ABNORMAL LOW (ref 36.0–46.0)
Hemoglobin: 9.1 g/dL — ABNORMAL LOW (ref 12.0–15.0)
Immature Granulocytes: 0 %
Lymphocytes Relative: 30 %
Lymphs Abs: 1.3 10*3/uL (ref 0.7–4.0)
MCH: 31 pg (ref 26.0–34.0)
MCHC: 32.3 g/dL (ref 30.0–36.0)
MCV: 95.9 fL (ref 80.0–100.0)
Monocytes Absolute: 0.3 10*3/uL (ref 0.1–1.0)
Monocytes Relative: 6 %
Neutro Abs: 2.7 10*3/uL (ref 1.7–7.7)
Neutrophils Relative %: 63 %
Platelets: 71 10*3/uL — ABNORMAL LOW (ref 150–400)
RBC: 2.94 MIL/uL — ABNORMAL LOW (ref 3.87–5.11)
RDW: 19.4 % — ABNORMAL HIGH (ref 11.5–15.5)
WBC: 4.3 10*3/uL (ref 4.0–10.5)
nRBC: 0 % (ref 0.0–0.2)

## 2021-05-01 LAB — COMPREHENSIVE METABOLIC PANEL
ALT: 9 U/L (ref 0–44)
AST: 13 U/L — ABNORMAL LOW (ref 15–41)
Albumin: 3.4 g/dL — ABNORMAL LOW (ref 3.5–5.0)
Alkaline Phosphatase: 62 U/L (ref 38–126)
Anion gap: 9 (ref 5–15)
BUN: 12 mg/dL (ref 8–23)
CO2: 25 mmol/L (ref 22–32)
Calcium: 8.8 mg/dL — ABNORMAL LOW (ref 8.9–10.3)
Chloride: 103 mmol/L (ref 98–111)
Creatinine, Ser: 0.57 mg/dL (ref 0.44–1.00)
GFR, Estimated: >60 mL/min (ref >60)
Glucose, Bld: 132 mg/dL — ABNORMAL HIGH (ref 70–99)
Potassium: 3.7 mmol/L (ref 3.5–5.1)
Sodium: 137 mmol/L (ref 135–145)
Total Bilirubin: 0.7 mg/dL (ref 0.3–1.2)
Total Protein: 7.1 g/dL (ref 6.5–8.1)

## 2021-05-01 LAB — MAGNESIUM: Magnesium: 1.5 mg/dL — ABNORMAL LOW (ref 1.7–2.4)

## 2021-05-01 MED ORDER — SODIUM CHLORIDE 0.9 % IV SOLN: Freq: Once | INTRAVENOUS | Status: AC

## 2021-05-01 MED ORDER — MAGNESIUM SULFATE 2 GM/50ML IV SOLN
2.0000 g | Freq: Once | INTRAVENOUS | Status: AC
  Administered 2021-05-01: 2 g via INTRAVENOUS
  Filled 2021-05-01: qty 50

## 2021-05-01 MED ORDER — SODIUM CHLORIDE 0.9% FLUSH
10.0000 mL | Freq: Once | INTRAVENOUS | Status: AC
  Administered 2021-05-01: 10 mL via INTRAVENOUS

## 2021-05-01 MED ORDER — HEPARIN SOD (PORK) LOCK FLUSH 100 UNIT/ML IV SOLN
500.0000 [IU] | Freq: Once | INTRAVENOUS | Status: AC
  Administered 2021-05-01: 500 [IU] via INTRAVENOUS

## 2021-05-01 NOTE — Progress Notes (Signed)
Labs reviewed today. Treatment held today per MD. Platelets 71, 000 today. Will give Magnesium per orders.   Patient tolerated it well without problems. Vitals stable and discharged home from clinic via wheelchair. Follow up as scheduled.

## 2021-05-01 NOTE — Patient Instructions (Signed)
[facility]  Discharge Instructions: Thank you for choosing [facility] to provide your oncology and hematology care.  If you have a lab appointment with the Cancer Center, please come in thru the Main Entrance and check in at the main information desk.  Wear comfortable clothing and clothing appropriate for easy access to any Portacath or PICC line.   We strive to give you quality time with your provider. You may need to reschedule your appointment if you arrive late (15 or more minutes).  Arriving late affects you and other patients whose appointments are after yours.  Also, if you miss three or more appointments without notifying the office, you may be dismissed from the clinic at the provider's discretion.      For prescription refill requests, have your pharmacy contact our office and allow 72 hours for refills to be completed.        To help prevent nausea and vomiting after your treatment, we encourage you to take your nausea medication as directed.  BELOW ARE SYMPTOMS THAT SHOULD BE REPORTED IMMEDIATELY: *FEVER GREATER THAN 100.4 F (38 C) OR HIGHER *CHILLS OR SWEATING *NAUSEA AND VOMITING THAT IS NOT CONTROLLED WITH YOUR NAUSEA MEDICATION *UNUSUAL SHORTNESS OF BREATH *UNUSUAL BRUISING OR BLEEDING *URINARY PROBLEMS (pain or burning when urinating, or frequent urination) *BOWEL PROBLEMS (unusual diarrhea, constipation, pain near the anus) TENDERNESS IN MOUTH AND THROAT WITH OR WITHOUT PRESENCE OF ULCERS (sore throat, sores in mouth, or a toothache) UNUSUAL RASH, SWELLING OR PAIN  UNUSUAL VAGINAL DISCHARGE OR ITCHING   Items with * indicate a potential emergency and should be followed up as soon as possible or go to the Emergency Department if any problems should occur.  Please show the CHEMOTHERAPY ALERT CARD or IMMUNOTHERAPY ALERT CARD at check-in to the Emergency Department and triage nurse.  Should you have questions after your visit or need to cancel  or reschedule your appointment, please contact [facility] [phone]  and follow the prompts.  Office hours are 8:00 a.m. to 4:30 p.m. Monday - Friday. Please note that voicemails left after 4:00 p.m. may not be returned until the following business day.  We are closed weekends and major holidays. You have access to a nurse at all times for urgent questions. Please call the main number to the clinic [phone] and follow the prompts.  For any non-urgent questions, you may also contact your provider using MyChart. We now offer e-Visits for anyone 18 and older to request care online for non-urgent symptoms. For details visit [email or web address].   Also download the MyChart app! Go to the app store, search "MyChart", open the app, select [facility], and log in with your MyChart username and password.  Due to Covid, a mask is required upon entering the hospital/clinic. If you do not have a mask, one will be given to you upon arrival. For doctor visits, patients may have 1 support person aged 18 or older with them. For treatment visits, patients cannot have anyone with them due to current Covid guidelines and our immunocompromised population.  

## 2021-05-02 ENCOUNTER — Telehealth (HOSPITAL_COMMUNITY): Payer: Self-pay | Admitting: Dietician

## 2021-05-02 NOTE — Telephone Encounter (Signed)
Patient identified on malnutrition screening report (MST) for weight loss and poor appetite.   Attempted to contact patient to introduce self and services available at [facility]. Patient did not answer. Left voicemail with request for return call. Contact information provided.

## 2021-05-02 NOTE — Progress Notes (Signed)
Michele Romero is here today for HDR [facility] fitting and treatment.  They did not have external beam radiation therapy.  Does the patient complain of any of the following:  Pain:denies Abdominal bloating: denies Diarrhea/Constipation: denies Nausea/Vomiting: denies Vaginal Discharge: yellow discharge Blood in Urine or Stool: denies Urinary Issues (dysuria/incomplete emptying/ incontinence/ increased frequency/urgency/nocturia): incontinence, frequency, urgency, nocturia x2-3   Additional comments if applicable: none  Vitals:   05/08/21 0815  BP: 100/78  Pulse: (!) 111  Resp: 20  Temp: 97.8 F (36.6 C)  SpO2: 100%  Weight: 186 lb 12.8 oz (84.7 kg)  Height: 5\' 2"  (1.575 m)

## 2021-05-03 ENCOUNTER — Ambulatory Visit (HOSPITAL_COMMUNITY): Payer: PRIVATE HEALTH INSURANCE

## 2021-05-07 ENCOUNTER — Encounter (HOSPITAL_COMMUNITY): Payer: Self-pay | Admitting: Hematology

## 2021-05-07 ENCOUNTER — Inpatient Hospital Stay (HOSPITAL_COMMUNITY): Payer: 59

## 2021-05-07 ENCOUNTER — Ambulatory Visit (HOSPITAL_COMMUNITY): Payer: 59 | Admitting: Occupational Therapy

## 2021-05-07 ENCOUNTER — Telehealth (HOSPITAL_COMMUNITY): Payer: Self-pay | Admitting: Hematology

## 2021-05-07 ENCOUNTER — Telehealth: Payer: Self-pay | Admitting: *Deleted

## 2021-05-07 ENCOUNTER — Other Ambulatory Visit: Payer: Self-pay

## 2021-05-07 DIAGNOSIS — D702 Other drug-induced agranulocytosis: Secondary | ICD-10-CM | POA: Insufficient documentation

## 2021-05-07 DIAGNOSIS — Z5111 Encounter for antineoplastic chemotherapy: Secondary | ICD-10-CM | POA: Diagnosis not present

## 2021-05-07 DIAGNOSIS — C541 Malignant neoplasm of endometrium: Secondary | ICD-10-CM

## 2021-05-07 LAB — COMPREHENSIVE METABOLIC PANEL
ALT: 10 U/L (ref 0–44)
AST: 16 U/L (ref 15–41)
Albumin: 3.4 g/dL — ABNORMAL LOW (ref 3.5–5.0)
Alkaline Phosphatase: 66 U/L (ref 38–126)
Anion gap: 10 (ref 5–15)
BUN: 12 mg/dL (ref 8–23)
CO2: 26 mmol/L (ref 22–32)
Calcium: 9.3 mg/dL (ref 8.9–10.3)
Chloride: 103 mmol/L (ref 98–111)
Creatinine, Ser: 0.58 mg/dL (ref 0.44–1.00)
GFR, Estimated: >60 mL/min (ref >60)
Glucose, Bld: 120 mg/dL — ABNORMAL HIGH (ref 70–99)
Potassium: 3.7 mmol/L (ref 3.5–5.1)
Sodium: 139 mmol/L (ref 135–145)
Total Bilirubin: 0.2 mg/dL — ABNORMAL LOW (ref 0.3–1.2)
Total Protein: 7.3 g/dL (ref 6.5–8.1)

## 2021-05-07 LAB — CBC WITH DIFFERENTIAL/PLATELET
Abs Immature Granulocytes: 0 10*3/uL (ref 0.00–0.07)
Basophils Absolute: 0 10*3/uL (ref 0.0–0.1)
Basophils Relative: 0 %
Eosinophils Absolute: 0.1 10*3/uL (ref 0.0–0.5)
Eosinophils Relative: 2 %
HCT: 29.5 % — ABNORMAL LOW (ref 36.0–46.0)
Hemoglobin: 9.5 g/dL — ABNORMAL LOW (ref 12.0–15.0)
Immature Granulocytes: 0 %
Lymphocytes Relative: 60 %
Lymphs Abs: 1.6 10*3/uL (ref 0.7–4.0)
MCH: 31.4 pg (ref 26.0–34.0)
MCHC: 32.2 g/dL (ref 30.0–36.0)
MCV: 97.4 fL (ref 80.0–100.0)
Monocytes Absolute: 0.3 10*3/uL (ref 0.1–1.0)
Monocytes Relative: 10 %
Neutro Abs: 0.8 10*3/uL — ABNORMAL LOW (ref 1.7–7.7)
Neutrophils Relative %: 28 %
Platelets: 164 10*3/uL (ref 150–400)
RBC: 3.03 MIL/uL — ABNORMAL LOW (ref 3.87–5.11)
RDW: 18.3 % — ABNORMAL HIGH (ref 11.5–15.5)
WBC: 2.7 10*3/uL — ABNORMAL LOW (ref 4.0–10.5)
nRBC: 0 % (ref 0.0–0.2)

## 2021-05-07 LAB — MAGNESIUM: Magnesium: 1.6 mg/dL — ABNORMAL LOW (ref 1.7–2.4)

## 2021-05-07 MED ORDER — SODIUM CHLORIDE 0.9% FLUSH
10.0000 mL | Freq: Once | INTRAVENOUS | Status: AC
  Administered 2021-05-07: 10:00:00 10 mL via INTRAVENOUS

## 2021-05-07 MED ORDER — HEPARIN SOD (PORK) LOCK FLUSH 100 UNIT/ML IV SOLN
500.0000 [IU] | Freq: Once | INTRAVENOUS | Status: AC
Start: 2021-05-07 — End: 2021-05-07
  Administered 2021-05-07: 10:00:00 500 [IU] via INTRAVENOUS

## 2021-05-07 MED ORDER — FILGRASTIM-SNDZ 480 MCG/0.8ML IJ SOSY
480.0000 ug | PREFILLED_SYRINGE | Freq: Once | INTRAMUSCULAR | Status: AC
  Administered 2021-05-07: 10:00:00 480 ug via SUBCUTANEOUS
  Filled 2021-05-07: qty 0.8

## 2021-05-07 NOTE — Patient Instructions (Signed)
Michele Romero  Discharge Instructions: Thank you for choosing [facility] to provide your oncology and hematology care.  If you have a lab appointment with the [facility], please come in thru the Main Entrance and check in at the main information desk.  Wear comfortable clothing and clothing appropriate for easy access to any Portacath or PICC line.   We strive to give you quality time with your provider. You may need to reschedule your appointment if you arrive late (15 or more minutes).  Arriving late affects you and other patients whose appointments are after yours.  Also, if you miss three or more appointments without notifying the office, you may be dismissed from the clinic at the providers discretion.      For prescription refill requests, have your pharmacy contact our office and allow 72 hours for refills to be completed.    Today you received the following chemotherapy and/or immunotherapy agents Zarixo      To help prevent nausea and vomiting after your treatment, we encourage you to take your nausea medication as directed.  BELOW ARE SYMPTOMS THAT SHOULD BE REPORTED IMMEDIATELY: *FEVER GREATER THAN 100.4 F (38 C) OR HIGHER *CHILLS OR SWEATING *NAUSEA AND VOMITING THAT IS NOT CONTROLLED WITH YOUR NAUSEA MEDICATION *UNUSUAL SHORTNESS OF BREATH *UNUSUAL BRUISING OR BLEEDING *URINARY PROBLEMS (pain or burning when urinating, or frequent urination) *BOWEL PROBLEMS (unusual diarrhea, constipation, pain near the anus) TENDERNESS IN MOUTH AND THROAT WITH OR WITHOUT PRESENCE OF ULCERS (sore throat, sores in mouth, or a toothache) UNUSUAL RASH, SWELLING OR PAIN  UNUSUAL VAGINAL DISCHARGE OR ITCHING   Items with * indicate a potential emergency and should be followed up as soon as possible or go to the Emergency Department if any problems should occur.  Please show the CHEMOTHERAPY ALERT CARD or IMMUNOTHERAPY ALERT CARD at check-in to the Emergency  Department and triage nurse.  Should you have questions after your visit or need to cancel or reschedule your appointment, please contact [facility] [phone]  and follow the prompts.  Office hours are 8:00 a.m. to 4:30 p.m. Monday - Friday. Please note that voicemails left after 4:00 p.m. may not be returned until the following business day.  We are closed weekends and major holidays. You have access to a nurse at all times for urgent questions. Please call the main number to the clinic [phone] and follow the prompts.  For any non-urgent questions, you may also contact your provider using MyChart. We now offer e-Visits for anyone 24 and older to request care online for non-urgent symptoms. For details visit [email or web address].   Also download the MyChart app! Go to the app store, search "MyChart", open the app, select [facility], and log in with your MyChart username and password.  Due to Covid, a mask is required upon entering the hospital/clinic. If you do not have a mask, one will be given to you upon arrival. For doctor visits, patients may have 1 support person aged 68 or older with them. For treatment visits, patients cannot have anyone with them due to current Covid guidelines and our immunocompromised population.

## 2021-05-07 NOTE — Progress Notes (Signed)
Radiation Oncology         (336) [phone] ________________________________  Name: Michele Romero MRN: [id number]  Date: 05/08/2021  DOB: November 25, 1960  Vaginal Brachytherapy Procedure Note  CC: [name], NP Everitt Amber, MD    ICD-10-CM   1. Endometrial cancer ([place])  C54.1     2. Dysuria  R30.0 Urinalysis, Complete w Microscopic    Urine culture      Diagnosis: The encounter diagnosis was Endometrial cancer ([place]).   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     Narrative: She returns today for vaginal cylinder fitting.  She will complete her fifth cycle of chemotherapy next week.  She has had a lot of problems with peripheral neuropathy and is unsteady on her feet.  She often will use a wheelchair in light of this issue.  She denies any pelvic pain or vaginal bleeding.  She has noticed some burning with urination and we will send her up to the lab for urinalysis culture and sensitivity later today.  Since she was last seen for consultation, the patient followed up with Dr. Delton Coombes on 01/23/21 prior to starting cycle #1 of Carboplatin and Taxol. Overall, the patient was noted to be doing well, and denied any abdominal pain, tingling, or numbness.   Of note: the patient presented to the ED on 01/25/21 for chest pain which has not recurred.  During follow up with Dr. Delton Coombes on 02/13/21, the patient reported worsened intermittent numbness and tingling in her fingertips and feet; and denied any associated pain, but reported occasionally dropping objects and inability to open bottle caps. She also reported occasional nausea after eating for 1 week following treatment, constipation, mild fatigue, and left abdominal pain.   Bilateral hand x-ray's were ordered on 04/03/21 to assess neuropathy which revealed no acute findings other than mild degenerative changes in the right hand.  During her most recent follow up with Dr. Delton Coombes on 04/24/21, the patient  reported severe numbness in her hands and feet, and inability to hold any objects. She reported that her fingers are drawn due to numbness as well, which has not been relieved with Gabapentin. The numbness in her feet also makes it difficult for her to walk, and she reported a pins-and-needles sensation constantly in her hands bilaterally. In addition, she stated that she had diarrhea for 5 days at the end of December which had since resolved. On evaluation, Dr. Delton Coombes noted that she is unable to make a fist with either hand, though her movement is slightly better in the right hand. The patient reported her appetite to be at 25% of her baseline. Given her severe peripheral neuropathy, Dr. Delton Coombes accordingly reduced paclitaxel to 140 mg / metered squared, and added gabapentin 300 mg twice daily.      ALLERGIES: is allergic to codeine.  Meds: Current Outpatient Medications  Medication Sig Dispense Refill   albuterol (VENTOLIN HFA) 108 (90 Base) MCG/ACT inhaler Inhale 2 puffs into the lungs every 6 (six) hours as needed for wheezing or shortness of breath. 8 g 2   budesonide-formoterol (SYMBICORT) 80-4.5 MCG/ACT inhaler Inhale 2 puffs into the lungs 2 (two) times daily. 10.2 g 2   calcium carbonate (TUMS - DOSED IN MG ELEMENTAL CALCIUM) 500 MG chewable tablet Chew 1 tablet by mouth as needed for indigestion or heartburn.     CARBOPLATIN IV Inject into the vein every 21 ( twenty-one) days.     gabapentin (NEURONTIN) 300 MG capsule Take 1 capsule (  300 mg total) by mouth 2 (two) times daily. 60 capsule 1   ibuprofen (ADVIL) 800 MG tablet Take 1 tablet (800 mg total) by mouth every 8 (eight) hours as needed for moderate pain. For AFTER surgery only 30 tablet 0   loratadine (CLARITIN) 10 MG tablet Take 1 tablet (10 mg total) by mouth daily. 30 tablet 2   magnesium oxide (MAG-OX) 400 (240 Mg) MG tablet Take 1 tablet (400 mg total) by mouth in the morning, at noon, and at bedtime. 90 tablet 3    Multiple Vitamin (MULTIVITAMIN WITH MINERALS) TABS tablet Take 1 tablet by mouth daily.     ondansetron (ZOFRAN ODT) 8 MG disintegrating tablet Take 1 tablet (8 mg total) by mouth every 8 (eight) hours as needed for nausea or vomiting. 8 tablet 0   PACLITAXEL IV Inject into the vein every 21 ( twenty-one) days.     pantoprazole (PROTONIX) 40 MG tablet Take 1 tablet (40 mg total) by mouth daily. 30 tablet 1   Pegfilgrastim-cbqv (UDENYCA [place]) Inject into the skin every 21 ( twenty-one) days. Administer on Day 3 of each chemotherapy cycle     potassium chloride SA (KLOR-CON M) 20 MEQ tablet Take 1 tablet (20 mEq total) by mouth daily. 30 tablet 3   senna-docusate (SENOKOT-S) 8.6-50 MG tablet Take 2 tablets by mouth at bedtime. For AFTER surgery, do not take if having diarrhea 30 tablet 0   vitamin B-12 (CYANOCOBALAMIN) 500 MCG tablet Take 500 mcg by mouth daily.     lidocaine-prilocaine (EMLA) cream Apply small amount to port a cath site and cover with plastic wrap 1 hour prior to chemotherapy appointments (Patient not taking: Reported on 05/08/2021) 30 g 3   prochlorperazine (COMPAZINE) 10 MG tablet Take 1 tablet (10 mg total) by mouth every 6 (six) hours as needed for nausea or vomiting. (Patient not taking: Reported on 05/08/2021) 30 tablet 3   traMADol (ULTRAM) 50 MG tablet Take 1 tablet (50 mg total) by mouth every 6 (six) hours as needed for severe pain. For AFTER surgery only, do not take and drive (Patient not taking: Reported on 05/08/2021) 10 tablet 0   No current facility-administered medications for this encounter.    Physical Findings: The patient is in no acute distress. Patient is alert and oriented.  height is 5\' 2"  (1.575 m) and weight is 186 lb 12.8 oz (84.7 kg). Her temperature is 97.8 F (36.6 C). Her blood pressure is 100/78 and her pulse is 111 (abnormal). Her respiration is 20 and oxygen saturation is 100%.   No palpable cervical, supraclavicular or axillary lymphoadenopathy. The  heart has a regular rate and rhythm. The lungs are clear to auscultation. Abdomen soft and non-tender.  On pelvic examination the external genitalia were unremarkable. A speculum exam was performed. Vaginal cuff intact, no mucosal lesions. On bimanual exam there were no pelvic masses appreciated.  She has a significant milky vaginal discharge consistent with candidal infection.  Lab Findings: Lab Results  Component Value Date   WBC 2.7 (L) 05/07/2021   HGB 9.5 (L) 05/07/2021   HCT 29.5 (L) 05/07/2021   MCV 97.4 05/07/2021   PLT 164 05/07/2021    Radiographic Findings: No results found.  Impression: The encounter diagnosis was Endometrial cancer ([place]).   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    Patient was fitted for a vaginal cylinder. The patient will be treated with a 2.5 cm diameter cylinder with a treatment length of 3.5  cm. This distended the vaginal vault without undue discomfort. The patient tolerated the procedure well.  The patient was successfully fitted for a vaginal cylinder. The patient is appropriate to begin vaginal brachytherapy.   Plan: The patient will proceed with CT simulation and vaginal brachytherapy today.  She will present to the lab for urinalysis culture and sensitivity.  I will also place her on Diflucan for vaginal candidiasis.  _______________________________   Blair Promise, PhD, MD  This document serves as a record of services personally performed by Gery Pray, MD. It was created on his behalf by Roney Mans, a trained medical scribe. The creation of this record is based on the scribe's personal observations and the provider's statements to them. This document has been checked and approved by the attending provider.

## 2021-05-07 NOTE — Progress Notes (Signed)
°  Radiation Oncology         (336) [phone] ________________________________  Name: Michele Romero MRN: [id number]  Date: 05/08/2021  DOB: [birth year]  CC: [name], NP  Everitt Amber, MD  HDR BRACHYTHERAPY NOTE  DIAGNOSIS: The encounter diagnosis was Endometrial cancer [facility]).   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   Simple treatment device note: Patient had construction of her custom vaginal cylinder. She will be treated with a 2.5 cm diameter segmented cylinder. This conforms to her anatomy without undue discomfort.  Vaginal brachytherapy procedure node: The patient was brought to the [place] suite. Identity was confirmed. All relevant records and images related to the planned course of therapy were reviewed. The patient freely provided informed written consent to proceed with treatment after reviewing the details related to the planned course of therapy. The consent form was witnessed and verified by the simulation staff. Then, the patient was set-up in a stable reproducible supine position for radiation therapy. Pelvic exam revealed the vaginal cuff to be intact . The patient's custom vaginal cylinder was placed in the proximal vagina. This was affixed to the CT/MR stabilization plate to prevent slippage. Patient tolerated the placement well.  Verification simulation note:  A fiducial marker was placed within the vaginal cylinder. An AP and lateral film was then obtained through the pelvis area. This documented accurate position of the vaginal cylinder for treatment.  HDR BRACHYTHERAPY TREATMENT  The remote afterloading device was affixed to the vaginal cylinder by catheter. Patient then proceeded to undergo her first high-dose-rate treatment directed at the proximal vagina. The patient was prescribed a dose of 6.0 gray to be delivered to the mucosal surface. Treatment length was 3.5 cm. Patient was treated with 1 channel using 8 dwell positions. Treatment time was  268.5 seconds. Iridium 192 was the high-dose-rate source for treatment. The patient tolerated the treatment well. After completion of her therapy, a radiation survey was performed documenting return of the iridium source into the [employer] safe.   PLAN: The patient will return next week for her second high-dose-rate treatment. ________________________________  -----------------------------------  Blair Promise, PhD, MD  This document serves as a record of services personally performed by Gery Pray, MD. It was created on his behalf by Roney Mans, a trained medical scribe. The creation of this record is based on the scribe's personal observations and the provider's statements to them. This document has been checked and approved by the attending provider.

## 2021-05-07 NOTE — Telephone Encounter (Signed)
Submitted transportation request for pt  Pending

## 2021-05-07 NOTE — Telephone Encounter (Signed)
CALLED PATIENT TO REMIND OF HDR [place] FOR 05-08-21- ARRIVAL TIME- 7:45 AM @ [place], LVM FOR A RETURN CALL

## 2021-05-07 NOTE — Progress Notes (Signed)
Patient presents today for Carboplatin per providers order.  Vital signs within parameters for treatment.  Labs pending.  Patient has no new complaints at this time.  ANC noted to be 0.8, MD notified.  Treatment to be held today, Zarixo injection to be given per Dr. Delton Coombes.  Patient to return Thursday for lab redraw and possible treatment.    Patient given a schedule and discussed the importance of showing up for her appointments.  Patient expressed understanding, stating that she already had transportation for tomorrows appointments and will make sure she has transportation for the rest of the week.  Zariox administration without incident; injection site WNL; see MAR for injection details.  Patient tolerated procedure well and without incident.  No questions or complaints noted at this time.  No complaints at this time.  Discharge from clinic via wheelchair in stable condition.  Alert and oriented X 3.  Follow up with [facility] as scheduled.

## 2021-05-08 ENCOUNTER — Ambulatory Visit
Admission: RE | Admit: 2021-05-08 | Discharge: 2021-05-08 | Disposition: A | Discharge status: Home / Self Care | Payer: 59 | Source: Ambulatory Visit | Attending: Radiation Oncology | Admitting: Radiation Oncology

## 2021-05-08 ENCOUNTER — Encounter: Payer: Self-pay | Admitting: Radiation Oncology

## 2021-05-08 ENCOUNTER — Ambulatory Visit (HOSPITAL_COMMUNITY): Payer: 59 | Attending: Hematology

## 2021-05-08 VITALS — BP 100/78 | HR 111 | Temp 97.8°F | Resp 20 | Ht 62.0 in | Wt 186.8 lb

## 2021-05-08 DIAGNOSIS — C541 Malignant neoplasm of endometrium: Secondary | ICD-10-CM

## 2021-05-08 DIAGNOSIS — R300 Dysuria: Secondary | ICD-10-CM

## 2021-05-08 LAB — URINALYSIS, COMPLETE (UACMP) WITH MICROSCOPIC
Bilirubin Urine: NEGATIVE
Glucose, UA: NEGATIVE mg/dL
Hgb urine dipstick: NEGATIVE
Ketones, ur: NEGATIVE mg/dL
Nitrite: NEGATIVE
Protein, ur: NEGATIVE mg/dL
Specific Gravity, Urine: 1.026 (ref 1.005–1.030)
pH: 6 (ref 5.0–8.0)

## 2021-05-08 MED ORDER — FLUCONAZOLE 150 MG PO TABS: 150.0000 mg | ORAL_TABLET | Freq: Every day | ORAL | 0 refills | Status: DC

## 2021-05-08 NOTE — Progress Notes (Signed)
See MD note for nursing evaluation. °

## 2021-05-09 ENCOUNTER — Other Ambulatory Visit: Payer: Self-pay

## 2021-05-09 ENCOUNTER — Inpatient Hospital Stay (HOSPITAL_COMMUNITY): Payer: 59

## 2021-05-09 ENCOUNTER — Ambulatory Visit (HOSPITAL_COMMUNITY): Payer: PRIVATE HEALTH INSURANCE

## 2021-05-09 VITALS — BP 101/63 | HR 81 | Temp 97.5°F | Resp 18

## 2021-05-09 DIAGNOSIS — Z95828 Presence of other vascular implants and grafts: Secondary | ICD-10-CM

## 2021-05-09 DIAGNOSIS — C541 Malignant neoplasm of endometrium: Secondary | ICD-10-CM

## 2021-05-09 DIAGNOSIS — Z5111 Encounter for antineoplastic chemotherapy: Secondary | ICD-10-CM | POA: Diagnosis not present

## 2021-05-09 LAB — CBC WITH DIFFERENTIAL/PLATELET
Abs Immature Granulocytes: 0.08 10*3/uL — ABNORMAL HIGH (ref 0.00–0.07)
Basophils Absolute: 0 10*3/uL (ref 0.0–0.1)
Basophils Relative: 0 %
Eosinophils Absolute: 0.1 10*3/uL (ref 0.0–0.5)
Eosinophils Relative: 2 %
HCT: 28.8 % — ABNORMAL LOW (ref 36.0–46.0)
Hemoglobin: 9.2 g/dL — ABNORMAL LOW (ref 12.0–15.0)
Immature Granulocytes: 1 %
Lymphocytes Relative: 23 %
Lymphs Abs: 1.6 10*3/uL (ref 0.7–4.0)
MCH: 31.3 pg (ref 26.0–34.0)
MCHC: 31.9 g/dL (ref 30.0–36.0)
MCV: 98 fL (ref 80.0–100.0)
Monocytes Absolute: 0.5 10*3/uL (ref 0.1–1.0)
Monocytes Relative: 7 %
Neutro Abs: 4.7 10*3/uL (ref 1.7–7.7)
Neutrophils Relative %: 67 %
Platelets: 178 10*3/uL (ref 150–400)
RBC: 2.94 MIL/uL — ABNORMAL LOW (ref 3.87–5.11)
RDW: 18.9 % — ABNORMAL HIGH (ref 11.5–15.5)
WBC: 7 10*3/uL (ref 4.0–10.5)
nRBC: 0 % (ref 0.0–0.2)

## 2021-05-09 LAB — MAGNESIUM: Magnesium: 1.5 mg/dL — ABNORMAL LOW (ref 1.7–2.4)

## 2021-05-09 LAB — URINE CULTURE: Culture: NO GROWTH

## 2021-05-09 MED ORDER — DIPHENHYDRAMINE HCL 50 MG/ML IJ SOLN
25.0000 mg | Freq: Once | INTRAMUSCULAR | Status: DC
  Filled 2021-05-09: qty 1

## 2021-05-09 MED ORDER — SODIUM CHLORIDE 0.9 % IV SOLN
150.0000 mg | Freq: Once | INTRAVENOUS | Status: AC
  Administered 2021-05-09: 150 mg via INTRAVENOUS
  Filled 2021-05-09: qty 150

## 2021-05-09 MED ORDER — SODIUM CHLORIDE 0.9 % IV SOLN
740.0000 mg | Freq: Once | INTRAVENOUS | Status: AC
  Administered 2021-05-09: 740 mg via INTRAVENOUS
  Filled 2021-05-09: qty 74

## 2021-05-09 MED ORDER — SODIUM CHLORIDE 0.9 % IV SOLN
10.0000 mg | Freq: Once | INTRAVENOUS | Status: AC
  Administered 2021-05-09: 10 mg via INTRAVENOUS
  Filled 2021-05-09: qty 10

## 2021-05-09 MED ORDER — HEPARIN SOD (PORK) LOCK FLUSH 100 UNIT/ML IV SOLN
500.0000 [IU] | Freq: Once | INTRAVENOUS | Status: AC | PRN
  Administered 2021-05-09: 500 [IU]

## 2021-05-09 MED ORDER — PALONOSETRON HCL INJECTION 0.25 MG/5ML
0.2500 mg | Freq: Once | INTRAVENOUS | Status: AC
  Administered 2021-05-09: 0.25 mg via INTRAVENOUS
  Filled 2021-05-09: qty 5

## 2021-05-09 MED ORDER — MAGNESIUM SULFATE 2 GM/50ML IV SOLN
2.0000 g | INTRAVENOUS | Status: AC
  Administered 2021-05-09 (×2): 2 g via INTRAVENOUS
  Filled 2021-05-09 (×2): qty 50

## 2021-05-09 MED ORDER — SODIUM CHLORIDE 0.9% FLUSH
10.0000 mL | INTRAVENOUS | Status: DC | PRN
  Administered 2021-05-09: 10 mL

## 2021-05-09 MED ORDER — SODIUM CHLORIDE 0.9 % IV SOLN: Freq: Once | INTRAVENOUS | Status: AC

## 2021-05-09 MED ORDER — FAMOTIDINE IN NACL 20-0.9 MG/50ML-% IV SOLN
20.0000 mg | Freq: Once | INTRAVENOUS | Status: AC
  Administered 2021-05-09: 20 mg via INTRAVENOUS
  Filled 2021-05-09: qty 50

## 2021-05-09 NOTE — Patient Instructions (Signed)
[facility]  Discharge Instructions: Thank you for choosing [facility] to provide your oncology and hematology care.  If you have a lab appointment with the Cancer Center, please come in thru the Main Entrance and check in at the main information desk.  Wear comfortable clothing and clothing appropriate for easy access to any Portacath or PICC line.   We strive to give you quality time with your provider. You may need to reschedule your appointment if you arrive late (15 or more minutes).  Arriving late affects you and other patients whose appointments are after yours.  Also, if you miss three or more appointments without notifying the office, you may be dismissed from the clinic at the provider's discretion.      For prescription refill requests, have your pharmacy contact our office and allow 72 hours for refills to be completed.    Today you received the following chemotherapy and/or immunotherapy agents    To help prevent nausea and vomiting after your treatment, we encourage you to take your nausea medication as directed.  BELOW ARE SYMPTOMS THAT SHOULD BE REPORTED IMMEDIATELY: . *FEVER GREATER THAN 100.4 F (38 C) OR HIGHER . *CHILLS OR SWEATING . *NAUSEA AND VOMITING THAT IS NOT CONTROLLED WITH YOUR NAUSEA MEDICATION . *UNUSUAL SHORTNESS OF BREATH . *UNUSUAL BRUISING OR BLEEDING . *URINARY PROBLEMS (pain or burning when urinating, or frequent urination) . *BOWEL PROBLEMS (unusual diarrhea, constipation, pain near the anus) . TENDERNESS IN MOUTH AND THROAT WITH OR WITHOUT PRESENCE OF ULCERS (sore throat, sores in mouth, or a toothache) . UNUSUAL RASH, SWELLING OR PAIN  . UNUSUAL VAGINAL DISCHARGE OR ITCHING   Items with * indicate a potential emergency and should be followed up as soon as possible or go to the Emergency Department if any problems should occur.  Please show the CHEMOTHERAPY ALERT CARD or IMMUNOTHERAPY ALERT CARD at check-in to the  Emergency Department and triage nurse.  Should you have questions after your visit or need to cancel or reschedule your appointment, please contact [facility] [phone]  and follow the prompts.  Office hours are 8:00 a.m. to 4:30 p.m. Monday - Friday. Please note that voicemails left after 4:00 p.m. may not be returned until the following business day.  We are closed weekends and major holidays. You have access to a nurse at all times for urgent questions. Please call the main number to the clinic [phone] and follow the prompts.  For any non-urgent questions, you may also contact your provider using MyChart. We now offer e-Visits for anyone 18 and older to request care online for non-urgent symptoms. For details visit [email or web address].   Also download the MyChart app! Go to the app store, search "MyChart", open the app, select [facility], and log in with your MyChart username and password.  Due to Covid, a mask is required upon entering the hospital/clinic. If you do not have a mask, one will be given to you upon arrival. For doctor visits, patients may have 1 support person aged 18 or older with them. For treatment visits, patients cannot have anyone with them due to current Covid guidelines and our immunocompromised population.  

## 2021-05-09 NOTE — Patient Instructions (Signed)
[facility]  Discharge Instructions: Thank you for choosing [facility] to provide your oncology and hematology care.  If you have a lab appointment with the [facility], please come in thru the Main Entrance and check in at the main information desk.  Wear comfortable clothing and clothing appropriate for easy access to any Portacath or PICC line.   We strive to give you quality time with your provider. You may need to reschedule your appointment if you arrive late (15 or more minutes).  Arriving late affects you and other patients whose appointments are after yours.  Also, if you miss three or more appointments without notifying the office, you may be dismissed from the clinic at the providers discretion.      For prescription refill requests, have your pharmacy contact our office and allow 72 hours for refills to be completed.    Today you received the following chemotherapy and/or immunotherapy agents Carboplatin and 4g IV Magnesium sulfate.    To help prevent nausea and vomiting after your treatment, we encourage you to take your nausea medication as directed.  BELOW ARE SYMPTOMS THAT SHOULD BE REPORTED IMMEDIATELY: *FEVER GREATER THAN 100.4 F (38 C) OR HIGHER *CHILLS OR SWEATING *NAUSEA AND VOMITING THAT IS NOT CONTROLLED WITH YOUR NAUSEA MEDICATION *UNUSUAL SHORTNESS OF BREATH *UNUSUAL BRUISING OR BLEEDING *URINARY PROBLEMS (pain or burning when urinating, or frequent urination) *BOWEL PROBLEMS (unusual diarrhea, constipation, pain near the anus) TENDERNESS IN MOUTH AND THROAT WITH OR WITHOUT PRESENCE OF ULCERS (sore throat, sores in mouth, or a toothache) UNUSUAL RASH, SWELLING OR PAIN  UNUSUAL VAGINAL DISCHARGE OR ITCHING   Items with * indicate a potential emergency and should be followed up as soon as possible or go to the Emergency Department if any problems should occur.  Please show the CHEMOTHERAPY ALERT CARD or IMMUNOTHERAPY ALERT CARD at  check-in to the Emergency Department and triage nurse.  Should you have questions after your visit or need to cancel or reschedule your appointment, please contact [facility] [phone]  and follow the prompts.  Office hours are 8:00 a.m. to 4:30 p.m. Monday - Friday. Please note that voicemails left after 4:00 p.m. may not be returned until the following business day.  We are closed weekends and major holidays. You have access to a nurse at all times for urgent questions. Please call the main number to the clinic [phone] and follow the prompts.  For any non-urgent questions, you may also contact your provider using MyChart. We now offer e-Visits for anyone 14 and older to request care online for non-urgent symptoms. For details visit [email or web address].   Also download the MyChart app! Go to the app store, search "MyChart", open the app, select [facility], and log in with your MyChart username and password.  Due to Covid, a mask is required upon entering the hospital/clinic. If you do not have a mask, one will be given to you upon arrival. For doctor visits, patients may have 1 support person aged 31 or older with them. For treatment visits, patients cannot have anyone with them due to current Covid guidelines and our immunocompromised population.

## 2021-05-09 NOTE — Progress Notes (Signed)
Order received to discontinue benadryl due to no longer on Taxol.  Magnesium level 1.5 - orders received to give magnesium sulfate 4 gm IVPB x 1 dose today.  T.O. Dr Rhys Martini, PharmD

## 2021-05-09 NOTE — Progress Notes (Signed)
Patients port flushed without difficulty.  Good blood return noted with no bruising or swelling noted at site.  Patient stable during access and blood draw.  To remain accessed for treatment.

## 2021-05-09 NOTE — Progress Notes (Signed)
Pt presents today for Carboplatin per provider's order. Vital signs and other labs WNL for treatment today. Pt's Magnesium is 1.5 today per Dr.K's standing orders, pt will receive 4g IV of Magnesium sulfate x 1 dose. Okay to proceed with treatment today.  Carboplatin and 4g IV of Magnesium Sulfate given today per MD orders. Tolerated infusion without adverse affects. Vital signs stable. No complaints at this time. Discharged from clinic via wheelchair in stable condition. Alert and oriented x 3. F/U with [facility] as scheduled.

## 2021-05-10 ENCOUNTER — Inpatient Hospital Stay (HOSPITAL_COMMUNITY): Payer: 59

## 2021-05-10 VITALS — BP 112/79 | HR 94 | Temp 96.9°F | Resp 18

## 2021-05-10 DIAGNOSIS — Z95828 Presence of other vascular implants and grafts: Secondary | ICD-10-CM

## 2021-05-10 DIAGNOSIS — C541 Malignant neoplasm of endometrium: Secondary | ICD-10-CM

## 2021-05-10 DIAGNOSIS — Z5111 Encounter for antineoplastic chemotherapy: Secondary | ICD-10-CM | POA: Diagnosis not present

## 2021-05-10 MED ORDER — PEGFILGRASTIM-CBQV 6 MG/0.6ML ~~LOC~~ SOSY
6.0000 mg | PREFILLED_SYRINGE | Freq: Once | SUBCUTANEOUS | Status: AC
  Administered 2021-05-10: 6 mg via SUBCUTANEOUS
  Filled 2021-05-10: qty 0.6

## 2021-05-10 NOTE — Patient Instructions (Signed)
[facility]  Discharge Instructions: Thank you for choosing [facility] to provide your oncology and hematology care.  If you have a lab appointment with the Cancer Center, please come in thru the Main Entrance and check in at the main information desk.  Wear comfortable clothing and clothing appropriate for easy access to any Portacath or PICC line.   We strive to give you quality time with your provider. You may need to reschedule your appointment if you arrive late (15 or more minutes).  Arriving late affects you and other patients whose appointments are after yours.  Also, if you miss three or more appointments without notifying the office, you may be dismissed from the clinic at the provider's discretion.      For prescription refill requests, have your pharmacy contact our office and allow 72 hours for refills to be completed.    Today you received the following chemotherapy and/or immunotherapy agents Udenyca      To help prevent nausea and vomiting after your treatment, we encourage you to take your nausea medication as directed.  BELOW ARE SYMPTOMS THAT SHOULD BE REPORTED IMMEDIATELY: *FEVER GREATER THAN 100.4 F (38 C) OR HIGHER *CHILLS OR SWEATING *NAUSEA AND VOMITING THAT IS NOT CONTROLLED WITH YOUR NAUSEA MEDICATION *UNUSUAL SHORTNESS OF BREATH *UNUSUAL BRUISING OR BLEEDING *URINARY PROBLEMS (pain or burning when urinating, or frequent urination) *BOWEL PROBLEMS (unusual diarrhea, constipation, pain near the anus) TENDERNESS IN MOUTH AND THROAT WITH OR WITHOUT PRESENCE OF ULCERS (sore throat, sores in mouth, or a toothache) UNUSUAL RASH, SWELLING OR PAIN  UNUSUAL VAGINAL DISCHARGE OR ITCHING   Items with * indicate a potential emergency and should be followed up as soon as possible or go to the Emergency Department if any problems should occur.  Please show the CHEMOTHERAPY ALERT CARD or IMMUNOTHERAPY ALERT CARD at check-in to the Emergency  Department and triage nurse.  Should you have questions after your visit or need to cancel or reschedule your appointment, please contact [facility] [phone]  and follow the prompts.  Office hours are 8:00 a.m. to 4:30 p.m. Monday - Friday. Please note that voicemails left after 4:00 p.m. may not be returned until the following business day.  We are closed weekends and major holidays. You have access to a nurse at all times for urgent questions. Please call the main number to the clinic [phone] and follow the prompts.  For any non-urgent questions, you may also contact your provider using MyChart. We now offer e-Visits for anyone 18 and older to request care online for non-urgent symptoms. For details visit [email or web address].   Also download the MyChart app! Go to the app store, search "MyChart", open the app, select [facility], and log in with your MyChart username and password.  Due to Covid, a mask is required upon entering the hospital/clinic. If you do not have a mask, one will be given to you upon arrival. For doctor visits, patients may have 1 support person aged 18 or older with them. For treatment visits, patients cannot have anyone with them due to current Covid guidelines and our immunocompromised population.  

## 2021-05-10 NOTE — Progress Notes (Signed)
Michele Romero presents today for Udenyca injection per the provider's orders.  Stable during administration without incident; injection site WNL; see MAR for injection details.  Patient tolerated procedure well and without incident.  No questions or complaints noted at this time. Discharge from clinic via wheelchair in stable condition.  Alert and oriented X 3.  Follow up with [facility] as scheduled.

## 2021-05-13 ENCOUNTER — Telehealth: Payer: Self-pay | Admitting: *Deleted

## 2021-05-13 NOTE — Progress Notes (Incomplete)
°  Radiation Oncology         [phone] ________________________________  Name: Michele Romero MRN: [id number]  Date: 05/14/2021  DOB: 03-06-1961  CC: [name], NP  [name], NP  HDR BRACHYTHERAPY NOTE  DIAGNOSIS: The encounter diagnosis was Endometrial cancer [facility]).   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   Simple treatment device note: Patient had construction of her custom vaginal cylinder. She will be treated with a 2.5 cm diameter segmented cylinder. This conforms to her anatomy without undue discomfort.  Vaginal brachytherapy procedure node: The patient was brought to the [place] suite. Identity was confirmed. All relevant records and images related to the planned course of therapy were reviewed. The patient freely provided informed written consent to proceed with treatment after reviewing the details related to the planned course of therapy. The consent form was witnessed and verified by the simulation staff. Then, the patient was set-up in a stable reproducible supine position for radiation therapy. Pelvic exam revealed the vaginal cuff to be intact . The patient's custom vaginal cylinder was placed in the proximal vagina. This was affixed to the CT/MR stabilization plate to prevent slippage. Patient tolerated the placement well.  Verification simulation note:  A fiducial marker was placed within the vaginal cylinder. An AP and lateral film was then obtained through the pelvis area. This documented accurate position of the vaginal cylinder for treatment.  HDR BRACHYTHERAPY TREATMENT  The remote afterloading device was affixed to the vaginal cylinder by catheter. Patient then proceeded to undergo her second high-dose-rate treatment directed at the proximal vagina. The patient was prescribed a dose of 6.0 gray to be delivered to the mucosal surface. Treatment length was 3.5 cm. Patient was treated with 1 channel using 8 dwell positions. Treatment time  was *** seconds. Iridium 192 was the high-dose-rate source for treatment. The patient tolerated the treatment well. After completion of her therapy, a radiation survey was performed documenting return of the iridium source into the GammaMed safe.   PLAN: The patient will return next week for her third high-dose-rate treatment. ________________________________  -----------------------------------  Blair Promise, PhD, MD  This document serves as a record of services personally performed by Gery Pray, MD. It was created on his behalf by Roney Mans, a trained medical scribe. The creation of this record is based on the scribe's personal observations and the provider's statements to them. This document has been checked and approved by the attending provider.

## 2021-05-13 NOTE — Telephone Encounter (Signed)
CALLED PATIENT TO REMIND OF [place]. FOR 05-14-21 @ 1 PM, SPOKE WITH PATIENT'S DAUGHTER CRYSTAL AND SHE IS AWARE OF THIS [place].

## 2021-05-14 ENCOUNTER — Ambulatory Visit
Admission: RE | Admit: 2021-05-14 | Discharge: 2021-05-14 | Disposition: A | Discharge status: Home / Self Care | Payer: 59 | Source: Ambulatory Visit | Attending: Radiation Oncology | Admitting: Radiation Oncology

## 2021-05-15 ENCOUNTER — Telehealth: Payer: Self-pay | Admitting: *Deleted

## 2021-05-15 ENCOUNTER — Ambulatory Visit (HOSPITAL_COMMUNITY): Payer: PRIVATE HEALTH INSURANCE

## 2021-05-15 ENCOUNTER — Other Ambulatory Visit (HOSPITAL_COMMUNITY): Payer: PRIVATE HEALTH INSURANCE

## 2021-05-15 ENCOUNTER — Ambulatory Visit (HOSPITAL_COMMUNITY): Payer: PRIVATE HEALTH INSURANCE | Admitting: Hematology

## 2021-05-15 NOTE — Telephone Encounter (Signed)
CALLED PATIENT TO ASK ABOUT RESCHEDULING MISSED HDR TX. I TOLD HER SINCE SHE HAS ONE ON 05-22-21, I WILL SPEAK TO DR. KINARD ABOUT RESCHEDULING HER AND CALL HER BACK, PATIENT VERIFIED UNDERSTANDING THIS

## 2021-05-15 NOTE — Telephone Encounter (Signed)
CALLED PATIENT TO INFORM OF HDR [place]. FOR 05-28-21 @ 1 PM, SPOKE WITH PATIENT AND SHE IS AWARE OF THIS [place].

## 2021-05-17 ENCOUNTER — Ambulatory Visit (HOSPITAL_COMMUNITY): Payer: PRIVATE HEALTH INSURANCE

## 2021-05-17 ENCOUNTER — Other Ambulatory Visit: Payer: Self-pay

## 2021-05-17 ENCOUNTER — Ambulatory Visit (HOSPITAL_COMMUNITY): Payer: 59

## 2021-05-17 DIAGNOSIS — Z5111 Encounter for antineoplastic chemotherapy: Secondary | ICD-10-CM | POA: Diagnosis not present

## 2021-05-17 DIAGNOSIS — R29898 Other symptoms and signs involving the musculoskeletal system: Secondary | ICD-10-CM

## 2021-05-17 DIAGNOSIS — G620 Drug-induced polyneuropathy: Secondary | ICD-10-CM | POA: Diagnosis not present

## 2021-05-17 DIAGNOSIS — R208 Other disturbances of skin sensation: Secondary | ICD-10-CM

## 2021-05-17 DIAGNOSIS — E876 Hypokalemia: Secondary | ICD-10-CM | POA: Diagnosis not present

## 2021-05-17 DIAGNOSIS — T451X5A Adverse effect of antineoplastic and immunosuppressive drugs, initial encounter: Secondary | ICD-10-CM | POA: Diagnosis not present

## 2021-05-17 DIAGNOSIS — Z87891 Personal history of nicotine dependence: Secondary | ICD-10-CM | POA: Diagnosis not present

## 2021-05-17 DIAGNOSIS — C541 Malignant neoplasm of endometrium: Secondary | ICD-10-CM | POA: Diagnosis present

## 2021-05-17 DIAGNOSIS — R278 Other lack of coordination: Secondary | ICD-10-CM | POA: Insufficient documentation

## 2021-05-17 DIAGNOSIS — Z79899 Other long term (current) drug therapy: Secondary | ICD-10-CM | POA: Diagnosis not present

## 2021-05-17 DIAGNOSIS — D649 Anemia, unspecified: Secondary | ICD-10-CM | POA: Diagnosis not present

## 2021-05-17 DIAGNOSIS — G63 Polyneuropathy in diseases classified elsewhere: Secondary | ICD-10-CM | POA: Insufficient documentation

## 2021-05-17 DIAGNOSIS — C801 Malignant (primary) neoplasm, unspecified: Secondary | ICD-10-CM | POA: Insufficient documentation

## 2021-05-20 ENCOUNTER — Telehealth: Payer: Self-pay

## 2021-05-20 ENCOUNTER — Encounter (HOSPITAL_COMMUNITY): Payer: Self-pay | Admitting: Hematology

## 2021-05-20 NOTE — Telephone Encounter (Signed)
Called to follow up with patient to make sure she was able to purchase a wheel chair. Patient needed a wheelchair in order to be transported by [employer]. Left voicemail.

## 2021-05-21 ENCOUNTER — Encounter (HOSPITAL_COMMUNITY): Payer: Self-pay

## 2021-05-21 ENCOUNTER — Telehealth: Payer: Self-pay | Admitting: *Deleted

## 2021-05-21 NOTE — Progress Notes (Signed)
°  Radiation Oncology         [phone] ________________________________  Name: JOELL BUERGER MRN: [id number]  Date: 05/22/2021  DOB: 24-Jul-1960  CC: [name], NP  [name], NP  HDR BRACHYTHERAPY NOTE  DIAGNOSIS: The encounter diagnosis was Endometrial cancer [facility]).   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   Simple treatment device note: Patient had construction of her custom vaginal cylinder. She will be treated with a 2.5 cm diameter segmented cylinder. This conforms to her anatomy without undue discomfort.  Vaginal brachytherapy procedure node: The patient was brought to the [place] suite. Identity was confirmed. All relevant records and images related to the planned course of therapy were reviewed. The patient freely provided informed written consent to proceed with treatment after reviewing the details related to the planned course of therapy. The consent form was witnessed and verified by the simulation staff. Then, the patient was set-up in a stable reproducible supine position for radiation therapy. Pelvic exam revealed the vaginal cuff to be intact . The patient's custom vaginal cylinder was placed in the proximal vagina. This was affixed to the CT/MR stabilization plate to prevent slippage. Patient tolerated the placement well.  Verification simulation note:  A fiducial marker was placed within the vaginal cylinder. An AP and lateral film was then obtained through the pelvis area. This documented accurate position of the vaginal cylinder for treatment.  HDR BRACHYTHERAPY TREATMENT  The remote afterloading device was affixed to the vaginal cylinder by catheter. Patient then proceeded to undergo her second high-dose-rate treatment directed at the proximal vagina. The patient was prescribed a dose of 6.0 gray to be delivered to the mucosal surface. Treatment length was 3.5 cm. Patient was treated with 1 channel using 8 dwell positions. Treatment time  was 180.8 seconds. Iridium 192 was the high-dose-rate source for treatment. The patient tolerated the treatment well. After completion of her therapy, a radiation survey was performed documenting return of the iridium source into the GammaMed safe.   PLAN: The patient will return next week for her third high-dose-rate treatment.  She reports her vaginal itching and discomfort is better after using the Diflucan given to her last week. ________________________________  -----------------------------------  Blair Promise, PhD, MD  This document serves as a record of services personally performed by Gery Pray, MD. It was created on his behalf by Roney Mans, a trained medical scribe. The creation of this record is based on the scribe's personal observations and the provider's statements to them. This document has been checked and approved by the attending provider.

## 2021-05-21 NOTE — Therapy (Addendum)
[place], [place], [address] Phone: [phone]   Fax:  [phone]  Occupational Therapy Evaluation  Patient Details  Name: Michele Romero MRN: [id number] Date of Birth: 1960-08-19 Referring Provider (OT): Derek Jack, MD   Encounter Date: 05/17/2021   OT End of Session - 05/21/21 1322     Visit Number 1    Number of Visits 6    Date for OT Re-Evaluation 06/28/21    Authorization Type [employer]    Authorization Time Period no copay. Visit limit: 30 OT/PT/Chiro - 0 used    Authorization - Visit Number 1    Authorization - Number of Visits 30    OT Start Time 0945    OT Stop Time 1030    OT Time Calculation (min) 45 min    Activity Tolerance Patient tolerated treatment well    Behavior During Therapy WFL for tasks assessed/performed             Past Medical History:  Diagnosis Date   Ambulates with cane 10/31/2020   Arthritis    RIGHT HIP   Asthma    Cigarette nicotine dependence without complication 71/69/6789   Complex tear of medial meniscus of right knee 01/20/2017   COPD (chronic obstructive pulmonary disease) ([place])    ENDOMETRIAL CANCER 10/31/2020   Endometrial cancer ([place]) 11/15/2020   GERD (gastroesophageal reflux disease)    Migraines    Port-A-Cath in place 01/01/2021    Past Surgical History:  Procedure Laterality Date   APPENDECTOMY     YRS AGO PER PT ON 10-31-2020   BIOPSY  11/29/2019   Procedure: BIOPSY;  Surgeon: Harvel Quale, MD;  [facility];  Service: Gastroenterology;;  ileocecal vlve   CHOLECYSTECTOMY     YRS AGO PER PT ON 10-31-2020   COLONOSCOPY WITH PROPOFOL N/A 11/29/2019   Procedure: COLONOSCOPY WITH PROPOFOL;  Surgeon: Harvel Quale, MD;  Location[facility];  Service: Gastroenterology;  Laterality: N/A;  930   HYSTEROSCOPY WITH D & C N/A 10/10/2020   Procedure: DILATATION AND CURETTAGE /HYSTEROSCOPY;  Surgeon:  Florian Buff, MD;  Location: [facility] OR[facility];  Service: Gynecology;  Laterality: N/A;   IR IMAGING GUIDED PORT INSERTION  01/08/2021   KNEE ARTHROPLASTY  2018   left knee   POLYPECTOMY  11/29/2019   Procedure: POLYPECTOMY;  Surgeon: Montez Morita, Quillian Quince, MD;  Location[facility];  Service: Gastroenterology;;  sigmoid colon    ROBOTIC ASSISTED TOTAL HYSTERECTOMY WITH BILATERAL SALPINGO OOPHERECTOMY N/A 11/15/2020   Procedure: XI ROBOTIC ASSISTED TOTAL HYSTERECTOMY GREATER THAN TWO HUNDRED AND FIFTY GRAMS WITH BILATERAL SALPINGO OOPHORECTOMY, MINI LAPAROTOMY;  Surgeon: Everitt Amber, MD;  Location: [facility];  Service: Gynecology;  Laterality: N/A;   SENTINEL NODE BIOPSY N/A 11/15/2020   Procedure: SENTINEL NODE BIOPSY;  Surgeon: Everitt Amber, MD;  [facility];  Service: Gynecology;  Laterality: N/A;   TUBAL LIGATION  04/14/1985    There were no vitals filed for this visit.   05/17/21 0954  Symptoms/Limitations  Subjective  S: It's been like this since I started the chemo.  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 She started chemo September 2022 and has one more round to do. The neuropathy has effected primarily her hands causing increased tone in the right and decreased tone in her left. Full recovery of  ROM and strength is not expected although patient would like to gain back some independence as she requires total assist to complete all ADL tasks. Dr. Delton Coombes has referred patient to occupational therapy for evaluation and treatment.  Patient Stated Goals to be able to do more for herself.  Pain Assessment  Currently in Pain? Yes  Pain Score 8  Pain Location Hand  Pain Orientation Right;Left  Pain Descriptors / Indicators Constant  Pain Type Neuropathic pain  Pain Radiating Towards Left hand pain radiates up into her left elbow  Pain Onset More than a  month ago  Pain Frequency Constant  Aggravating Factors  Touch is sensitive  Pain Relieving Factors Nothing. Patient has tried over the counter pain medication, gabepentin.  Effect of Pain on Daily Activities Unable to complete any daily tasks using her arms.      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  Referring Provider (OT) Derek Jack, MD  Onset Date/Surgical Date  (Sept. 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 Active endometrial cancer  Restrictions  Weight Bearing Restrictions No  Balance Screen  Has the patient fallen in the past 6 months Yes  How many times? 4 (last fall was last Friday)  Has the patient had a decrease in activity level because of a fear of falling?  Yes  Is the patient reluctant to leave their home because of a fear of falling?  Yes  Home  Environment  Family/patient expects to be discharged to: Private residence  Living Arrangements Other relatives  Available Help at Discharge Other (Comment) (Daughter works. 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  ADL  Eating/Feeding + 1  Total assistance   Grooming + 1 Total assistance  Upper Body Bathing + 1 Total asssestance (sponge bath)  Lower Body Bathing + 1 Total assistance (sponge bath)  Upper Body Dressing + 1 Total assistance  Lower Body Dressing +1 Total aassistance  Toilet Transfer Minimal assistance (hand held assist)  Toileting - Clothing Manipulation + 1 Total assistance  Toileting -  Hygiene + 1 Total assistance  Tub/Shower Transfer + 1 Total assistance (pt has not been transferring into the shower since this has started and has been taking sponge baths.)  Equipment Used Wheelchair  Mobility  Mobility Status History of falls  Written Expression  Dominant Hand Right  Vision - History  Baseline Vision Other (comment) (Near sighted. Is supposed to have glasses. Needs to make an appointment.)  Cognition  Overall Cognitive Status Within Functional Limits for tasks assessed  Observation/Other Assessments  Focus on Therapeutic Outcomes (FOTO)  N/A  Sensation  Light Touch Appears Intact  Stereognosis Impaired by gross assessment  Hot/Cold Appears Intact  Proprioception Appears Intact  Additional Comments Pt reports change in  sensation boundaries as: left hand up to elbow. Right hand up to wrist.  Coordination  Gross Motor Movements are Fluid and Coordinated No  Fine Motor Movements are Fluid and Coordinated No  9 Hole Peg Test  (unable to test)  Edema  Edema left hand visually appears to have some mild swelling.  ROM / Strength  AROM / PROM / Strength AROM  AROM  AROM Assessment Site Wrist  Overall AROM Comments assessed seated. Wrist assesed in gravity eliminated plane. Left hand is unable to demonstrate any active movement. Full P/ROM achieved with left hand. Right hand able to extend fingers ~25%. Able to achieve a full fist actively. P/ROM is functiona in the right hand.  Right/Left Wrist Right  Left Wrist Flexion 50 Degrees  Left Wrist Extension 14 Degrees  Left Wrist Radial Deviation -10 Degrees  Left  Wrist Ulnar Deviation 20 Degrees  Right Wrist Flexion 42 Degrees  Right Wrist Extension 40 Degrees  Right Wrist Radial Deviation 18 Degrees  Right Wrist Ulnar Deviation 0 Degrees                         OT Short Term Goals - 05/21/21 1327       OT SHORT TERM GOAL #1   Title Patient will be educated and independent with her HEP to work towards increasing the ROM, strength and  functional use of BUE when completing ADL tasks with assist.    Time 6    Period Weeks    Status New    Target Date 06/28/21      OT SHORT TERM GOAL #2   Title Patient will increase her BUE A/ROM (wrist and forearm) by 10 degrees or more where lacking in order to increase her participation in dressing tasks with assistance from family.    Time 6    Period Weeks    Status New      OT SHORT TERM GOAL #3   Title Patient will complete self feeding with Supervision/set-up assist with the use of adaptive equipment.    Time 6    Period Weeks    Status New      OT SHORT TERM GOAL #4   Title Patient will be educated and verbalize understanding of DME, adaptive equipment, and/or compensatory techniques that will allow her to participate in basic ADL tasks, such as bathing, grooming, and dressing with maximum assistance or less.    Time 6    Period Weeks    Status New                      Plan - 05/21/21 1324     Clinical Impression Statement A: Patient is a 61 y/o female experiencing neuropathy as a result of chemotherapy treatments started in September 2022. The neuropathy has caused impaired sensation, increased pain, increased tone, decreased ROM, strength, and coordination resulting in needing total assistance to complete all ADL tasks.    OT Occupational Profile and History Detailed Assessment- Review of Records and additional review of physical, cognitive, psychosocial history related to current functional performance    Occupational performance deficits (Please refer to  evaluation for details): ADL's;IADL's;Leisure;Work    Marketing executive / Function / Physical Skills ADL;UE functional use;Balance;Pain;FMC;ROM;Coordination;GMC;Sensation;Edema;Mobility;Tone;IADL;Strength    Rehab Potential Good    Clinical Decision Making Multiple treatment options, significant modification of task necessary    Comorbidities Affecting Occupational Performance: Presence of comorbidities impacting occupational performance    Comorbidities  impacting occupational performance description: active cancer, severe neuropathy    Modification or Assistance to Complete Evaluation  Min-Moderate modification of tasks or assist with assess necessary to complete eval    OT Frequency 1x / week    OT Duration 6 weeks    OT Treatment/Interventions Self-care/ADL training;Ultrasound;Energy conservation;Patient/family education;DME and/or AE instruction;Passive range of motion;Paraffin;Balance training;Cryotherapy;Electrical Stimulation;Moist Heat;Therapeutic exercise;Neuromuscular education;Therapeutic activities;Manual Therapy;Splinting;Coping strategies training    Consulted and Agree with Plan of Care Patient           PLAN: patient will benefit from skilled OT services to increase her functional performance during ADL tasks and allow her to complete required ADL and leisure tasks with less need for physical assistance.   Patient will benefit from skilled therapeutic intervention in order to improve the following deficits and impairments:   Body Structure / Function / Physical Skills: ADL, UE functional use, Balance, Pain, FMC, ROM, Coordination, GMC, Sensation, Edema, Mobility, Tone, IADL, Strength       Visit Diagnosis: Other symptoms and signs involving the musculoskeletal system  Other lack of coordination  Other disturbances of skin sensation    Problem List Patient Active Problem List   Diagnosis Date Noted   Dysuria 05/08/2021   Drug-induced neutropenia ([place]) 05/07/2021    Port-A-Cath in place 01/01/2021   Endometrial cancer ([place]) 11/15/2020   PMB (postmenopausal bleeding)    Thickened endometrium    Endometrial polyp    Left ear impacted cerumen 10/09/2020   Unilateral primary osteoarthritis, right hip 12/06/2019   Right hip pain 10/30/2019   Encounter for screening for malignant neoplasm of colon 10/30/2019   Encounter for general adult medical examination with abnormal findings 10/30/2019   Essential hypertension 10/30/2019   Chronic obstructive pulmonary disease ([place]) 10/28/2019   Prediabetes 01/20/2017   Osteoarthritis of right knee 01/20/2017   Morbid obesity ([place]) 06/25/2015    Ailene Ravel, OTR/L,CBIS  [id number]  05/21/2021, 1:54 PM  [place], [place], [address] Phone: [phone]   Fax:  [phone]  Name: Michele Romero MRN: [id number] Date of Birth: 08-03-1960

## 2021-05-21 NOTE — Telephone Encounter (Signed)
CALLED PATIENT'S DAUGHTER - CRYSTAL Luscher TO REMIND OF HDR [employer] 05-22-21 @ 9 AM, SPOKE WITH PATIENT'S DAUGHTER- CRYSTAL Besser AND SHE IS AWARE OF THIS APPT.

## 2021-05-22 ENCOUNTER — Ambulatory Visit (HOSPITAL_COMMUNITY): Payer: PRIVATE HEALTH INSURANCE | Admitting: Hematology

## 2021-05-22 ENCOUNTER — Ambulatory Visit (HOSPITAL_COMMUNITY): Payer: PRIVATE HEALTH INSURANCE

## 2021-05-22 ENCOUNTER — Ambulatory Visit (HOSPITAL_COMMUNITY): Payer: 59

## 2021-05-22 ENCOUNTER — Ambulatory Visit
Admission: RE | Admit: 2021-05-22 | Discharge: 2021-05-22 | Disposition: A | Discharge status: Home / Self Care | Payer: 59 | Source: Ambulatory Visit | Attending: Radiation Oncology | Admitting: Radiation Oncology

## 2021-05-22 ENCOUNTER — Other Ambulatory Visit: Payer: Self-pay

## 2021-05-22 ENCOUNTER — Other Ambulatory Visit (HOSPITAL_COMMUNITY): Payer: PRIVATE HEALTH INSURANCE

## 2021-05-22 DIAGNOSIS — C541 Malignant neoplasm of endometrium: Secondary | ICD-10-CM | POA: Insufficient documentation

## 2021-05-24 ENCOUNTER — Ambulatory Visit (HOSPITAL_COMMUNITY): Payer: PRIVATE HEALTH INSURANCE

## 2021-05-27 ENCOUNTER — Telehealth: Payer: Self-pay | Admitting: *Deleted

## 2021-05-27 NOTE — Telephone Encounter (Signed)
CALLED PATIENT'S DAUGHTER - CRYSTAL Cawthorn TO REMIND OF MOM'S TX. FOR 05-28-21  @1  PM, SPOKE WITH DAUGHTER- CRYSTAL Awtrey AND SHE IS AWARE OF THIS [place].

## 2021-05-27 NOTE — Telephone Encounter (Signed)
XXXX 

## 2021-05-27 NOTE — Progress Notes (Signed)
°  Radiation Oncology         [phone] ________________________________  Name: Michele Romero MRN: [id number]  Date: 05/28/2021  DOB: 21-Jun-1960  CC: [name], NP  [name], NP  HDR BRACHYTHERAPY NOTE  DIAGNOSIS: The encounter diagnosis was Endometrial cancer [facility]).   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   Simple treatment device note: Patient had construction of her custom vaginal cylinder. She will be treated with a 2.5 cm diameter segmented cylinder. This conforms to her anatomy without undue discomfort.  Vaginal brachytherapy procedure node: The patient was brought to the [place] suite. Identity was confirmed. All relevant records and images related to the planned course of therapy were reviewed. The patient freely provided informed written consent to proceed with treatment after reviewing the details related to the planned course of therapy. The consent form was witnessed and verified by the simulation staff. Then, the patient was set-up in a stable reproducible supine position for radiation therapy. Pelvic exam revealed the vaginal cuff to be intact . The patient's custom vaginal cylinder was placed in the proximal vagina. This was affixed to the CT/MR stabilization plate to prevent slippage. Patient tolerated the placement well.  Verification simulation note:  A fiducial marker was placed within the vaginal cylinder. An AP and lateral film was then obtained through the pelvis area. This documented accurate position of the vaginal cylinder for treatment.  HDR BRACHYTHERAPY TREATMENT  The remote afterloading device was affixed to the vaginal cylinder by catheter. Patient then proceeded to undergo her third high-dose-rate treatment directed at the proximal vagina. The patient was prescribed a dose of 6.0 gray to be delivered to the mucosal surface. Treatment length was 3.5 cm. Patient was treated with 1 channel using 8 dwell positions. Treatment time  was 191.6 seconds. Iridium 192 was the high-dose-rate source for treatment. The patient tolerated the treatment well. After completion of her therapy, a radiation survey was performed documenting return of the iridium source into the GammaMed safe.   PLAN: The patient will return next week for her fourth high-dose-rate treatment.  She reports her vaginal discharge itching has improved with 2 Diflucan but does not feel this is cleared up completely.  On exam today the patient was noticed to have a whitish discharge in the vaginal vault consistent with persistent yeast infection.  She will be given full 7-day course of Diflucan to see if this will cleared up completely.    ________________________________  -----------------------------------  Blair Promise, PhD, MD  This document serves as a record of services personally performed by Gery Pray, MD. It was created on his behalf by Roney Mans, a trained medical scribe. The creation of this record is based on the scribe's personal observations and the provider's statements to them. This document has been checked and approved by the attending provider.

## 2021-05-28 ENCOUNTER — Other Ambulatory Visit: Payer: Self-pay

## 2021-05-28 ENCOUNTER — Ambulatory Visit
Admission: RE | Admit: 2021-05-28 | Discharge: 2021-05-28 | Disposition: A | Discharge status: Home / Self Care | Payer: 59 | Source: Ambulatory Visit | Attending: Radiation Oncology | Admitting: Radiation Oncology

## 2021-05-28 DIAGNOSIS — C541 Malignant neoplasm of endometrium: Secondary | ICD-10-CM | POA: Diagnosis not present

## 2021-05-28 MED ORDER — FLUCONAZOLE 100 MG PO TABS: 100.0000 mg | ORAL_TABLET | Freq: Every day | ORAL | 0 refills | Status: DC

## 2021-05-29 ENCOUNTER — Inpatient Hospital Stay (HOSPITAL_COMMUNITY): Payer: 59

## 2021-05-29 ENCOUNTER — Inpatient Hospital Stay (HOSPITAL_COMMUNITY): Payer: 59 | Attending: Hematology | Admitting: Hematology

## 2021-05-29 ENCOUNTER — Telehealth: Payer: Self-pay | Admitting: *Deleted

## 2021-05-29 ENCOUNTER — Ambulatory Visit (HOSPITAL_COMMUNITY): Payer: 59

## 2021-05-29 ENCOUNTER — Encounter (HOSPITAL_COMMUNITY): Payer: Self-pay | Admitting: Hematology

## 2021-05-29 DIAGNOSIS — R278 Other lack of coordination: Secondary | ICD-10-CM | POA: Insufficient documentation

## 2021-05-29 DIAGNOSIS — G63 Polyneuropathy in diseases classified elsewhere: Secondary | ICD-10-CM | POA: Insufficient documentation

## 2021-05-29 DIAGNOSIS — C541 Malignant neoplasm of endometrium: Secondary | ICD-10-CM | POA: Diagnosis not present

## 2021-05-29 DIAGNOSIS — G620 Drug-induced polyneuropathy: Secondary | ICD-10-CM | POA: Insufficient documentation

## 2021-05-29 DIAGNOSIS — E876 Hypokalemia: Secondary | ICD-10-CM | POA: Insufficient documentation

## 2021-05-29 DIAGNOSIS — R208 Other disturbances of skin sensation: Secondary | ICD-10-CM | POA: Insufficient documentation

## 2021-05-29 DIAGNOSIS — R634 Abnormal weight loss: Secondary | ICD-10-CM

## 2021-05-29 DIAGNOSIS — D649 Anemia, unspecified: Secondary | ICD-10-CM | POA: Insufficient documentation

## 2021-05-29 DIAGNOSIS — Z95828 Presence of other vascular implants and grafts: Secondary | ICD-10-CM

## 2021-05-29 DIAGNOSIS — Z5111 Encounter for antineoplastic chemotherapy: Secondary | ICD-10-CM | POA: Insufficient documentation

## 2021-05-29 DIAGNOSIS — R790 Abnormal level of blood mineral: Secondary | ICD-10-CM

## 2021-05-29 DIAGNOSIS — Z79899 Other long term (current) drug therapy: Secondary | ICD-10-CM | POA: Insufficient documentation

## 2021-05-29 DIAGNOSIS — T451X5A Adverse effect of antineoplastic and immunosuppressive drugs, initial encounter: Secondary | ICD-10-CM | POA: Insufficient documentation

## 2021-05-29 DIAGNOSIS — R29898 Other symptoms and signs involving the musculoskeletal system: Secondary | ICD-10-CM | POA: Insufficient documentation

## 2021-05-29 DIAGNOSIS — C801 Malignant (primary) neoplasm, unspecified: Secondary | ICD-10-CM | POA: Insufficient documentation

## 2021-05-29 DIAGNOSIS — Z87891 Personal history of nicotine dependence: Secondary | ICD-10-CM | POA: Insufficient documentation

## 2021-05-29 LAB — COMPREHENSIVE METABOLIC PANEL
ALT: 15 U/L (ref 0–44)
AST: 17 U/L (ref 15–41)
Albumin: 3.5 g/dL (ref 3.5–5.0)
Alkaline Phosphatase: 72 U/L (ref 38–126)
Anion gap: 10 (ref 5–15)
BUN: 12 mg/dL (ref 8–23)
CO2: 23 mmol/L (ref 22–32)
Calcium: 8.8 mg/dL — ABNORMAL LOW (ref 8.9–10.3)
Chloride: 104 mmol/L (ref 98–111)
Creatinine, Ser: 0.49 mg/dL (ref 0.44–1.00)
GFR, Estimated: >60 mL/min (ref >60)
Glucose, Bld: 111 mg/dL — ABNORMAL HIGH (ref 70–99)
Potassium: 3.5 mmol/L (ref 3.5–5.1)
Sodium: 137 mmol/L (ref 135–145)
Total Bilirubin: 0.3 mg/dL (ref 0.3–1.2)
Total Protein: 6.9 g/dL (ref 6.5–8.1)

## 2021-05-29 LAB — CBC WITH DIFFERENTIAL/PLATELET
Abs Immature Granulocytes: 0.02 10*3/uL (ref 0.00–0.07)
Basophils Absolute: 0 10*3/uL (ref 0.0–0.1)
Basophils Relative: 0 %
Eosinophils Absolute: 0 10*3/uL (ref 0.0–0.5)
Eosinophils Relative: 1 %
HCT: 27.3 % — ABNORMAL LOW (ref 36.0–46.0)
Hemoglobin: 8.9 g/dL — ABNORMAL LOW (ref 12.0–15.0)
Immature Granulocytes: 1 %
Lymphocytes Relative: 51 %
Lymphs Abs: 1.9 10*3/uL (ref 0.7–4.0)
MCH: 31.6 pg (ref 26.0–34.0)
MCHC: 32.6 g/dL (ref 30.0–36.0)
MCV: 96.8 fL (ref 80.0–100.0)
Monocytes Absolute: 0.5 10*3/uL (ref 0.1–1.0)
Monocytes Relative: 13 %
Neutro Abs: 1.3 10*3/uL — ABNORMAL LOW (ref 1.7–7.7)
Neutrophils Relative %: 34 %
Platelets: 58 10*3/uL — ABNORMAL LOW (ref 150–400)
RBC: 2.82 MIL/uL — ABNORMAL LOW (ref 3.87–5.11)
RDW: 16.6 % — ABNORMAL HIGH (ref 11.5–15.5)
WBC: 3.7 10*3/uL — ABNORMAL LOW (ref 4.0–10.5)
nRBC: 0 % (ref 0.0–0.2)

## 2021-05-29 LAB — MAGNESIUM: Magnesium: 1.5 mg/dL — ABNORMAL LOW (ref 1.7–2.4)

## 2021-05-29 MED ORDER — SODIUM CHLORIDE 0.9% FLUSH
10.0000 mL | INTRAVENOUS | Status: DC | PRN
  Administered 2021-05-29: 10 mL via INTRAVENOUS

## 2021-05-29 MED ORDER — GABAPENTIN 300 MG PO CAPS: 300.0000 mg | ORAL_CAPSULE | Freq: Three times a day (TID) | ORAL | 3 refills | Status: DC

## 2021-05-29 MED ORDER — SODIUM CHLORIDE 0.9 % IV SOLN: INTRAVENOUS | Status: DC

## 2021-05-29 MED ORDER — MAGNESIUM SULFATE 2 GM/50ML IV SOLN
2.0000 g | Freq: Once | INTRAVENOUS | Status: AC
  Administered 2021-05-29: 2 g via INTRAVENOUS
  Filled 2021-05-29: qty 50

## 2021-05-29 MED ORDER — HEPARIN SOD (PORK) LOCK FLUSH 100 UNIT/ML IV SOLN
500.0000 [IU] | Freq: Once | INTRAVENOUS | Status: AC
  Administered 2021-05-29: 500 [IU] via INTRAVENOUS

## 2021-05-29 NOTE — Progress Notes (Signed)
No treatment today , platelets are 58. Will hold treatment today per MD. Will give Magnesium per orders.   Patient tolerated it well without problems. Vitals stable and discharged home from clinic via wheelchair. Follow up as scheduled.

## 2021-05-29 NOTE — Progress Notes (Signed)
[place], [place] [address]   CLINIC:  Medical Oncology/Hematology  PCP:  [name], NP None None   REASON FOR VISIT:  Follow-up for endometrial cancer  PRIOR THERAPY: none  NGS Results: not done  CURRENT THERAPY: Carboplatin and Taxol every 3 weeks  BRIEF ONCOLOGIC HISTORY:  Oncology History  Endometrial cancer ([place])  11/15/2020 Initial Diagnosis   Endometrial cancer ([place])   01/23/2021 -  Chemotherapy   Patient is on Treatment Plan : UTERINE Carboplatin AUC 6 / Paclitaxel q21d       CANCER STAGING:  Cancer Staging  Endometrial cancer ([place]) Staging form: Corpus Uteri - Carcinoma and Carcinosarcoma, AJCC 8th Edition - Clinical stage from 12/31/2020: FIGO Stage IIIC1 (cT1a, cN55m, cM0) - Unsigned   INTERVAL HISTORY:  Michele Romero a 61y.o. female, returns for routine follow-up and consideration for next cycle of chemotherapy. Michele Romero last seen on 04/24/2021.  Due for cycle #6 of Carboplatin and Taxol today.   Overall, she tells me she has been feeling pretty well. 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 She continues to have numbness in her feet as well, and she is now using a wheelchair as she is unable to hold on to a walker. She is taking Gabapentin BID. She is taking Magnesium TID.   Overall, she feels ready for next cycle of chemo today.   REVIEW OF SYSTEMS:  Review of Systems  Constitutional:  Positive for fatigue. Negative for appetite change.  Neurological:  Positive for numbness.  All other systems reviewed and are negative.  PAST MEDICAL/SURGICAL HISTORY:  Past Medical History:  Diagnosis Date   Ambulates with cane 10/31/2020   Arthritis    RIGHT HIP   Asthma    Cigarette nicotine dependence without complication 058/12/9831  Complex tear of medial meniscus of right knee 01/20/2017   COPD (chronic obstructive pulmonary disease) ([place]    ENDOMETRIAL  CANCER 10/31/2020   [place]ometrial cancer [place] 11/15/2020   GERD (gastroesophageal reflux disease)    Migraines    Port-A-Cath in place 01/01/2021   Past Surgical History:  Procedure Laterality Date   APPENDECTOMY     YRS AGO PER PT ON 10-31-2020   BIOPSY  11/29/2019   Procedure: BIOPSY;  Surgeon: CHarvel Quale MD;  [facility];  Service: Gastroenterology;;  ileocecal vlve   CHOLECYSTECTOMY     YRS AGO PER PT ON 10-31-2020   COLONOSCOPY WITH PROPOFOL N/A 11/29/2019   Procedure: COLONOSCOPY WITH PROPOFOL;  Surgeon: CHarvel Quale MD;  Location: [facility];  Service: Gastroenterology;  Laterality: N/A;  930   HYSTEROSCOPY WITH D & C N/A 10/10/2020   Procedure: DILATATION AND CURETTAGE /HYSTEROSCOPY;  Surgeon: EFlorian Buff MD;  Location: [facility];  Service: Gynecology;  Laterality: N/A;   IR IMAGING GUIDED PORT INSERTION  01/08/2021   KNEE ARTHROPLASTY  2018   left knee   POLYPECTOMY  11/29/2019   Procedure: POLYPECTOMY;  Surgeon: CMontez Morita DQuillian Quince MD;  Location: [facility];  Service: Gastroenterology;;  sigmoid colon    ROBOTIC ASSISTED TOTAL HYSTERECTOMY WITH BILATERAL SALPINGO OOPHERECTOMY N/A 11/15/2020   Procedure: XI ROBOTIC ASSISTED TOTAL HYSTERECTOMY GREATER THAN TWO HUNDRED AND FIFTY GRAMS WITH BILATERAL SALPINGO OOPHORECTOMY, MINI LAPAROTOMY;  Surgeon: REveritt Amber MD;  Location: [facility]  Service: Gynecology;  Laterality: N/A;   SENTINEL NODE BIOPSY N/A 11/15/2020  Procedure: SENTINEL NODE BIOPSY;  Surgeon: Everitt Amber, MD;  Location: [facility];  Service: Gynecology;  Laterality: N/A;   TUBAL LIGATION  04/14/1985    SOCIAL HISTORY:  Social History   Socioeconomic History   Marital status: Single    Spouse name: Not on file   Number of children: 2   Years of education: Not on file   Highest education level: High school graduate  Occupational History   Not on file  Tobacco Use   Smoking  status: Former    Packs/day: 0.50    Years: 30.00    Pack years: 15.00    Types: Cigarettes    Quit date: 01/01/2020    Years since quitting: 1.4    Passive exposure: Never   Smokeless tobacco: Never  Vaping Use   Vaping Use: Never used  Substance and Sexual Activity   Alcohol use: No   Drug use: Yes    Frequency: 6.0 times per week    Types: Marijuana    Comment: every now and then   Sexual activity: Not Currently    Birth control/protection: Surgical    Comment: tubal  Other Topics Concern   Not on file  Social History Narrative   Lives with daughter and 1 grandbaby   Son is in [place]      Enjoys: sitting outside, music, tv      Diet: eats all food groups   Caffeine: soda and tea: 2-3 cups daily   Water: 2-3 16 oz bottles      Wears seat belt    Does not use phone while driving   Oceanographer at home    No weapons    Social Determinants of Health   Financial Resource Strain: High Risk   Difficulty of Paying Living Expenses: Hard  Food Insecurity: No Food Insecurity   Worried About Charity fundraiser in the Last Year: Never true   Arboriculturist in the Last Year: Never true  Transportation Needs: Unmet Transportation Needs   Lack of Transportation (Medical): Yes   Lack of Transportation (Non-Medical): Yes  Physical Activity: Not on file  Stress: Stress Concern Present   Feeling of Stress : To some extent  Social Connections: Unknown   Frequency of Communication with Friends and Family: More than three times a week   Frequency of Social Gatherings with Friends and Family: More than three times a week   Attends Religious Services: More than 4 times per year   Active Member of [employer] or Organizations: Not on file   Attends Music therapist: Not on file   Marital Status: Not on file  Intimate Partner Violence: Not on file    FAMILY HISTORY:  Family History  Problem Relation Age of Onset   Diabetes Mother    Hypertension Mother    Heart  disease Mother        CHF   Stroke Mother    Heart disease Father    Colon cancer Brother    Breast cancer Neg Hx    Ovarian cancer Neg Hx    Endometrial cancer Neg Hx    Prostate cancer Neg Hx    Pancreatic cancer Neg Hx     CURRENT MEDICATIONS:  Current Outpatient Medications  Medication Sig Dispense Refill   albuterol (VENTOLIN HFA) 108 (90 Base) MCG/ACT inhaler Inhale 2 puffs into the lungs every 6 (six) hours as needed for wheezing or shortness of breath. 8 g 2  budesonide-formoterol (SYMBICORT) 80-4.5 MCG/ACT inhaler Inhale 2 puffs into the lungs 2 (two) times daily. 10.2 g 2   calcium carbonate (TUMS - DOSED IN MG ELEMENTAL CALCIUM) 500 MG chewable tablet Chew 1 tablet by mouth as needed for indigestion or heartburn.     CARBOPLATIN IV Inject into the vein every 21 ( twenty-one) days.     fluconazole (DIFLUCAN) 100 MG tablet Take 1 tablet (100 mg total) by mouth daily. 7 tablet 0   fluconazole (DIFLUCAN) 150 MG tablet Take 1 tablet (150 mg total) by mouth daily. Take 2nd tablet 72 hours later 2 tablet 0   gabapentin (NEURONTIN) 300 MG capsule Take 1 capsule (300 mg total) by mouth 2 (two) times daily. 60 capsule 1   ibuprofen (ADVIL) 800 MG tablet Take 1 tablet (800 mg total) by mouth every 8 (eight) hours as needed for moderate pain. For AFTER surgery only 30 tablet 0   lidocaine-prilocaine (EMLA) cream Apply small amount to port a cath site and cover with plastic wrap 1 hour prior to chemotherapy appointments 30 g 3   loratadine (CLARITIN) 10 MG tablet Take 1 tablet (10 mg total) by mouth daily. 30 tablet 2   magnesium oxide (MAG-OX) 400 (240 Mg) MG tablet Take 1 tablet (400 mg total) by mouth in the morning, at noon, and at bedtime. 90 tablet 3   Multiple Vitamin (MULTIVITAMIN WITH MINERALS) TABS tablet Take 1 tablet by mouth daily.     ondansetron (ZOFRAN ODT) 8 MG disintegrating tablet Take 1 tablet (8 mg total) by mouth every 8 (eight) hours as needed for nausea or vomiting.  8 tablet 0   PACLITAXEL IV Inject into the vein every 21 ( twenty-one) days.     pantoprazole (PROTONIX) 40 MG tablet Take 1 tablet (40 mg total) by mouth daily. 30 tablet 1   Pegfilgrastim-cbqv (UDENYCA [place]) Inject into the skin every 21 ( twenty-one) days. Administer on Day 3 of each chemotherapy cycle     potassium chloride SA (KLOR-CON M) 20 MEQ tablet Take 1 tablet (20 mEq total) by mouth daily. 30 tablet 3   prochlorperazine (COMPAZINE) 10 MG tablet Take 1 tablet (10 mg total) by mouth every 6 (six) hours as needed for nausea or vomiting. 30 tablet 3   senna-docusate (SENOKOT-S) 8.6-50 MG tablet Take 2 tablets by mouth at bedtime. For AFTER surgery, do not take if having diarrhea 30 tablet 0   traMADol (ULTRAM) 50 MG tablet Take 1 tablet (50 mg total) by mouth every 6 (six) hours as needed for severe pain. For AFTER surgery only, do not take and drive 10 tablet 0   vitamin B-12 (CYANOCOBALAMIN) 500 MCG tablet Take 500 mcg by mouth daily.     No current facility-administered medications for this visit.    ALLERGIES:  Allergies  Allergen Reactions   Codeine Nausea And Vomiting and Rash    PHYSICAL EXAM:  Performance status (ECOG): 1 - Symptomatic but completely ambulatory  There were no vitals filed for this visit. Wt Readings from Last 3 Encounters:  05/29/21 180 lb 3.2 oz (81.7 kg)  05/09/21 184 lb (83.5 kg)  05/08/21 186 lb 12.8 oz (84.7 kg)   Physical Exam Vitals reviewed.  Constitutional:      Appearance: Normal appearance. She is obese.  Cardiovascular:     Rate and Rhythm: Normal rate and regular rhythm.     Pulses: Normal pulses.     Heart sounds: Normal heart sounds.  Pulmonary:  Effort: Pulmonary effort is normal.  °   Breath sounds: Normal breath sounds.  °Neurological:  °   General: No focal deficit present.  °   Mental Status: She is alert and oriented to person, place, and time.  °Psychiatric:     °   Mood and Affect: Mood normal.     °   Behavior: Behavior  normal.  ° ° °LABORATORY DATA:  °I have reviewed the labs as listed.  °CBC Latest Ref Rng & Units 05/09/2021 05/07/2021 05/01/2021  °WBC 4.0 - 10.5 K/uL 7.0 2.7(L) 4.3  °Hemoglobin 12.0 - 15.0 g/dL 9.2(L) 9.5(L) 9.1(L)  °Hematocrit 36.0 - 46.0 % 28.8(L) 29.5(L) 28.2(L)  °Platelets 150 - 400 K/uL 178 164 71(L)  ° °CMP Latest Ref Rng & Units 05/07/2021 05/01/2021 04/24/2021  °Glucose 70 - 99 mg/dL 120(H) 132(H) 121(H)  °BUN 8 - 23 mg/dL 12 12 12  °Creatinine 0.44 - 1.00 mg/dL 0.58 0.57 0.52  °Sodium 135 - 145 mmol/L 139 137 140  °Potassium 3.5 - 5.1 mmol/L 3.7 3.7 3.5  °Chloride 98 - 111 mmol/L 103 103 106  °CO2 22 - 32 mmol/L 26 25 24  °Calcium 8.9 - 10.3 mg/dL 9.3 8.8(L) 8.7(L)  °Total Protein 6.5 - 8.1 g/dL 7.3 7.1 6.7  °Total Bilirubin 0.3 - 1.2 mg/dL 0.2(L) 0.7 0.6  °Alkaline Phos 38 - 126 U/L 66 62 68  °AST 15 - 41 U/L 16 13(L) 17  °ALT 0 - 44 U/L 10 9 14  ° ° °DIAGNOSTIC IMAGING:  °I have independently reviewed the scans and discussed with the patient. °No results found.  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 °- Endometrial biopsy on 10/10/2020 consistent with carcinosarcoma, MMR preserved. °- CT scan of the abdomen and pelvis with contrast on 10/29/2020 with endometrial mass, no compelling findings of metastatic disease in the abdomen or pelvis.  30 cm³ fatty lesion intraluminally in the distal transverse colon probably lipoma. °- Robotic assisted laparoscopic total hysterectomy with bilateral salpingo-oophorectomy and sentinel lymph node biopsy by Dr. Rossi on 11/15/2020. °- Pathology consistent with carcinosarcoma spanning 8.5 cm, tumor invades less than one half of myometrium, LVI positive, 1 lymph node involved with micrometastasis, PT1APN1 MI, MMR preserved, MSI-stable °- She was evaluated by Dr. Rossi and 6 cycles of adjuvant chemotherapy with carboplatin and paclitaxel with vaginal brachytherapy was recommended. °- Cycle 1  of carboplatin and paclitaxel on 01/23/2021. ° °2.  Social/family history: °- Lives at home with her daughter and granddaughter.  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  She quit smoking in September 2021.  Half pack per day smoker for 30 years.  She has severe right hip arthritis, requiring her to use a cane for ambulation. °- No family history of malignancies. ° ° °PLAN:  °Stage III C1 (PT1APN1A) endometrial carcinosarcoma: °- She did not have any major problems after cycle 5 single agent carboplatin. °- Denied any GI side effects. °- Reviewed labs today which showed normal LFTs and creatinine.  However white count is 3.7 with ANC of 1300.  Platelet count is also low at 58. °- She started HDR brachytherapy treatment at [facility]. °- I have recommended holding her treatment today.  We will check her labs in 1 week.  If her ANC normalizes and platelet count at least more than 75-100 K, will proceed with cycle 6. °- This will conclude her   adjuvant chemotherapy.  We will see her back 1 month after her last treatment with repeat CT of the abdomen and pelvis and CA125 level. - We will also make her follow-up visit with Dr. Berline Lopes.  2.  Normocytic anemia: - Hemoglobin is 8.9 with MCV 96.  Anemia from myelosuppression.  3.  Hypomagnesemia: - She is taking magnesium 3 times daily.  Magnesium today is 1.5.  She will receive 2 g of magnesium.  4.  Hypokalemia: - Continue potassium 20 mEq daily.  Potassium is normal today.  5.  Peripheral neuropathy: - She has developed severe neuropathy with contractures in the right hand more than left hand with paclitaxel.  This happened despite dose reduction of paclitaxel and discontinuation completely during cycle 5. - We have made a referral to Occupational Therapy.  In my experience, function of her hands is unlikely to completely recover.  She will benefit from motorized wheelchair and other help. - I will increase  her gabapentin to 300 mg-300 mg - 600 mg daily.   Orders placed this encounter:  No orders of the defined types were placed in this encounter.    Derek Jack, MD [place] [phone]   I, Thana Ates, am acting as a scribe for Dr. Derek Jack.  I, Derek Jack MD, have reviewed the above documentation for accuracy and completeness, and I agree with the above.

## 2021-05-29 NOTE — Telephone Encounter (Signed)
CALLED PATIENT'S DAUGHTER- CRYSTAL Prothero TO REMIND OF [facility] [place] 05-30-21 @ 1 PM, SPOKE WITH PATIENT'S DAUGHTER- CRYSTAL AND SHE IS AWARE OF THIS [place].

## 2021-05-29 NOTE — Patient Instructions (Signed)
[facility]  Discharge Instructions: Thank you for choosing [facility] to provide your oncology and hematology care.  If you have a lab appointment with the Cancer Center, please come in thru the Main Entrance and check in at the main information desk.  Wear comfortable clothing and clothing appropriate for easy access to any Portacath or PICC line.   We strive to give you quality time with your provider. You may need to reschedule your appointment if you arrive late (15 or more minutes).  Arriving late affects you and other patients whose appointments are after yours.  Also, if you miss three or more appointments without notifying the office, you may be dismissed from the clinic at the provider's discretion.      For prescription refill requests, have your pharmacy contact our office and allow 72 hours for refills to be completed.    Today you received the following chemotherapy and/or immunotherapy agents    To help prevent nausea and vomiting after your treatment, we encourage you to take your nausea medication as directed.  BELOW ARE SYMPTOMS THAT SHOULD BE REPORTED IMMEDIATELY: . *FEVER GREATER THAN 100.4 F (38 C) OR HIGHER . *CHILLS OR SWEATING . *NAUSEA AND VOMITING THAT IS NOT CONTROLLED WITH YOUR NAUSEA MEDICATION . *UNUSUAL SHORTNESS OF BREATH . *UNUSUAL BRUISING OR BLEEDING . *URINARY PROBLEMS (pain or burning when urinating, or frequent urination) . *BOWEL PROBLEMS (unusual diarrhea, constipation, pain near the anus) . TENDERNESS IN MOUTH AND THROAT WITH OR WITHOUT PRESENCE OF ULCERS (sore throat, sores in mouth, or a toothache) . UNUSUAL RASH, SWELLING OR PAIN  . UNUSUAL VAGINAL DISCHARGE OR ITCHING   Items with * indicate a potential emergency and should be followed up as soon as possible or go to the Emergency Department if any problems should occur.  Please show the CHEMOTHERAPY ALERT CARD or IMMUNOTHERAPY ALERT CARD at check-in to the  Emergency Department and triage nurse.  Should you have questions after your visit or need to cancel or reschedule your appointment, please contact [facility] [phone]  and follow the prompts.  Office hours are 8:00 a.m. to 4:30 p.m. Monday - Friday. Please note that voicemails left after 4:00 p.m. may not be returned until the following business day.  We are closed weekends and major holidays. You have access to a nurse at all times for urgent questions. Please call the main number to the clinic [phone] and follow the prompts.  For any non-urgent questions, you may also contact your provider using MyChart. We now offer e-Visits for anyone 18 and older to request care online for non-urgent symptoms. For details visit [email or web address].   Also download the MyChart app! Go to the app store, search "MyChart", open the app, select [facility], and log in with your MyChart username and password.  Due to Covid, a mask is required upon entering the hospital/clinic. If you do not have a mask, one will be given to you upon arrival. For doctor visits, patients may have 1 support person aged 18 or older with them. For treatment visits, patients cannot have anyone with them due to current Covid guidelines and our immunocompromised population.  

## 2021-05-29 NOTE — Progress Notes (Signed)
°  Radiation Oncology         [phone] ________________________________  Name: Michele Romero MRN: [id number]  Date: 05/30/2021  DOB: 1960-09-21  CC: [name], NP  [name], NP  HDR BRACHYTHERAPY NOTE  DIAGNOSIS: The encounter diagnosis was Endometrial cancer [facility]).   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   Simple treatment device note: Patient had construction of her custom vaginal cylinder. She will be treated with a 2.5 cm diameter segmented cylinder. This conforms to her anatomy without undue discomfort.  Vaginal brachytherapy procedure node: The patient was brought to the [place] suite. Identity was confirmed. All relevant records and images related to the planned course of therapy were reviewed. The patient freely provided informed written consent to proceed with treatment after reviewing the details related to the planned course of therapy. The consent form was witnessed and verified by the simulation staff. Then, the patient was set-up in a stable reproducible supine position for radiation therapy. Pelvic exam revealed the vaginal cuff to be intact . The patient's custom vaginal cylinder was placed in the proximal vagina. This was affixed to the CT/MR stabilization plate to prevent slippage. Patient tolerated the placement well.  Verification simulation note:  A fiducial marker was placed within the vaginal cylinder. An AP and lateral film was then obtained through the pelvis area. This documented accurate position of the vaginal cylinder for treatment.  HDR BRACHYTHERAPY TREATMENT  The remote afterloading device was affixed to the vaginal cylinder by catheter. Patient then proceeded to undergo her fourth high-dose-rate treatment directed at the proximal vagina. The patient was prescribed a dose of 6.0 gray to be delivered to the mucosal surface. Treatment length was 3.5 cm. Patient was treated with 1 channel using 8 dwell positions. Treatment time  was 195.1 seconds. Iridium 192 was the high-dose-rate source for treatment. The patient tolerated the treatment well. After completion of her therapy, a radiation survey was performed documenting return of the iridium source into the GammaMed safe.   PLAN: The patient will return next week for her fifth high-dose-rate treatment.   ________________________________  -----------------------------------  Blair Promise, PhD, MD  This document serves as a record of services personally performed by Gery Pray, MD. It was created on his behalf by Roney Mans, a trained medical scribe. The creation of this record is based on the scribe's personal observations and the provider's statements to them. This document has been checked and approved by the attending provider.

## 2021-05-29 NOTE — Progress Notes (Signed)
Patients port flushed without difficulty.  Good blood return noted with no bruising or swelling noted at site.  Stable during access and blood draw.  Patient to remain accessed for treatment. 

## 2021-05-29 NOTE — Patient Instructions (Addendum)
[place] [facility] Discharge Instructions   You were seen and examined today by Dr. Delton Coombes.  He reviewed your lab work which is normal/stable.   We will hold your treatment today due to your platelet count being low.  Increase the gabapentin to three times a day - one in the morning, one in the afternoon, and two pills at bedtime to help with neuropathy.  We will have you return next week for lab recheck and treatment.     Thank you for choosing [place] at [facility] to provide your oncology and hematology care.  To afford each patient quality time with our provider, please arrive at least 15 minutes before your scheduled appointment time.   If you have a lab appointment with the [facility] please come in thru the Main Entrance and check in at the main information desk.  You need to re-schedule your appointment should you arrive 10 or more minutes late.  We strive to give you quality time with our providers, and arriving late affects you and other patients whose appointments are after yours.  Also, if you no show three or more times for appointments you may be dismissed from the clinic at the providers discretion.     Again, thank you for choosing [facility].  Our hope is that these requests will decrease the amount of time that you wait before being seen by our physicians.       _____________________________________________________________  Should you have questions after your visit to [facility], please contact our office at [phone] and follow the prompts.  Our office hours are 8:00 a.m. and 4:30 p.m. Monday - Friday.  Please note that voicemails left after 4:00 p.m. may not be returned until the following business day.  We are closed weekends and major holidays.  You do have access to a nurse 24-7, just call the main number to the clinic [phone] and do not press any options, hold on the  line and a nurse will answer the phone.    For prescription refill requests, have your pharmacy contact our office and allow 72 hours.    Due to Covid, you will need to wear a mask upon entering the hospital. If you do not have a mask, a mask will be given to you at the Main Entrance upon arrival. For doctor visits, patients may have 1 support person age 57 or older with them. For treatment visits, patients can not have anyone with them due to social distancing guidelines and our immunocompromised population.

## 2021-05-30 ENCOUNTER — Ambulatory Visit
Admission: RE | Admit: 2021-05-30 | Discharge: 2021-05-30 | Disposition: A | Discharge status: Home / Self Care | Payer: 59 | Source: Ambulatory Visit | Attending: Radiation Oncology | Admitting: Radiation Oncology

## 2021-05-30 DIAGNOSIS — C541 Malignant neoplasm of endometrium: Secondary | ICD-10-CM | POA: Diagnosis not present

## 2021-05-31 ENCOUNTER — Telehealth: Payer: Self-pay | Admitting: *Deleted

## 2021-05-31 ENCOUNTER — Ambulatory Visit (HOSPITAL_COMMUNITY): Payer: PRIVATE HEALTH INSURANCE

## 2021-05-31 NOTE — Telephone Encounter (Signed)
CALLED PATIENT'S DAUGHTER- CRYSTAL TO REMIND OF HDR TX. FOR 06-03-21 @ 1 PM, SPOKE WITH PATIENT'S DAUGHTER- CRYSTAL AND SHE IS AWARE OF THIS [place].

## 2021-06-02 NOTE — Progress Notes (Signed)
°  Radiation Oncology         [phone] ________________________________  Name: Michele Romero MRN: [id number]  Date: 06/03/2021  DOB: 03/13/1961  CC: [name], NP  [name], NP  HDR BRACHYTHERAPY NOTE  DIAGNOSIS: The encounter diagnosis was Endometrial cancer [facility]).   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   Simple treatment device note: Patient had construction of her custom vaginal cylinder. She will be treated with a 2.5 cm diameter segmented cylinder. This conforms to her anatomy without undue discomfort.  Vaginal brachytherapy procedure node: The patient was brought to the [facility] suite. Identity was confirmed. All relevant records and images related to the planned course of therapy were reviewed. The patient freely provided informed written consent to proceed with treatment after reviewing the details related to the planned course of therapy. The consent form was witnessed and verified by the simulation staff. Then, the patient was set-up in a stable reproducible supine position for radiation therapy. Pelvic exam revealed the vaginal cuff to be intact . The patient's custom vaginal cylinder was placed in the proximal vagina. This was affixed to the CT/MR stabilization plate to prevent slippage. Patient tolerated the placement well.  Verification simulation note:  A fiducial marker was placed within the vaginal cylinder. An AP and lateral film was then obtained through the pelvis area. This documented accurate position of the vaginal cylinder for treatment.  HDR BRACHYTHERAPY TREATMENT  The remote afterloading device was affixed to the vaginal cylinder by catheter. Patient then proceeded to undergo her fifth high-dose-rate treatment directed at the proximal vagina. The patient was prescribed a dose of 6.0 gray to be delivered to the mucosal surface. Treatment length was 3.5 cm. Patient was treated with 1 channel using 8 dwell positions. Treatment time  was 202.4 seconds. Iridium 192 was the high-dose-rate source for treatment. The patient tolerated the treatment well. After completion of her therapy, a radiation survey was performed documenting return of the iridium source into the GammaMed safe.   PLAN: The patient has completed her fifth and final high-dose-rate treatment. She will return for routine follow-up in one month, or sooner if needed.  Overall she tolerated her brachytherapy well.  She was found to have a vulvovaginal yeast infection and was treated with Diflucan for this issue. ________________________________  -----------------------------------  Blair Promise, PhD, MD  This document serves as a record of services personally performed by Gery Pray, MD. It was created on his behalf by Roney Mans, a trained medical scribe. The creation of this record is based on the scribe's personal observations and the provider's statements to them. This document has been checked and approved by the attending provider.

## 2021-06-03 ENCOUNTER — Encounter: Payer: Self-pay | Admitting: Radiation Oncology

## 2021-06-03 ENCOUNTER — Other Ambulatory Visit: Payer: Self-pay

## 2021-06-03 ENCOUNTER — Ambulatory Visit
Admission: RE | Admit: 2021-06-03 | Discharge: 2021-06-03 | Disposition: A | Discharge status: Home / Self Care | Payer: 59 | Source: Ambulatory Visit | Attending: Radiation Oncology | Admitting: Radiation Oncology

## 2021-06-03 DIAGNOSIS — C541 Malignant neoplasm of endometrium: Secondary | ICD-10-CM

## 2021-06-04 ENCOUNTER — Telehealth (HOSPITAL_COMMUNITY): Payer: Self-pay

## 2021-06-04 ENCOUNTER — Ambulatory Visit (HOSPITAL_COMMUNITY): Payer: 59

## 2021-06-04 NOTE — Telephone Encounter (Signed)
Called and spoke to patient regarding 2 no shows. Patient reports that last week she did not have a ride and today she was having GI issues. Requested that patient call or have someone call to cancel and reschedule an appointment if possible versus a no show. Pt verbalized understanding.  Patient was reminded of upcoming appointment next week and to call if unable to make it.   Ailene Ravel, OTR/L,CBIS  [phone]

## 2021-06-05 ENCOUNTER — Other Ambulatory Visit (HOSPITAL_COMMUNITY): Payer: PRIVATE HEALTH INSURANCE

## 2021-06-05 ENCOUNTER — Inpatient Hospital Stay (HOSPITAL_COMMUNITY): Payer: 59

## 2021-06-05 ENCOUNTER — Ambulatory Visit (HOSPITAL_COMMUNITY): Payer: PRIVATE HEALTH INSURANCE | Admitting: Hematology

## 2021-06-05 ENCOUNTER — Ambulatory Visit (HOSPITAL_COMMUNITY): Payer: PRIVATE HEALTH INSURANCE

## 2021-06-07 ENCOUNTER — Ambulatory Visit (HOSPITAL_COMMUNITY): Payer: 59

## 2021-06-07 ENCOUNTER — Ambulatory Visit (HOSPITAL_COMMUNITY): Payer: PRIVATE HEALTH INSURANCE

## 2021-06-10 ENCOUNTER — Other Ambulatory Visit: Payer: Self-pay

## 2021-06-10 ENCOUNTER — Inpatient Hospital Stay (HOSPITAL_COMMUNITY): Payer: 59

## 2021-06-10 ENCOUNTER — Inpatient Hospital Stay (HOSPITAL_COMMUNITY): Payer: 59 | Admitting: Dietician

## 2021-06-10 DIAGNOSIS — C541 Malignant neoplasm of endometrium: Secondary | ICD-10-CM

## 2021-06-10 DIAGNOSIS — Z95828 Presence of other vascular implants and grafts: Secondary | ICD-10-CM

## 2021-06-10 DIAGNOSIS — Z5111 Encounter for antineoplastic chemotherapy: Secondary | ICD-10-CM | POA: Diagnosis not present

## 2021-06-10 LAB — COMPREHENSIVE METABOLIC PANEL
ALT: 13 U/L (ref 0–44)
AST: 15 U/L (ref 15–41)
Albumin: 3.5 g/dL (ref 3.5–5.0)
Alkaline Phosphatase: 64 U/L (ref 38–126)
Anion gap: 9 (ref 5–15)
BUN: 12 mg/dL (ref 8–23)
CO2: 23 mmol/L (ref 22–32)
Calcium: 8.7 mg/dL — ABNORMAL LOW (ref 8.9–10.3)
Chloride: 105 mmol/L (ref 98–111)
Creatinine, Ser: 0.46 mg/dL (ref 0.44–1.00)
GFR, Estimated: >60 mL/min (ref >60)
Glucose, Bld: 113 mg/dL — ABNORMAL HIGH (ref 70–99)
Potassium: 3.5 mmol/L (ref 3.5–5.1)
Sodium: 137 mmol/L (ref 135–145)
Total Bilirubin: 0.4 mg/dL (ref 0.3–1.2)
Total Protein: 7.1 g/dL (ref 6.5–8.1)

## 2021-06-10 LAB — CBC WITH DIFFERENTIAL/PLATELET
Abs Immature Granulocytes: 0.01 10*3/uL (ref 0.00–0.07)
Basophils Absolute: 0 10*3/uL (ref 0.0–0.1)
Basophils Relative: 0 %
Eosinophils Absolute: 0.1 10*3/uL (ref 0.0–0.5)
Eosinophils Relative: 2 %
HCT: 27.8 % — ABNORMAL LOW (ref 36.0–46.0)
Hemoglobin: 9.4 g/dL — ABNORMAL LOW (ref 12.0–15.0)
Immature Granulocytes: 0 %
Lymphocytes Relative: 34 %
Lymphs Abs: 1.4 10*3/uL (ref 0.7–4.0)
MCH: 33.9 pg (ref 26.0–34.0)
MCHC: 33.8 g/dL (ref 30.0–36.0)
MCV: 100.4 fL — ABNORMAL HIGH (ref 80.0–100.0)
Monocytes Absolute: 0.3 10*3/uL (ref 0.1–1.0)
Monocytes Relative: 9 %
Neutro Abs: 2.2 10*3/uL (ref 1.7–7.7)
Neutrophils Relative %: 55 %
Platelets: 151 10*3/uL (ref 150–400)
RBC: 2.77 MIL/uL — ABNORMAL LOW (ref 3.87–5.11)
RDW: 17.2 % — ABNORMAL HIGH (ref 11.5–15.5)
WBC: 3.9 10*3/uL — ABNORMAL LOW (ref 4.0–10.5)
nRBC: 0 % (ref 0.0–0.2)

## 2021-06-10 LAB — MAGNESIUM: Magnesium: 1.5 mg/dL — ABNORMAL LOW (ref 1.7–2.4)

## 2021-06-10 MED ORDER — MAGNESIUM SULFATE 2 GM/50ML IV SOLN
2.0000 g | Freq: Once | INTRAVENOUS | Status: AC
  Administered 2021-06-10: 2 g via INTRAVENOUS
  Filled 2021-06-10: qty 50

## 2021-06-10 MED ORDER — SODIUM CHLORIDE 0.9 % IV SOLN: Freq: Once | INTRAVENOUS | Status: AC

## 2021-06-10 MED ORDER — SODIUM CHLORIDE 0.9 % IV SOLN
710.0000 mg | Freq: Once | INTRAVENOUS | Status: AC
  Administered 2021-06-10: 710 mg via INTRAVENOUS
  Filled 2021-06-10: qty 71

## 2021-06-10 MED ORDER — HEPARIN SOD (PORK) LOCK FLUSH 100 UNIT/ML IV SOLN
500.0000 [IU] | Freq: Once | INTRAVENOUS | Status: AC | PRN
  Administered 2021-06-10: 500 [IU]

## 2021-06-10 MED ORDER — SODIUM CHLORIDE 0.9 % IV SOLN
150.0000 mg | Freq: Once | INTRAVENOUS | Status: AC
  Administered 2021-06-10: 150 mg via INTRAVENOUS
  Filled 2021-06-10: qty 150

## 2021-06-10 MED ORDER — FAMOTIDINE IN NACL 20-0.9 MG/50ML-% IV SOLN
20.0000 mg | Freq: Once | INTRAVENOUS | Status: AC
  Administered 2021-06-10: 20 mg via INTRAVENOUS
  Filled 2021-06-10: qty 50

## 2021-06-10 MED ORDER — SODIUM CHLORIDE 0.9 % IV SOLN
10.0000 mg | Freq: Once | INTRAVENOUS | Status: AC
  Administered 2021-06-10: 10 mg via INTRAVENOUS
  Filled 2021-06-10: qty 10

## 2021-06-10 MED ORDER — SODIUM CHLORIDE 0.9% FLUSH
10.0000 mL | INTRAVENOUS | Status: DC | PRN
  Administered 2021-06-10: 10 mL

## 2021-06-10 MED ORDER — PALONOSETRON HCL INJECTION 0.25 MG/5ML
0.2500 mg | Freq: Once | INTRAVENOUS | Status: AC
  Administered 2021-06-10: 0.25 mg via INTRAVENOUS
  Filled 2021-06-10: qty 5

## 2021-06-10 NOTE — Progress Notes (Signed)
Nutrition Assessment   Reason for Assessment: MST   ASSESSMENT: 61 year old female with endometrial cancer. She is receiving carboplatin and taxol q3w. Patient is completed final chemotherapy today. Patient is under the care of Dr. Delton Coombes.   Past medical history includes HTN, COPD, GERD, asthma, arthritis   Met with patient during infusion. She reports feeling tired. Her appetite has been poor. Patient has "good days and bad days." On a good day she typically eats 2 meals and snacks. Patient recalls eggs and bacon for breakfast, pack of nabs or cookies in the afternoon and meat, veggies, potatoes for supper. Patient mostly drinks water, occasionally will have tea or pepsi. Patient drinks Ensure when she can afford them. Patient denies nausea, vomiting, diarrhea. She has intermittent constipation. Patient is taking Senokot as needed.   Nutrition Focused Physical Exam: Moderate fat depletion - orbital, buccal Moderate muscle depletion - temple, clavicle, dorsal hand    Medications: Tums, Gabapentin, Mag-ox, MVI, Zofran, Protonix, Klor-con, Compazine, Senokot, Tramadil, B12   Labs: Glucose 113, Mg 1.5   Anthropometrics: Weights have decreased 16.6% (35 lbs) from usual weight in 4 months; significant   Height: 5'2" Weight: 176 lb 12.8 oz  UBW: 211 lb 12.8 oz (01/25/21) BMI: 32.30    NUTRITION DIAGNOSIS: Inadequate oral intake related to side effects of chemotherapy as evidenced by reported poor appetite, fatigue, significant 16.6% wt loss in 4 months   MALNUTRITION DIAGNOSIS: Patient meets criteria for severe malnutrition in the context of chronic disease related to endometrial cancer and associated treatments as evidenced by intake meeting <75% of estimated needs >1 month, moderate fat and muscle depletion noted on exam, significant 16.6% (35 lb) wt loss in 4 months.    INTERVENTION:  Congratulated pt on final chemotherapy treatment  Educated on small frequent meals and snacks  with adequate calories and protein - handout with snack ideas provided Recommend pt drink 2 Ensure Plus/equivalent daily - coupons provided One complimentary case of Ensure Plus High Protein provided  Contact information provided    MONITORING, EVALUATION, GOAL: Patient will tolerate increased calories and protein to promote weight gain   Next Visit: To be scheduled

## 2021-06-10 NOTE — Progress Notes (Signed)
Patient presents today for chemotherapy infusion.  Patient is in satisfactory condition with no new complaints voiced.  Vital signs are stable.  Labs reviewed.  All labs are within treatment parameters.  Magnesium today is 1.5.  Patient will receive 2 grams of Magnesium IV per Dr. Delton Coombes.  We will proceed with treatment per MD orders.   Patient tolerated treatment well with no complaints voiced.  Patient left via wheelchair  in stable condition.  Vital signs stable at discharge.  Follow up as scheduled.

## 2021-06-10 NOTE — Progress Notes (Signed)
Updated dose per MD based on pts current weight of 80.1 kg

## 2021-06-10 NOTE — Progress Notes (Signed)
Patients port flushed without difficulty.  Good blood return noted with no bruising or swelling noted at site.  Stable during access and blood draw.  Patient to remain accessed for treatment. 

## 2021-06-10 NOTE — Patient Instructions (Signed)
[facility]  Discharge Instructions: Thank you for choosing [facility] to provide your oncology and hematology care.  If you have a lab appointment with the Cancer Center, please come in thru the Main Entrance and check in at the main information desk.  Wear comfortable clothing and clothing appropriate for easy access to any Portacath or PICC line.   We strive to give you quality time with your provider. You may need to reschedule your appointment if you arrive late (15 or more minutes).  Arriving late affects you and other patients whose appointments are after yours.  Also, if you miss three or more appointments without notifying the office, you may be dismissed from the clinic at the provider's discretion.      For prescription refill requests, have your pharmacy contact our office and allow 72 hours for refills to be completed.        To help prevent nausea and vomiting after your treatment, we encourage you to take your nausea medication as directed.  BELOW ARE SYMPTOMS THAT SHOULD BE REPORTED IMMEDIATELY: *FEVER GREATER THAN 100.4 F (38 C) OR HIGHER *CHILLS OR SWEATING *NAUSEA AND VOMITING THAT IS NOT CONTROLLED WITH YOUR NAUSEA MEDICATION *UNUSUAL SHORTNESS OF BREATH *UNUSUAL BRUISING OR BLEEDING *URINARY PROBLEMS (pain or burning when urinating, or frequent urination) *BOWEL PROBLEMS (unusual diarrhea, constipation, pain near the anus) TENDERNESS IN MOUTH AND THROAT WITH OR WITHOUT PRESENCE OF ULCERS (sore throat, sores in mouth, or a toothache) UNUSUAL RASH, SWELLING OR PAIN  UNUSUAL VAGINAL DISCHARGE OR ITCHING   Items with * indicate a potential emergency and should be followed up as soon as possible or go to the Emergency Department if any problems should occur.  Please show the CHEMOTHERAPY ALERT CARD or IMMUNOTHERAPY ALERT CARD at check-in to the Emergency Department and triage nurse.  Should you have questions after your visit or need to cancel  or reschedule your appointment, please contact [facility] [phone]  and follow the prompts.  Office hours are 8:00 a.m. to 4:30 p.m. Monday - Friday. Please note that voicemails left after 4:00 p.m. may not be returned until the following business day.  We are closed weekends and major holidays. You have access to a nurse at all times for urgent questions. Please call the main number to the clinic [phone] and follow the prompts.  For any non-urgent questions, you may also contact your provider using MyChart. We now offer e-Visits for anyone 18 and older to request care online for non-urgent symptoms. For details visit [email or web address].   Also download the MyChart app! Go to the app store, search "MyChart", open the app, select , and log in with your MyChart username and password.  Due to Covid, a mask is required upon entering the hospital/clinic. If you do not have a mask, one will be given to you upon arrival. For doctor visits, patients may have 1 support person aged 18 or older with them. For treatment visits, patients cannot have anyone with them due to current Covid guidelines and our immunocompromised population.  

## 2021-06-11 ENCOUNTER — Ambulatory Visit (HOSPITAL_COMMUNITY): Payer: 59

## 2021-06-11 DIAGNOSIS — R278 Other lack of coordination: Secondary | ICD-10-CM

## 2021-06-11 DIAGNOSIS — R29898 Other symptoms and signs involving the musculoskeletal system: Secondary | ICD-10-CM

## 2021-06-11 DIAGNOSIS — Z5111 Encounter for antineoplastic chemotherapy: Secondary | ICD-10-CM | POA: Diagnosis not present

## 2021-06-11 DIAGNOSIS — R208 Other disturbances of skin sensation: Secondary | ICD-10-CM

## 2021-06-11 NOTE — Patient Instructions (Signed)
° ° °  For toothbrush holding

## 2021-06-12 ENCOUNTER — Ambulatory Visit (HOSPITAL_COMMUNITY): Payer: PRIVATE HEALTH INSURANCE | Admitting: Hematology

## 2021-06-12 ENCOUNTER — Other Ambulatory Visit (HOSPITAL_COMMUNITY): Payer: PRIVATE HEALTH INSURANCE

## 2021-06-12 ENCOUNTER — Inpatient Hospital Stay (HOSPITAL_COMMUNITY): Payer: 59 | Attending: Hematology

## 2021-06-12 ENCOUNTER — Ambulatory Visit (HOSPITAL_COMMUNITY): Payer: PRIVATE HEALTH INSURANCE

## 2021-06-12 DIAGNOSIS — G629 Polyneuropathy, unspecified: Secondary | ICD-10-CM | POA: Insufficient documentation

## 2021-06-12 DIAGNOSIS — E876 Hypokalemia: Secondary | ICD-10-CM | POA: Insufficient documentation

## 2021-06-12 DIAGNOSIS — C541 Malignant neoplasm of endometrium: Secondary | ICD-10-CM | POA: Insufficient documentation

## 2021-06-12 DIAGNOSIS — D649 Anemia, unspecified: Secondary | ICD-10-CM | POA: Insufficient documentation

## 2021-06-13 ENCOUNTER — Encounter (HOSPITAL_COMMUNITY): Payer: Self-pay

## 2021-06-13 NOTE — Therapy (Signed)
Michele Romero, [place], [address] Phone: [phone]   Fax:  [phone]  Occupational Therapy Treatment  Patient Details  Name: Michele Romero MRN: [id number] Date of Birth: 16-Dec-1960 Referring Provider (OT): Derek Jack, MD   Encounter Date: 06/11/2021   OT End of Session - 06/13/21 1945     Visit Number 2    Number of Visits 6    Date for OT Re-Evaluation 06/28/21    Authorization Type [employer]    Authorization Time Period no copay. Visit limit: 30 OT/PT/Chiro - 0 used    Authorization - Visit Number 2    Authorization - Number of Visits 30    OT Start Time 5009    OT Stop Time 1425    OT Time Calculation (min) 40 min    Activity Tolerance Patient tolerated treatment well    Behavior During Therapy WFL for tasks assessed/performed             Past Medical History:  Diagnosis Date   Ambulates with cane 10/31/2020   Arthritis    RIGHT HIP   Asthma    Cigarette nicotine dependence without complication 38/18/2993   Complex tear of medial meniscus of right knee 01/20/2017   COPD (chronic obstructive pulmonary disease) ([place])    ENDOMETRIAL CANCER 10/31/2020   Endometrial cancer ([place]) 11/15/2020   GERD (gastroesophageal reflux disease)    Migraines    Port-A-Cath in place 01/01/2021    Past Surgical History:  Procedure Laterality Date   APPENDECTOMY     YRS AGO PER PT ON 10-31-2020   BIOPSY  11/29/2019   Procedure: BIOPSY;  Surgeon: Harvel Quale, MD;  [facility];  Service: Gastroenterology;;  ileocecal vlve   CHOLECYSTECTOMY     YRS AGO PER PT ON 10-31-2020   COLONOSCOPY WITH PROPOFOL N/A 11/29/2019   Procedure: COLONOSCOPY WITH PROPOFOL;  Surgeon: Harvel Quale, MD;  [facility];  Service: Gastroenterology;  Laterality: N/A;  930   HYSTEROSCOPY WITH D & C N/A 10/10/2020   Procedure: DILATATION AND CURETTAGE /HYSTEROSCOPY;  Surgeon:  Florian Buff, MD;  Location: [facility];  Service: Gynecology;  Laterality: N/A;   IR IMAGING GUIDED PORT INSERTION  01/08/2021   KNEE ARTHROPLASTY  2018   left knee   POLYPECTOMY  11/29/2019   Procedure: POLYPECTOMY;  Surgeon: Montez Morita, Quillian Quince, MD;  [facility];  Service: Gastroenterology;;  sigmoid colon    ROBOTIC ASSISTED TOTAL HYSTERECTOMY WITH BILATERAL SALPINGO OOPHERECTOMY N/A 11/15/2020   Procedure: XI ROBOTIC ASSISTED TOTAL HYSTERECTOMY GREATER THAN TWO HUNDRED AND FIFTY GRAMS WITH BILATERAL SALPINGO OOPHORECTOMY, MINI LAPAROTOMY;  Surgeon: Everitt Amber, MD;  Location: [facility];  Service: Gynecology;  Laterality: N/A;   SENTINEL NODE BIOPSY N/A 11/15/2020   Procedure: SENTINEL NODE BIOPSY;  Surgeon: Everitt Amber, MD;  Location: [facility];  Service: Gynecology;  Laterality: N/A;   TUBAL LIGATION  04/14/1985    There were no vitals filed for this visit.   Subjective Assessment - 06/13/21 1327     Subjective  S: Is interested in getting a powered wheelchair.    Currently in Pain? Yes    Pain Score 8     Pain Location Hand    Pain Orientation Right;Left    Pain Descriptors / Indicators Constant    Pain Type Neuropathic pain    Pain Radiating Towards left hand pain radiates up into her  left elbow    Pain Onset More than a month ago    Pain Frequency Constant    Aggravating Factors  touch is sensitive    Pain Relieving Factors nothing    Effect of Pain on Daily Activities unable to complete any daily tasks using her bilateral hands.    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 Active endometrial cancer                      OT Treatments/Exercises (OP) - 06/13/21 1414        ADLs   Eating Adaptive equipment used while simulating a self feeding task. Universal cuff used in right hand; fork placed initially straight then bent down. Food bumper placed on plate to assist with scooping food with spoon.    Grooming OT fabricated a universal cuff using red built up foam and yellow theraband for a loop strap to allow her to hold onto toothbrush to brush teeth more independently.                    OT Education - 06/13/21 1942     Education Details Print out of food bumper options, different variety of universal cuffs that may be purchased on [employer]. Photo of adaptive equipment used during class taken on patient's cell phone. Piece of Dycem provided to place under plate or bowl to prevent from sliding during meals.    Person(s) Educated Patient    Methods Explanation;Demonstration;Handout    Comprehension Verbalized understanding;Returned demonstration              OT Short Term Goals - 06/13/21 1958       OT SHORT TERM GOAL #1   Title Patient will be educated and independent with her HEP to work towards increasing the ROM, strength and  functional use of BUE when completing ADL tasks with assist.    Time 6    Period Weeks    Status On-going    Target Date 06/28/21      OT SHORT TERM GOAL #2   Title Patient will increase her BUE A/ROM (wrist and forearm) by 10 degrees or more where lacking in order to increase her participation in dressing tasks with assistance from family.    Time 6    Period Weeks    Status On-going      OT SHORT TERM GOAL #3   Title Patient will complete self feeding with Supervision/set-up assist with the use of adaptive equipment.    Time 6    Period Weeks    Status New      OT SHORT TERM GOAL #4   Title Patient will be educated and verbalize understanding of DME, adaptive equipment, and/or compensatory techniques that will allow her to participate in basic ADL tasks, such as bathing, grooming, and dressing with  maximum assistance or less.    Time 6    Period Weeks    Status On-going                      Plan - 06/13/21 1945     Clinical Impression Statement  A: Discussed treatment plan for OT while focusing on increasing patient's participation in ADL tasks. Today during session, patient was educated on use of an universal cuff to assist with holding eating utensils to allow her to feed herself. Simulated self feeding task using fork while spearing Playdoh balls with fork. Food bumper was used with spoon held in universal cuff. Discussed benefits of powered wheelchair and patient was interested in pursuing. OT will contact Brandon at [employer] to initiate wheelchair evaluation.    Occupational performance deficits (Please refer to evaluation for details): ADL's;IADL's;Leisure;Work    Marketing executive / Function / Physical Skills ADL;UE functional use;Balance;Pain;FMC;ROM;Coordination;GMC;Sensation;Edema;Mobility;Tone;IADL;Strength    Clinical Decision Making Multiple treatment options, significant modification of task necessary    Plan P: Follow up on universal cuff, self feeding, and brushing teeth. Inform patient that wheelchair evaluation will take place at OT appointment on 3/14 with [facility]. Look at cell phone accessibility (voice activation).Wrist and forearm ROM with HEP.    Consulted and Agree with Plan of Care Patient             Patient will benefit from skilled therapeutic intervention in order to improve the following deficits and impairments:   Body Structure / Function / Physical Skills: ADL, UE functional use, Balance, Pain, FMC, ROM, Coordination, GMC, Sensation, Edema, Mobility, Tone, IADL, Strength       Visit Diagnosis: Other symptoms and signs involving the musculoskeletal system  Other lack of coordination  Other disturbances of skin sensation    Problem List Patient Active Problem List   Diagnosis Date Noted   Dysuria 05/08/2021   Drug-induced  neutropenia ([place]) 05/07/2021   Port-A-Cath in place 01/01/2021   Endometrial cancer ([place]) 11/15/2020   PMB (postmenopausal bleeding)    Thickened endometrium    Endometrial polyp    Left ear impacted cerumen 10/09/2020   Unilateral primary osteoarthritis, right hip 12/06/2019   Right hip pain 10/30/2019   Encounter for screening for malignant neoplasm of colon 10/30/2019   Encounter for general adult medical examination with abnormal findings 10/30/2019   Essential hypertension 10/30/2019   Chronic obstructive pulmonary disease ([place]) 10/28/2019   Prediabetes 01/20/2017   Osteoarthritis of right knee 01/20/2017   Morbid obesity ([place]) 06/25/2015    Ailene Ravel, OTR/L,CBIS  [phone]  06/13/2021, 8:14 PM  [place], [place], [address] Phone: [phone]   Fax:  [phone]  Name: Michele Romero MRN: [id number] Date of Birth: 1961/03/05

## 2021-06-14 ENCOUNTER — Ambulatory Visit (HOSPITAL_COMMUNITY): Payer: PRIVATE HEALTH INSURANCE

## 2021-06-18 ENCOUNTER — Encounter: Payer: Self-pay | Admitting: Oncology

## 2021-06-18 ENCOUNTER — Other Ambulatory Visit: Payer: Self-pay

## 2021-06-18 ENCOUNTER — Ambulatory Visit (HOSPITAL_COMMUNITY): Payer: 59 | Attending: Hematology

## 2021-06-18 DIAGNOSIS — C541 Malignant neoplasm of endometrium: Secondary | ICD-10-CM

## 2021-06-18 DIAGNOSIS — R208 Other disturbances of skin sensation: Secondary | ICD-10-CM | POA: Diagnosis present

## 2021-06-18 DIAGNOSIS — R29898 Other symptoms and signs involving the musculoskeletal system: Secondary | ICD-10-CM | POA: Diagnosis not present

## 2021-06-18 DIAGNOSIS — R278 Other lack of coordination: Secondary | ICD-10-CM | POA: Insufficient documentation

## 2021-06-18 NOTE — Progress Notes (Signed)
Referral placed for survivorship. ?

## 2021-06-19 ENCOUNTER — Encounter: Payer: Self-pay | Admitting: Gynecologic Oncology

## 2021-06-20 ENCOUNTER — Encounter (HOSPITAL_COMMUNITY): Payer: Self-pay

## 2021-06-20 NOTE — Therapy (Signed)
[facility] ?[address] ?[place], [place], [address] ?Phone: [phone]   Fax:  [phone] ? ?Occupational Therapy Treatment ? ?Patient Details  ?Name: Michele Romero ?MRN: [id number] ?Date of Birth: 17-Nov-1960 ?Referring Provider (OT): Derek Jack, MD ? ? ?Encounter Date: 06/18/2021 ? ? OT End of Session - 06/20/21 1307   ? ? Visit Number 3   ? Number of Visits 6   ? Date for OT Re-Evaluation 06/28/21   ? Authorization Type [employer]   ? Authorization Time Period no copay. Visit limit: 30 OT/PT/Chiro - 0 used   ? Authorization - Visit Number 3   ? Authorization - Number of Visits 30   ? OT Start Time 1515   ? OT Stop Time 4196   ? OT Time Calculation (min) 38 min   ? Activity Tolerance Patient tolerated treatment well   ? Behavior During Therapy [facility] for tasks assessed/performed   ? ?  ?  ? ?  ? ? ?Past Medical History:  ?Diagnosis Date  ? Ambulates with cane 10/31/2020  ? Arthritis   ? RIGHT HIP  ? Asthma   ? Cigarette nicotine dependence without complication 22/29/7989  ? Complex tear of medial meniscus of right knee 01/20/2017  ? COPD (chronic obstructive pulmonary disease) ([place])   ? ENDOMETRIAL CANCER 10/31/2020  ? Endometrial cancer ([place]) 11/15/2020  ? GERD (gastroesophageal reflux disease)   ? Migraines   ? Port-A-Cath in place 01/01/2021  ? ? ?Past Surgical History:  ?Procedure Laterality Date  ? APPENDECTOMY    ? YRS AGO PER PT ON 10-31-2020  ? BIOPSY  11/29/2019  ? Procedure: BIOPSY;  Surgeon: Montez Morita, Quillian Quince, MD;  [facility];  Service: Gastroenterology;;  ileocecal vlve  ? CHOLECYSTECTOMY    ? YRS AGO PER PT ON 10-31-2020  ? COLONOSCOPY WITH PROPOFOL N/A 11/29/2019  ? Procedure: COLONOSCOPY WITH PROPOFOL;  Surgeon: Harvel Quale, MD;  [facility];  Service: Gastroenterology;  Laterality: N/A;  930  ? HYSTEROSCOPY WITH D & C N/A 10/10/2020  ? Procedure: DILATATION AND CURETTAGE /HYSTEROSCOPY;  Surgeon:  Florian Buff, MD;  [facility];  Service: Gynecology;  Laterality: N/A;  ? IR IMAGING GUIDED PORT INSERTION  01/08/2021  ? KNEE ARTHROPLASTY  2018  ? left knee  ? POLYPECTOMY  11/29/2019  ? Procedure: POLYPECTOMY;  Surgeon: Harvel Quale, MD;  [facility];  Service: Gastroenterology;;  sigmoid colon   ? ROBOTIC ASSISTED TOTAL HYSTERECTOMY WITH BILATERAL SALPINGO OOPHERECTOMY N/A 11/15/2020  ? Procedure: XI ROBOTIC ASSISTED TOTAL HYSTERECTOMY GREATER THAN TWO HUNDRED AND FIFTY GRAMS WITH BILATERAL SALPINGO OOPHORECTOMY, MINI LAPAROTOMY;  Surgeon: Everitt Amber, MD;  Location: [facility];  Service: Gynecology;  Laterality: N/A;  ? SENTINEL NODE BIOPSY N/A 11/15/2020  ? Procedure: SENTINEL NODE BIOPSY;  Surgeon: Everitt Amber, MD;  Location: [facility];  Service: Gynecology;  Laterality: N/A;  ? TUBAL LIGATION  04/14/1985  ? ? ?There were no vitals filed for this visit. ? ? Subjective Assessment - 06/20/21 1252   ? ? Subjective  S: The spoon is hard but I'm pretty good with the fork   ? Currently in Pain? Yes   ? Pain Score 8    ? Pain Location Hand   ? Pain Orientation Right;Left   ? Pain Descriptors / Indicators Constant;Numbness   ? Pain Type Neuropathic pain   ? Pain Onset More than a month  ago   ? Pain Frequency Constant   ? Aggravating Factors  nothing   ? Pain Relieving Factors nothing   ? Effect of Pain on Daily Activities unable to functionally use hands to complete daily tasks.   ? Multiple Pain Sites No   ? ?  ?  ? ?  ? ? ? ? ? [facility] OT Assessment - 06/20/21 1253   ? ?  ? Assessment  ? 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   ?  ? Precautions  ? Precautions Other (comment)   ? 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 Active endometrial cancer   ? ?  ?  ? ?  ? ? ? ? ? ? ? ? ? ? ? OT Treatments/Exercises (OP) - 06/20/21 1253   ? ?  ? ADLs  ? Overall ADLs Worked with patient's personal cell phone to  set up Teacher, music to allow her to make phone calls without or with minimal use of her right hand. Sent text message to phone via Teacher, music. Adapted sylus pen holder fabricated using splint material. Fit for placement on right index finger with thumb to hold in place for additional support.   ? ?  ?  ? ?  ? ? ? ? ? ? ? ? ? ? ? OT Short Term Goals - 06/20/21 1405   ? ?  ? OT SHORT TERM GOAL #1  ? Title Patient will be educated and independent with her HEP to work towards increasing the ROM, strength and  functional use of BUE when completing ADL tasks with assist.   ? Time 6   ? Period Weeks   ? Status On-going   ? Target Date 06/28/21   ?  ? OT SHORT TERM GOAL #2  ? Title Patient will increase her BUE A/ROM (wrist and forearm) by 10 degrees or more where lacking in order to increase her participation in dressing tasks with assistance from family.   ? Time 6   ? Period Weeks   ? Status On-going   ?  ? OT SHORT TERM GOAL #3  ? Title Patient will complete self feeding with Supervision/set-up assist with the use of adaptive equipment.   ? Time 6   ? Period Weeks   ? Status On-going   ?  ? OT SHORT TERM GOAL #4  ? Title Patient will be educated and verbalize understanding of DME, adaptive equipment, and/or compensatory techniques that will allow her to participate in basic ADL tasks, such as bathing, grooming, and dressing with maximum assistance or less.   ? Time 6   ? Period Weeks   ? Status On-going   ? ?  ?  ? ?  ? ? ? ? ? ? ? ? ? ? ? Plan - 06/20/21 1309   ? ? Clinical Impression Statement A: Focused session on exploring cell phone features for accessibility allowing for voice assistance. Google assistant set-up to allow for phone calls. Required VC for success when making phone call and also sending text message with voice commands. Did not provide sylus holder for patient to take and use with phone at this session as she is unable to put it on her finger independently. I would like to problem solve  some possible ways, she could do this independently before having her use it if possible. Discussed that next session will be her powered wheelchair evaluation with Erlene Quan and Magda Paganini.   ? Body Structure /  Function / Physical Skills ADL;UE functional use;Balance;Pain;FMC;ROM;Coordination;GMC;Sensation;Edema;Mobility;Tone;IADL;Strength   ? Plan P: Wheelchair evaluation. Send updated cert extending for 4 more weeks.   ? Consulted and Agree with Plan of Care Patient   ? ?  ?  ? ?  ? ? ?Patient will benefit from skilled therapeutic intervention in order to improve the following deficits and impairments:   ?Body Structure / Function / Physical Skills: ADL, UE functional use, Balance, Pain, FMC, ROM, Coordination, GMC, Sensation, Edema, Mobility, Tone, IADL, Strength ?  ?  ? ? ?Visit Diagnosis: ?Other symptoms and signs involving the musculoskeletal system ? ?Other disturbances of skin sensation ? ?Other lack of coordination ? ? ? ?Problem List ?Patient Active Problem List  ? Diagnosis Date Noted  ? Dysuria 05/08/2021  ? Drug-induced neutropenia ([place]) 05/07/2021  ? Port-A-Cath in place 01/01/2021  ? Endometrial cancer ([facility]) 11/15/2020  ? PMB (postmenopausal bleeding)   ? Thickened endometrium   ? Endometrial polyp   ? Left ear impacted cerumen 10/09/2020  ? Unilateral primary osteoarthritis, right hip 12/06/2019  ? Right hip pain 10/30/2019  ? Encounter for screening for malignant neoplasm of colon 10/30/2019  ? Encounter for general adult medical examination with abnormal findings 10/30/2019  ? Essential hypertension 10/30/2019  ? Chronic obstructive pulmonary disease ([place]) 10/28/2019  ? Prediabetes 01/20/2017  ? Osteoarthritis of right knee 01/20/2017  ? Morbid obesity ([place]) 06/25/2015  ? ?Ailene Ravel, OTR/L,CBIS  ?[phone] ? ?06/20/2021, 2:05 PM ? ?[facility] ?[address] ?[place], [place], [address] ?Phone: [phone]   Fax:  [phone] ? ?Name: Michele Romero ?MRN: [id number] ?Date of Birth: 07-08-1960 ? ?

## 2021-06-21 ENCOUNTER — Ambulatory Visit: Payer: 59 | Admitting: Gynecologic Oncology

## 2021-06-21 DIAGNOSIS — C549 Malignant neoplasm of corpus uteri, unspecified: Secondary | ICD-10-CM

## 2021-06-25 ENCOUNTER — Ambulatory Visit (HOSPITAL_COMMUNITY): Payer: 59 | Admitting: Occupational Therapy

## 2021-06-25 ENCOUNTER — Encounter (HOSPITAL_COMMUNITY): Payer: Self-pay | Admitting: Occupational Therapy

## 2021-06-25 ENCOUNTER — Other Ambulatory Visit: Payer: Self-pay

## 2021-06-25 DIAGNOSIS — R29898 Other symptoms and signs involving the musculoskeletal system: Secondary | ICD-10-CM

## 2021-06-25 DIAGNOSIS — R208 Other disturbances of skin sensation: Secondary | ICD-10-CM

## 2021-06-25 DIAGNOSIS — R278 Other lack of coordination: Secondary | ICD-10-CM

## 2021-06-26 ENCOUNTER — Encounter: Payer: Self-pay | Admitting: Radiology

## 2021-06-26 NOTE — Therapy (Signed)
[facility] ?[address] ?[place], [place], [address] ?Phone: [phone]   Fax:  [phone] ? ?Occupational Therapy Wheelchair Evaluation ? ?Patient Details  ?Name: Michele Romero ?MRN: [id number] ?Date of Birth: 09/11/1960 ?Referring Provider (OT): Derek Jack, MD ? ? ?Encounter Date: 06/25/2021 ? ? OT End of Session - 06/26/21 1759   ? ? Visit Number 4   ? Number of Visits 6   ? Date for OT Re-Evaluation 06/28/21   ? Authorization Type [employer]   ? Authorization Time Period no copay. Visit limit: 30 OT/PT/Chiro - 0 used   ? Authorization - Visit Number 4   ? Authorization - Number of Visits 30   ? OT Start Time 1345   ? OT Stop Time 1430   ? OT Time Calculation (min) 45 min   ? Activity Tolerance Patient tolerated treatment well   ? Behavior During Therapy [facility] for tasks assessed/performed   ? ?  ?  ? ?  ? ? ?Past Medical History:  ?Diagnosis Date  ? Ambulates with cane 10/31/2020  ? Arthritis   ? RIGHT HIP  ? Asthma   ? Cigarette nicotine dependence without complication 13/24/4010  ? Complex tear of medial meniscus of right knee 01/20/2017  ? COPD (chronic obstructive pulmonary disease) ([place])   ? ENDOMETRIAL CANCER 10/31/2020  ? Endometrial cancer ([place]) 11/15/2020  ? GERD (gastroesophageal reflux disease)   ? History of radiation therapy   ? HDR brachytherapy [place] 05/08/2021-06/03/2021  Dr Gery Pray  ? Migraines   ? Port-A-Cath in place 01/01/2021  ? ? ?Past Surgical History:  ?Procedure Laterality Date  ? APPENDECTOMY    ? YRS AGO PER PT ON 10-31-2020  ? BIOPSY  11/29/2019  ? Procedure: BIOPSY;  Surgeon: Montez Morita, Quillian Quince, MD;  [facility];  Service: Gastroenterology;;  ileocecal vlve  ? CHOLECYSTECTOMY    ? YRS AGO PER PT ON 10-31-2020  ? COLONOSCOPY WITH PROPOFOL N/A 11/29/2019  ? Procedure: COLONOSCOPY WITH PROPOFOL;  Surgeon: Harvel Quale, MD;  [facility];  Service: Gastroenterology;  Laterality: N/A;   930  ? HYSTEROSCOPY WITH D & C N/A 10/10/2020  ? Procedure: DILATATION AND CURETTAGE /HYSTEROSCOPY;  Surgeon: Florian Buff, MD;  [facility];  Service: Gynecology;  Laterality: N/A;  ? IR IMAGING GUIDED PORT INSERTION  01/08/2021  ? KNEE ARTHROPLASTY  2018  ? left knee  ? POLYPECTOMY  11/29/2019  ? Procedure: POLYPECTOMY;  Surgeon: Harvel Quale, MD;  [facility];  Service: Gastroenterology;;  sigmoid colon   ? ROBOTIC ASSISTED TOTAL HYSTERECTOMY WITH BILATERAL SALPINGO OOPHERECTOMY N/A 11/15/2020  ? Procedure: XI ROBOTIC ASSISTED TOTAL HYSTERECTOMY GREATER THAN TWO HUNDRED AND FIFTY GRAMS WITH BILATERAL SALPINGO OOPHORECTOMY, MINI LAPAROTOMY;  Surgeon: Everitt Amber, MD;  Location: [facility];  Service: Gynecology;  Laterality: N/A;  ? SENTINEL NODE BIOPSY N/A 11/15/2020  ? Procedure: SENTINEL NODE BIOPSY;  Surgeon: Everitt Amber, MD;  Location[facility];  Service: Gynecology;  Laterality: N/A;  ? TUBAL LIGATION  04/14/1985  ? ? ?There were no vitals filed for this visit. ? ? ? ? ?Mobility/Seating Evaluation  ?  ?PATIENT INFORMATION: ?Name: Michele Romero DOB: 09-Oct-1960  Sex: Female Date seen: 06/25/21 Time: 1:45pm  ?Address:  [address], [place], [place] [address] Physician: Derek Jack, MD This evaluation/justification form will serve as the LMN for the following suppliers: ?__________________________ ?Supplier: [employer] ?Contact Person: Deberah Pelton ?Phone:  [phone]  ? Seating Therapist: Deberah Pelton ?Phone:   [phone]   ?Phone: [phone]    ?Spouse/Parent/Caregiver name:  ? ?Phone number:  Insurance/Payer: 1) [employer] ?2) [employer] Commercial ? ?   ?Reason for Referral: Neuropathy associated with cancer  ?Patient/Caregiver Goals: To improve mobility and ability to participate in ADLs  ?Patient was seen for face-to-face evaluation for new power wheelchair.  Also present was Deberah Pelton to discuss recommendations and wheelchair  options.  Further paperwork was completed and sent to vendor.  Patient appears to qualify for power mobility device at this time per objective findings.   ?MEDICAL HISTORY: ?Diagnosis: Primary Diagnosis: Neuropathy associated with cancer ?Onset: 12/2020 Diagnosis: Endometrial cancer  ? ?'[x]'$ Progressive Disease Relevant past and future surgeries: 11/2020: ROBOTIC ASSISTED TOTAL HYSTERECTOMY WITH BILATERAL SALPINGO OOPHERECTOMY; 11/2020: SENTINEL NODE BIOPSY; 12/2020: IR IMAGING GUIDED PORT INSERTION ?  ?Height: 5'4" Weight: 176 Explain recent changes or trends in weight:  ?  ?History including Falls: Approximately 4 falls in the last 6 months  ?  ?HOME ENVIRONMENT: ?'[x]'$ House  '[]'$ Condo/town home  '[]'$ Apartment  '[]'$ Assisted Living    ?'[]'$ Lives Alone '[x]'$  Lives with Others                                                                                          Hours with caregiver: 24/7: Pt lives with daughter and granddaughter. Daughter works, granddaughter is home to assist.   ?'[]'$ Home is accessible to patient           Stairs      '[x]'$ Yes '[]'$  No     Ramp '[x]'$ Yes '[]'$ No ?Comments:  In the process of having a ramp built  ? ?COMMUNITY ADL: ?TRANSPORTATION: ?'[x]'$ Car    '[]'$ Van    '[]'$ Public Transportation    '[]'$ Adapted w/c Lift    '[]'$ Ambulance    '[]'$ Other:       '[]'$ Sits in wheelchair during transport  ?Employment/School:  ?Specific requirements pertaining to mobility   ?Other:  ?  ? ?FUNCTIONAL/SENSORY PROCESSING SKILLS:  ?Handedness:   '[x]'$ Right     '[]'$ Left    '[]'$ NA  Comments:    ?Functional Processing Skills for Wheeled Mobility ?'[x]'$ Processing Skills are adequate for safe wheelchair operation  ?Areas of concern than may interfere with safe operation of wheelchair Description of problem   ?'[]'$  Attention to environment      '[]'$ Judgment      '[]'$  Hearing  ?'[]'$  Vision or visual processing      '[x]'$ Motor Planning  ?'[]'$  Fluctuations in Behavior  Left hand has no active movement. Pt is able to make a fist and extend fingers of right hand by approximately 25% ?   ? ?VERBAL COMMUNICATION: ?'[]'$ WFL receptive '[]'$  WFL expressive '[]'$ Understandable  '[]'$ Difficult to understand  '[]'$ non-communicative ?'[]'$  Uses an augmented communication device  ?CURRENT SEATING / MOBILITY: ?Current Mobility Base:  '[]'$ None '[]'$ Dependent '[x]'$ Manual '[]'$ Scooter '[]'$ Power  Type of Control:   ?Manufacturer:  Size:  Age:   ?Current Condition of Mobility Base:  Fair-pt is borrowing a manual wheelchair from a friend, shows signs of wear and tear   ?Current Wheelchair components:    ?Describe posture in present seating system:  Seated upright, slouched with mild anterior pelvic tilt  ? ? ?  ?SENSATION and SKIN ISSUES: ?Sensation ?'[x]'$ Intact  '[]'$ Impaired '[]'$ Absent  ?Level of sensation:  Pressure Relief: ?Able to perform effective pressure relief :    '[]'$ Yes  '[x]'$  No ?Method: Pt is unable to weightshift effectively while seated ?If not, Why?:   ?Skin Issues/Skin Integrity ?Current Skin Issues  '[]'$ Yes '[x]'$ No ?'[]'$ Intact '[]'$  Red area'[]'$  Open Area  ?'[]'$ Scar Tissue '[]'$ At risk from prolonged sitting ?Where    ?History of Skin Issues  '[]'$ Yes '[x]'$ No ?Where   ?When    ?Hx of skin flap surgeries  '[]'$ Yes '[x]'$ No ?Where   ?When    ?Limited sitting tolerance '[x]'$ Yes '[]'$ No Hours spent sitting in wheelchair daily: 4-5 hours  ?Complaint of Pain:  Please describe:  ?  ?Swelling/Edema: Pt with edema noted   ? ?ADL STATUS (in reference to wheelchair use): ? Indep Assist Unable Indep with Equip Not assessed Comments  ?Dressing '[]'$  '[x]'$  '[]'$  '[]'$  '[]'$  Mod to max assistance  ?Eating '[]'$  '[x]'$  '[]'$  '[]'$  '[]'$  Min assist, pt uses universal cuff to participate  ?Toileting '[x]'$  '[]'$  '[x]'$  '[]'$  '[]'$  Total Assistance  ?Bathing '[]'$  '[x]'$  '[]'$  '[]'$  '[]'$  Max assist  ?Grooming/Hygiene '[]'$  '[x]'$  '[]'$  '[]'$  '[]'$  Mod to max assist; pt using universal cuff to participate  ?Meal Prep '[]'$  '[]'$  '[x]'$  '[]'$  '[]'$  Total Assistance  ?IADLS '[]'$  '[]'$  '[x]'$  '[]'$  '[]'$  Total Assistance  ?Bowel Management: '[x]'$ Continent  '[]'$ Incontinent  '[]'$ Accidents Comments:    ?Bladder Management: '[]'$ Continent  '[x]'$ Incontinent  '[x]'$ Accidents Comments:    ?  ? ?WHEELCHAIR  SKILLS: ?Manual w/c Propulsion: '[]'$ UE or LE strength and endurance sufficient to participate in ADLs using manual wheelchair Arm : '[]'$ left '[]'$ right   '[]'$ Both      Distance:  ?Foot:  '[]'$ left '[]'$ right   '[]'$ Both  ?Oper

## 2021-06-27 ENCOUNTER — Ambulatory Visit (HOSPITAL_COMMUNITY): Payer: 59 | Admitting: Occupational Therapy

## 2021-07-02 ENCOUNTER — Encounter (HOSPITAL_COMMUNITY): Payer: Self-pay | Admitting: Radiology

## 2021-07-02 ENCOUNTER — Other Ambulatory Visit: Payer: Self-pay

## 2021-07-02 ENCOUNTER — Inpatient Hospital Stay (HOSPITAL_COMMUNITY): Payer: 59

## 2021-07-02 ENCOUNTER — Ambulatory Visit (HOSPITAL_COMMUNITY)
Admission: RE | Admit: 2021-07-02 | Discharge: 2021-07-02 | Disposition: A | Discharge status: Home / Self Care | Payer: 59 | Source: Ambulatory Visit | Attending: Hematology | Admitting: Hematology

## 2021-07-02 DIAGNOSIS — C541 Malignant neoplasm of endometrium: Secondary | ICD-10-CM | POA: Insufficient documentation

## 2021-07-02 LAB — COMPREHENSIVE METABOLIC PANEL
ALT: 10 U/L (ref 0–44)
AST: 12 U/L — ABNORMAL LOW (ref 15–41)
Albumin: 3.4 g/dL — ABNORMAL LOW (ref 3.5–5.0)
Alkaline Phosphatase: 58 U/L (ref 38–126)
Anion gap: 7 (ref 5–15)
BUN: 12 mg/dL (ref 8–23)
CO2: 26 mmol/L (ref 22–32)
Calcium: 8.7 mg/dL — ABNORMAL LOW (ref 8.9–10.3)
Chloride: 107 mmol/L (ref 98–111)
Creatinine, Ser: 0.51 mg/dL (ref 0.44–1.00)
GFR, Estimated: >60 mL/min (ref >60)
Glucose, Bld: 124 mg/dL — ABNORMAL HIGH (ref 70–99)
Potassium: 3.6 mmol/L (ref 3.5–5.1)
Sodium: 140 mmol/L (ref 135–145)
Total Bilirubin: 0.4 mg/dL (ref 0.3–1.2)
Total Protein: 6.9 g/dL (ref 6.5–8.1)

## 2021-07-02 LAB — CBC WITH DIFFERENTIAL/PLATELET
Abs Immature Granulocytes: 0.01 10*3/uL (ref 0.00–0.07)
Basophils Absolute: 0 10*3/uL (ref 0.0–0.1)
Basophils Relative: 0 %
Eosinophils Absolute: 0 10*3/uL (ref 0.0–0.5)
Eosinophils Relative: 1 %
HCT: 24.4 % — ABNORMAL LOW (ref 36.0–46.0)
Hemoglobin: 7.9 g/dL — ABNORMAL LOW (ref 12.0–15.0)
Immature Granulocytes: 0 %
Lymphocytes Relative: 52 %
Lymphs Abs: 1.3 10*3/uL (ref 0.7–4.0)
MCH: 33.3 pg (ref 26.0–34.0)
MCHC: 32.4 g/dL (ref 30.0–36.0)
MCV: 103 fL — ABNORMAL HIGH (ref 80.0–100.0)
Monocytes Absolute: 0.3 10*3/uL (ref 0.1–1.0)
Monocytes Relative: 12 %
Neutro Abs: 0.9 10*3/uL — ABNORMAL LOW (ref 1.7–7.7)
Neutrophils Relative %: 35 %
Platelets: 51 10*3/uL — ABNORMAL LOW (ref 150–400)
RBC: 2.37 MIL/uL — ABNORMAL LOW (ref 3.87–5.11)
RDW: 17.6 % — ABNORMAL HIGH (ref 11.5–15.5)
WBC: 2.6 10*3/uL — ABNORMAL LOW (ref 4.0–10.5)
nRBC: 0 % (ref 0.0–0.2)

## 2021-07-02 LAB — MAGNESIUM: Magnesium: 1.5 mg/dL — ABNORMAL LOW (ref 1.7–2.4)

## 2021-07-02 MED ORDER — HEPARIN SOD (PORK) LOCK FLUSH 100 UNIT/ML IV SOLN
INTRAVENOUS | Status: AC
  Administered 2021-07-02: 500 [IU]
  Filled 2021-07-02: qty 5

## 2021-07-02 MED ORDER — IOHEXOL 300 MG/ML  SOLN
100.0000 mL | Freq: Once | INTRAMUSCULAR | Status: AC | PRN
  Administered 2021-07-02: 100 mL via INTRAVENOUS

## 2021-07-02 NOTE — Progress Notes (Signed)
Patients port flushed without difficulty.  Good blood return noted with no bruising or swelling noted at site.  Stable during access and blood draw.  Patient to remain accessed for CT scan.   ? ?Discharge from clinic via wheelchair in stable condition.  Alert and oriented X 3.  Follow up with [facility] as scheduled.  ?

## 2021-07-02 NOTE — Patient Instructions (Signed)
[facility]  Discharge Instructions: °Thank you for choosing [facility] to provide your oncology and hematology care.  °If you have a lab appointment with the Cancer Center, please come in thru the Main Entrance and check in at the main information desk. ° °Wear comfortable clothing and clothing appropriate for easy access to any Portacath or PICC line.  ° °We strive to give you quality time with your provider. You may need to reschedule your appointment if you arrive late (15 or more minutes).  Arriving late affects you and other patients whose appointments are after yours.  Also, if you miss three or more appointments without notifying the office, you may be dismissed from the clinic at the provider’s discretion.    °  °For prescription refill requests, have your pharmacy contact our office and allow 72 hours for refills to be completed.   ° °Today you received the following chemotherapy and/or immunotherapy agents PORT flush    °  °To help prevent nausea and vomiting after your treatment, we encourage you to take your nausea medication as directed. ° °BELOW ARE SYMPTOMS THAT SHOULD BE REPORTED IMMEDIATELY: °*FEVER GREATER THAN 100.4 F (38 °C) OR HIGHER °*CHILLS OR SWEATING °*NAUSEA AND VOMITING THAT IS NOT CONTROLLED WITH YOUR NAUSEA MEDICATION °*UNUSUAL SHORTNESS OF BREATH °*UNUSUAL BRUISING OR BLEEDING °*URINARY PROBLEMS (pain or burning when urinating, or frequent urination) °*BOWEL PROBLEMS (unusual diarrhea, constipation, pain near the anus) °TENDERNESS IN MOUTH AND THROAT WITH OR WITHOUT PRESENCE OF ULCERS (sore throat, sores in mouth, or a toothache) °UNUSUAL RASH, SWELLING OR PAIN  °UNUSUAL VAGINAL DISCHARGE OR ITCHING  ° °Items with * indicate a potential emergency and should be followed up as soon as possible or go to the Emergency Department if any problems should occur. ° °Please show the CHEMOTHERAPY ALERT CARD or IMMUNOTHERAPY ALERT CARD at check-in to the Emergency  Department and triage nurse. ° °Should you have questions after your visit or need to cancel or reschedule your appointment, please contact [facility] [phone]  and follow the prompts.  Office hours are 8:00 a.m. to 4:30 p.m. Monday - Friday. Please note that voicemails left after 4:00 p.m. may not be returned until the following business day.  We are closed weekends and major holidays. You have access to a nurse at all times for urgent questions. Please call the main number to the clinic [phone] and follow the prompts. ° °For any non-urgent questions, you may also contact your provider using MyChart. We now offer e-Visits for anyone 18 and older to request care online for non-urgent symptoms. For details visit [email or web address]. °  °Also download the MyChart app! Go to the app store, search "MyChart", open the app, select [facility], and log in with your MyChart username and password. ° °Due to Covid, a mask is required upon entering the hospital/clinic. If you do not have a mask, one will be given to you upon arrival. For doctor visits, patients may have 1 support person aged 18 or older with them. For treatment visits, patients cannot have anyone with them due to current Covid guidelines and our immunocompromised population.  °

## 2021-07-03 ENCOUNTER — Encounter (HOSPITAL_COMMUNITY): Payer: 59

## 2021-07-04 ENCOUNTER — Ambulatory Visit: Payer: 59 | Admitting: Radiation Oncology

## 2021-07-04 ENCOUNTER — Inpatient Hospital Stay: Payer: 59 | Admitting: Dietician

## 2021-07-09 ENCOUNTER — Inpatient Hospital Stay (HOSPITAL_BASED_OUTPATIENT_CLINIC_OR_DEPARTMENT_OTHER): Payer: 59 | Admitting: Hematology

## 2021-07-09 ENCOUNTER — Other Ambulatory Visit: Payer: Self-pay

## 2021-07-09 VITALS — BP 101/70 | HR 100 | Temp 97.7°F | Resp 18 | Ht 62.0 in | Wt 180.0 lb

## 2021-07-09 DIAGNOSIS — E876 Hypokalemia: Secondary | ICD-10-CM | POA: Diagnosis not present

## 2021-07-09 DIAGNOSIS — C541 Malignant neoplasm of endometrium: Secondary | ICD-10-CM | POA: Diagnosis present

## 2021-07-09 DIAGNOSIS — G629 Polyneuropathy, unspecified: Secondary | ICD-10-CM | POA: Insufficient documentation

## 2021-07-09 DIAGNOSIS — D649 Anemia, unspecified: Secondary | ICD-10-CM | POA: Diagnosis not present

## 2021-07-09 MED ORDER — TRAMADOL HCL 50 MG PO TABS
ORAL_TABLET | ORAL | 0 refills | Status: DC
Start: 2021-07-09 — End: 2021-09-12

## 2021-07-09 MED ORDER — MAGNESIUM OXIDE -MG SUPPLEMENT 400 (240 MG) MG PO TABS: 400.0000 mg | ORAL_TABLET | Freq: Three times a day (TID) | ORAL | 3 refills | Status: DC

## 2021-07-09 NOTE — Progress Notes (Signed)
? ?Michele Romero ?[address] ?[place], [place] [address] ? ? ?CLINIC:  ?Medical Oncology/Hematology ? ?PCP:  ?[name], NP (Inactive) ?[address] [place] [address] ?[phone] ? ? ?REASON FOR VISIT:  ?Follow-up for endometrial cancer ? ?PRIOR THERAPY: none ? ?NGS Results: not done ? ?CURRENT THERAPY: Carboplatin and Taxol every 3 weeks ? ?BRIEF ONCOLOGIC HISTORY:  ?Oncology History  ?Endometrial cancer ([place])  ?11/15/2020 Initial Diagnosis  ? Endometrial cancer [facility]) ?  ?01/23/2021 -  Chemotherapy  ? Patient is on Treatment Plan : UTERINE Carboplatin AUC 6 / Paclitaxel q21d  ?   ? ? ?CANCER STAGING: ? Cancer Staging  ?Endometrial cancer ([place]) ?Staging form: Corpus Uteri - Carcinoma and Carcinosarcoma, AJCC 8th Edition ?- Clinical stage from 12/31/2020: FIGO Stage IIIC1 (cT1a, cN57m, cM0) - Unsigned ? ? ?INTERVAL HISTORY:  ?Ms. CDannial Romero a 61y.o. female, returns for routine follow-up of her endometrial cancer. CDeasiahwas last seen on 05/29/2021.  ? ?Today she reports feeling food. 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 Her left hand is drawn, but she is able to use utensils and feed herself. She continues to take Gabapentin; she take 1 pill in the morning and 2 at night. She has not taken magnesium in 1 week, and she continues to take potassium daily. She was unable to go to her last appointment with Dr. TBerline Lopesas she was unable to get transportation. She reports pain in her feet and legs at night. She denies current constipation.  ? ?REVIEW OF SYSTEMS:  ?Review of Systems  ?Constitutional:  Negative for appetite change and fatigue.  ?Respiratory:  Positive for cough.   ?Gastrointestinal:  Negative for constipation.  ?Musculoskeletal:  Positive for arthralgias (5/10 feet).  ?Neurological:  Positive for numbness (hands).  ?All other systems reviewed and are negative. ? ?PAST MEDICAL/SURGICAL HISTORY:  ?Past Medical History:  ?Diagnosis Date  ? Ambulates with  cane 10/31/2020  ? Arthritis   ? RIGHT HIP  ? Asthma   ? Cigarette nicotine dependence without complication [id number] ? Complex tear of medial meniscus of right knee 01/20/2017  ? COPD (chronic obstructive pulmonary disease) ([place]   ? ENDOMETRIAL CANCER 10/31/2020  ? [place] 11/15/2020  ? GERD (gastroesophageal reflux disease)   ? History of radiation therapy   ? HDR [place]achythe[place]03[place]2021  Dr JGery Pray ? Migraines   ? Port-A-Cath in place 01/01/2021  ? ?Past Surgical History:  ?Procedure Laterality Date  ? APPENDECTOMY    ? YRS AGO PER PT ON 10-31-2020  ? BIOPSY  11/29/2019  ? Procedure: BIOPSY;  Surgeon: CMontez Morita DQuillian Quince MD;  [facility];  Service: Gastroenterology;;  ileocecal vlve  ? CHOLECYSTECTOMY    ? YRS AGO PER PT ON 10-31-2020  ? COLONOSCOPY WITH PROPOFOL N/A 11/29/2019  ? Procedure: COLONOSCOPY WITH PROPOFOL;  Surgeon: CHarvel Quale MD;  [facility];  Service: Gastroenterology;  Laterality: N/A;  930  ? HYSTEROSCOPY WITH D & C N/A 10/10/2020  ? Procedure: DILATATION AND CURETTAGE /HYSTEROSCOPY;  Surgeon: EFlorian Buff MD;  Location: [facility];  Service: Gynecology;  Laterality: N/A;  ? IR IMAGING GUIDED PORT INSERTION  01/08/2021  ? KNEE ARTHROPLASTY  2018  ? left knee  ? POLYPECTOMY  11/29/2019  ? Procedure: POLYPECTOMY;  Surgeon: CHarvel Quale MD;  [facility];  Service: Gastroenterology;;  sigmoid colon   ? ROBOTIC ASSISTED TOTAL HYSTERECTOMY WITH BILATERAL  SALPINGO OOPHERECTOMY N/A 11/15/2020  ? Procedure: XI ROBOTIC ASSISTED TOTAL HYSTERECTOMY GREATER THAN TWO HUNDRED AND FIFTY GRAMS WITH BILATERAL SALPINGO OOPHORECTOMY, MINI LAPAROTOMY;  Surgeon: Everitt Amber, MD;  Location: [place];  Service: Gynecology;  Laterality: N/A;  ? SENTINEL NODE BIOPSY N/A 11/15/2020  ? Procedure: SENTINEL NODE BIOPSY;  Surgeon: Everitt Amber, MD;  Location: [facility];  Service:  Gynecology;  Laterality: N/A;  ? TUBAL LIGATION  04/14/1985  ? ? ?SOCIAL HISTORY:  ?Social History  ? ?Socioeconomic History  ? Marital status: Single  ?  Spouse name: Not on file  ? Number of children: 2  ? Years of education: Not on file  ? Highest education level: High school graduate  ?Occupational History  ? Not on file  ?Tobacco Use  ? Smoking status: Former  ?  Packs/day: 0.50  ?  Years: 30.00  ?  Pack years: 15.00  ?  Types: Cigarettes  ?  Quit date: 01/01/2020  ?  Years since quitting: 1.5  ?  Passive exposure: Never  ? Smokeless tobacco: Never  ?Vaping Use  ? Vaping Use: Never used  ?Substance and Sexual Activity  ? Alcohol use: No  ? Drug use: Yes  ?  Frequency: 6.0 times per week  ?  Types: Marijuana  ?  Comment: every now and then  ? Sexual activity: Not Currently  ?  Birth control/protection: Surgical  ?  Comment: tubal  ?Other Topics Concern  ? Not on file  ?Social History Narrative  ? Lives with daughter and 1 grandbaby  ? Son is in [place]  ?   ? Enjoys: sitting outside, music, tv  ?   ? Diet: eats all food groups  ? Caffeine: soda and tea: 2-3 cups daily  ? Water: 2-3 16 oz bottles  ?   ? Wears seat belt   ? Does not use phone while driving  ? Smoke detectors at home   ? No weapons   ? ?Social Determinants of Health  ? ?Financial Resource Strain: High Risk  ? Difficulty of Paying Living Expenses: Hard  ?Food Insecurity: No Food Insecurity  ? Worried About Charity fundraiser in the Last Year: Never true  ? Ran Out of Food in the Last Year: Never true  ?Transportation Needs: Unmet Transportation Needs  ? Lack of Transportation (Medical): Yes  ? Lack of Transportation (Non-Medical): Yes  ?Physical Activity: Not on file  ?Stress: Stress Concern Present  ? Feeling of Stress : To some extent  ?Social Connections: Unknown  ? Frequency of Communication with Friends and Family: More than three times a week  ? Frequency of Social Gatherings with Friends and Family: More than three times a week  ? Attends  Religious Services: More than 4 times per year  ? Active Member of Clubs or Organizations: Not on file  ? Attends Archivist Meetings: Not on file  ? Marital Status: Not on file  ?Intimate Partner Violence: Not on file  ? ? ?FAMILY HISTORY:  ?Family History  ?Problem Relation Age of Onset  ? Diabetes Mother   ? Hypertension Mother   ? Heart disease Mother   ?     CHF  ? Stroke Mother   ? Heart disease Father   ? Colon cancer Brother   ? Breast cancer Neg Hx   ? Ovarian cancer Neg Hx   ? Endometrial cancer Neg Hx   ? Prostate cancer Neg Hx   ?  Pancreatic cancer Neg Hx   ? ? ?CURRENT MEDICATIONS:  ?Current Outpatient Medications  ?Medication Sig Dispense Refill  ? albuterol (VENTOLIN HFA) 108 (90 Base) MCG/ACT inhaler Inhale 2 puffs into the lungs every 6 (six) hours as needed for wheezing or shortness of breath. 8 g 2  ? budesonide-formoterol (SYMBICORT) 80-4.5 MCG/ACT inhaler Inhale 2 puffs into the lungs 2 (two) times daily. 10.2 g 2  ? calcium carbonate (TUMS - DOSED IN MG ELEMENTAL CALCIUM) 500 MG chewable tablet Chew 1 tablet by mouth as needed for indigestion or heartburn.    ? CARBOPLATIN IV Inject into the vein every 21 ( twenty-one) days.    ? gabapentin (NEURONTIN) 300 MG capsule Take 1 capsule (300 mg total) by mouth 3 (three) times daily. Take additional capsule at bed time for total dose '600mg'$  120 capsule 3  ? ibuprofen (ADVIL) 800 MG tablet Take 1 tablet (800 mg total) by mouth every 8 (eight) hours as needed for moderate pain. For AFTER surgery only 30 tablet 0  ? loratadine (CLARITIN) 10 MG tablet Take 1 tablet (10 mg total) by mouth daily. 30 tablet 2  ? magnesium oxide (MAG-OX) 400 (240 Mg) MG tablet Take 1 tablet (400 mg total) by mouth in the morning, at noon, and at bedtime. 90 tablet 3  ? Multiple Vitamin (MULTIVITAMIN WITH MINERALS) TABS tablet Take 1 tablet by mouth daily.    ? ondansetron (ZOFRAN ODT) 8 MG disintegrating tablet Take 1 tablet (8 mg total) by mouth every 8 (eight)  hours as needed for nausea or vomiting. 8 tablet 0  ? PACLITAXEL IV Inject into the vein every 21 ( twenty-one) days.    ? pantoprazole (PROTONIX) 40 MG tablet Take 1 tablet (40 mg total) by mouth daily. 30 tablet 1  ? P

## 2021-07-09 NOTE — Patient Instructions (Signed)
[place] at [facility] ?Discharge Instructions ? ?You were seen and examined today by Dr. Delton Coombes. He reviewed your most recent labs and scan and everything looks stable. Please keep follow up appointments as scheduled in 3 months. ? ? ?Thank you for choosing [place] at [facility] to provide your oncology and hematology care.  To afford each patient quality time with our provider, please arrive at least 15 minutes before your scheduled appointment time.  ? ?If you have a lab appointment with the [facility] please come in thru the Main Entrance and check in at the main information desk. ? ?You need to re-schedule your appointment should you arrive 10 or more minutes late.  We strive to give you quality time with our providers, and arriving late affects you and other patients whose appointments are after yours.  Also, if you no show three or more times for appointments you may be dismissed from the clinic at the providers discretion.     ?Again, thank you for choosing [facility].  Our hope is that these requests will decrease the amount of time that you wait before being seen by our physicians.       ?_____________________________________________________________ ? ?Should you have questions after your visit to [facility], please contact our office at [phone] and follow the prompts.  Our office hours are 8:00 a.m. and 4:30 p.m. Monday - Friday.  Please note that voicemails left after 4:00 p.m. may not be returned until the following business day.  We are closed weekends and major holidays.  You do have access to a nurse 24-7, just call the main number to the clinic [phone] and do not press any options, hold on the line and a nurse will answer the phone.   ? ?For prescription refill requests, have your pharmacy contact our office and allow 72 hours.   ? ?Due to Covid, you will need to wear a mask upon entering the  hospital. If you do not have a mask, a mask will be given to you at the Main Entrance upon arrival. For doctor visits, patients may have 1 support person age 73 or older with them. For treatment visits, patients can not have anyone with them due to social distancing guidelines and our immunocompromised population.  ? ?  ?

## 2021-07-10 ENCOUNTER — Telehealth (HOSPITAL_COMMUNITY): Payer: Self-pay

## 2021-07-10 ENCOUNTER — Ambulatory Visit (HOSPITAL_COMMUNITY): Payer: 59

## 2021-07-10 NOTE — Telephone Encounter (Signed)
Called patient regarding no show. Spoke with Daughter who thinks she couldn't find a ride. Reminder provided for next appointment and to call if she doesn't have a ride and we can reschedule. Daughter verbalized understanding.  ? ?Ailene Ravel, OTR/L,CBIS  ?[phone] ? ?

## 2021-07-11 ENCOUNTER — Telehealth (HOSPITAL_COMMUNITY): Payer: Self-pay | Admitting: Hematology

## 2021-07-11 NOTE — Progress Notes (Signed)
?Radiation Oncology         [phone] ?________________________________ ? ?Name: Michele Romero MRN: [id number]  ?Date: 07/12/2021  DOB: 1960-05-04 ? ?Follow-Up Visit Note ? ?CC: [name], NP (Inactive)  [name], NP ? ?  ICD-10-CM   ?1. Endometrial cancer ([place])  C54.1   ?  ? ? ?Diagnosis:  The encounter diagnosis was Endometrial cancer ([place]). ?  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  ? ?Interval Since Last Radiation: 1 month and 11 days  ? ?Intent: Curative ? ?Radiation Treatment Dates: 05/08/2021 through 06/03/2021 ?Site Technique Total Dose (Gy) Dose per Fx (Gy) Completed Fx Beam Energies  ?Vagina: Pelvis HDR-brachy 30/30 6 5/5 Ir-192  ? ? ?Narrative:  The patient returns today for routine follow-up. Overall she tolerated her brachytherapy well.  She was found to have a vulvovaginal yeast infection and was treated with Diflucan for this issue. ? ?Since she as last seen, the patient completed chemotherapy consisting of carboplatin and taxol on 06/10/21 (6 cycles). During a follow-up visit with Dr. Delton Coombes on 05/29/21, the patient was noted to have worsening neuropathy in her hands, significant enough to the point where she is unable to hold any objects. She also reported continued numbness in her feet, and transitioned to using a wheel chair (given that she is unable to hold onto her walker). Labs performed during this visit also showed a low white count of 3.7 with ANC of 1300, and a low platelet count of 58. Given her symptoms and labs, cycle 5 of her treatment was held pending a re-check of her labs the following week. (Patient is on Gabapentin and magnesium for neuropathy).          ? ?Per her most recent follow-up with Dr. Delton Coombes on 07/09/21, the patient reported ongoing neuropathy in her hands bilaterally. Her left hand was also noted to be drawn, but she reported that she is still able to use utensils and feed herself.                    ? ?Pertinent imaging  performed in the interval includes:  ?-- CT of the abdomen and pelvis on 07/02/21 which showed unchanged prominent bilateral inguinal lymph nodes, noted as nonspecific, and most likely incidental and reactive (nodal metastatic disease not strictly excluded). Otherwise, CT showed no evidence of metastatic disease in the abdomen or pelvis.  ? ?Of note: the patient was unable to meet with Dr. Berline Lopes on 06/21/21 due to transportation issues.  ? ?She reports feeling well at this time other than her issues with neuropathy.  She denies any pain within the pelvis area vaginal bleeding or discharge.  She reports the Diflucan cleared up her yeast infection well.  She does report urinary incontinence which she had prior to her surgery and radiation therapy. ? ?Allergies:  is allergic to codeine. ? ?Meds: ?Current Outpatient Medications  ?Medication Sig Dispense Refill  ? albuterol (VENTOLIN HFA) 108 (90 Base) MCG/ACT inhaler Inhale 2 puffs into the lungs every 6 (six) hours as needed for wheezing or shortness of breath. 8 g 2  ? budesonide-formoterol (SYMBICORT) 80-4.5 MCG/ACT inhaler Inhale 2 puffs into the lungs 2 (two) times daily. 10.2 g 2  ? calcium carbonate (TUMS - DOSED IN MG ELEMENTAL CALCIUM) 500 MG chewable tablet Chew 1 tablet by mouth as needed for indigestion or heartburn.    ? gabapentin (NEURONTIN) 300 MG capsule Take 1 capsule (300 mg total) by mouth  3 (three) times daily. Take additional capsule at bed time for total dose '600mg'$  120 capsule 3  ? ibuprofen (ADVIL) 800 MG tablet Take 1 tablet (800 mg total) by mouth every 8 (eight) hours as needed for moderate pain. For AFTER surgery only 30 tablet 0  ? loratadine (CLARITIN) 10 MG tablet Take 1 tablet (10 mg total) by mouth daily. 30 tablet 2  ? magnesium oxide (MAG-OX) 400 (240 Mg) MG tablet Take 1 tablet (400 mg total) by mouth in the morning, at noon, and at bedtime. 90 tablet 3  ? Multiple Vitamin (MULTIVITAMIN WITH MINERALS) TABS tablet Take 1 tablet by  mouth daily.    ? ondansetron (ZOFRAN ODT) 8 MG disintegrating tablet Take 1 tablet (8 mg total) by mouth every 8 (eight) hours as needed for nausea or vomiting. 8 tablet 0  ? pantoprazole (PROTONIX) 40 MG tablet Take 1 tablet (40 mg total) by mouth daily. 30 tablet 1  ? potassium chloride SA (KLOR-CON M) 20 MEQ tablet Take 1 tablet (20 mEq total) by mouth daily. 30 tablet 3  ? vitamin B-12 (CYANOCOBALAMIN) 500 MCG tablet Take 500 mcg by mouth daily.    ? CARBOPLATIN IV Inject into the vein every 21 ( twenty-one) days. (Patient not taking: Reported on 07/12/2021)    ? PACLITAXEL IV Inject into the vein every 21 ( twenty-one) days. (Patient not taking: Reported on 07/12/2021)    ? Pegfilgrastim-cbqv (UDENYCA [place]) Inject into the skin every 21 ( twenty-one) days. Administer on Day 3 of each chemotherapy cycle (Patient not taking: Reported on 07/12/2021)    ? traMADol (ULTRAM) 50 MG tablet Take 1-2 tablets at bed time as needed (Patient not taking: Reported on 07/12/2021) 60 tablet 0  ? ?No current facility-administered medications for this encounter.  ? ? ?Physical Findings: ?The patient is in no acute distress. Patient is alert and oriented. ? height is '5\' 2"'$  (1.575 m). Her temperature is 97.7 ?F (36.5 ?C). Her blood pressure is 91/67 and her pulse is 93. Her respiration is 20 and oxygen saturation is 100%. .  Lungs are clear to auscultation bilaterally. Heart has regular rate and rhythm. No palpable cervical, supraclavicular, or axillary adenopathy. Abdomen soft, non-tender, normal bowel sounds.  Pelvic exam deferred in light of recent completion of treatment. ? ? ?Lab Findings: ?Lab Results  ?Component Value Date  ? WBC 2.6 (L) 07/02/2021  ? HGB 7.9 (L) 07/02/2021  ? HCT 24.4 (L) 07/02/2021  ? MCV 103.0 (H) 07/02/2021  ? PLT 51 (L) 07/02/2021  ? ? ?Radiographic Findings: ?CT Abdomen Pelvis W Contrast ? ?Result Date: 07/03/2021 ?CLINICAL DATA:  Endometrial cancer, assess treatment response, status post hysterectomy,  chemotherapy, and radiation * Tracking Code: BO * EXAM: CT ABDOMEN AND PELVIS WITH CONTRAST TECHNIQUE: Multidetector CT imaging of the abdomen and pelvis was performed using the standard protocol following bolus administration of intravenous contrast. RADIATION DOSE REDUCTION: This exam was performed according to the departmental dose-optimization program which includes automated exposure control, adjustment of the mA and/or kV according to patient size and/or use of iterative reconstruction technique. CONTRAST:  125m OMNIPAQUE IOHEXOL 300 MG/ML  SOLN COMPARISON:  10/29/2020 FINDINGS: Lower chest: No acute abnormality.  Small hiatal hernia. Hepatobiliary: No focal liver abnormality is seen. Status post cholecystectomy. No biliary dilatation. Pancreas: Unremarkable. No pancreatic ductal dilatation or surrounding inflammatory changes. Spleen: Normal in size without significant abnormality. Adrenals/Urinary Tract: Adrenal glands are unremarkable. Kidneys are normal, without renal calculi, solid lesion, or hydronephrosis. Bladder is  unremarkable. Stomach/Bowel: Stomach is within normal limits. Appendix appears normal. No evidence of bowel wall thickening, distention, or inflammatory changes. Unchanged, incidental, benign intramural lipoma of the distal transverse colon (series 5, image 20). Vascular/Lymphatic: Scattered aortic atherosclerosis. Unchanged prominent bilateral inguinal lymph nodes, measuring up to 1.3 x 1.0 cm on the left (series 3, image 81). Reproductive: Status post interval hysterectomy and bilateral oophorectomy. Other: No abdominal wall hernia or abnormality. No ascites. Musculoskeletal: No acute or significant osseous findings. IMPRESSION: 1. Status post interval hysterectomy and bilateral oophorectomy. 2. Unchanged prominent bilateral inguinal lymph nodes, nonspecific, most likely incidental and reactive. Nodal metastatic disease not strictly excluded. Attention on follow-up. 3. No other evidence  of metastatic disease in the abdomen or pelvis. Aortic Atherosclerosis (ICD10-I70.0). Electronically Signed   By: Delanna Ahmadi M.D.   On: 07/03/2021 20:42   ? ?Impression:  The encounter diagnosis was Endomet

## 2021-07-12 ENCOUNTER — Encounter: Payer: Self-pay | Admitting: Radiation Oncology

## 2021-07-12 ENCOUNTER — Other Ambulatory Visit: Payer: Self-pay

## 2021-07-12 ENCOUNTER — Inpatient Hospital Stay: Payer: 59 | Attending: Dietician | Admitting: Dietician

## 2021-07-12 ENCOUNTER — Ambulatory Visit
Admission: RE | Admit: 2021-07-12 | Discharge: 2021-07-12 | Disposition: A | Discharge status: Home / Self Care | Payer: 59 | Source: Ambulatory Visit | Attending: Radiation Oncology | Admitting: Radiation Oncology

## 2021-07-12 VITALS — BP 91/67 | HR 93 | Temp 97.7°F | Resp 20 | Ht 62.0 in

## 2021-07-12 DIAGNOSIS — G629 Polyneuropathy, unspecified: Secondary | ICD-10-CM | POA: Insufficient documentation

## 2021-07-12 DIAGNOSIS — Z923 Personal history of irradiation: Secondary | ICD-10-CM | POA: Diagnosis not present

## 2021-07-12 DIAGNOSIS — C541 Malignant neoplasm of endometrium: Secondary | ICD-10-CM | POA: Diagnosis present

## 2021-07-12 DIAGNOSIS — Z79899 Other long term (current) drug therapy: Secondary | ICD-10-CM | POA: Insufficient documentation

## 2021-07-12 DIAGNOSIS — Z9049 Acquired absence of other specified parts of digestive tract: Secondary | ICD-10-CM | POA: Diagnosis not present

## 2021-07-12 NOTE — Progress Notes (Signed)
Michele Romero is here today for follow up post radiation to the pelvic. ? ?They completed their radiation on: 06/03/2021  ? ?Does the patient complain of any of the following: ? ?Pain:denies ?Abdominal bloating: denies ?Diarrhea/Constipation: constipated last week, laxative was effective  ?Nausea/Vomiting: denies ?Vaginal Discharge: denies ?Blood in Urine or Stool: denies ?Urinary Issues (dysuria/incomplete emptying/ incontinence/ increased frequency/urgency): incontinence, frequency, nocturia 3-4 ?Does patient report using vaginal dilator 2-3 times a week and/or sexually active 2-3 weeks: education and vaginal dilators given at this visit ?Post radiation skin changes: none ? ? ?Additional comments if applicable: neuropathy in hands and legs/feet. ? ?Vitals:  ? 07/12/21 1029  ?BP: 91/67  ?Pulse: 93  ?Resp: 20  ?Temp: 97.7 ?F (36.5 ?C)  ?SpO2: 100%  ?Height: '5\' 2"'$  (1.575 m)  ? ? ?

## 2021-07-12 NOTE — Progress Notes (Signed)
Patient did not show for scheduled nutrition appointment.  

## 2021-07-17 ENCOUNTER — Ambulatory Visit (HOSPITAL_COMMUNITY): Payer: 59 | Attending: Hematology

## 2021-07-17 ENCOUNTER — Encounter (HOSPITAL_COMMUNITY): Payer: Self-pay

## 2021-07-17 DIAGNOSIS — R29898 Other symptoms and signs involving the musculoskeletal system: Secondary | ICD-10-CM | POA: Diagnosis present

## 2021-07-17 DIAGNOSIS — R208 Other disturbances of skin sensation: Secondary | ICD-10-CM | POA: Insufficient documentation

## 2021-07-17 DIAGNOSIS — R278 Other lack of coordination: Secondary | ICD-10-CM | POA: Insufficient documentation

## 2021-07-17 NOTE — Therapy (Signed)
?OUTPATIENT OCCUPATIONAL THERAPY TREATMENT NOTE ? ? ?Patient Name: Michele Romero ?MRN: [id number] ?DOB:05-Mar-1961, 61 y.o., female ?Today's Date: 07/17/2021 ? ?PCP: [name], NP (Inactive) ?REFERRING PROVIDER: Derek Jack, MD ? ? OT End of Session - 07/17/21 1513   ? ? Visit Number 5   ? Number of Visits 11   ? Date for OT Re-Evaluation 08/28/21   ? Authorization Type [employer]   ? Authorization Time Period no copay. Visit limit: 30 OT/PT/Chiro - 0 used   ? Authorization - Visit Number 5   ? Authorization - Number of Visits 30   ? Activity Tolerance Patient tolerated treatment well   ? Behavior During Therapy [facility] for tasks assessed/performed   ? ?  ?  ? ?  ?Time In: 2:48PM (Pt arrived late) ?Time Out: 3:15PM ?Total time: 27 minutes ? ?Past Medical History:  ?Diagnosis Date  ? Ambulates with cane 10/31/2020  ? Arthritis   ? RIGHT HIP  ? Asthma   ? Cigarette nicotine dependence without complication 40/98/1191  ? Complex tear of medial meniscus of right knee 01/20/2017  ? COPD (chronic obstructive pulmonary disease) ([place])   ? ENDOMETRIAL CANCER 10/31/2020  ? Endometrial cancer ([place]) 11/15/2020  ? GERD (gastroesophageal reflux disease)   ? History of radiation therapy   ? HDR brachytherapy [place] 05/08/2021-06/03/2021  Dr Gery Pray  ? Migraines   ? Port-A-Cath in place 01/01/2021  ? ?Past Surgical History:  ?Procedure Laterality Date  ? APPENDECTOMY    ? YRS AGO PER PT ON 10-31-2020  ? BIOPSY  11/29/2019  ? Procedure: BIOPSY;  Surgeon: Montez Morita, Quillian Quince, MD;  [facility];  Service: Gastroenterology;;  ileocecal vlve  ? CHOLECYSTECTOMY    ? YRS AGO PER PT ON 10-31-2020  ? COLONOSCOPY WITH PROPOFOL N/A 11/29/2019  ? Procedure: COLONOSCOPY WITH PROPOFOL;  Surgeon: Harvel Quale, MD;  [facility];  Service: Gastroenterology;  Laterality: N/A;  930  ? HYSTEROSCOPY WITH D & C N/A 10/10/2020  ? Procedure: DILATATION AND CURETTAGE /HYSTEROSCOPY;  Surgeon: Florian Buff, MD;  [facility]: [facility];  Service: Gynecology;  Laterality: N/A;  ? IR IMAGING GUIDED PORT INSERTION  01/08/2021  ? KNEE ARTHROPLASTY  2018  ? left knee  ? POLYPECTOMY  11/29/2019  ? Procedure: POLYPECTOMY;  Surgeon: Harvel Quale, MD;  [facility];  Service: Gastroenterology;;  sigmoid colon   ? ROBOTIC ASSISTED TOTAL HYSTERECTOMY WITH BILATERAL SALPINGO OOPHERECTOMY N/A 11/15/2020  ? Procedure: XI ROBOTIC ASSISTED TOTAL HYSTERECTOMY GREATER THAN TWO HUNDRED AND FIFTY GRAMS WITH BILATERAL SALPINGO OOPHORECTOMY, MINI LAPAROTOMY;  Surgeon: Everitt Amber, MD;  Location: [place];  Service: Gynecology;  Laterality: N/A;  ? SENTINEL NODE BIOPSY N/A 11/15/2020  ? Procedure: SENTINEL NODE BIOPSY;  Surgeon: Everitt Amber, MD;  [facility]  Service: Gynecology;  Laterality: N/A;  ? TUBAL LIGATION  04/14/1985  ? ?Patient Active Problem List  ? Diagnosis Date Noted  ? Dysuria 05/08/2021  ? Drug-induced neutropenia ([place]) 05/07/2021  ? Port-A-Cath in place 01/01/2021  ? Endometrial cancer ([place]) 11/15/2020  ? PMB (postmenopausal bleeding)   ? Thickened endometrium   ? Endometrial polyp   ? Left ear impacted cerumen 10/09/2020  ? Unilateral primary osteoarthritis, right hip 12/06/2019  ? Right hip pain 10/30/2019  ? Encounter for screening for malignant neoplasm of colon 10/30/2019  ? Encounter for general adult medical examination with abnormal findings 10/30/2019  ? Essential hypertension 10/30/2019  ?  Chronic obstructive pulmonary disease ([place]) 10/28/2019  ? Prediabetes 01/20/2017  ? Osteoarthritis of right knee 01/20/2017  ? Morbid obesity ([employer]) 06/25/2015  ? ? ?ONSET DATE: September 2022 - chemo was started ? 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 ? ?THERAPY DIAG:  ?Other disturbances of skin sensation ? ?Other symptoms and signs involving the musculoskeletal system ? ?Other lack of coordination ? ? ?PERTINENT HISTORY:   ?Patient is a 61 y/o female S/P neuropathy affecting her BUE which started due to chemotherapy for Endometrial cancer. She started chemo September 2022 and has one more round to do. The neuropathy has effected primarily her hands causing increased tone in the right and decreased tone in her left. Full recovery of ROM and strength is not expected although patient would like to gain back some independence as she requires total assist to complete all ADL tasks. Dr. Delton Coombes has referred patient to occupational therapy for evaluation and treatment.  ? ? 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 Active endometrial cancer ? ?SUBJECTIVE: S: I've been working with my hands.  ? ?PAIN:  ?Are you having pain? Yes: NPRS scale: 8/10 ?Pain location: right and left hands ?Pain description: constant, neuropathic pain ?Aggravating factors: touch is more sensitive ?Relieving factors: Nothing ? ? ? ? ?OBJECTIVE:  ? ? ? ?Assessed seated. Wrist originally assessed in gravity eliminated plane at initial evaluation. Today at reassessment, all measurements were taken against gravity unless otherwise marked.  ?Able to make a right hand fist 75% or greater. Unable to register grip strength on dynameter although able to squeeze therapist's hand. Right hand able to extend finger 50% (on eval: ~25%) ?A/ROM Right evaluation 07/17/21  ?Wrist flexion 42 64  ?Wrist extension 40 50  ?Wrist radial deviation 18 20  ?Wrist ulnar deviation 0 18  ?A/ROM Left    ?Wrist flexion 50 74  ?Wrist extension (gravity eliminated plane) 14 34  ?Wrist radial deviation 18 22  ?Wrist ulnar deviation 0 0  ? ? ? ?TODAY'S TREATMENT:  ? ? ?PATIENT EDUCATION: ?Education details:  ?Person educated:  ?IT sales professional:  ?Education comprehension:  ? ? ?[place] ? ? ? OT Short Term Goals - 06/20/21 1405   ? ?  ? OT SHORT TERM GOAL #1  ? Title Patient will be educated and independent with her HEP to work towards increasing the ROM, strength and   functional use of BUE when completing ADL tasks with assist.   ? Time 6   ? Period Weeks   ? Status On-going   ? Target Date 06/28/21   ?  ? OT SHORT TERM GOAL #2  ? Title Patient will increase her BUE A/ROM (wrist and forearm) by 10 degrees or more where lacking in order to increase her participation in dressing tasks with assistance from family.   ? Time 6   ? Period Weeks   ? Status On-going   ?  ? OT SHORT TERM GOAL #3  ? Title Patient will complete self feeding with Supervision/set-up assist with the use of adaptive equipment.   ? Time 6   ? Period Weeks   ? Status MET  ?  ? OT SHORT TERM GOAL #4  ? Title Patient will be educated and verbalize understanding of DME, adaptive equipment, and/or compensatory techniques that will allow her to participate in basic ADL tasks, such as bathing, grooming, and dressing with maximum assistance or less.   ? Time 6   ? Period Weeks   ?  Status On-going   ? ?  ?  ? ?  ? ? ? ? ? Plan - 07/17/21 1521   ? ? Clinical Impression Statement A: Discussed progress in therapy and difficulty consistently attending therapy appointments once a week at times due to transportation issues. Pt reports that she has been using the adaptive equipment such as the universal cuff and the provided built up foam to increase her ability to self feed and brush her teeth. She has not purchased the plate guard at this time although plans to. She also confirms that she has been using the voice commands on her phone for hands free use when possible. ROM of both wrists has improved even though OT has not be able to address yet. Pt continues to have limitations with bilateral hands and wrist regarding A/ROM. Recommend continuing skilled OT services to further help with increasing independence with ADL tasks and increase ROM and functional use of her hands.    ? Body Structure / Function / Physical Skills ADL;UE functional use;Balance;Pain;FMC;ROM;Coordination;GMC;Sensation;Edema;Mobility;Tone;IADL;Strength    ? OT Frequency 1x / week   ? OT Duration 6 weeks   ? Plan P: Focus on wrist and hand ROM. Provide HEP.   ? Consulted and Agree with Plan of Care Patient   ? ?  ?  ? ?  ? ? ?Ailene Ravel, OTR/L,CBIS  ?[phone]

## 2021-07-18 ENCOUNTER — Telehealth: Payer: Self-pay | Admitting: *Deleted

## 2021-07-18 NOTE — Telephone Encounter (Signed)
Called patient to inform of fu appt. with Dr. Berline Lopes on 04-19-21- arrival time- 1 pm, lvm for a return call ?

## 2021-08-06 DIAGNOSIS — Z029 Encounter for administrative examinations, unspecified: Secondary | ICD-10-CM

## 2021-08-07 ENCOUNTER — Ambulatory Visit (HOSPITAL_COMMUNITY): Payer: 59

## 2021-08-07 ENCOUNTER — Encounter (HOSPITAL_COMMUNITY): Payer: Self-pay

## 2021-08-07 DIAGNOSIS — R29898 Other symptoms and signs involving the musculoskeletal system: Secondary | ICD-10-CM

## 2021-08-07 DIAGNOSIS — R208 Other disturbances of skin sensation: Secondary | ICD-10-CM | POA: Diagnosis not present

## 2021-08-07 DIAGNOSIS — R278 Other lack of coordination: Secondary | ICD-10-CM

## 2021-08-07 NOTE — Therapy (Signed)
?OUTPATIENT OCCUPATIONAL THERAPY TREATMENT NOTE ? ? ?Patient Name: Michele Romero ?MRN: [id number] ?DOB:[birth year], 61 y.o., female ?Today's Date: 08/12/2021 ? ?PCP: [name], NP ?REFERRING PROVIDER: Derek Jack, MD ? ? OT End of Session - 08/12/21 2056   ? ? Visit Number 6   ? Number of Visits 11   ? Date for OT Re-Evaluation 08/28/21   ? Authorization Type [employer]   ? Authorization Time Period no copay. Visit limit: 30 OT/PT/Chiro - 0 used   ? Authorization - Visit Number 6   ? Authorization - Number of Visits 30   ? OT Start Time 1430   ? OT Stop Time 1508   ? OT Time Calculation (min) 38 min   ? Activity Tolerance Patient tolerated treatment well   ? Behavior During Therapy [facility] for tasks assessed/performed   ? ?  ?  ? ?  ? ? ? ?Past Medical History:  ?Diagnosis Date  ? Ambulates with cane 10/31/2020  ? Arthritis   ? RIGHT HIP  ? Asthma   ? Cigarette nicotine dependence without complication 01/65/5374  ? Complex tear of medial meniscus of right knee 01/20/2017  ? COPD (chronic obstructive pulmonary disease) ([place])   ? ENDOMETRIAL CANCER 10/31/2020  ? Endometrial cancer ([place]) 11/15/2020  ? GERD (gastroesophageal reflux disease)   ? History of radiation therapy   ? HDR brachytherapy [place] 05/08/2021-06/03/2021  Dr Gery Pray  ? Migraines   ? Port-A-Cath in place 01/01/2021  ? ?Past Surgical History:  ?Procedure Laterality Date  ? APPENDECTOMY    ? YRS AGO PER PT ON 10-31-2020  ? BIOPSY  11/29/2019  ? Procedure: BIOPSY;  Surgeon: Montez Morita, Quillian Quince, MD;  [facility];  Service: Gastroenterology;;  ileocecal vlve  ? CHOLECYSTECTOMY    ? YRS AGO PER PT ON 10-31-2020  ? COLONOSCOPY WITH PROPOFOL N/A 11/29/2019  ? Procedure: COLONOSCOPY WITH PROPOFOL;  Surgeon: Harvel Quale, MD;  [facility];  Service: Gastroenterology;  Laterality: N/A;  930  ? HYSTEROSCOPY WITH D & C N/A 10/10/2020  ? Procedure: DILATATION AND CURETTAGE /HYSTEROSCOPY;  Surgeon: Florian Buff, MD;  [facility];  Service: Gynecology;  Laterality: N/A;  ? IR IMAGING GUIDED PORT INSERTION  01/08/2021  ? KNEE ARTHROPLASTY  2018  ? left knee  ? POLYPECTOMY  11/29/2019  ? Procedure: POLYPECTOMY;  Surgeon: Harvel Quale, MD;  [facility];  Service: Gastroenterology;;  sigmoid colon   ? ROBOTIC ASSISTED TOTAL HYSTERECTOMY WITH BILATERAL SALPINGO OOPHERECTOMY N/A 11/15/2020  ? Procedure: XI ROBOTIC ASSISTED TOTAL HYSTERECTOMY GREATER THAN TWO HUNDRED AND FIFTY GRAMS WITH BILATERAL SALPINGO OOPHORECTOMY, MINI LAPAROTOMY;  Surgeon: Everitt Amber, MD;  Location: [place];  Service: Gynecology;  Laterality: N/A;  ? SENTINEL NODE BIOPSY N/A 11/15/2020  ? Procedure: SENTINEL NODE BIOPSY;  Surgeon: Everitt Amber, MD;  Location: [facility];  Service: Gynecology;  Laterality: N/A;  ? TUBAL LIGATION  04/14/1985  ? ?Patient Active Problem List  ? Diagnosis Date Noted  ? Dysuria 05/08/2021  ? Drug-induced neutropenia ([place]) 05/07/2021  ? Port-A-Cath in place 01/01/2021  ? Endometrial cancer ([place]) 11/15/2020  ? PMB (postmenopausal bleeding)   ? Thickened endometrium   ? Endometrial polyp   ? Left ear impacted cerumen 10/09/2020  ? Unilateral primary osteoarthritis, right hip 12/06/2019  ? Right hip pain 10/30/2019  ? Encounter for screening for malignant neoplasm of colon 10/30/2019  ? Encounter for general adult  medical examination with abnormal findings 10/30/2019  ? Essential hypertension 10/30/2019  ? Chronic obstructive pulmonary disease ([place]) 10/28/2019  ? Prediabetes 01/20/2017  ? Osteoarthritis of right knee 01/20/2017  ? Morbid obesity ([place]) 06/25/2015  ? ? ?ONSET DATE: September 2022 - chemo was started ? 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 ? ?THERAPY DIAG:  ?Other disturbances of skin sensation ? ?Other symptoms and signs involving the musculoskeletal system ? ?Other lack of coordination ? ? ?PERTINENT HISTORY:   ?Patient is a 61 y/o female S/P neuropathy affecting her BUE which started due to chemotherapy for Endometrial cancer. She started chemo September 2022 and has one more round to do. The neuropathy has effected primarily her hands causing increased tone in the right and decreased tone in her left. Full recovery of ROM and strength is not expected although patient would like to gain back some independence as she requires total assist to complete all ADL tasks. Dr. Delton Coombes has referred patient to occupational therapy for evaluation and treatment.  ? ? 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 Active endometrial cancer ? ?SUBJECTIVE: S: I've been working with my hands.  ? ?PAIN:  ?Are you having pain? Yes: NPRS scale: 8/10 ?Pain location: right and left hands ?Pain description: constant, neuropathic pain ?Aggravating factors: touch is more sensitive ?Relieving factors: Nothing ? ? ? ? ?OBJECTIVE:  ? ? ? ?Assessed seated. Wrist originally assessed in gravity eliminated plane at initial evaluation. Today at reassessment, all measurements were taken against gravity unless otherwise marked.  ?Able to make a right hand fist 75% or greater. Unable to register grip strength on dynameter although able to squeeze therapist's hand. Right hand able to extend finger 50% (on eval: ~25%) ?A/ROM Right evaluation 07/17/21  ?Wrist flexion 42 64  ?Wrist extension 40 50  ?Wrist radial deviation 18 20  ?Wrist ulnar deviation 0 18  ?A/ROM Left    ?Wrist flexion 50 74  ?Wrist extension (gravity eliminated plane) 14 34  ?Wrist radial deviation 18 22  ?Wrist ulnar deviation 0 0  ? ? ? ?TODAY'S TREATMENT: \ ?08/07/21 ?- Theraputty: yellow, squeeze 1', lateral pinch 1', 3 point pinch 1', right hand only with set-up ?- Stretch: left hand composite finger extension stretch using table and assist from right hand; hold 10", 3 sets. ? ? ?PATIENT EDUCATION: ?Education details: Finger extension stretch, coordination activity, yellow  theraputty- hand strengthening ?Person educated: patient ?Education method: verbalized, demonstrated, handout ?Education comprehension: verbalized and demonstrated understanding ? ? ?[place] ?4/26: finger extension stretch, coordination activity, yellow theraputty ? ? OT Short Term Goals - 06/20/21 1405   ? ?  ? OT SHORT TERM GOAL #1  ? Title Patient will be educated and independent with her HEP to work towards increasing the ROM, strength and  functional use of BUE when completing ADL tasks with assist.   ? Time 6   ? Period Weeks   ? Status On-going   ? Target Date 06/28/21   ?  ? OT SHORT TERM GOAL #2  ? Title Patient will increase her BUE A/ROM (wrist and forearm) by 10 degrees or more where lacking in order to increase her participation in dressing tasks with assistance from family.   ? Time 6   ? Period Weeks   ? Status On-going   ?  ? OT SHORT TERM GOAL #3  ? Title Patient will complete self feeding with Supervision/set-up assist with the use of adaptive equipment.   ? Time  6   ? Period Weeks   ? Status MET  ?  ? OT SHORT TERM GOAL #4  ? Title Patient will be educated and verbalize understanding of DME, adaptive equipment, and/or compensatory techniques that will allow her to participate in basic ADL tasks, such as bathing, grooming, and dressing with maximum assistance or less.   ? Time 6   ? Period Weeks   ? Status On-going   ? ?  ?  ? ?  ? ?ASSESSMENT: ?  ?CLINICAL IMPRESSION: A:Focused session on hand and wrist ROM and strength while establishing a HEP. Assessed right and left hand ability and discussed how to  make both hands as functional as possible. No active left wrist extension is demonstrated although recommended fabricating a custom splint to provide stability and support to her left wrist which will prevent overstretching of her wrist and forearm, help prevent pain from a non preferred position and will also encourage more active use and functional use. Due to increased composite  right hand finger flexion, recommended fabricating a night time splint to allow her right hand/finger to be in a resting extension position and help decrease increased tone from occurring. Pt was in agreement with r

## 2021-08-07 NOTE — Patient Instructions (Addendum)
Theraputty Home Exercise Program ? ?Complete 1-2 times a day. ? ?putty squeeze ? ?Pt. should squeeze putty in hand trying to keep it round by rotating putty after each squeeze. push fingers through putty to palm each time. Complete for ___1___ minutes. *progress your time when able. ? ? ?PUTTY KEY GRIP ? ?Hold the putty at the top of your hand. Squeeze the putty between your thumb and the side of your 2nd finger as shown. Complete for ___1_____ minutes. *progress your time when able. ? ? ? ?PUTTY 3 JAW CHUCK ? ?Roll up some putty into a ball then flatten it. Then, firmly squeeze it with your first 3 fingers as shown. Complete for __1____ minutes. *progress your time when able. ? ? ? ?Daily Coordination activity - right hand ?Using foam shapes: ?1) Use thumb and index finger to pick up each shape one at time and place in a container/cup. When able, work on speed.  ?2) Stack each shape (except triangle) into a tower. ?3) Pick up two shapes.Marland Kitchenone at a time.  ? ? ? ? ? ?General Finger Flexion/Fist (For Left hand) ? ?Close the hand into a fist as far as possible.  Use the thumb of the opposite hand to push the fingers into a tighter grip.  Let pain be your guide stretching as aggressively as you can. Complete 10 times. 2-3 times a day or more.  ? ? ?Finger extension in Wrist Neutral - Right hand on table ? ?Place your hand flat on the table. Use your other hand to press and keep your fingers flat. Keeping your wrist straight (in neutral) lift your elbow and palm up until you feel a stretch.   Hold for 10 seconds. Complete 10 times.  ? ? ?  ?

## 2021-08-13 ENCOUNTER — Telehealth: Payer: Self-pay | Admitting: *Deleted

## 2021-08-13 ENCOUNTER — Ambulatory Visit (HOSPITAL_COMMUNITY): Payer: 59 | Attending: Hematology

## 2021-08-13 ENCOUNTER — Encounter (HOSPITAL_COMMUNITY): Payer: Self-pay

## 2021-08-13 DIAGNOSIS — R29898 Other symptoms and signs involving the musculoskeletal system: Secondary | ICD-10-CM | POA: Insufficient documentation

## 2021-08-13 DIAGNOSIS — R278 Other lack of coordination: Secondary | ICD-10-CM | POA: Insufficient documentation

## 2021-08-13 DIAGNOSIS — R208 Other disturbances of skin sensation: Secondary | ICD-10-CM | POA: Diagnosis not present

## 2021-08-13 NOTE — Telephone Encounter (Signed)
Per 5/2 in basket called and spoke to pt about SCP appointment.  Pt confirmed appointment  ?

## 2021-08-13 NOTE — Patient Instructions (Signed)
Your Splint ?This splint should initially be fitted by a healthcare practitioner.  The healthcare practitioner is responsible for providing wearing instructions and precautions to the patient, other healthcare practitioners and care provider involved in the patient's care.  This splint was custom made for you. Please read the following instructions to learn about wearing and caring for your splint. ? ?Precautions ?Should your splint cause any of the following problems, remove the splint immediately and contact your therapist/physician. ?Swelling ?Severe Pain ?Pressure Areas ?Stiffness ?Numbness ? ?Do not wear your splint while operating machinery unless it has been fabricated for that purpose. ? ?When To Wear Your Splint - for the left hand.  ?Where your splint according to your therapist/physician instructions. ?Daytime hours ? ?Care and Cleaning of Your Splint ?Keep your splint away from open flames. ?Your splint will lose its shape in temperatures over 135 degrees Farenheit, ( in car windows, near radiators, ovens or in hot water).  Never make any adjustments to your splint, if the splint needs adjusting remove it and make an appointment to see your therapist. ?Your splint, including the cushion liner may be cleaned with soap and lukewarm water.  Do not immerse in hot water over 135 degrees Farenheit. ?Straps may be washed with soap and water, but do not moisten the self-adhesive portion. ?For ink or hard to remove spots use a scouring cleanser which contains chlorine.  Rinse the splint thoroughly after using chlorine cleanser. ? ?

## 2021-08-13 NOTE — Therapy (Signed)
?OUTPATIENT OCCUPATIONAL THERAPY TREATMENT NOTE ? ? ?Patient Name: Michele Romero ?MRN: [id number] ?DOB:05-06-1960, 61 y.o., female ?Today's Date: 08/19/2021 ? ?PCP: [name], NP ?REFERRING PROVIDER: Derek Jack, MD ? ? OT End of Session - 08/19/21 1811   ? ? Visit Number 7   ? Number of Visits 11   ? Date for OT Re-Evaluation 08/28/21   ? Authorization Type [employer]   ? Authorization Time Period no copay. Visit limit: 30 OT/PT/Chiro - 0 used   ? Authorization - Visit Number 7   ? Authorization - Number of Visits 30   ? OT Start Time 5102   ? OT Stop Time 1602   ? OT Time Calculation (min) 45 min   ? Activity Tolerance Patient tolerated treatment well   ? Behavior During Therapy [facility] for tasks assessed/performed   ? ?  ?  ? ?  ? ? ? ? ?Past Medical History:  ?Diagnosis Date  ? Ambulates with cane 10/31/2020  ? Arthritis   ? RIGHT HIP  ? Asthma   ? Cigarette nicotine dependence without complication 58/52/7782  ? Complex tear of medial meniscus of right knee 01/20/2017  ? COPD (chronic obstructive pulmonary disease) ([place])   ? ENDOMETRIAL CANCER 10/31/2020  ? Endometrial cancer ([place]) 11/15/2020  ? GERD (gastroesophageal reflux disease)   ? History of radiation therapy   ? HDR brachytherapy [place] 05/08/2021-06/03/2021  Dr Gery Pray  ? Migraines   ? Port-A-Cath in place 01/01/2021  ? ?Past Surgical History:  ?Procedure Laterality Date  ? APPENDECTOMY    ? YRS AGO PER PT ON 10-31-2020  ? BIOPSY  11/29/2019  ? Procedure: BIOPSY;  Surgeon: Montez Morita, Quillian Quince, MD;  [facility];  Service: Gastroenterology;;  ileocecal vlve  ? CHOLECYSTECTOMY    ? YRS AGO PER PT ON 10-31-2020  ? COLONOSCOPY WITH PROPOFOL N/A 11/29/2019  ? Procedure: COLONOSCOPY WITH PROPOFOL;  Surgeon: Harvel Quale, MD;  [facility];  Service: Gastroenterology;  Laterality: N/A;  930  ? HYSTEROSCOPY WITH D & C N/A 10/10/2020  ? Procedure: DILATATION AND CURETTAGE /HYSTEROSCOPY;  Surgeon: Florian Buff, MD;  [facility]: [facility];  Service: Gynecology;  Laterality: N/A;  ? IR IMAGING GUIDED PORT INSERTION  01/08/2021  ? KNEE ARTHROPLASTY  2018  ? left knee  ? POLYPECTOMY  11/29/2019  ? Procedure: POLYPECTOMY;  Surgeon: Harvel Quale, MD;  [facility];  Service: Gastroenterology;;  sigmoid colon   ? ROBOTIC ASSISTED TOTAL HYSTERECTOMY WITH BILATERAL SALPINGO OOPHERECTOMY N/A 11/15/2020  ? Procedure: XI ROBOTIC ASSISTED TOTAL HYSTERECTOMY GREATER THAN TWO HUNDRED AND FIFTY GRAMS WITH BILATERAL SALPINGO OOPHORECTOMY, MINI LAPAROTOMY;  Surgeon: Everitt Amber, MD;  Location: [place];  Service: Gynecology;  Laterality: N/A;  ? SENTINEL NODE BIOPSY N/A 11/15/2020  ? Procedure: SENTINEL NODE BIOPSY;  Surgeon: Everitt Amber, MD;  Location: [facility];  Service: Gynecology;  Laterality: N/A;  ? TUBAL LIGATION  04/14/1985  ? ?Patient Active Problem List  ? Diagnosis Date Noted  ? Dysuria 05/08/2021  ? Drug-induced neutropenia ([place]) 05/07/2021  ? Port-A-Cath in place 01/01/2021  ? Endometrial cancer ([place]) 11/15/2020  ? PMB (postmenopausal bleeding)   ? Thickened endometrium   ? Endometrial polyp   ? Left ear impacted cerumen 10/09/2020  ? Unilateral primary osteoarthritis, right hip 12/06/2019  ? Right hip pain 10/30/2019  ? Encounter for screening for malignant neoplasm of colon 10/30/2019  ? Encounter for general  adult medical examination with abnormal findings 10/30/2019  ? Essential hypertension 10/30/2019  ? Chronic obstructive pulmonary disease ([place]) 10/28/2019  ? Prediabetes 01/20/2017  ? Osteoarthritis of right knee 01/20/2017  ? Morbid obesity ([place]) 06/25/2015  ? ? ?ONSET DATE: September 2022 - chemo was started ? 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 ? ?THERAPY DIAG:  ?Other disturbances of skin sensation ? ?Other symptoms and signs involving the musculoskeletal system ? ?Other lack of coordination ? ? ?PERTINENT HISTORY:   ?Patient is a 61 y/o female S/P neuropathy affecting her BUE which started due to chemotherapy for Endometrial cancer. She started chemo September 2022 and has one more round to do. The neuropathy has effected primarily her hands causing increased tone in the right and decreased tone in her left. Full recovery of ROM and strength is not expected although patient would like to gain back some independence as she requires total assist to complete all ADL tasks. Dr. Delton Coombes has referred patient to occupational therapy for evaluation and treatment.  ? ? 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 Active endometrial cancer ? ?SUBJECTIVE: S: I've been working on my exercises. ? ?PAIN:  ?Are you having pain? Yes: NPRS scale: 7/10 ?Pain location: right and left hands ?Pain description: constant, sore ?Aggravating factors: worse at night ?Relieving factors: Nothing ? ? ? ? ?OBJECTIVE:  ? ? ? ?Assessed seated. Wrist originally assessed in gravity eliminated plane at initial evaluation. Today at reassessment, all measurements were taken against gravity unless otherwise marked.  ?Able to make a right hand fist 75% or greater. Unable to register grip strength on dynameter although able to squeeze therapist's hand. Right hand able to extend finger 50% (on eval: ~25%) ?A/ROM Right evaluation 07/17/21  ?Wrist flexion 42 64  ?Wrist extension 40 50  ?Wrist radial deviation 18 20  ?Wrist ulnar deviation 0 18  ?A/ROM Left    ?Wrist flexion 50 74  ?Wrist extension (gravity eliminated plane) 14 34  ?Wrist radial deviation 18 22  ?Wrist ulnar deviation 0 0  ? ? ? ?TODAY'S TREATMENT: ? ?08/13/21 ?- Splinting: Custom fabricated wrist brace made. Three 2 inch straps used to secure the forearm and MCP joints of 2nd-5th digits.  ? ?08/07/21 ?- Theraputty: yellow, squeeze 1', lateral pinch 1', 3 point pinch 1', right hand only with set-up ?- Stretch: left hand composite finger extension stretch using table and assist from right hand;  hold 10", 3 sets. ? ? ?PATIENT EDUCATION: ?Education details: Splint education provided: wearing schedule, donning/doffing, cleaning, precautions ?Person educated: patient ?Education method: verbalized, handout ?Education comprehension: verbalized understanding ? ? ?[place] ?4/26: finger extension stretch, coordination activity, yellow theraputty ? ? OT Short Term Goals - 06/20/21 1405   ? ?  ? OT SHORT TERM GOAL #1  ? Title Patient will be educated and independent with her HEP to work towards increasing the ROM, strength and  functional use of BUE when completing ADL tasks with assist.   ? Time 6   ? Period Weeks   ? Status On-going   ? Target Date 06/28/21   ?  ? OT SHORT TERM GOAL #2  ? Title Patient will increase her BUE A/ROM (wrist and forearm) by 10 degrees or more where lacking in order to increase her participation in dressing tasks with assistance from family.   ? Time 6   ? Period Weeks   ? Status On-going   ?  ? OT SHORT TERM GOAL #3  ?  Title Patient will complete self feeding with Supervision/set-up assist with the use of adaptive equipment.   ? Time 6   ? Period Weeks   ? Status MET  ?  ? OT SHORT TERM GOAL #4  ? Title Patient will be educated and verbalize understanding of DME, adaptive equipment, and/or compensatory techniques that will allow her to participate in basic ADL tasks, such as bathing, grooming, and dressing with maximum assistance or less.   ? Time 6   ? Period Weeks   ? Status On-going   ? ?  ?  ? ?  ? ?ASSESSMENT: ?  ?CLINICAL IMPRESSION: A: Custom wrist splint fabricated during session for left hand to allow wrist to be supported in a functional resting position that will allow for greater finger use when achieved. Splint will also help prevent wrist extensor stretching and wrist flexor tightening due to lack of muscle strength to maintain independently. ? ?PLAN: ? ? ?OT FREQUENCY: 1x/week ? ?OT DURATION: 6 weeks ? ?PLANNED INTERVENTIONS: self care/ADL training,  therapeutic exercise, therapeutic activity, neuromuscular re-education, manual therapy, passive range of motion, functional mobility training, electrical stimulation, ultrasound, patient/family education, coping str

## 2021-08-22 ENCOUNTER — Ambulatory Visit (HOSPITAL_COMMUNITY): Payer: 59

## 2021-08-22 DIAGNOSIS — R29898 Other symptoms and signs involving the musculoskeletal system: Secondary | ICD-10-CM

## 2021-08-22 DIAGNOSIS — R208 Other disturbances of skin sensation: Secondary | ICD-10-CM | POA: Diagnosis not present

## 2021-08-22 DIAGNOSIS — R278 Other lack of coordination: Secondary | ICD-10-CM

## 2021-08-25 ENCOUNTER — Encounter (HOSPITAL_COMMUNITY): Payer: Self-pay

## 2021-08-25 NOTE — Therapy (Signed)
?OUTPATIENT OCCUPATIONAL THERAPY TREATMENT NOTE ? ? ?Patient Name: Michele Romero ?MRN: [id number] ?DOB:10/06/1960, 61 y.o., female ?Today's Date: 08/25/2021 ? ?PCP: [name], NP ?REFERRING PROVIDER: Derek Jack, MD ? ? OT End of Session - 08/25/21 1838   ? ? Visit Number 8   ? Number of Visits 11   ? Date for OT Re-Evaluation 08/28/21   ? Authorization Type [employer]   ? Authorization Time Period no copay. Visit limit: 30 OT/PT/Chiro - 0 used   ? Authorization - Visit Number 8   ? Authorization - Number of Visits 30   ? OT Start Time 1431   ? OT Stop Time 1540   ? OT Time Calculation (min) 69 min   ? Activity Tolerance Patient tolerated treatment well   ? Behavior During Therapy [facility] for tasks assessed/performed   ? ?  ?  ? ?  ? ? ? ? ? ?Past Medical History:  ?Diagnosis Date  ? Ambulates with cane 10/31/2020  ? Arthritis   ? RIGHT HIP  ? Asthma   ? Cigarette nicotine dependence without complication 16/60/6301  ? Complex tear of medial meniscus of right knee 01/20/2017  ? COPD (chronic obstructive pulmonary disease) ([place])   ? ENDOMETRIAL CANCER 10/31/2020  ? Endometrial cancer ([place]) 11/15/2020  ? GERD (gastroesophageal reflux disease)   ? History of radiation therapy   ? HDR brachytherapy [place] 05/08/2021-06/03/2021  Dr Gery Pray  ? Migraines   ? Port-A-Cath in place 01/01/2021  ? ?Past Surgical History:  ?Procedure Laterality Date  ? APPENDECTOMY    ? YRS AGO PER PT ON 10-31-2020  ? BIOPSY  11/29/2019  ? Procedure: BIOPSY;  Surgeon: Montez Morita, Quillian Quince, MD;  [facility];  Service: Gastroenterology;;  ileocecal vlve  ? CHOLECYSTECTOMY    ? YRS AGO PER PT ON 10-31-2020  ? COLONOSCOPY WITH PROPOFOL N/A 11/29/2019  ? Procedure: COLONOSCOPY WITH PROPOFOL;  Surgeon: Harvel Quale, MD;  [facility];  Service: Gastroenterology;  Laterality: N/A;  930  ? HYSTEROSCOPY WITH D & C N/A 10/10/2020  ? Procedure: DILATATION AND CURETTAGE /HYSTEROSCOPY;  Surgeon: Florian Buff, MD;  [facility];  Service: Gynecology;  Laterality: N/A;  ? IR IMAGING GUIDED PORT INSERTION  01/08/2021  ? KNEE ARTHROPLASTY  2018  ? left knee  ? POLYPECTOMY  11/29/2019  ? Procedure: POLYPECTOMY;  Surgeon: Harvel Quale, MD;  [facility];  Service: Gastroenterology;;  sigmoid colon   ? ROBOTIC ASSISTED TOTAL HYSTERECTOMY WITH BILATERAL SALPINGO OOPHERECTOMY N/A 11/15/2020  ? Procedure: XI ROBOTIC ASSISTED TOTAL HYSTERECTOMY GREATER THAN TWO HUNDRED AND FIFTY GRAMS WITH BILATERAL SALPINGO OOPHORECTOMY, MINI LAPAROTOMY;  Surgeon: Everitt Amber, MD;  Location: [place];  Service: Gynecology;  Laterality: N/A;  ? SENTINEL NODE BIOPSY N/A 11/15/2020  ? Procedure: SENTINEL NODE BIOPSY;  Surgeon: Everitt Amber, MD;  Location: [facility];  Service: Gynecology;  Laterality: N/A;  ? TUBAL LIGATION  04/14/1985  ? ?Patient Active Problem List  ? Diagnosis Date Noted  ? Dysuria 05/08/2021  ? Drug-induced neutropenia ([place]) 05/07/2021  ? Port-A-Cath in place 01/01/2021  ? Endometrial cancer ([place]) 11/15/2020  ? PMB (postmenopausal bleeding)   ? Thickened endometrium   ? Endometrial polyp   ? Left ear impacted cerumen 10/09/2020  ? Unilateral primary osteoarthritis, right hip 12/06/2019  ? Right hip pain 10/30/2019  ? Encounter for screening for malignant neoplasm of colon 10/30/2019  ? Encounter for  general adult medical examination with abnormal findings 10/30/2019  ? Essential hypertension 10/30/2019  ? Chronic obstructive pulmonary disease ([place]) 10/28/2019  ? Prediabetes 01/20/2017  ? Osteoarthritis of right knee 01/20/2017  ? Morbid obesity ([place]) 06/25/2015  ? ? ?ONSET DATE: September 2022 - chemo was started ? 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 ? ?THERAPY DIAG:  ?Other symptoms and signs involving the musculoskeletal system ? ?Other disturbances of skin sensation ? ?Other lack of coordination ? ? ?PERTINENT HISTORY:   ?Patient is a 60 y/o female S/P neuropathy affecting her BUE which started due to chemotherapy for Endometrial cancer. She started chemo September 2022 and has one more round to do. The neuropathy has effected primarily her hands causing increased tone in the right and decreased tone in her left. Full recovery of ROM and strength is not expected although patient would like to gain back some independence as she requires total assist to complete all ADL tasks. Dr. Delton Coombes has referred patient to occupational therapy for evaluation and treatment.  ? ? 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 Active endometrial cancer ? ?SUBJECTIVE: S: I like the splint.  ? ?PAIN:  ?Are you having pain? Yes: NPRS scale: 7/10 ?Pain location: right and left hands ?Pain description: constant, sore ?Aggravating factors: worse at night ?Relieving factors: Nothing ? ? ? ? ?OBJECTIVE:  ? ? ? ?Assessed seated. Wrist originally assessed in gravity eliminated plane at initial evaluation. Today at reassessment, all measurements were taken against gravity unless otherwise marked.  ?Able to make a right hand fist 75% or greater. Unable to register grip strength on dynameter although able to squeeze therapist's hand. Right hand able to extend finger 50% (on eval: ~25%) ?A/ROM Right evaluation 07/17/21  ?Wrist flexion 42 64  ?Wrist extension 40 50  ?Wrist radial deviation 18 20  ?Wrist ulnar deviation 0 18  ?A/ROM Left    ?Wrist flexion 50 74  ?Wrist extension (gravity eliminated plane) 14 34  ?Wrist radial deviation 18 22  ?Wrist ulnar deviation 0 0  ? ? ? ?TODAY'S TREATMENT: ?08/22/21 ?- Splinting: Custom fabricated left hand composite finger extension splint made. Three 2 inch straps used to secure the forearm and PIP joints of 2nd-5th digits. One 1 inch strap placed to secure thumb.  ? Adjustments made to wrist brace fabricated at previous session.  ? ?08/13/21 ?- Splinting: Custom fabricated wrist brace made. Three 2 inch straps used  to secure the forearm and MCP joints of 2nd-5th digits.  ? ?08/07/21 ?- Theraputty: yellow, squeeze 1', lateral pinch 1', 3 point pinch 1', right hand only with set-up ?- Stretch: left hand composite finger extension stretch using table and assist from right hand; hold 10", 3 sets. ? ? ?PATIENT EDUCATION: ?Education details: Splint education provided: wearing schedule, donning/doffing, cleaning, precautions ?Person educated: patient ?Education method: verbalized, handout ?Education comprehension: verbalized understanding ? ? ?[place] ?4/26: finger extension stretch, coordination activity, yellow theraputty ? ? OT Short Term Goals - 06/20/21 1405   ? ?  ? OT SHORT TERM GOAL #1  ? Title Patient will be educated and independent with her HEP to work towards increasing the ROM, strength and  functional use of BUE when completing ADL tasks with assist.   ? Time 6   ? Period Weeks   ? Status On-going   ? Target Date 06/28/21   ?  ? OT SHORT TERM GOAL #2  ? Title Patient will increase her BUE A/ROM (wrist and forearm)  by 10 degrees or more where lacking in order to increase her participation in dressing tasks with assistance from family.   ? Time 6   ? Period Weeks   ? Status On-going   ?  ? OT SHORT TERM GOAL #3  ? Title Patient will complete self feeding with Supervision/set-up assist with the use of adaptive equipment.   ? Time 6   ? Period Weeks   ? Status MET  ?  ? OT SHORT TERM GOAL #4  ? Title Patient will be educated and verbalize understanding of DME, adaptive equipment, and/or compensatory techniques that will allow her to participate in basic ADL tasks, such as bathing, grooming, and dressing with maximum assistance or less.   ? Time 6   ? Period Weeks   ? Status On-going   ? ?  ?  ? ?  ? ?ASSESSMENT: ?  ?CLINICAL IMPRESSION: A: Custom composite finger extension splint fabricated during session for right hand. Splint to be worn at night to allow decrease and/or slow down progression of increased  muscle tone present in all joints of hand (MCP, PIP, and DIP).  ? ?PLAN: ? ? ?OT FREQUENCY: 1x/week ? ?OT DURATION: 6 weeks ? ?PLANNED INTERVENTIONS: self care/ADL training, therapeutic exercise, therapeuti

## 2021-08-26 ENCOUNTER — Encounter (HOSPITAL_COMMUNITY): Payer: 59

## 2021-08-28 ENCOUNTER — Encounter (HOSPITAL_COMMUNITY): Payer: Self-pay | Admitting: Surgery

## 2021-08-28 NOTE — Progress Notes (Signed)
I faxed a Notice of Disability to [employer] ([place]) on 08/28/21.  I also completed the Statement of Disability for the [employer] on 08/28/21. I called the pt to let her know that the rest of the paperwork for the [employer] must be completed by her and her employer.  She verified understanding and is planning to come to our office tomorrow, 08/29/21, to pick up this paperwork. ?

## 2021-08-29 ENCOUNTER — Ambulatory Visit (HOSPITAL_COMMUNITY): Payer: 59

## 2021-08-29 ENCOUNTER — Encounter (HOSPITAL_COMMUNITY): Payer: Self-pay

## 2021-08-29 DIAGNOSIS — R208 Other disturbances of skin sensation: Secondary | ICD-10-CM | POA: Diagnosis not present

## 2021-08-29 DIAGNOSIS — R29898 Other symptoms and signs involving the musculoskeletal system: Secondary | ICD-10-CM

## 2021-08-29 DIAGNOSIS — R278 Other lack of coordination: Secondary | ICD-10-CM

## 2021-08-29 NOTE — Patient Instructions (Addendum)
Try to complete 2-3 times a day. No braces on while completing. Complete 5-10 repetitions each.  WRIST FLEXION EXTENSION - GRAVITY ELIMINATED - COMPLETE WITH BOTH HANDS  Place a small towel on a table and then place your wrist on the towel so that your palm points to the side as shown.   Next, bend your wrist back and forth as you slide the towel across the table.      WRIST RADIAL ULNAR DEVIATION - GRAVITY ELIMINATED LEFT HAND  Place a small towel on a table and then place the palm of your hand on the towel as shown.  Next, bend your wrist side-to-side as you slide the towel across the table.    Wrist Radial/Ulnar Deviation - RIGHT HAND  Begin by resting the wrist on its side and off of the edge of a table. Then, bend the wrist in the direction of the small finger. Then, bend the wrist in the direction of the thumb. Repeat.      *Continue working with yellow putty for hand strengthening.    1) SHOULDER: Flexion On Table   Place hands on towel placed on table, elbows straight. Lean forward with you upper body, pushing towel away from body.  Complete _10__ reps.

## 2021-08-29 NOTE — Therapy (Signed)
OUTPATIENT OCCUPATIONAL THERAPY TREATMENT NOTE Reassessment/Discharge   Patient Name: Michele Romero MRN: [id number] DOB:[birth year], 61 y.o., female Today's Date: 08/29/2021  PCP: [name], NP REFERRING PROVIDER: Derek Jack, MD   OT End of Session - 08/29/21 1336     Visit Number 9    Number of Visits 11    Date for OT Re-Evaluation 08/28/21    Authorization Type [employer]    Authorization Time Period no copay. Visit limit: 30 OT/PT/Chiro - 0 used    Authorization - Visit Number 9    Authorization - Number of Visits 30    OT Start Time 1300    OT Stop Time 8811    OT Time Calculation (min) 38 min    Activity Tolerance Patient tolerated treatment well    Behavior During Therapy WFL for tasks assessed/performed                 Past Medical History:  Diagnosis Date   Ambulates with cane 10/31/2020   Arthritis    RIGHT HIP   Asthma    Cigarette nicotine dependence without complication 06/26/9456   Complex tear of medial meniscus of right knee 01/20/2017   COPD (chronic obstructive pulmonary disease) ([place])    ENDOMETRIAL CANCER 10/31/2020   Endometrial cancer ([place]) 11/15/2020   GERD (gastroesophageal reflux disease)    History of radiation therapy    HDR brachytherapy [place] 05/08/2021-06/03/2021  Dr Gery Pray   Migraines    Port-A-Cath in place 01/01/2021   Past Surgical History:  Procedure Laterality Date   APPENDECTOMY     YRS AGO PER PT ON 10-31-2020   BIOPSY  11/29/2019   Procedure: BIOPSY;  Surgeon: Harvel Quale, MD;  [facility]: [facility];  Service: Gastroenterology;;  ileocecal vlve   CHOLECYSTECTOMY     YRS AGO PER PT ON 10-31-2020   COLONOSCOPY WITH PROPOFOL N/A 11/29/2019   Procedure: COLONOSCOPY WITH PROPOFOL;  Surgeon: Harvel Quale, MD;  [facility]: [facility];  Service: Gastroenterology;  Laterality: N/A;  930   HYSTEROSCOPY WITH D & C N/A 10/10/2020   Procedure: DILATATION AND CURETTAGE  /HYSTEROSCOPY;  Surgeon: Florian Buff, MD;  Location: [facility];  Service: Gynecology;  Laterality: N/A;   IR IMAGING GUIDED PORT INSERTION  01/08/2021   KNEE ARTHROPLASTY  2018   left knee   POLYPECTOMY  11/29/2019   Procedure: POLYPECTOMY;  Surgeon: Montez Morita, Quillian Quince, MD;  [facility]: [facility];  Service: Gastroenterology;;  sigmoid colon    ROBOTIC ASSISTED TOTAL HYSTERECTOMY WITH BILATERAL SALPINGO OOPHERECTOMY N/A 11/15/2020   Procedure: XI ROBOTIC ASSISTED TOTAL HYSTERECTOMY GREATER THAN TWO HUNDRED AND FIFTY GRAMS WITH BILATERAL SALPINGO OOPHORECTOMY, MINI LAPAROTOMY;  Surgeon: Everitt Amber, MD;  Location: [facility];  Service: Gynecology;  Laterality: N/A;   SENTINEL NODE BIOPSY N/A 11/15/2020   Procedure: SENTINEL NODE BIOPSY;  Surgeon: Everitt Amber, MD;  Location: [facility];  Service: Gynecology;  Laterality: N/A;   TUBAL LIGATION  04/14/1985   Patient Active Problem List   Diagnosis Date Noted   Dysuria 05/08/2021   Drug-induced neutropenia ([place]) 05/07/2021   Port-A-Cath in place 01/01/2021   Endometrial cancer ([place]) 11/15/2020   PMB (postmenopausal bleeding)    Thickened endometrium    Endometrial polyp    Left ear impacted cerumen 10/09/2020   Unilateral primary osteoarthritis, right hip 12/06/2019   Right hip pain 10/30/2019   Encounter for screening for malignant neoplasm of colon 10/30/2019  Encounter for general adult medical examination with abnormal findings 10/30/2019   Essential hypertension 10/30/2019   Chronic obstructive pulmonary disease ([place]) 10/28/2019   Prediabetes 01/20/2017   Osteoarthritis of right knee 01/20/2017   Morbid obesity ([place]) 06/25/2015    ONSET DATE: September 2022 - chemo was started  REFERRING DIAG: 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  THERAPY DIAG:  Other disturbances of skin sensation  Other lack of coordination  Other symptoms and signs involving the musculoskeletal  system   PERTINENT HISTORY:  Patient is a 61 y/o female S/P neuropathy affecting her BUE which started due to chemotherapy for Endometrial cancer. She started chemo September 2022 and has one more round to do. The neuropathy has effected primarily her hands causing increased tone in the right and decreased tone in her left. Full recovery of ROM and strength is not expected although patient would like to gain back some independence as she requires total assist to complete all ADL tasks. Dr. Delton Coombes has referred patient to occupational therapy for evaluation and treatment.    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 Active endometrial cancer  SUBJECTIVE: S: The splint for this hand makes my fingers sore, but it's ok. (Right hand splint)  PAIN:  Are you having pain? Yes: NPRS scale: 5/10 Pain location: right and left hands Pain description: constant, sore Aggravating factors: worse at night Relieving factors: Nothing     OBJECTIVE:    08/06/21: Assessed seated. Wrist originally assessed in gravity eliminated plane at initial evaluation. Today at reassessment, all measurements were taken against gravity unless otherwise marked.  Able to make a right hand fist 75% or greater. Unable to register grip strength on dynameter although able to squeeze therapist's hand. Right hand able to extend finger 50% (on eval: ~25%) A/ROM Right evaluation 07/17/21 08/29/21  Wrist flexion 42 64  64  Wrist extension 40 50 60 (gravity eliminated and against gravity)  Wrist radial deviation 18 20 28   Wrist ulnar deviation 0 18 22  A/ROM Left     Wrist flexion (gravity eliminated) 50 74 46   Wrist extension (gravity eliminated plane) 14 34 30  Wrist radial deviation (gravity eliminated) 18 22 30    Wrist ulnar deviation (gravity eliminated) 0 0 0  5/18: Able to form a  full fist with right hand. Able to complete ~24% range to form fist in left hand. Able to extend right hand 50% of range. Left  hand fingers extend almost fully.  Hand strength:  Grip strength right hand: 3#. Previous: 0#    TODAY'S TREATMENT: 08/29/21 - Table slides: flexion 10X - Splinting: provided additional sockettes and loop velcro for left hand splint.   08/22/21 - Splinting: Custom fabricated left hand composite finger extension splint made. Three 2 inch straps used to secure the forearm and PIP joints of 2nd-5th digits. One 1 inch strap placed to secure thumb.   Adjustments made to wrist brace fabricated at previous session.   08/13/21 - Splinting: Custom fabricated wrist brace made. Three 2 inch straps used to secure the forearm and MCP joints of 2nd-5th digits.      PATIENT EDUCATION: Education details: Reviewed HEP. Provided HEP for bilateral wrist ROM, table slide: flexion for left arm Person educated: patient Education method: verbalized, handout Education comprehension: verbalized understanding   HOME EXERCISE PROGRAM 4/26: finger extension stretch, coordination activity, yellow theraputty 5/18: shoulder flexion table slide, wrist A/ROM   OT Short Term Goals - 06/20/21 1405  OT SHORT TERM GOAL #1   Title Patient will be educated and independent with her HEP to work towards increasing the ROM, strength and  functional use of BUE when completing ADL tasks with assist.    Time 6    Period Weeks    Status MET   Target Date 06/28/21      OT SHORT TERM GOAL #2   Title Patient will increase her BUE A/ROM (wrist and forearm) by 10 degrees or more where lacking in order to increase her participation in dressing tasks with assistance from family.    Time 6    Period Weeks    Status MET (With exception of left wrist ulnar deviation and flexion)     OT SHORT TERM GOAL #3   Title Patient will complete self feeding with Supervision/set-up assist with the use of adaptive equipment.    Time 6    Period Weeks    Status MET     OT SHORT TERM GOAL #4   Title Patient will be educated and  verbalize understanding of DME, adaptive equipment, and/or compensatory techniques that will allow her to participate in basic ADL tasks, such as bathing, grooming, and dressing with maximum assistance or less.    Time 6    Period Weeks    Status MET           ASSESSMENT:   CLINICAL IMPRESSION: A: Reassessment completed this date. Patient has met all her therapy goals. Both right and left hands and wrist have shown improvement with the right showing the most improvement. Patient is able to use [employer] assistant to make phone calls, She is brushing her teeth with a piece of built up foam, she is able to feed herself for most food items using an universal cuff. A plate guard was also recommended. Two custom orthosis' have been made: a left wrist brace to provide correct support and stability to her wrist throughout the day and a right composite finger extension splint for night time use. A HEP has been established to continue working on ROM independently.   PLAN:   OT FREQUENCY: 1x/week  OT DURATION: 6 weeks  PLANNED INTERVENTIONS: self care/ADL training, therapeutic exercise, therapeutic activity, neuromuscular re-education, manual therapy, passive range of motion, functional mobility training, electrical stimulation, ultrasound, patient/family education, coping strategies training, and DME and/or AE instructions    CONSULTED AND AGREED WITH PLAN OF CARE: Patient  PLAN FOR NEXT SESSION: P:D/C from OT services with HEP.    OCCUPATIONAL THERAPY DISCHARGE SUMMARY  Visits from Start of Care: 9  Current functional level related to goals / functional outcomes: See above   Remaining deficits: See above   Education / Equipment: See above   Patient agrees to discharge. Patient goals were met. Patient is being discharged due to meeting the stated rehab goals.Ailene Ravel, OTR/L,CBIS  [phone]  08/29/2021, 4:28 PM

## 2021-09-03 ENCOUNTER — Encounter (HOSPITAL_COMMUNITY): Payer: 59

## 2021-09-04 ENCOUNTER — Encounter: Payer: Self-pay | Admitting: Gynecologic Oncology

## 2021-09-10 ENCOUNTER — Telehealth: Payer: Self-pay | Admitting: *Deleted

## 2021-09-10 ENCOUNTER — Encounter (HOSPITAL_COMMUNITY): Payer: 59

## 2021-09-12 ENCOUNTER — Inpatient Hospital Stay (HOSPITAL_BASED_OUTPATIENT_CLINIC_OR_DEPARTMENT_OTHER): Payer: 59 | Admitting: *Deleted

## 2021-09-12 ENCOUNTER — Encounter: Payer: Self-pay | Admitting: Gynecologic Oncology

## 2021-09-12 ENCOUNTER — Encounter: Payer: Self-pay | Admitting: *Deleted

## 2021-09-12 ENCOUNTER — Inpatient Hospital Stay: Payer: 59 | Attending: Gynecologic Oncology | Admitting: Gynecologic Oncology

## 2021-09-12 ENCOUNTER — Other Ambulatory Visit: Payer: Self-pay

## 2021-09-12 VITALS — BP 117/86 | HR 87 | Temp 98.2°F | Resp 16 | Ht 62.0 in | Wt 171.5 lb

## 2021-09-12 VITALS — BP 117/86 | HR 87 | Ht 62.0 in | Wt 171.5 lb

## 2021-09-12 DIAGNOSIS — R590 Localized enlarged lymph nodes: Secondary | ICD-10-CM | POA: Insufficient documentation

## 2021-09-12 DIAGNOSIS — N898 Other specified noninflammatory disorders of vagina: Secondary | ICD-10-CM | POA: Insufficient documentation

## 2021-09-12 DIAGNOSIS — Z923 Personal history of irradiation: Secondary | ICD-10-CM | POA: Diagnosis not present

## 2021-09-12 DIAGNOSIS — Z9221 Personal history of antineoplastic chemotherapy: Secondary | ICD-10-CM | POA: Diagnosis not present

## 2021-09-12 DIAGNOSIS — C541 Malignant neoplasm of endometrium: Secondary | ICD-10-CM

## 2021-09-12 DIAGNOSIS — Z8542 Personal history of malignant neoplasm of other parts of uterus: Secondary | ICD-10-CM | POA: Diagnosis not present

## 2021-09-12 DIAGNOSIS — Z90722 Acquired absence of ovaries, bilateral: Secondary | ICD-10-CM | POA: Diagnosis not present

## 2021-09-12 DIAGNOSIS — Z90710 Acquired absence of both cervix and uterus: Secondary | ICD-10-CM | POA: Insufficient documentation

## 2021-09-12 DIAGNOSIS — G620 Drug-induced polyneuropathy: Secondary | ICD-10-CM | POA: Diagnosis not present

## 2021-09-12 DIAGNOSIS — N3946 Mixed incontinence: Secondary | ICD-10-CM | POA: Insufficient documentation

## 2021-09-12 NOTE — Progress Notes (Signed)
   Pt was seen today for Survivorship appt. The end of treatment summary was reviewed with patient. Pt's vitals were WNL. Pt denies pain and has very little fatigue. 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 She takes Gabapentin to help with that. Pt is in a wheelchair today at visit. Pt does not exercise b/c of the numbness to hands and feet. Pt does not have a primary doctor. I have called [facility] and will let her know the FNP's are taking patients. Last colonoscopy was 8/21. Due this August 2023. I have called Dr. Cherlynn June office for follow-up. Pt did mention that she believes she may have a cataract to the right eye and will seek an eye doctor.Overall, pt said, "I feel like I'm doing okay."

## 2021-09-12 NOTE — Patient Instructions (Addendum)
It was good to see you today.    I will call you with the biopsy results once they are back, likely next week.  Again, I suspect what I was seeing was related to changes from radiation.  As long as the biopsy comes back negative for cancer, I will plan to see you back for follow-up in 6 months.  My schedule is not out past the early fall.  Please call back sometime after August or September to get a visit scheduled with me in December.  As always, if you develop any new and concerning symptoms between visits, such as vaginal bleeding, pelvic pain, or unintentional weight loss, please call to see me sooner.

## 2021-09-12 NOTE — Progress Notes (Signed)
Gynecologic Oncology Return Clinic Visit  09/12/21  Reason for Visit: surveillance visit in the setting of high risk uterine cancer  Treatment History: Oncology History  Endometrial cancer ([place])  11/15/2020 Initial Diagnosis   Endometrial cancer ([place])    01/23/2021 -  Chemotherapy   Patient is on Treatment Plan : UTERINE Carboplatin AUC 6 / Paclitaxel q21d       Her symptoms began in May, 2022 with postmenopausal bleeding. She had not been seen by her gynecologist, Dr. Glo Herring, for more than 10 years and therefore needed to reestablish care with his practice. She saw  Michele Romero  for a new patient visit fairly promptly on September 13, 2020.  Work-up of symptoms included a Pap smear, ultrasound, office biopsy and eventually D&C. Pap smear in 6-22 was benign and negative for high-risk HPV. Transvaginal US on 09/14/2020 showed an enlarged uterus measuring 11.3 x 8.4 x 7.5 cm with 2 fibroids one in the anterior right uterus measuring 6 cm and the other in the left uterus measuring 4 cm.  The endometrium was thickened to 19 mm.  The ovaries were not visualized. Endometrial sampling with an office [place] biopsy on 09/13/2020 showed predominantly blood with no endometrium identified. Therefore she was taken to the operating room for definitive diagnosis with a hysteroscopy D&C on 10/10/2020 which revealed carcinosarcoma   On 11/15/2020 she underwent robotic assisted total hysterectomy for uterus greater than 250 g, bilateral salpingo-oophorectomy, sentinel lymph node biopsy bilateral, mini laparotomy for specimen delivery. Intraoperative findings were significant for a 15 cm bulky uterus measuring 850 g, bilateral sentinel lymph node mapping, suspicious sentinel lymph node on both sides with the right external iliac lymph node evaluated microscopically on frozen section confirming micro metastatic tumor.  No gross residual disease after removal of the sentinel nodes and no gross residual disease in the nonsentinel  lymph nodes.  Normal-appearing omentum and upper abdomen. Surgery was uncomplicated though challenging due to the size of the uterus.  Final pathology revealed a carcinosarcoma measuring 8.5 cm arising in the endometrium.  It invaded 5 of 32 mm (less than 50%) of the myometrium with lymphovascular space invasion present.  The ovaries were negative for malignancy as with the cervix.  The right external iliac sentinel lymph node was positive for a micrometastasis measuring between 0.2 and 2 mm.  The left obturator sentinel lymph node was negative.  MMR testing revealed that MMR proteins were preserved and the tumor was MSI stable.  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  The patient completed adjuvant chemotherapy on 06/10/2021.  Patient received carboplatin and paclitaxel, although Taxol was held for cycles 5 and 6 secondary to neuropathy. Vaginal brachytherapy (HDR, 30 Gy, 5 fractions) was completed on 06/03/2021.  CT of the abdomen and pelvis on 07/02/2021 showed no evidence of recurrence or metastatic disease.  Unchanged prominent bilateral inguinal lymph nodes noted, nonspecific.  Interval History: Patient reports doing well.  She has been working with occupational therapy in the setting of her neuropathy.  She was just discharged from therapy.  Although she is not able to walk secondary to having lost sensation in her feet, she can now feed herself and wash the top part of her body.  She is able to transfer to the toilet and commode to be able to use the bathroom.  She endorses normal bowel function.  She has had worsening of her stress and urge urinary incontinence since prior to surgery.  She has some increased frequency at night.  She denies any vaginal bleeding or discharge.  Appetite has been improving.  She denies any  abdominal or pelvic pain.  Past Medical/Surgical History: Past Medical History:  Diagnosis Date   Ambulates with cane 10/31/2020   Arthritis    RIGHT HIP   Asthma    Cigarette nicotine dependence without complication 95/62/1308   Complex tear of medial meniscus of right knee 01/20/2017   COPD (chronic obstructive pulmonary disease) ([place])    ENDOMETRIAL CANCER 10/31/2020   Endometrial cancer ([place]) 11/15/2020   GERD (gastroesophageal reflux disease)    History of radiation therapy    HDR brachytherapy [place] 05/08/2021-06/03/2021  Dr Gery Pray   Migraines    Port-A-Cath in place 01/01/2021    Past Surgical History:  Procedure Laterality Date   APPENDECTOMY     YRS AGO PER PT ON 10-31-2020   BIOPSY  11/29/2019   Procedure: BIOPSY;  Surgeon: Harvel Quale, MD;  [facility];  Service: Gastroenterology;;  ileocecal vlve   CHOLECYSTECTOMY     YRS AGO PER PT ON 10-31-2020   COLONOSCOPY WITH PROPOFOL N/A 11/29/2019   Procedure: COLONOSCOPY WITH PROPOFOL;  Surgeon: Harvel Quale, MD;  [facility];  Service: Gastroenterology;  Laterality: N/A;  930   HYSTEROSCOPY WITH D & C N/A 10/10/2020   Procedure: DILATATION AND CURETTAGE /HYSTEROSCOPY;  Surgeon: Florian Buff, MD;  Location: [facility];  Service: Gynecology;  Laterality: N/A;   IR IMAGING GUIDED PORT INSERTION  01/08/2021   KNEE ARTHROPLASTY  2018   left knee   POLYPECTOMY  11/29/2019   Procedure: POLYPECTOMY;  Surgeon: Montez Morita, Quillian Quince, MD;  [facility]: [facility];  Service: Gastroenterology;;  sigmoid colon    ROBOTIC ASSISTED TOTAL HYSTERECTOMY WITH BILATERAL SALPINGO OOPHERECTOMY N/A 11/15/2020   Procedure: XI ROBOTIC ASSISTED TOTAL HYSTERECTOMY GREATER THAN TWO HUNDRED AND FIFTY GRAMS WITH BILATERAL SALPINGO OOPHORECTOMY, MINI LAPAROTOMY;  Surgeon: Everitt Amber, MD;  Location: [place];  Service: Gynecology;  Laterality: N/A;   SENTINEL NODE BIOPSY N/A 11/15/2020    Procedure: SENTINEL NODE BIOPSY;  Surgeon: Everitt Amber, MD;  [facility];  Service: Gynecology;  Laterality: N/A;   TUBAL LIGATION  04/14/1985    Family History  Problem Relation Age of Onset   Diabetes Mother    Hypertension Mother    Heart disease Mother        CHF   Stroke Mother    Heart disease Father    Colon cancer Brother    Breast cancer Neg Hx    Ovarian cancer Neg Hx    Endometrial cancer Neg Hx    Prostate cancer Neg Hx    Pancreatic cancer Neg Hx     Social History   Socioeconomic History   Marital status: Single    Spouse name: Not on file   Number of children: 2   Years of education: Not on file   Highest education level: High school graduate  Occupational History   Not on file  Tobacco Use   Smoking status: Former    Packs/day: 0.50    Years: 30.00    Pack years: 15.00    Types: Cigarettes    Quit date: 01/01/2020    Years since quitting: 1.6    Passive exposure: Never   Smokeless tobacco: Never  Vaping Use  Vaping Use: Never used  Substance and Sexual Activity   Alcohol use: No   Drug use: Yes    Frequency: 6.0 times per week    Types: Marijuana    Comment: every now and then   Sexual activity: Not Currently    Birth control/protection: Surgical    Comment: tubal  Other Topics Concern   Not on file  Social History Narrative   Lives with daughter and 1 grandbaby   Son is in [place]      Enjoys: sitting outside, music, tv      Diet: eats all food groups   Caffeine: soda and tea: 2-3 cups daily   Water: 2-3 16 oz bottles      Wears seat belt    Does not use phone while driving   Oceanographer at home    No weapons    Social Determinants of Health   Financial Resource Strain: High Risk   Difficulty of Paying Living Expenses: Hard  Food Insecurity: No Food Insecurity   Worried About Charity fundraiser in the Last Year: Never true   Arboriculturist in the Last Year: Never true  Transportation Needs: Unmet  Transportation Needs   Lack of Transportation (Medical): Yes   Lack of Transportation (Non-Medical): Yes  Physical Activity: Not on file  Stress: Stress Concern Present   Feeling of Stress : To some extent  Social Connections: Unknown   Frequency of Communication with Friends and Family: More than three times a week   Frequency of Social Gatherings with Friends and Family: More than three times a week   Attends Religious Services: More than 4 times per year   Active Member of [employer] or Organizations: Not on file   Attends Archivist Meetings: Not on file   Marital Status: Not on file    Current Medications:  Current Outpatient Medications:    albuterol (VENTOLIN HFA) 108 (90 Base) MCG/ACT inhaler, Inhale 2 puffs into the lungs every 6 (six) hours as needed for wheezing or shortness of breath., Disp: 8 g, Rfl: 2   budesonide-formoterol (SYMBICORT) 80-4.5 MCG/ACT inhaler, Inhale 2 puffs into the lungs 2 (two) times daily., Disp: 10.2 g, Rfl: 2   calcium carbonate (TUMS - DOSED IN MG ELEMENTAL CALCIUM) 500 MG chewable tablet, Chew 1 tablet by mouth as needed for indigestion or heartburn., Disp: , Rfl:    gabapentin (NEURONTIN) 300 MG capsule, Take 1 capsule (300 mg total) by mouth 3 (three) times daily. Take additional capsule at bed time for total dose 633m, Disp: 120 capsule, Rfl: 3   loratadine (CLARITIN) 10 MG tablet, Take 1 tablet (10 mg total) by mouth daily., Disp: 30 tablet, Rfl: 2   magnesium oxide (MAG-OX) 400 (240 Mg) MG tablet, Take 1 tablet (400 mg total) by mouth in the morning, at noon, and at bedtime., Disp: 90 tablet, Rfl: 3   Multiple Vitamin (MULTIVITAMIN WITH MINERALS) TABS tablet, Take 1 tablet by mouth daily., Disp: , Rfl:    ondansetron (ZOFRAN ODT) 8 MG disintegrating tablet, Take 1 tablet (8 mg total) by mouth every 8 (eight) hours as needed for nausea or vomiting., Disp: 8 tablet, Rfl: 0   pantoprazole (PROTONIX) 40 MG tablet, Take 1 tablet (40 mg total) by  mouth daily., Disp: 30 tablet, Rfl: 1   potassium chloride SA (KLOR-CON M) 20 MEQ tablet, Take 1 tablet (20 mEq total) by mouth daily., Disp: 30 tablet, Rfl: 3   vitamin B-12 (CYANOCOBALAMIN)  500 MCG tablet, Take 500 mcg by mouth daily., Disp: , Rfl:    CARBOPLATIN IV, Inject into the vein every 21 ( twenty-one) days. (Patient not taking: Reported on 07/12/2021), Disp: , Rfl:    PACLITAXEL IV, Inject into the vein every 21 ( twenty-one) days. (Patient not taking: Reported on 07/12/2021), Disp: , Rfl:    Pegfilgrastim-cbqv (UDENYCA [place]), Inject into the skin every 21 ( twenty-one) days. Administer on Day 3 of each chemotherapy cycle (Patient not taking: Reported on 07/12/2021), Disp: , Rfl:    traMADol (ULTRAM) 50 MG tablet, Take 1-2 tablets at bed time as needed (Patient not taking: Reported on 07/12/2021), Disp: 60 tablet, Rfl: 0  Review of Systems: Denies appetite changes, fevers, chills, fatigue, unexplained weight changes. Denies hearing loss, neck lumps or masses, mouth sores, ringing in ears or voice changes. Denies cough or wheezing.  Denies shortness of breath. Denies chest pain or palpitations. Denies leg swelling. Denies abdominal distention, pain, blood in stools, constipation, diarrhea, nausea, vomiting, or early satiety. Denies pain with intercourse, dysuria, frequency, hematuria or incontinence. Denies hot flashes, pelvic pain, vaginal bleeding or vaginal discharge.   Denies joint pain, back pain or muscle pain/cramps. Denies itching, rash, or wounds. Denies dizziness, headaches or seizures. Denies swollen lymph nodes or glands, denies easy bruising or bleeding. Denies anxiety, depression, confusion, or decreased concentration.  Physical Exam: BP 117/86 (BP Location: Right Arm, Patient Position: Sitting)   Pulse 87   Temp 98.2 F (36.8 C) (Oral)   Resp 16   Ht 5' 2"  (1.575 m)   Wt 171 lb 8 oz (77.8 kg)   LMP 04/14/2010   SpO2 98%   BMI 31.37 kg/m  General: Alert, oriented,  no acute distress. HEENT: Normocephalic, atraumatic, sclera anicteric. Chest: Unlabored breathing on room air. Cardiovascular: Regular rate and rhythm, no murmurs. Abdomen: soft, nontender.  Normoactive bowel sounds.  No masses or hepatosplenomegaly appreciated.  Well-healed incisions. Extremities: Grossly normal range of motion although somewhat limited movement of her right lower extremity secondary to hip pain.  Warm, well perfused.  No edema bilaterally. Skin: No rashes or lesions noted. Lymphatics: No cervical, supraclavicular, or inguinal adenopathy. GU: Normal appearing external genitalia without erythema, excoriation, or lesions.  Speculum exam reveals mildly atrophic vaginal mucosa with radiation changes evident at the apex.  Along the posterior vagina approximately 2 cm from the apex, there is a 2 x 2 centimeter area of more glandular appearing tissue with some mild atypical vascularity.  Bimanual exam reveals cuff is smooth, no nodularity or masses appreciated along the cuff or vaginal walls.  Rectovaginal exam  confirms these findings.  Vaginal biopsy procedure Preoperative diagnosis: Vaginal lesion Postoperative diagnosis: Same as above Procedure: Vaginal biopsy, posterior Specimen: Posterior vagina Estimated blood loss: Minimal Procedure: After the procedure was discussed with the patient, she gave verbal consent.  Speculum was already in the vagina and the area to be biopsy was well visualized.  This was cleansed with Betadine x3.  Tischler forceps were then used to biopsy the vagina, which was placed in formalin.  Minimal bleeding was noted.  Silver nitrate was used to achieve hemostasis.  All instruments removed from the vagina.  Overall the patient tolerated the procedure well.  Laboratory & Radiologic Studies: None new  Assessment & Plan: Michele Romero is a 61 y.o. woman with stage IIIC1 carcinosarcoma of the endometrium, MMR normal/MSI stable. S/p  hysterectomy/bso/staging on 11/15/20.  Now status post adjuvant chemotherapy and vaginal brachytherapy.  The patient  is overall doing well.  Vaginal lesion, which I suspect is related to prior treatment, biopsied though given its appearance as well as her high risk histology.  I will call her with these results.  As long as biopsy is negative for recurrent cancer, I recommend continued follow-up every 3 months.  Given nonspecific persistent inguinal adenopathy in the setting of her high risk disease, I would recommend follow-up imaging 6 months after her CT scan, in September of this year.  Per NCCN surveillance recommendations, we discussed visits every 3 months with a pelvic exam, ideally alternating between radiation oncology and my clinic.  She will also continue to see her medical oncologist at [facility].  The patient is scheduled to see Dr. Delton Coombes in early July and Dr. Sondra Come in late August.  28 minutes of total time was spent for this patient encounter, including preparation, face-to-face counseling with the patient and coordination of care, and documentation of the encounter.  Jeral Pinch, MD  Division of Gynecologic Oncology  Department of Obstetrics and Gynecology  [facility]

## 2021-09-13 ENCOUNTER — Telehealth: Payer: Self-pay | Admitting: Gynecologic Oncology

## 2021-09-13 LAB — SURGICAL PATHOLOGY

## 2021-09-13 NOTE — Telephone Encounter (Signed)
I called patient with biopsy results from clinic earlier this week.  Biopsy shows granulation tissue, no malignancy.  Patient happy with this news.  Jeral Pinch MD Gynecologic Oncology

## 2021-09-17 ENCOUNTER — Other Ambulatory Visit: Payer: Self-pay

## 2021-09-17 ENCOUNTER — Encounter (HOSPITAL_COMMUNITY): Payer: Self-pay | Admitting: Hematology

## 2021-09-17 ENCOUNTER — Emergency Department (HOSPITAL_COMMUNITY): Payer: 59

## 2021-09-17 ENCOUNTER — Encounter (HOSPITAL_COMMUNITY): Payer: Self-pay | Admitting: Emergency Medicine

## 2021-09-17 ENCOUNTER — Emergency Department (HOSPITAL_COMMUNITY)
Admission: EM | Admit: 2021-09-17 | Discharge: 2021-09-17 | Disposition: A | Discharge status: Home / Self Care | Payer: 59 | Attending: Emergency Medicine | Admitting: Emergency Medicine

## 2021-09-17 DIAGNOSIS — Z8542 Personal history of malignant neoplasm of other parts of uterus: Secondary | ICD-10-CM | POA: Diagnosis not present

## 2021-09-17 DIAGNOSIS — R55 Syncope and collapse: Secondary | ICD-10-CM | POA: Diagnosis not present

## 2021-09-17 DIAGNOSIS — R739 Hyperglycemia, unspecified: Secondary | ICD-10-CM | POA: Insufficient documentation

## 2021-09-17 DIAGNOSIS — R000 Tachycardia, unspecified: Secondary | ICD-10-CM | POA: Insufficient documentation

## 2021-09-17 DIAGNOSIS — R42 Dizziness and giddiness: Secondary | ICD-10-CM | POA: Diagnosis not present

## 2021-09-17 LAB — CBC
HCT: 31.2 % — ABNORMAL LOW (ref 36.0–46.0)
Hemoglobin: 10.3 g/dL — ABNORMAL LOW (ref 12.0–15.0)
MCH: 32.3 pg (ref 26.0–34.0)
MCHC: 33 g/dL (ref 30.0–36.0)
MCV: 97.8 fL (ref 80.0–100.0)
Platelets: 219 10*3/uL (ref 150–400)
RBC: 3.19 MIL/uL — ABNORMAL LOW (ref 3.87–5.11)
RDW: 13.3 % (ref 11.5–15.5)
WBC: 10.7 10*3/uL — ABNORMAL HIGH (ref 4.0–10.5)
nRBC: 0 % (ref 0.0–0.2)

## 2021-09-17 LAB — URINALYSIS, ROUTINE W REFLEX MICROSCOPIC
Bilirubin Urine: NEGATIVE
Glucose, UA: NEGATIVE mg/dL
Hgb urine dipstick: NEGATIVE
Ketones, ur: 20 mg/dL — AB
Nitrite: NEGATIVE
Protein, ur: 30 mg/dL — AB
Specific Gravity, Urine: 1.02 (ref 1.005–1.030)
WBC, UA: >50 WBC/hpf — ABNORMAL HIGH (ref 0–5)
pH: 6 (ref 5.0–8.0)

## 2021-09-17 LAB — BASIC METABOLIC PANEL
Anion gap: 7 (ref 5–15)
BUN: 10 mg/dL (ref 8–23)
CO2: 23 mmol/L (ref 22–32)
Calcium: 8.5 mg/dL — ABNORMAL LOW (ref 8.9–10.3)
Chloride: 105 mmol/L (ref 98–111)
Creatinine, Ser: 0.46 mg/dL (ref 0.44–1.00)
GFR, Estimated: >60 mL/min (ref >60)
Glucose, Bld: 131 mg/dL — ABNORMAL HIGH (ref 70–99)
Potassium: 3.2 mmol/L — ABNORMAL LOW (ref 3.5–5.1)
Sodium: 135 mmol/L (ref 135–145)

## 2021-09-17 LAB — CBG MONITORING, ED: Glucose-Capillary: 127 mg/dL — ABNORMAL HIGH (ref 70–99)

## 2021-09-17 LAB — D-DIMER, QUANTITATIVE: D-Dimer, Quant: 4.03 ug/mL-FEU — ABNORMAL HIGH (ref 0.00–0.50)

## 2021-09-17 MED ORDER — HEPARIN SOD (PORK) LOCK FLUSH 100 UNIT/ML IV SOLN
500.0000 [IU] | Freq: Once | INTRAVENOUS | Status: AC
  Administered 2021-09-17: 500 [IU]
  Filled 2021-09-17: qty 5

## 2021-09-17 MED ORDER — IOHEXOL 350 MG/ML SOLN
100.0000 mL | Freq: Once | INTRAVENOUS | Status: AC | PRN
  Administered 2021-09-17: 80 mL via INTRAVENOUS

## 2021-09-17 MED ORDER — SODIUM CHLORIDE 0.9 % IV BOLUS
1000.0000 mL | Freq: Once | INTRAVENOUS | Status: AC
  Administered 2021-09-17: 1000 mL via INTRAVENOUS

## 2021-09-17 NOTE — Discharge Instructions (Addendum)
As discussed, your evaluation today has been largely reassuring.  But, it is important that you monitor your condition carefully, and do not hesitate to return to the ED if you develop new, or concerning changes in your condition. ? ?Otherwise, please follow-up with your physician for appropriate ongoing care. ? ?

## 2021-09-17 NOTE — ED Notes (Signed)
Pt provided discharge instructions and prescription information. Pt was given the opportunity to ask questions and questions were answered.   

## 2021-09-17 NOTE — ED Triage Notes (Signed)
Per pt she had two syncopal episodes on yesterday, currently having dizziness, weakness, and lower abdominal pain, currently being treated for Endometrial cancer.

## 2021-09-17 NOTE — ED Provider Notes (Signed)
[facility] EMERGENCY DEPARTMENT Provider Note   CSN: [id number] Arrival date & time: 09/17/21  1219     History  Chief Complaint  Patient presents with   Dizziness    Michele Romero is a 61 y.o. female.  HPI Patient presents with concern of an episode of syncope and another of near syncope that occurred yesterday.  Currently no pain, complaints.  She has a history notable for prior cancer, now in remission since about 4 months ago.  She was well prior to yesterday's episodes, and recalls both events yesterday with some clarity, does not seem as though she lost consciousness for any sustained amount of time.  She denies pain either before or after either event.    Home Medications Prior to Admission medications   Medication Sig Start Date End Date Taking? Authorizing Provider  albuterol (VENTOLIN HFA) 108 (90 Base) MCG/ACT inhaler Inhale 2 puffs into the lungs every 6 (six) hours as needed for wheezing or shortness of breath. Patient not taking: Reported on 09/12/2021 10/09/20   [name], NP  budesonide-formoterol [facility]) 80-4.5 MCG/ACT inhaler Inhale 2 puffs into the lungs 2 (two) times daily. 10/09/20   [name], NP  calcium carbonate (TUMS - DOSED IN MG ELEMENTAL CALCIUM) 500 MG chewable tablet Chew 1 tablet by mouth as needed for indigestion or heartburn.    [provider]  gabapentin (NEURONTIN) 300 MG capsule Take 1 capsule (300 mg total) by mouth 3 (three) times daily. Take additional capsule at bed time for total dose '600mg'$  05/29/21   Derek Jack, MD  loratadine (CLARITIN) 10 MG tablet Take 1 tablet (10 mg total) by mouth daily. 01/23/21   Derek Jack, MD  magnesium oxide (MAG-OX) 400 (240 Mg) MG tablet Take 1 tablet (400 mg total) by mouth in the morning, at noon, and at bedtime. 07/09/21   Derek Jack, MD  Multiple Vitamin (MULTIVITAMIN WITH MINERALS) TABS tablet Take 1 tablet by mouth daily.    [provider]   ondansetron (ZOFRAN ODT) 8 MG disintegrating tablet Take 1 tablet (8 mg total) by mouth every 8 (eight) hours as needed for nausea or vomiting. 10/10/20   Florian Buff, MD  pantoprazole (PROTONIX) 40 MG tablet Take 1 tablet (40 mg total) by mouth daily. 01/25/21   Daleen Bo, MD  potassium chloride SA (KLOR-CON M) 20 MEQ tablet Take 1 tablet (20 mEq total) by mouth daily. 04/03/21   Derek Jack, MD  vitamin B-12 (CYANOCOBALAMIN) 500 MCG tablet Take 500 mcg by mouth daily.    [provider]      Allergies    Codeine    Review of Systems   Review of Systems  All other systems reviewed and are negative.  Physical Exam Updated Vital Signs BP 110/73   Pulse 98   Resp 18   Ht '5\' 2"'$  (1.575 m)   Wt 81.2 kg   LMP 04/14/2010   SpO2 99%   BMI 32.74 kg/m  Physical Exam Vitals and nursing note reviewed.  Constitutional:      General: She is not in acute distress.    Appearance: She is well-developed.  HENT:     Head: Normocephalic and atraumatic.  Eyes:     Conjunctiva/sclera: Conjunctivae normal.  Cardiovascular:     Rate and Rhythm: Regular rhythm. Tachycardia present.  Pulmonary:     Effort: Pulmonary effort is normal. No respiratory distress.     Breath sounds: Normal breath sounds. No stridor.  Chest:  Abdominal:     General: There is no distension.  Skin:    General: Skin is warm and dry.  Neurological:     Mental Status: She is alert and oriented to person, place, and time.     Cranial Nerves: No cranial nerve deficit.  Psychiatric:        Mood and Affect: Mood normal.    ED Results / Procedures / Treatments   Labs (all labs ordered are listed, but only abnormal results are displayed) Labs Reviewed  BASIC METABOLIC PANEL - Abnormal; Notable for the following components:      Result Value   Potassium 3.2 (*)    Glucose, Bld 131 (*)    Calcium 8.5 (*)    All other components within normal limits  CBC - Abnormal; Notable for the  following components:   WBC 10.7 (*)    RBC 3.19 (*)    Hemoglobin 10.3 (*)    HCT 31.2 (*)    All other components within normal limits  URINALYSIS, ROUTINE W REFLEX MICROSCOPIC - Abnormal; Notable for the following components:   APPearance HAZY (*)    Ketones, ur 20 (*)    Protein, ur 30 (*)    Leukocytes,Ua LARGE (*)    WBC, UA >50 (*)    Bacteria, UA FEW (*)    All other components within normal limits  D-DIMER, QUANTITATIVE - Abnormal; Notable for the following components:   D-Dimer, Quant 4.03 (*)    All other components within normal limits  CBG MONITORING, ED - Abnormal; Notable for the following components:   Glucose-Capillary 127 (*)    All other components within normal limits    EKG EKG Interpretation  Date/Time:  Tuesday September 17 2021 12:50:23 EDT Ventricular Rate:  117 PR Interval:  156 QRS Duration: 75 QT Interval:  323 QTC Calculation: 451 R Axis:   57 Text Interpretation: Sinus tachycardia Atrial premature complexes Abnormal R-wave progression, early transition Borderline T abnormalities, diffuse leads Abnormal ECG Confirmed by Carmin Muskrat [phone]) on 09/17/2021 1:03:04 PM  Radiology CT Angio Chest PE W and/or Wo Contrast  Result Date: 09/17/2021 CLINICAL DATA:  Pulmonary embolism (PE) suspected, positive D-dimer EXAM: CT ANGIOGRAPHY CHEST WITH CONTRAST TECHNIQUE: Multidetector CT imaging of the chest was performed using the standard protocol during bolus administration of intravenous contrast. Multiplanar CT image reconstructions and MIPs were obtained to evaluate the vascular anatomy. RADIATION DOSE REDUCTION: This exam was performed according to the departmental dose-optimization program which includes automated exposure control, adjustment of the mA and/or kV according to patient size and/or use of iterative reconstruction technique. CONTRAST:  86m OMNIPAQUE IOHEXOL 350 MG/ML SOLN COMPARISON:  None Available. FINDINGS: Cardiovascular: Satisfactory  opacification of the pulmonary arteries to the segmental level. No evidence of pulmonary embolism. Normal heart size. No pericardial effusion. Thoracic aorta is normal in caliber with mild calcified plaque. Right chest wall port catheter tip is at the cavoatrial junction. Mediastinum/Nodes: No enlarged nodes. Included thyroid is unremarkable. Esophagus is unremarkable. Lungs/Pleura: No consolidation or mass. No pleural effusion or pneumothorax. Upper Abdomen: No acute abnormality. Musculoskeletal: Degenerative changes of the thoracic spine. Review of the MIP images confirms the above findings. IMPRESSION: No acute pulmonary embolism or other acute abnormality. Electronically Signed   By: PMacy MisM.D.   On: 09/17/2021 15:18    Procedures Procedures    Medications Ordered in ED Medications  sodium chloride 0.9 % bolus 1,000 mL (1,000 mLs Intravenous New Bag/Given 09/17/21 1400)  iohexol (OMNIPAQUE)  350 MG/ML injection 100 mL (80 mLs Intravenous Contrast Given 09/17/21 1504)    This patient with a Hx of cancer recently completed therapy presents to the ED for concern of syncope, near syncope x2, this involves an extensive number of treatment options, and is a complaint that carries with it a high risk of complications and morbidity.    The differential diagnosis includes rhythm genic, vasogenic, neurogenic syncope or dehydration   Social Determinants of Health:  Age  Additional history obtained:  Additional history and/or information obtained from female companion at bedside, chart review, notable for history of present illness as above, and history of recent cancer completion therapy   After the initial evaluation, orders, including: Labs were initiated.   Patient placed on Cardiac and Pulse-Oximetry Monitors. The patient was maintained on a cardiac monitor.  The cardiac monitored showed an rhythm of 115 sinus tach abnormal The patient was also maintained on pulse oximetry. The  readings were typically 99% room air normal   On repeat evaluation of the patient stayed the same  Lab Tests:  I personally interpreted labs.  The pertinent results include: Positive D-dimer, mild hyperglycemia  Imaging Studies ordered:  I independently visualized and interpreted imaging which showed no pulmonary embolism, no pneumonia I agree with the radiologist interpretation   Dispostion / Final MDM: 4:10 PM Patient awake, alert, vital signs of normalized, with heart rate less than 100, normal blood pressure, no hypoxia.  Response to fluids is reassuring.  Patient found to have no evidence for pulmonary exam, pneumonia, no evidence for atypical ACS, I discussed all results with patient and her female companion, they are comfortable with discharge with outpatient follow-up given the reassuring findings, though hospitalization was a consideration given the patient's syncope, cancer history Final Clinical Impression(s) / ED Diagnoses Final diagnoses:  Syncope and collapse    Rx / DC Orders ED Discharge Orders     None         Carmin Muskrat, MD 09/17/21 1610

## 2021-09-23 ENCOUNTER — Encounter (HOSPITAL_COMMUNITY): Payer: Self-pay | Admitting: Hematology

## 2021-10-09 ENCOUNTER — Other Ambulatory Visit: Payer: Self-pay

## 2021-10-09 ENCOUNTER — Emergency Department (HOSPITAL_COMMUNITY): Payer: 59

## 2021-10-09 ENCOUNTER — Encounter (HOSPITAL_COMMUNITY): Payer: Self-pay

## 2021-10-09 ENCOUNTER — Encounter (HOSPITAL_COMMUNITY): Payer: Self-pay | Admitting: *Deleted

## 2021-10-09 ENCOUNTER — Emergency Department (HOSPITAL_COMMUNITY)
Admission: EM | Admit: 2021-10-09 | Discharge: 2021-10-10 | Disposition: A | Discharge status: Home / Self Care | Payer: 59 | Source: Home / Self Care | Attending: Emergency Medicine | Admitting: Emergency Medicine

## 2021-10-09 ENCOUNTER — Inpatient Hospital Stay (HOSPITAL_COMMUNITY): Payer: 59 | Attending: Hematology

## 2021-10-09 VITALS — BP 93/69 | HR 100 | Temp 99.2°F | Resp 18

## 2021-10-09 DIAGNOSIS — Z7951 Long term (current) use of inhaled steroids: Secondary | ICD-10-CM | POA: Insufficient documentation

## 2021-10-09 DIAGNOSIS — Z20822 Contact with and (suspected) exposure to covid-19: Secondary | ICD-10-CM | POA: Insufficient documentation

## 2021-10-09 DIAGNOSIS — J449 Chronic obstructive pulmonary disease, unspecified: Secondary | ICD-10-CM | POA: Insufficient documentation

## 2021-10-09 DIAGNOSIS — R0602 Shortness of breath: Secondary | ICD-10-CM | POA: Insufficient documentation

## 2021-10-09 DIAGNOSIS — R072 Precordial pain: Secondary | ICD-10-CM | POA: Insufficient documentation

## 2021-10-09 DIAGNOSIS — J45909 Unspecified asthma, uncomplicated: Secondary | ICD-10-CM | POA: Insufficient documentation

## 2021-10-09 DIAGNOSIS — Z8542 Personal history of malignant neoplasm of other parts of uterus: Secondary | ICD-10-CM | POA: Insufficient documentation

## 2021-10-09 DIAGNOSIS — R000 Tachycardia, unspecified: Secondary | ICD-10-CM | POA: Insufficient documentation

## 2021-10-09 DIAGNOSIS — E876 Hypokalemia: Secondary | ICD-10-CM | POA: Insufficient documentation

## 2021-10-09 DIAGNOSIS — Z7982 Long term (current) use of aspirin: Secondary | ICD-10-CM | POA: Insufficient documentation

## 2021-10-09 DIAGNOSIS — R197 Diarrhea, unspecified: Secondary | ICD-10-CM | POA: Insufficient documentation

## 2021-10-09 DIAGNOSIS — R6883 Chills (without fever): Secondary | ICD-10-CM | POA: Insufficient documentation

## 2021-10-09 DIAGNOSIS — Z95828 Presence of other vascular implants and grafts: Secondary | ICD-10-CM

## 2021-10-09 DIAGNOSIS — Z452 Encounter for adjustment and management of vascular access device: Secondary | ICD-10-CM | POA: Insufficient documentation

## 2021-10-09 DIAGNOSIS — R0789 Other chest pain: Secondary | ICD-10-CM

## 2021-10-09 DIAGNOSIS — F419 Anxiety disorder, unspecified: Secondary | ICD-10-CM | POA: Insufficient documentation

## 2021-10-09 DIAGNOSIS — R509 Fever, unspecified: Secondary | ICD-10-CM

## 2021-10-09 DIAGNOSIS — R110 Nausea: Secondary | ICD-10-CM | POA: Insufficient documentation

## 2021-10-09 DIAGNOSIS — C541 Malignant neoplasm of endometrium: Secondary | ICD-10-CM

## 2021-10-09 LAB — COMPREHENSIVE METABOLIC PANEL
ALT: 7 U/L (ref 0–44)
AST: 10 U/L — ABNORMAL LOW (ref 15–41)
Albumin: 2.9 g/dL — ABNORMAL LOW (ref 3.5–5.0)
Alkaline Phosphatase: 55 U/L (ref 38–126)
Anion gap: 9 (ref 5–15)
BUN: 11 mg/dL (ref 8–23)
CO2: 23 mmol/L (ref 22–32)
Calcium: 8.5 mg/dL — ABNORMAL LOW (ref 8.9–10.3)
Chloride: 103 mmol/L (ref 98–111)
Creatinine, Ser: 0.53 mg/dL (ref 0.44–1.00)
GFR, Estimated: >60 mL/min (ref >60)
Glucose, Bld: 113 mg/dL — ABNORMAL HIGH (ref 70–99)
Potassium: 3.6 mmol/L (ref 3.5–5.1)
Sodium: 135 mmol/L (ref 135–145)
Total Bilirubin: 0.6 mg/dL (ref 0.3–1.2)
Total Protein: 7.3 g/dL (ref 6.5–8.1)

## 2021-10-09 LAB — BASIC METABOLIC PANEL
Anion gap: 11 (ref 5–15)
BUN: 11 mg/dL (ref 8–23)
CO2: 21 mmol/L — ABNORMAL LOW (ref 22–32)
Calcium: 8.4 mg/dL — ABNORMAL LOW (ref 8.9–10.3)
Chloride: 102 mmol/L (ref 98–111)
Creatinine, Ser: 0.65 mg/dL (ref 0.44–1.00)
GFR, Estimated: >60 mL/min (ref >60)
Glucose, Bld: 116 mg/dL — ABNORMAL HIGH (ref 70–99)
Potassium: 3.2 mmol/L — ABNORMAL LOW (ref 3.5–5.1)
Sodium: 134 mmol/L — ABNORMAL LOW (ref 135–145)

## 2021-10-09 LAB — CBC WITH DIFFERENTIAL/PLATELET
Abs Immature Granulocytes: 0.04 10*3/uL (ref 0.00–0.07)
Basophils Absolute: 0 10*3/uL (ref 0.0–0.1)
Basophils Relative: 0 %
Eosinophils Absolute: 0 10*3/uL (ref 0.0–0.5)
Eosinophils Relative: 0 %
HCT: 23.8 % — ABNORMAL LOW (ref 36.0–46.0)
Hemoglobin: 7.7 g/dL — ABNORMAL LOW (ref 12.0–15.0)
Immature Granulocytes: 1 %
Lymphocytes Relative: 14 %
Lymphs Abs: 1.2 10*3/uL (ref 0.7–4.0)
MCH: 30 pg (ref 26.0–34.0)
MCHC: 32.4 g/dL (ref 30.0–36.0)
MCV: 92.6 fL (ref 80.0–100.0)
Monocytes Absolute: 0.7 10*3/uL (ref 0.1–1.0)
Monocytes Relative: 8 %
Neutro Abs: 6.8 10*3/uL (ref 1.7–7.7)
Neutrophils Relative %: 77 %
Platelets: 332 10*3/uL (ref 150–400)
RBC: 2.57 MIL/uL — ABNORMAL LOW (ref 3.87–5.11)
RDW: 14.8 % (ref 11.5–15.5)
WBC: 8.8 10*3/uL (ref 4.0–10.5)
nRBC: 0 % (ref 0.0–0.2)

## 2021-10-09 LAB — CBC
HCT: 23.1 % — ABNORMAL LOW (ref 36.0–46.0)
Hemoglobin: 7.7 g/dL — ABNORMAL LOW (ref 12.0–15.0)
MCH: 30.4 pg (ref 26.0–34.0)
MCHC: 33.3 g/dL (ref 30.0–36.0)
MCV: 91.3 fL (ref 80.0–100.0)
Platelets: 324 10*3/uL (ref 150–400)
RBC: 2.53 MIL/uL — ABNORMAL LOW (ref 3.87–5.11)
RDW: 14.9 % (ref 11.5–15.5)
WBC: 9.7 10*3/uL (ref 4.0–10.5)
nRBC: 0 % (ref 0.0–0.2)

## 2021-10-09 LAB — PROTIME-INR
INR: 1.4 — ABNORMAL HIGH (ref 0.8–1.2)
Prothrombin Time: 16.7 seconds — ABNORMAL HIGH (ref 11.4–15.2)

## 2021-10-09 LAB — TROPONIN I (HIGH SENSITIVITY)
Troponin I (High Sensitivity): 3 ng/L (ref <18)
Troponin I (High Sensitivity): 4 ng/L (ref <18)

## 2021-10-09 LAB — MAGNESIUM: Magnesium: 1.6 mg/dL — ABNORMAL LOW (ref 1.7–2.4)

## 2021-10-09 LAB — LACTIC ACID, PLASMA: Lactic Acid, Venous: 1.5 mmol/L (ref 0.5–1.9)

## 2021-10-09 LAB — APTT: aPTT: 49 seconds — ABNORMAL HIGH (ref 24–36)

## 2021-10-09 LAB — RESP PANEL BY RT-PCR (FLU A&B, COVID) ARPGX2
Influenza A by PCR: NEGATIVE
Influenza B by PCR: NEGATIVE
SARS Coronavirus 2 by RT PCR: NEGATIVE

## 2021-10-09 MED ORDER — SODIUM CHLORIDE 0.9% FLUSH
10.0000 mL | Freq: Once | INTRAVENOUS | Status: AC
  Administered 2021-10-09: 10 mL via INTRAVENOUS

## 2021-10-09 MED ORDER — LACTATED RINGERS IV BOLUS (SEPSIS)
500.0000 mL | Freq: Once | INTRAVENOUS | Status: AC
  Administered 2021-10-10: 500 mL via INTRAVENOUS

## 2021-10-09 MED ORDER — IOHEXOL 350 MG/ML SOLN
100.0000 mL | Freq: Once | INTRAVENOUS | Status: AC | PRN
  Administered 2021-10-09: 100 mL via INTRAVENOUS

## 2021-10-09 MED ORDER — LACTATED RINGERS IV BOLUS (SEPSIS)
1000.0000 mL | Freq: Once | INTRAVENOUS | Status: AC
  Administered 2021-10-09: 1000 mL via INTRAVENOUS

## 2021-10-09 MED ORDER — ACETAMINOPHEN 325 MG PO TABS
650.0000 mg | ORAL_TABLET | Freq: Once | ORAL | Status: AC
  Administered 2021-10-10: 650 mg via ORAL
  Filled 2021-10-09: qty 2

## 2021-10-09 MED ORDER — HEPARIN SOD (PORK) LOCK FLUSH 100 UNIT/ML IV SOLN
500.0000 [IU] | Freq: Once | INTRAVENOUS | Status: AC
  Administered 2021-10-09: 500 [IU] via INTRAVENOUS

## 2021-10-09 MED ORDER — VANCOMYCIN HCL IN DEXTROSE 1-5 GM/200ML-% IV SOLN
1000.0000 mg | Freq: Once | INTRAVENOUS | Status: AC
  Administered 2021-10-09: 1000 mg via INTRAVENOUS
  Filled 2021-10-09: qty 200

## 2021-10-09 MED ORDER — LACTATED RINGERS IV SOLN: INTRAVENOUS | Status: DC

## 2021-10-09 MED ORDER — METRONIDAZOLE 500 MG/100ML IV SOLN
500.0000 mg | Freq: Once | INTRAVENOUS | Status: AC
  Administered 2021-10-09: 500 mg via INTRAVENOUS
  Filled 2021-10-09: qty 100

## 2021-10-09 MED ORDER — SODIUM CHLORIDE 0.9 % IV SOLN
2.0000 g | Freq: Once | INTRAVENOUS | Status: AC
  Administered 2021-10-09: 2 g via INTRAVENOUS
  Filled 2021-10-09: qty 12.5

## 2021-10-09 MED ORDER — LOPERAMIDE HCL 2 MG PO CAPS: 2.0000 mg | ORAL_CAPSULE | ORAL | 0 refills | Status: DC | PRN

## 2021-10-09 NOTE — Progress Notes (Signed)
Patient presents today for port flush/lab. Patient reports having diarrhea, per standing orders, Imodium ordered.   Port flushed with good blood return noted. No bruising or swelling at site. Bandaid applied and patient discharged in satisfactory condition. VVS stable with no signs or symptoms of distressed noted.

## 2021-10-09 NOTE — Patient Instructions (Signed)
[facility]  Discharge Instructions: Thank you for choosing [facility] to provide your oncology and hematology care.  If you have a lab appointment with the [facility], please come in thru the Main Entrance and check in at the main information desk.  Wear comfortable clothing and clothing appropriate for easy access to any Portacath or PICC line.   We strive to give you quality time with your provider. You may need to reschedule your appointment if you arrive late (15 or more minutes).  Arriving late affects you and other patients whose appointments are after yours.  Also, if you miss three or more appointments without notifying the office, you may be dismissed from the clinic at the provider's discretion.      For prescription refill requests, have your pharmacy contact our office and allow 72 hours for refills to be completed.    Today you received the following port flush and lab draw. Imodium was order for diarrhea, return as scheduled.   To help prevent nausea and vomiting after your treatment, we encourage you to take your nausea medication as directed.  BELOW ARE SYMPTOMS THAT SHOULD BE REPORTED IMMEDIATELY: *FEVER GREATER THAN 100.4 F (38 C) OR HIGHER *CHILLS OR SWEATING *NAUSEA AND VOMITING THAT IS NOT CONTROLLED WITH YOUR NAUSEA MEDICATION *UNUSUAL SHORTNESS OF BREATH *UNUSUAL BRUISING OR BLEEDING *URINARY PROBLEMS (pain or burning when urinating, or frequent urination) *BOWEL PROBLEMS (unusual diarrhea, constipation, pain near the anus) TENDERNESS IN MOUTH AND THROAT WITH OR WITHOUT PRESENCE OF ULCERS (sore throat, sores in mouth, or a toothache) UNUSUAL RASH, SWELLING OR PAIN  UNUSUAL VAGINAL DISCHARGE OR ITCHING   Items with * indicate a potential emergency and should be followed up as soon as possible or go to the Emergency Department if any problems should occur.  Please show the CHEMOTHERAPY ALERT CARD or IMMUNOTHERAPY ALERT CARD at check-in  to the Emergency Department and triage nurse.  Should you have questions after your visit or need to cancel or reschedule your appointment, please contact [facility] [phone]  and follow the prompts.  Office hours are 8:00 a.m. to 4:30 p.m. Monday - Friday. Please note that voicemails left after 4:00 p.m. may not be returned until the following business day.  We are closed weekends and major holidays. You have access to a nurse at all times for urgent questions. Please call the main number to the clinic [phone] and follow the prompts.  For any non-urgent questions, you may also contact your provider using MyChart. We now offer e-Visits for anyone 17 and older to request care online for non-urgent symptoms. For details visit [email or web address].   Also download the MyChart app! Go to the app store, search "MyChart", open the app, select [facility], and log in with your MyChart username and password.  Masks are optional in the cancer centers. If you would like for your care team to wear a mask while they are taking care of you, please let them know. For doctor visits, patients may have with them one support person who is at least 62 years old. At this time, visitors are not allowed in the infusion area.

## 2021-10-09 NOTE — ED Provider Notes (Signed)
[facility] Provider Note   CSN: [id number] Arrival date & time: 10/09/21  2041     History {Add pertinent medical, surgical, social history, OB history to HPI:1} Chief Complaint  Patient presents with   Chest Pain    Michele Romero is a 61 y.o. female.   Chest Pain Associated symptoms: nausea and shortness of breath   Associated symptoms: no abdominal pain, no back pain, no cough, no dizziness, no headache, no numbness, no vomiting and no weakness         Michele Romero is a 61 y.o. female with past medical history significant for endometrial cancer, COPD, asthma who presents to the Emergency Department complaining of substernal chest pain since around 1 PM today.  Describes chest pain as "tightness" pain has been associated with increasing shortness of breath.  She has been having some mild discomfort of her left neck and top of her left shoulder as well.  No pain radiating down her left arm or into her left jaw.  She completed chemotherapy treatments approximately 2 months ago.  She went to the cancer center earlier today to have her port flushed.  When she returned home, chest pain increased.  She took 3 full-strength aspirin and a "gas pill" without relief.  She also endorses having some nausea, loose stools today and chills.  She denies any cough, vomiting, abdominal pain.  no recent sick contacts.    Home Medications Prior to Admission medications   Medication Sig Start Date End Date Taking? Authorizing Provider  albuterol (VENTOLIN HFA) 108 (90 Base) MCG/ACT inhaler Inhale 2 puffs into the lungs every 6 (six) hours as needed for wheezing or shortness of breath. 10/09/20   [name], NP  budesonide-formoterol (SYMBICORT) 80-4.5 MCG/ACT inhaler Inhale 2 puffs into the lungs 2 (two) times daily. 10/09/20   [name], NP  calcium carbonate (TUMS - DOSED IN MG ELEMENTAL CALCIUM) 500 MG chewable tablet Chew 1 tablet by mouth as needed for  indigestion or heartburn.    [provider]  gabapentin (NEURONTIN) 300 MG capsule Take 1 capsule (300 mg total) by mouth 3 (three) times daily. Take additional capsule at bed time for total dose '600mg'$  05/29/21   Derek Jack, MD  loperamide (IMODIUM) 2 MG capsule Take 1 capsule (2 mg total) by mouth as needed for diarrhea or loose stools (Take 2 Capsules after the 1st loose stool and then 1 capsule adter each loose stool. Do not exceed 8 capsules in a 24-hr period. If it is bedtime and you are having loose stools, take 2 capsules at bestime and then take 2 capsules every 4 hours until morning.). 10/09/21   Derek Jack, MD  loratadine (CLARITIN) 10 MG tablet Take 1 tablet (10 mg total) by mouth daily. 01/23/21   Derek Jack, MD  magnesium oxide (MAG-OX) 400 (240 Mg) MG tablet Take 1 tablet (400 mg total) by mouth in the morning, at noon, and at bedtime. 07/09/21   Derek Jack, MD  Multiple Vitamin (MULTIVITAMIN WITH MINERALS) TABS tablet Take 1 tablet by mouth daily.    [provider]  ondansetron (ZOFRAN ODT) 8 MG disintegrating tablet Take 1 tablet (8 mg total) by mouth every 8 (eight) hours as needed for nausea or vomiting. 10/10/20   Florian Buff, MD  pantoprazole (PROTONIX) 40 MG tablet Take 1 tablet (40 mg total) by mouth daily. 01/25/21   Daleen Bo, MD  potassium chloride SA (KLOR-CON M) 20 MEQ tablet  Take 1 tablet (20 mEq total) by mouth daily. 04/03/21   Derek Jack, MD  vitamin B-12 (CYANOCOBALAMIN) 500 MCG tablet Take 500 mcg by mouth daily.    [provider]      Allergies    Codeine    Review of Systems   Review of Systems  Constitutional:  Positive for appetite change and chills.  HENT:  Negative for congestion.   Respiratory:  Positive for chest tightness and shortness of breath. Negative for cough.   Cardiovascular:  Positive for chest pain. Negative for leg swelling.  Gastrointestinal:  Positive for  diarrhea and nausea. Negative for abdominal pain and vomiting.  Genitourinary:  Negative for difficulty urinating, dysuria and flank pain.  Musculoskeletal:  Negative for back pain.  Skin:  Negative for rash.  Neurological:  Negative for dizziness, facial asymmetry, weakness, numbness and headaches.    Physical Exam Updated Vital Signs BP 119/78 (BP Location: Right Arm)   Pulse 71   Temp (!) 100.5 F (38.1 C) (Oral)   Resp (!) 23   Ht '5\' 2"'$  (1.575 m)   Wt 81.2 kg   LMP 04/14/2010   SpO2 100%   BMI 32.74 kg/m  Physical Exam Vitals and nursing note reviewed.  Constitutional:      Appearance: She is not toxic-appearing.     Comments: Patient is uncomfortable appearing  Cardiovascular:     Rate and Rhythm: Normal rate and regular rhythm.     Pulses: Normal pulses.  Pulmonary:     Effort: Pulmonary effort is normal. No respiratory distress.     Breath sounds: No wheezing or rales.  Abdominal:     General: There is no distension.     Palpations: Abdomen is soft.     Tenderness: There is no abdominal tenderness.  Musculoskeletal:     Cervical back: Normal range of motion. No tenderness.     Right lower leg: No edema.     Left lower leg: No edema.  Lymphadenopathy:     Cervical: No cervical adenopathy.  Skin:    General: Skin is warm.     Capillary Refill: Capillary refill takes less than 2 seconds.     Findings: No rash.  Neurological:     General: No focal deficit present.     Mental Status: She is alert.     Sensory: No sensory deficit.     Motor: No weakness.     ED Results / Procedures / Treatments   Labs (all labs ordered are listed, but only abnormal results are displayed) Labs Reviewed  CBC - Abnormal; Notable for the following components:      Result Value   RBC 2.53 (*)    Hemoglobin 7.7 (*)    HCT 23.1 (*)    All other components within normal limits  RESP PANEL BY RT-PCR (FLU A&B, COVID) ARPGX2  LACTIC ACID, PLASMA  BASIC METABOLIC PANEL  TROPONIN  I (HIGH SENSITIVITY)    EKG None  Radiology No results found.  Procedures Procedures  {Document cardiac monitor, telemetry assessment procedure when appropriate:1}  Medications Ordered in ED Medications - No data to display  ED Course/ Medical Decision Making/ A&P                           Medical Decision Making Patient with past medical history including endometrial cancer recently completed chemotherapy and radiation.  Here for concerns of chest pain and shortness of breath.  Symptoms  began gradually around 1 PM this afternoon.  Have been persistent since onset.  Associated with nausea and chills.  On exam, patient appears uncomfortable, slightly anxious.  Vital signs no hypotension she is febrile and tachycardic.  No hypoxia.  Her lungs are clear to auscultation bilaterally.  She does have concerning history, no prior history of coronary artery disease but given symptoms she will definitely need delta troponin.  EKG without acute ischemic change.  PE would also be high on the differential as well as pneumonia.  Her port was accessed for labs, will proceed with CT angio of the chest.  Amount and/or Complexity of Data Reviewed Labs: ordered.    Details: Labs show reassuring WBC.  Hemoglobin this evening is 7.7.  It was 7.93 months ago, she did have a hemoglobin of 10.33 weeks ago.  Lactic acid reassuring. Radiology: ordered.    Details: CT angio of chest shows ECG/medicine tests: ordered.    Details: EKG without acute ischemic change   ***  {Document critical care time when appropriate:1} {Document review of labs and clinical decision tools ie heart score, Chads2Vasc2 etc:1}  {Document your independent review of radiology images, and any outside records:1} {Document your discussion with family members, caretakers, and with consultants:1} {Document social determinants of health affecting pt's care:1} {Document your decision making why or why not admission, treatments were  needed:1} Final Clinical Impression(s) / ED Diagnoses Final diagnoses:  None    Rx / DC Orders ED Discharge Orders     None

## 2021-10-09 NOTE — ED Triage Notes (Signed)
Pt with CP since earlier today, c/o mid CP.  Pt states she took some "gas pills".  Also c/o SOB  Pt was at [facility] to have port flushed per pt. And had some CP then.  Pt states she had some chills last night.

## 2021-10-09 NOTE — ED Notes (Signed)
Patient transported to CT 

## 2021-10-09 NOTE — ED Provider Notes (Incomplete)
[facility] Provider Note   CSN: [id number] Arrival date & time: 10/09/21  2041     History {Add pertinent medical, surgical, social history, OB history to HPI:1} Chief Complaint  Patient presents with  . Chest Pain    KELINA BEAUCHAMP is a 61 y.o. female.   Chest Pain Associated symptoms: nausea and shortness of breath   Associated symptoms: no abdominal pain, no back pain, no cough, no dizziness, no headache, no numbness, no vomiting and no weakness        DELAILAH SPIETH is a 61 y.o. female with past medical history significant for endometrial cancer, COPD, asthma who presents to the Emergency Department complaining of substernal chest pain since around 1 PM today.  Describes chest pain as "tightness" pain has been associated with increasing shortness of breath.  She has been having some mild discomfort of her left neck and top of her left shoulder as well.  No pain radiating down her left arm or into her left jaw.  She completed chemotherapy treatments approximately 2 months ago.  She went to the cancer center earlier today to have her port flushed.  When she returned home, chest pain increased.  She took 3 full-strength aspirin and a "gas pill" without relief.  She also endorses having some nausea, loose stools today and chills.  She denies any cough, vomiting, abdominal pain.  no recent sick contacts.    Home Medications Prior to Admission medications   Medication Sig Start Date End Date Taking? Authorizing Provider  albuterol (VENTOLIN HFA) 108 (90 Base) MCG/ACT inhaler Inhale 2 puffs into the lungs every 6 (six) hours as needed for wheezing or shortness of breath. 10/09/20   [name], NP  budesonide-formoterol (SYMBICORT) 80-4.5 MCG/ACT inhaler Inhale 2 puffs into the lungs 2 (two) times daily. 10/09/20   [name], NP  calcium carbonate (TUMS - DOSED IN MG ELEMENTAL CALCIUM) 500 MG chewable tablet Chew 1 tablet by mouth as needed for  indigestion or heartburn.    [provider]  gabapentin (NEURONTIN) 300 MG capsule Take 1 capsule (300 mg total) by mouth 3 (three) times daily. Take additional capsule at bed time for total dose '600mg'$  05/29/21   Derek Jack, MD  loperamide (IMODIUM) 2 MG capsule Take 1 capsule (2 mg total) by mouth as needed for diarrhea or loose stools (Take 2 Capsules after the 1st loose stool and then 1 capsule adter each loose stool. Do not exceed 8 capsules in a 24-hr period. If it is bedtime and you are having loose stools, take 2 capsules at bestime and then take 2 capsules every 4 hours until morning.). 10/09/21   Derek Jack, MD  loratadine (CLARITIN) 10 MG tablet Take 1 tablet (10 mg total) by mouth daily. 01/23/21   Derek Jack, MD  magnesium oxide (MAG-OX) 400 (240 Mg) MG tablet Take 1 tablet (400 mg total) by mouth in the morning, at noon, and at bedtime. 07/09/21   Derek Jack, MD  Multiple Vitamin (MULTIVITAMIN WITH MINERALS) TABS tablet Take 1 tablet by mouth daily.    [provider]  ondansetron (ZOFRAN ODT) 8 MG disintegrating tablet Take 1 tablet (8 mg total) by mouth every 8 (eight) hours as needed for nausea or vomiting. 10/10/20   Florian Buff, MD  pantoprazole (PROTONIX) 40 MG tablet Take 1 tablet (40 mg total) by mouth daily. 01/25/21   Daleen Bo, MD  potassium chloride SA (KLOR-CON M) 20 MEQ tablet Take  1 tablet (20 mEq total) by mouth daily. 04/03/21   Derek Jack, MD  vitamin B-12 (CYANOCOBALAMIN) 500 MCG tablet Take 500 mcg by mouth daily.    [provider]      Allergies    Codeine    Review of Systems   Review of Systems  Constitutional:  Positive for appetite change and chills.  HENT:  Negative for congestion.   Respiratory:  Positive for chest tightness and shortness of breath. Negative for cough.   Cardiovascular:  Positive for chest pain. Negative for leg swelling.  Gastrointestinal:  Positive for  diarrhea and nausea. Negative for abdominal pain and vomiting.  Genitourinary:  Negative for difficulty urinating, dysuria and flank pain.  Musculoskeletal:  Negative for back pain.  Skin:  Negative for rash.  Neurological:  Negative for dizziness, facial asymmetry, weakness, numbness and headaches.    Physical Exam Updated Vital Signs BP 119/78 (BP Location: Right Arm)   Pulse 71   Temp (!) 100.5 F (38.1 C) (Oral)   Resp (!) 23   Ht '5\' 2"'$  (1.575 m)   Wt 81.2 kg   LMP 04/14/2010   SpO2 100%   BMI 32.74 kg/m  Physical Exam Vitals and nursing note reviewed.  Constitutional:      Appearance: She is not toxic-appearing.     Comments: Patient is uncomfortable appearing  Cardiovascular:     Rate and Rhythm: Normal rate and regular rhythm.     Pulses: Normal pulses.  Pulmonary:     Effort: Pulmonary effort is normal. No respiratory distress.     Breath sounds: No wheezing or rales.  Abdominal:     General: There is no distension.     Palpations: Abdomen is soft.     Tenderness: There is no abdominal tenderness.  Musculoskeletal:     Cervical back: Normal range of motion. No tenderness.     Right lower leg: No edema.     Left lower leg: No edema.  Lymphadenopathy:     Cervical: No cervical adenopathy.  Skin:    General: Skin is warm.     Capillary Refill: Capillary refill takes less than 2 seconds.     Findings: No rash.  Neurological:     General: No focal deficit present.     Mental Status: She is alert.     Sensory: No sensory deficit.     Motor: No weakness.     ED Results / Procedures / Treatments   Labs (all labs ordered are listed, but only abnormal results are displayed) Labs Reviewed  BASIC METABOLIC PANEL - Abnormal; Notable for the following components:      Result Value   Sodium 134 (*)    Potassium 3.2 (*)    CO2 21 (*)    Glucose, Bld 116 (*)    Calcium 8.4 (*)    All other components within normal limits  CBC - Abnormal; Notable for the  following components:   RBC 2.53 (*)    Hemoglobin 7.7 (*)    HCT 23.1 (*)    All other components within normal limits  RESP PANEL BY RT-PCR (FLU A&B, COVID) ARPGX2  CULTURE, BLOOD (ROUTINE X 2)  CULTURE, BLOOD (ROUTINE X 2)  URINE CULTURE  LACTIC ACID, PLASMA  PROTIME-INR  APTT  URINALYSIS, ROUTINE W REFLEX MICROSCOPIC  TROPONIN I (HIGH SENSITIVITY)  TROPONIN I (HIGH SENSITIVITY)    EKG EKG Interpretation  Date/Time:  Wednesday October 09 2021 20:52:22 EDT Ventricular Rate:  132 PR  Interval:  128 QRS Duration: 66 QT Interval:  380 QTC Calculation: 563 R Axis:   70 Text Interpretation: *** Critical Test Result: Long QTc Sinus tachycardia Nonspecific T wave abnormality Abnormal ECG When compared with ECG of 17-Sep-2021 12:50, PREVIOUS ECG IS PRESENT Since last tracing rate faster Confirmed by Noemi Chapel [phone]) on 10/09/2021 11:14:45 PM  Radiology CT Angio Chest PE W and/or Wo Contrast  Result Date: 10/09/2021 CLINICAL DATA:  Shortness of breath and chest pain EXAM: CT ANGIOGRAPHY CHEST WITH CONTRAST TECHNIQUE: Multidetector CT imaging of the chest was performed using the standard protocol during bolus administration of intravenous contrast. Multiplanar CT image reconstructions and MIPs were obtained to evaluate the vascular anatomy. RADIATION DOSE REDUCTION: This exam was performed according to the departmental dose-optimization program which includes automated exposure control, adjustment of the mA and/or kV according to patient size and/or use of iterative reconstruction technique. CONTRAST:  19m OMNIPAQUE IOHEXOL 350 MG/ML SOLN COMPARISON:  CT 09/17/2021 FINDINGS: Cardiovascular: Satisfactory opacification of the pulmonary arteries to the segmental level. No evidence of pulmonary embolism. Normal heart size. No pericardial effusion. Nonaneurysmal aorta. No dissection. Mild atherosclerosis. Right-sided central venous catheter tip at the cavoatrial region. Normal cardiac size. No  pericardial effusion Mediastinum/Nodes: No enlarged mediastinal, hilar, or axillary lymph nodes. Thyroid gland, trachea, and esophagus demonstrate no significant findings. Lungs/Pleura: Lungs are clear. No pleural effusion or pneumothorax. Upper Abdomen: No acute abnormality. Musculoskeletal: No chest wall abnormality. No acute or significant osseous findings. Review of the MIP images confirms the above findings. IMPRESSION: 1. No CT evidence for acute pulmonary embolus or aortic dissection. 2. Clear lung fields Aortic Atherosclerosis (ICD10-I70.0). Electronically Signed   By: KDonavan FoilM.D.   On: 10/09/2021 22:35    Procedures Procedures  {Document cardiac monitor, telemetry assessment procedure when appropriate:1}  Medications Ordered in ED Medications  lactated ringers infusion (has no administration in time range)  lactated ringers bolus 1,000 mL (1,000 mLs Intravenous New Bag/Given 10/09/21 2315)    And  lactated ringers bolus 1,000 mL (1,000 mLs Intravenous New Bag/Given 10/09/21 2317)    And  lactated ringers bolus 500 mL (has no administration in time range)  ceFEPIme (MAXIPIME) 2 g in sodium chloride 0.9 % 100 mL IVPB (2 g Intravenous New Bag/Given 10/09/21 2318)  metroNIDAZOLE (FLAGYL) IVPB 500 mg (has no administration in time range)  vancomycin (VANCOCIN) IVPB 1000 mg/200 mL premix (has no administration in time range)  iohexol (OMNIPAQUE) 350 MG/ML injection 100 mL (100 mLs Intravenous Contrast Given 10/09/21 2214)    ED Course/ Medical Decision Making/ A&P                           Medical Decision Making Patient with past medical history including endometrial cancer recently completed chemotherapy and radiation.  Here for concerns of chest pain and shortness of breath.  Symptoms began gradually around 1 PM this afternoon.  Have been persistent since onset.  Associated with nausea and chills.  Patient here with fever, chest pain and tachycardia.  Symptoms carry high risk of  complications and morbidity.  Possible sepsis.  Source at this time unclear.  Sepsis protocol initiated.  On exam, patient appears uncomfortable, slightly anxious.  Vital signs no hypotension she is febrile and tachycardic.  No hypoxia.  Her lungs are clear to auscultation bilaterally.  She does have concerning history, no prior history of coronary artery disease but given symptoms she will definitely need delta troponin.  EKG without acute ischemic change.  PE would also be high on the differential as well as pneumonia.  Her port was accessed for labs, will proceed with CT angio of the chest.  Amount and/or Complexity of Data Reviewed Labs: ordered.    Details: Labs show reassuring WBC.  Hemoglobin this evening is 7.7.  It was 7.9 three months ago, she did have a hemoglobin of 10.3  three weeks ago.  Lactic acid reassuring.  COVID test negative.  Chemistries show mild hypokalemia kidney functions unremarkable.  Initial troponin 3 Radiology: ordered.    Details: CT angio of chest shows no evidence of acute pulmonary embolus or aortic dissection. ECG/medicine tests: ordered.    Details: EKG shows long QT C with sinus tachycardia Discussion of management or test interpretation with external provider(s): Sepsis protocol initiated, source unclear at this time.  Patient started on LR, cefepime, vancomycin and Flagyl.  Discussed findings with overnight provider, Dr. Stark Jock who assumes care of pt.      Risk Prescription drug management.     {Document critical care time when appropriate:1} {Document review of labs and clinical decision tools ie heart score, Chads2Vasc2 etc:1}  {Document your independent review of radiology images, and any outside records:1} {Document your discussion with family members, caretakers, and with consultants:1} {Document social determinants of health affecting pt's care:1} {Document your decision making why or why not admission, treatments were needed:1} Final Clinical  Impression(s) / ED Diagnoses Final diagnoses:  None    Rx / DC Orders ED Discharge Orders     None

## 2021-10-10 LAB — URINALYSIS, ROUTINE W REFLEX MICROSCOPIC
Bilirubin Urine: NEGATIVE
Glucose, UA: NEGATIVE mg/dL
Hgb urine dipstick: NEGATIVE
Ketones, ur: 20 mg/dL — AB
Leukocytes,Ua: NEGATIVE
Nitrite: NEGATIVE
Protein, ur: NEGATIVE mg/dL
Specific Gravity, Urine: 1.042 — ABNORMAL HIGH (ref 1.005–1.030)
pH: 7 (ref 5.0–8.0)

## 2021-10-10 LAB — HEPATIC FUNCTION PANEL
ALT: 9 U/L (ref 0–44)
AST: 13 U/L — ABNORMAL LOW (ref 15–41)
Albumin: 3 g/dL — ABNORMAL LOW (ref 3.5–5.0)
Alkaline Phosphatase: 56 U/L (ref 38–126)
Bilirubin, Direct: 0.2 mg/dL (ref 0.0–0.2)
Indirect Bilirubin: 0.5 mg/dL (ref 0.3–0.9)
Total Bilirubin: 0.7 mg/dL (ref 0.3–1.2)
Total Protein: 7.8 g/dL (ref 6.5–8.1)

## 2021-10-10 MED ORDER — HEPARIN SOD (PORK) LOCK FLUSH 100 UNIT/ML IV SOLN
500.0000 [IU] | Freq: Once | INTRAVENOUS | Status: AC
  Administered 2021-10-10: 500 [IU]
  Filled 2021-10-10: qty 5

## 2021-10-10 NOTE — Discharge Instructions (Signed)
Take Tylenol 1000 mg rotated with ibuprofen 600 mg every 4 hours as needed for fever.  Return to the emergency department if you develop worsening chest pain, difficulty breathing, fevers greater than 104, or for other new and concerning symptoms.  We will call you if your cultures indicate you require further treatment or need to take additional action.

## 2021-10-11 LAB — URINE CULTURE: Culture: NO GROWTH

## 2021-10-14 LAB — CULTURE, BLOOD (ROUTINE X 2)
Culture: NO GROWTH
Culture: NO GROWTH
Special Requests: ADEQUATE
Special Requests: ADEQUATE

## 2021-10-16 ENCOUNTER — Inpatient Hospital Stay (HOSPITAL_COMMUNITY): Payer: 59 | Attending: Hematology | Admitting: Hematology

## 2021-10-16 VITALS — HR 110 | Temp 99.1°F | Resp 18

## 2021-10-16 DIAGNOSIS — R112 Nausea with vomiting, unspecified: Secondary | ICD-10-CM | POA: Insufficient documentation

## 2021-10-16 DIAGNOSIS — C541 Malignant neoplasm of endometrium: Secondary | ICD-10-CM | POA: Diagnosis not present

## 2021-10-16 LAB — IRON AND TIBC
Iron: 21 ug/dL — ABNORMAL LOW (ref 28–170)
Saturation Ratios: 10 % — ABNORMAL LOW (ref 10.4–31.8)
TIBC: 215 ug/dL — ABNORMAL LOW (ref 250–450)
UIBC: 194 ug/dL

## 2021-10-16 LAB — FERRITIN: Ferritin: 511 ng/mL — ABNORMAL HIGH (ref 11–307)

## 2021-10-16 LAB — FOLATE: Folate: 11.7 ng/mL (ref >5.9)

## 2021-10-16 LAB — VITAMIN B12: Vitamin B-12: 703 pg/mL (ref 180–914)

## 2021-10-16 MED ORDER — GABAPENTIN 300 MG PO CAPS: 300.0000 mg | ORAL_CAPSULE | Freq: Three times a day (TID) | ORAL | 6 refills | Status: DC

## 2021-10-16 MED ORDER — POTASSIUM CHLORIDE CRYS ER 20 MEQ PO TBCR: 20.0000 meq | EXTENDED_RELEASE_TABLET | Freq: Every day | ORAL | 6 refills | Status: DC

## 2021-10-16 MED ORDER — MAGNESIUM OXIDE -MG SUPPLEMENT 400 (240 MG) MG PO TABS: 400.0000 mg | ORAL_TABLET | Freq: Three times a day (TID) | ORAL | 6 refills | Status: DC

## 2021-10-16 MED ORDER — SODIUM CHLORIDE 0.9% FLUSH
10.0000 mL | Freq: Once | INTRAVENOUS | Status: AC
  Administered 2021-10-16: 10 mL via INTRAVENOUS

## 2021-10-16 MED ORDER — HEPARIN SOD (PORK) LOCK FLUSH 100 UNIT/ML IV SOLN
500.0000 [IU] | Freq: Once | INTRAVENOUS | Status: AC
  Administered 2021-10-16: 500 [IU] via INTRAVENOUS

## 2021-10-16 NOTE — Progress Notes (Signed)
[place] [address], [place] [address]   CLINIC:  Medical Oncology/Hematology  PCP:  Pcp, No None None   REASON FOR VISIT:  Follow-up for endometrial cancer  PRIOR THERAPY: Carboplatin and paclitaxel every 3 weeks for 6 cycles  NGS Results: not done  CURRENT THERAPY: Surveillance  BRIEF ONCOLOGIC HISTORY:  Oncology History  Endometrial cancer ([place])  11/15/2020 Initial Diagnosis   Endometrial cancer ([place])   01/23/2021 -  Chemotherapy   Patient is on Treatment Plan : UTERINE Carboplatin AUC 6 / Paclitaxel q21d       CANCER STAGING: Cancer Staging  Endometrial cancer ([place]) Staging form: Corpus Uteri - Carcinoma and Carcinosarcoma, AJCC 8th Edition - Clinical stage from 12/31/2020: FIGO Stage IIIC1 (cT1a, cN34mi, cM0) - Unsigned   INTERVAL HISTORY:  [name], a 61 y.o. female, returns for routine follow-up of her endometrial cancer. Michele Romero was last seen on 07/09/2021.   Today she reports feeling fair. She reports SOB starting 6/28 and vomiting starting 7/1; she reports SOB occurs even when seated accompanied by chest tightness. She reports diarrhea and constipation. She denies bleeding and black stools. She reports swelling in her legs. She continues to take Gabapentin, and she continues to take Magnesium TID. The drawing in her hands and feet are stable, and she continues to be unable to walk. She is able to transfer herself from her wheelchair.   REVIEW OF SYSTEMS:  Review of Systems  Constitutional:  Positive for appetite change and fatigue.  HENT:   Negative for nosebleeds.   Respiratory:  Positive for chest tightness and shortness of breath. Negative for hemoptysis.   Cardiovascular:  Positive for chest pain (4/10) and leg swelling.  Gastrointestinal:  Positive for constipation, diarrhea, nausea and vomiting. Negative for blood in stool.  Genitourinary:  Negative for hematuria and vaginal bleeding.   Musculoskeletal:   Positive for arthralgias (4/10 legs).  Neurological:  Positive for numbness.  Psychiatric/Behavioral:  Positive for sleep disturbance.   All other systems reviewed and are negative.   PAST MEDICAL/SURGICAL HISTORY:  Past Medical History:  Diagnosis Date   Ambulates with cane 10/31/2020   Arthritis    RIGHT HIP   Asthma    Cigarette nicotine dependence without complication 11/91/4782   Complex tear of medial meniscus of right knee 01/20/2017   COPD (chronic obstructive pulmonary disease) ([place])    ENDOMETRIAL CANCER 10/31/2020   Endometrial cancer ([place]) 11/15/2020   GERD (gastroesophageal reflux disease)    History of radiation therapy    HDR brachytherapy [place] 05/08/2021-06/03/2021  Dr Gery Pray   Migraines    Port-A-Cath in place 01/01/2021   Past Surgical History:  Procedure Laterality Date   APPENDECTOMY     YRS AGO PER PT ON 10-31-2020   BIOPSY  11/29/2019   Procedure: BIOPSY;  Surgeon: Harvel Quale, MD;  [facility];  Service: Gastroenterology;;  ileocecal vlve   CHOLECYSTECTOMY     YRS AGO PER PT ON 10-31-2020   COLONOSCOPY WITH PROPOFOL N/A 11/29/2019   Procedure: COLONOSCOPY WITH PROPOFOL;  Surgeon: Harvel Quale, MD;  Location: [facility];  Service: Gastroenterology;  Laterality: N/A;  930   HYSTEROSCOPY WITH D & C N/A 10/10/2020   Procedure: DILATATION AND CURETTAGE /HYSTEROSCOPY;  Surgeon: Florian Buff, MD;  Location: [facility];  Service: Gynecology;  Laterality: N/A;   IR IMAGING GUIDED PORT INSERTION  01/08/2021   KNEE ARTHROPLASTY  2018   left knee  POLYPECTOMY  11/29/2019   Procedure: POLYPECTOMY;  Surgeon: Montez Morita, Quillian Quince, MD;  Location[facility];  Service: Gastroenterology;;  sigmoid colon    ROBOTIC ASSISTED TOTAL HYSTERECTOMY WITH BILATERAL SALPINGO OOPHERECTOMY N/A 11/15/2020   Procedure: XI ROBOTIC ASSISTED TOTAL HYSTERECTOMY GREATER THAN TWO HUNDRED AND FIFTY GRAMS WITH BILATERAL SALPINGO OOPHORECTOMY,  MINI LAPAROTOMY;  Surgeon: Everitt Amber, MD;  Location: [facility];  Service: Gynecology;  Laterality: N/A;   SENTINEL NODE BIOPSY N/A 11/15/2020   Procedure: SENTINEL NODE BIOPSY;  Surgeon: Everitt Amber, MD;  Location: [facility];  Service: Gynecology;  Laterality: N/A;   TUBAL LIGATION  04/14/1985    SOCIAL HISTORY:  Social History   Socioeconomic History   Marital status: Single    Spouse name: Not on file   Number of children: 2   Years of education: Not on file   Highest education level: High school graduate  Occupational History   Not on file  Tobacco Use   Smoking status: Former    Packs/day: 0.50    Years: 30.00    Total pack years: 15.00    Types: Cigarettes    Quit date: 01/01/2020    Years since quitting: 1.7    Passive exposure: Never   Smokeless tobacco: Never  Vaping Use   Vaping Use: Never used  Substance and Sexual Activity   Alcohol use: No   Drug use: Yes    Frequency: 6.0 times per week    Types: Marijuana    Comment: every now and then   Sexual activity: Not Currently    Birth control/protection: Surgical    Comment: tubal  Other Topics Concern   Not on file  Social History Narrative   Lives with daughter and 1 grandbaby   Son is in [place]      Enjoys: sitting outside, music, tv      Diet: eats all food groups   Caffeine: soda and tea: 2-3 cups daily   Water: 2-3 16 oz bottles      Wears seat belt    Does not use phone while driving   Smoke detectors at home    No weapons    Social Determinants of Health   Financial Resource Strain: High Risk (01/22/2021)   Overall Financial Resource Strain (CARDIA)    Difficulty of Paying Living Expenses: Hard  Food Insecurity: No Food Insecurity (01/22/2021)   Hunger Vital Sign    Worried About Running Out of Food in the Last Year: Never true    Star in the Last Year: Never true  Transportation Needs: Unmet Transportation Needs (01/22/2021)   PRAPARE -  Transportation    Lack of Transportation (Medical): Yes    Lack of Transportation (Non-Medical): Yes  Physical Activity: Inactive (10/28/2019)   Exercise Vital Sign    Days of Exercise per Week: 0 days    Minutes of Exercise per Session: 0 min  Stress: Stress Concern Present (01/22/2021)   [place]    Feeling of Stress : To some extent  Social Connections: Unknown (01/22/2021)   Social Connection and Isolation Panel [NHANES]    Frequency of Communication with Friends and Family: More than three times a week    Frequency of Social Gatherings with Friends and Family: More than three times a week    Attends Religious Services: More than 4 times per year    Active Member of [employer] or Organizations: Not on  file    Attends Club or Organization Meetings: Not on file    Marital Status: Not on file  Intimate Partner Violence: Not At Risk (10/28/2019)   Humiliation, Afraid, Rape, and Kick questionnaire    Fear of Current or Ex-Partner: No    Emotionally Abused: No    Physically Abused: No    Sexually Abused: No    FAMILY HISTORY:  Family History  Problem Relation Age of Onset   Diabetes Mother    Hypertension Mother    Heart disease Mother        CHF   Stroke Mother    Heart disease Father    Colon cancer Brother    Breast cancer Neg Hx    Ovarian cancer Neg Hx    Endometrial cancer Neg Hx    Prostate cancer Neg Hx    Pancreatic cancer Neg Hx     CURRENT MEDICATIONS:  Current Outpatient Medications  Medication Sig Dispense Refill   albuterol (VENTOLIN HFA) 108 (90 Base) MCG/ACT inhaler Inhale 2 puffs into the lungs every 6 (six) hours as needed for wheezing or shortness of breath. 8 g 2   budesonide-formoterol (SYMBICORT) 80-4.5 MCG/ACT inhaler Inhale 2 puffs into the lungs 2 (two) times daily. 10.2 g 2   calcium carbonate (TUMS - DOSED IN MG ELEMENTAL CALCIUM) 500 MG chewable tablet Chew 1 tablet by mouth as  needed for indigestion or heartburn.     gabapentin (NEURONTIN) 300 MG capsule Take 1 capsule (300 mg total) by mouth 3 (three) times daily. Take additional capsule at bed time for total dose 649m 120 capsule 3   loperamide (IMODIUM) 2 MG capsule Take 1 capsule (2 mg total) by mouth as needed for diarrhea or loose stools (Take 2 Capsules after the 1st loose stool and then 1 capsule adter each loose stool. Do not exceed 8 capsules in a 24-hr period. If it is bedtime and you are having loose stools, take 2 capsules at bestime and then take 2 capsules every 4 hours until morning.). 30 capsule 0   loratadine (CLARITIN) 10 MG tablet Take 1 tablet (10 mg total) by mouth daily. 30 tablet 2   magnesium oxide (MAG-OX) 400 (240 Mg) MG tablet Take 1 tablet (400 mg total) by mouth in the morning, at noon, and at bedtime. 90 tablet 3   Multiple Vitamin (MULTIVITAMIN WITH MINERALS) TABS tablet Take 1 tablet by mouth daily.     ondansetron (ZOFRAN ODT) 8 MG disintegrating tablet Take 1 tablet (8 mg total) by mouth every 8 (eight) hours as needed for nausea or vomiting. 8 tablet 0   pantoprazole (PROTONIX) 40 MG tablet Take 1 tablet (40 mg total) by mouth daily. 30 tablet 1   potassium chloride SA (KLOR-CON M) 20 MEQ tablet Take 1 tablet (20 mEq total) by mouth daily. 30 tablet 3   vitamin B-12 (CYANOCOBALAMIN) 500 MCG tablet Take 500 mcg by mouth daily.     No current facility-administered medications for this visit.    ALLERGIES:  Allergies  Allergen Reactions   Codeine Nausea And Vomiting and Rash    PHYSICAL EXAM:  Performance status (ECOG): 1 - Symptomatic but completely ambulatory  There were no vitals filed for this visit. Wt Readings from Last 3 Encounters:  10/09/21 179 lb (81.2 kg)  09/17/21 179 lb (81.2 kg)  09/12/21 171 lb 8.3 oz (77.8 kg)   Physical Exam Vitals reviewed.  Constitutional:      Appearance: Normal appearance.  Comments: In wheelchair  Cardiovascular:     Rate and  Rhythm: Normal rate and regular rhythm.     Pulses: Normal pulses.     Heart sounds: Normal heart sounds.  Pulmonary:     Effort: Pulmonary effort is normal.     Breath sounds: Normal breath sounds.  Musculoskeletal:     Right lower leg: No edema.     Left lower leg: No edema.  Skin:    Comments: Ankles cold to touch  Neurological:     General: No focal deficit present.     Mental Status: She is alert and oriented to person, place, and time.  Psychiatric:        Mood and Affect: Mood normal.        Behavior: Behavior normal.      LABORATORY DATA:  I have reviewed the labs as listed.     Latest Ref Rng & Units 10/09/2021    9:33 PM 10/09/2021   12:51 PM 09/17/2021    1:01 PM  CBC  WBC 4.0 - 10.5 K/uL 9.7  8.8  10.7   Hemoglobin 12.0 - 15.0 g/dL 7.7  7.7  10.3   Hematocrit 36.0 - 46.0 % 23.1  23.8  31.2   Platelets 150 - 400 K/uL 324  332  219       Latest Ref Rng & Units 10/09/2021   10:52 PM 10/09/2021    9:33 PM 10/09/2021   12:51 PM  CMP  Glucose 70 - 99 mg/dL  116  113   BUN 8 - 23 mg/dL  11  11   Creatinine 0.44 - 1.00 mg/dL  0.65  0.53   Sodium 135 - 145 mmol/L  134  135   Potassium 3.5 - 5.1 mmol/L  3.2  3.6   Chloride 98 - 111 mmol/L  102  103   CO2 22 - 32 mmol/L  21  23   Calcium 8.9 - 10.3 mg/dL  8.4  8.5   Total Protein 6.5 - 8.1 g/dL 7.8   7.3   Total Bilirubin 0.3 - 1.2 mg/dL 0.7   0.6   Alkaline Phos 38 - 126 U/L 56   55   AST 15 - 41 U/L 13   10   ALT 0 - 44 U/L 9   7     DIAGNOSTIC IMAGING:  I have independently reviewed the scans and discussed with the patient. CT Angio Chest PE W and/or Wo Contrast  Result Date: 10/09/2021 CLINICAL DATA:  Shortness of breath and chest pain EXAM: CT ANGIOGRAPHY CHEST WITH CONTRAST TECHNIQUE: Multidetector CT imaging of the chest was performed using the standard protocol during bolus administration of intravenous contrast. Multiplanar CT image reconstructions and MIPs were obtained to evaluate the vascular anatomy.  RADIATION DOSE REDUCTION: This exam was performed according to the departmental dose-optimization program which includes automated exposure control, adjustment of the mA and/or kV according to patient size and/or use of iterative reconstruction technique. CONTRAST:  176m OMNIPAQUE IOHEXOL 350 MG/ML SOLN COMPARISON:  CT 09/17/2021 FINDINGS: Cardiovascular: Satisfactory opacification of the pulmonary arteries to the segmental level. No evidence of pulmonary embolism. Normal heart size. No pericardial effusion. Nonaneurysmal aorta. No dissection. Mild atherosclerosis. Right-sided central venous catheter tip at the cavoatrial region. Normal cardiac size. No pericardial effusion Mediastinum/Nodes: No enlarged mediastinal, hilar, or axillary lymph nodes. Thyroid gland, trachea, and esophagus demonstrate no significant findings. Lungs/Pleura: Lungs are clear. No pleural effusion or pneumothorax. Upper Abdomen: No acute abnormality. Musculoskeletal:  No chest wall abnormality. No acute or significant osseous findings. Review of the MIP images confirms the above findings. IMPRESSION: 1. No CT evidence for acute pulmonary embolus or aortic dissection. 2. Clear lung fields Aortic Atherosclerosis (ICD10-I70.0). Electronically Signed   By: Donavan Foil M.D.   On: 10/09/2021 22:35   CT Angio Chest PE W and/or Wo Contrast  Result Date: 09/17/2021 CLINICAL DATA:  Pulmonary embolism (PE) suspected, positive D-dimer EXAM: CT ANGIOGRAPHY CHEST WITH CONTRAST TECHNIQUE: Multidetector CT imaging of the chest was performed using the standard protocol during bolus administration of intravenous contrast. Multiplanar CT image reconstructions and MIPs were obtained to evaluate the vascular anatomy. RADIATION DOSE REDUCTION: This exam was performed according to the departmental dose-optimization program which includes automated exposure control, adjustment of the mA and/or kV according to patient size and/or use of iterative reconstruction  technique. CONTRAST:  3m OMNIPAQUE IOHEXOL 350 MG/ML SOLN COMPARISON:  None Available. FINDINGS: Cardiovascular: Satisfactory opacification of the pulmonary arteries to the segmental level. No evidence of pulmonary embolism. Normal heart size. No pericardial effusion. Thoracic aorta is normal in caliber with mild calcified plaque. Right chest wall port catheter tip is at the cavoatrial junction. Mediastinum/Nodes: No enlarged nodes. Included thyroid is unremarkable. Esophagus is unremarkable. Lungs/Pleura: No consolidation or mass. No pleural effusion or pneumothorax. Upper Abdomen: No acute abnormality. Musculoskeletal: Degenerative changes of the thoracic spine. Review of the MIP images confirms the above findings. IMPRESSION: No acute pulmonary embolism or other acute abnormality. Electronically Signed   By: PMacy MisM.D.   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 - Endometrial biopsy on 10/10/2020 consistent with carcinosarcoma, MMR preserved. - CT scan of the abdomen and pelvis with contrast on 10/29/2020 with endometrial mass, no compelling findings of metastatic disease in the abdomen or pelvis.  30 cm fatty lesion intraluminally in the distal transverse colon probably lipoma. - Robotic assisted laparoscopic total hysterectomy with bilateral salpingo-oophorectomy and sentinel lymph node biopsy by Dr. RDenman Georgeon 11/15/2020. - Pathology consistent with carcinosarcoma spanning 8.5 cm, tumor invades less than one half of myometrium, LVI positive, 1 lymph node involved with micrometastasis, PT1APN1 MI, MMR preserved, MSI-stable - She was evaluated by Dr. RDenman Georgeand 6 cycles of adjuvant chemotherapy with carboplatin and paclitaxel with vaginal brachytherapy was recommended. -Carboplatin and paclitaxel from 01/23/2021, paclitaxel discontinued during cycle 5 on 6 secondary to severe neuropathy.  Last  cycle of carboplatin on 06/10/2021  2.  Social/family history: - Lives at home with her daughter and granddaughter.  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  She quit smoking in September 2021.  Half pack per day smoker for 30 years.  She has severe right hip arthritis, requiring her to use a cane for ambulation. - No family history of malignancies.   PLAN:  Stage III C1 (PT1APN1A) endometrial carcinosarcoma: -CTAP (07/02/2021): No evidence of recurrence or metastatic disease.  Unchanged prominent bilateral inguinal lymph nodes nonspecific. - She was evaluated by Dr. TBerline Lopesin the first week of June.  A biopsy of the vaginal ulcer was negative for malignancy. - She was evaluated in the ER on 09/17/2021 and 10/09/2021 with shortness of breath and chest pain. - I have reviewed CT angiogram on both days, negative for pulmonary embolism.  Clear lung fields. - I have also reviewed labs which showed normal LFTs but severe anemia. -  I have recommended follow-up in 3 months with repeat CTAP with contrast along with the tumor marker and routine labs.  2.  Normocytic anemia: -She continues to be anemic with hemoglobin 7.7 and normal MCV.  Denies any bleeding per rectum. - We will check anemia panel including ferritin, iron panel, T36, folic acid, MMA, copper, SPEP, SFLC and stool for occult blood. - We talked about initiating her on parenteral iron therapy if iron levels are severely low.  3.  Hypomagnesemia: -Continue magnesium 3 times daily.  Magnesium level is normal.  4.  Hypokalemia: -Continue potassium 20 mEq daily.  5.  Peripheral neuropathy: -She has developed severe neuropathy with contracture in the right hand.  She is also unable to walk and is wheelchair dependent. - Continue gabapentin 300 mg-300 mg - 600 mg.   Orders placed this encounter:  No orders of the defined types were placed in this encounter.    Derek Jack,  MD [place] [phone]   I, Thana Ates, am acting as a scribe for Dr. Derek Jack.  I, Derek Jack MD, have reviewed the above documentation for accuracy and completeness, and I agree with the above.

## 2021-10-16 NOTE — Progress Notes (Signed)
Michele Romero presented for Portacath access and flush. Portacath located right chest wall accessed with  H 20 needle. Good blood return present. Labs drawn from patient's portacath without incident. Portacath flushed with 100m NS and 500U/566mHeparin and needle removed intact. Procedure tolerated well and without incident. Patient discharged home in wheelchair in ambulatory condition.

## 2021-10-16 NOTE — Patient Instructions (Signed)
[place] at [facility] Discharge Instructions  You were seen and examined today by Dr. Delton Coombes.  Dr. Delton Coombes discussed your most recent lab work and your hemoglobin is low 7.7 this could be causing you to be short of breath and weak.  Follow-up as scheduled 3 months.    Thank you for choosing [place] at [facility] to provide your oncology and hematology care.  To afford each patient quality time with our provider, please arrive at least 15 minutes before your scheduled appointment time.   If you have a lab appointment with the [facility] please come in thru the Main Entrance and check in at the main information desk.  You need to re-schedule your appointment should you arrive 10 or more minutes late.  We strive to give you quality time with our providers, and arriving late affects you and other patients whose appointments are after yours.  Also, if you no show three or more times for appointments you may be dismissed from the clinic at the providers discretion.     Again, thank you for choosing [facility].  Our hope is that these requests will decrease the amount of time that you wait before being seen by our physicians.       _____________________________________________________________  Should you have questions after your visit to [facility], please contact our office at [phone] and follow the prompts.  Our office hours are 8:00 a.m. and 4:30 p.m. Monday - Friday.  Please note that voicemails left after 4:00 p.m. may not be returned until the following business day.  We are closed weekends and major holidays.  You do have access to a nurse 24-7, just call the main number to the clinic [phone] and do not press any options, hold on the line and a nurse will answer the phone.    For prescription refill requests, have your pharmacy contact our office and allow 72 hours.    Due to Covid, you  will need to wear a mask upon entering the hospital. If you do not have a mask, a mask will be given to you at the Main Entrance upon arrival. For doctor visits, patients may have 1 support person age 42 or older with them. For treatment visits, patients can not have anyone with them due to social distancing guidelines and our immunocompromised population.

## 2021-10-17 LAB — KAPPA/LAMBDA LIGHT CHAINS
Kappa free light chain: 48 mg/L — ABNORMAL HIGH (ref 3.3–19.4)
Kappa, lambda light chain ratio: 1.89 — ABNORMAL HIGH (ref 0.26–1.65)
Lambda free light chains: 25.4 mg/L (ref 5.7–26.3)

## 2021-10-18 LAB — PROTEIN ELECTROPHORESIS, SERUM
A/G Ratio: 0.8 (ref 0.7–1.7)
Albumin ELP: 2.8 g/dL — ABNORMAL LOW (ref 2.9–4.4)
Alpha-1-Globulin: 0.5 g/dL — ABNORMAL HIGH (ref 0.0–0.4)
Alpha-2-Globulin: 0.8 g/dL (ref 0.4–1.0)
Beta Globulin: 1 g/dL (ref 0.7–1.3)
Gamma Globulin: 1.4 g/dL (ref 0.4–1.8)
Globulin, Total: 3.7 g/dL (ref 2.2–3.9)
Total Protein ELP: 6.5 g/dL (ref 6.0–8.5)

## 2021-10-19 LAB — COPPER, SERUM: Copper: 144 ug/dL (ref 80–158)

## 2021-10-19 LAB — METHYLMALONIC ACID, SERUM: Methylmalonic Acid, Quantitative: 151 nmol/L (ref 0–378)

## 2021-10-25 ENCOUNTER — Emergency Department (HOSPITAL_COMMUNITY): Payer: 59

## 2021-10-25 ENCOUNTER — Other Ambulatory Visit: Payer: Self-pay

## 2021-10-25 ENCOUNTER — Encounter (HOSPITAL_COMMUNITY): Payer: Self-pay | Admitting: Hematology

## 2021-10-25 ENCOUNTER — Inpatient Hospital Stay (HOSPITAL_COMMUNITY)
Admission: EM | Admit: 2021-10-25 | Discharge: 2021-10-27 | DRG: 392 | Disposition: A | Discharge status: Home / Self Care | Payer: 59 | Attending: Internal Medicine | Admitting: Internal Medicine

## 2021-10-25 ENCOUNTER — Encounter (HOSPITAL_COMMUNITY): Payer: Self-pay | Admitting: Emergency Medicine

## 2021-10-25 DIAGNOSIS — R6510 Systemic inflammatory response syndrome (SIRS) of non-infectious origin without acute organ dysfunction: Secondary | ICD-10-CM | POA: Diagnosis present

## 2021-10-25 DIAGNOSIS — Z993 Dependence on wheelchair: Secondary | ICD-10-CM

## 2021-10-25 DIAGNOSIS — Z8249 Family history of ischemic heart disease and other diseases of the circulatory system: Secondary | ICD-10-CM

## 2021-10-25 DIAGNOSIS — Z28310 Unvaccinated for covid-19: Secondary | ICD-10-CM

## 2021-10-25 DIAGNOSIS — K219 Gastro-esophageal reflux disease without esophagitis: Secondary | ICD-10-CM | POA: Diagnosis present

## 2021-10-25 DIAGNOSIS — E872 Acidosis, unspecified: Secondary | ICD-10-CM | POA: Diagnosis present

## 2021-10-25 DIAGNOSIS — Z8542 Personal history of malignant neoplasm of other parts of uterus: Secondary | ICD-10-CM

## 2021-10-25 DIAGNOSIS — I959 Hypotension, unspecified: Secondary | ICD-10-CM | POA: Diagnosis present

## 2021-10-25 DIAGNOSIS — Z7951 Long term (current) use of inhaled steroids: Secondary | ICD-10-CM

## 2021-10-25 DIAGNOSIS — Z9221 Personal history of antineoplastic chemotherapy: Secondary | ICD-10-CM

## 2021-10-25 DIAGNOSIS — T451X5A Adverse effect of antineoplastic and immunosuppressive drugs, initial encounter: Secondary | ICD-10-CM | POA: Diagnosis present

## 2021-10-25 DIAGNOSIS — I8001 Phlebitis and thrombophlebitis of superficial vessels of right lower extremity: Secondary | ICD-10-CM | POA: Diagnosis present

## 2021-10-25 DIAGNOSIS — Z885 Allergy status to narcotic agent status: Secondary | ICD-10-CM | POA: Diagnosis not present

## 2021-10-25 DIAGNOSIS — Z91041 Radiographic dye allergy status: Secondary | ICD-10-CM

## 2021-10-25 DIAGNOSIS — Z87891 Personal history of nicotine dependence: Secondary | ICD-10-CM | POA: Diagnosis not present

## 2021-10-25 DIAGNOSIS — Z9851 Tubal ligation status: Secondary | ICD-10-CM

## 2021-10-25 DIAGNOSIS — R1900 Intra-abdominal and pelvic swelling, mass and lump, unspecified site: Secondary | ICD-10-CM | POA: Diagnosis present

## 2021-10-25 DIAGNOSIS — G620 Drug-induced polyneuropathy: Secondary | ICD-10-CM | POA: Diagnosis present

## 2021-10-25 DIAGNOSIS — J449 Chronic obstructive pulmonary disease, unspecified: Secondary | ICD-10-CM | POA: Diagnosis present

## 2021-10-25 DIAGNOSIS — D509 Iron deficiency anemia, unspecified: Secondary | ICD-10-CM | POA: Diagnosis present

## 2021-10-25 DIAGNOSIS — G629 Polyneuropathy, unspecified: Secondary | ICD-10-CM

## 2021-10-25 DIAGNOSIS — Z79899 Other long term (current) drug therapy: Secondary | ICD-10-CM

## 2021-10-25 DIAGNOSIS — Z8719 Personal history of other diseases of the digestive system: Secondary | ICD-10-CM | POA: Diagnosis not present

## 2021-10-25 DIAGNOSIS — R109 Unspecified abdominal pain: Secondary | ICD-10-CM | POA: Diagnosis present

## 2021-10-25 DIAGNOSIS — Z20822 Contact with and (suspected) exposure to covid-19: Secondary | ICD-10-CM | POA: Diagnosis present

## 2021-10-25 DIAGNOSIS — E860 Dehydration: Secondary | ICD-10-CM

## 2021-10-25 DIAGNOSIS — I700 Atherosclerosis of aorta: Secondary | ICD-10-CM | POA: Diagnosis present

## 2021-10-25 DIAGNOSIS — Z9049 Acquired absence of other specified parts of digestive tract: Secondary | ICD-10-CM

## 2021-10-25 DIAGNOSIS — D649 Anemia, unspecified: Secondary | ICD-10-CM

## 2021-10-25 LAB — COMPREHENSIVE METABOLIC PANEL
ALT: 8 U/L (ref 0–44)
AST: 21 U/L (ref 15–41)
Albumin: 3 g/dL — ABNORMAL LOW (ref 3.5–5.0)
Alkaline Phosphatase: 51 U/L (ref 38–126)
Anion gap: 11 (ref 5–15)
BUN: 10 mg/dL (ref 8–23)
CO2: 22 mmol/L (ref 22–32)
Calcium: 8.6 mg/dL — ABNORMAL LOW (ref 8.9–10.3)
Chloride: 101 mmol/L (ref 98–111)
Creatinine, Ser: 0.74 mg/dL (ref 0.44–1.00)
GFR, Estimated: >60 mL/min (ref >60)
Glucose, Bld: 120 mg/dL — ABNORMAL HIGH (ref 70–99)
Potassium: 3.9 mmol/L (ref 3.5–5.1)
Sodium: 134 mmol/L — ABNORMAL LOW (ref 135–145)
Total Bilirubin: 0.7 mg/dL (ref 0.3–1.2)
Total Protein: 7.3 g/dL (ref 6.5–8.1)

## 2021-10-25 LAB — CBC WITH DIFFERENTIAL/PLATELET
Abs Immature Granulocytes: 0.07 10*3/uL (ref 0.00–0.07)
Basophils Absolute: 0 10*3/uL (ref 0.0–0.1)
Basophils Relative: 0 %
Eosinophils Absolute: 0 10*3/uL (ref 0.0–0.5)
Eosinophils Relative: 0 %
HCT: 23.6 % — ABNORMAL LOW (ref 36.0–46.0)
Hemoglobin: 7.3 g/dL — ABNORMAL LOW (ref 12.0–15.0)
Immature Granulocytes: 1 %
Lymphocytes Relative: 13 %
Lymphs Abs: 1.5 10*3/uL (ref 0.7–4.0)
MCH: 28.1 pg (ref 26.0–34.0)
MCHC: 30.9 g/dL (ref 30.0–36.0)
MCV: 90.8 fL (ref 80.0–100.0)
Monocytes Absolute: 0.9 10*3/uL (ref 0.1–1.0)
Monocytes Relative: 8 %
Neutro Abs: 8.7 10*3/uL — ABNORMAL HIGH (ref 1.7–7.7)
Neutrophils Relative %: 78 %
Platelets: 318 10*3/uL (ref 150–400)
RBC: 2.6 MIL/uL — ABNORMAL LOW (ref 3.87–5.11)
RDW: 16.7 % — ABNORMAL HIGH (ref 11.5–15.5)
WBC: 11.2 10*3/uL — ABNORMAL HIGH (ref 4.0–10.5)
nRBC: 0 % (ref 0.0–0.2)

## 2021-10-25 LAB — URINALYSIS, ROUTINE W REFLEX MICROSCOPIC
Bacteria, UA: NONE SEEN
Bilirubin Urine: NEGATIVE
Glucose, UA: NEGATIVE mg/dL
Hgb urine dipstick: NEGATIVE
Ketones, ur: NEGATIVE mg/dL
Nitrite: NEGATIVE
Protein, ur: NEGATIVE mg/dL
Specific Gravity, Urine: >1.046 — ABNORMAL HIGH (ref 1.005–1.030)
pH: 6 (ref 5.0–8.0)

## 2021-10-25 LAB — LACTIC ACID, PLASMA
Lactic Acid, Venous: 4.4 mmol/L (ref 0.5–1.9)
Lactic Acid, Venous: 3.1 mmol/L (ref 0.5–1.9)
Lactic Acid, Venous: 2.2 mmol/L (ref 0.5–1.9)

## 2021-10-25 LAB — TROPONIN I (HIGH SENSITIVITY)
Troponin I (High Sensitivity): 2 ng/L (ref <18)
Troponin I (High Sensitivity): <2 ng/L (ref <18)

## 2021-10-25 LAB — MRSA NEXT GEN BY PCR, NASAL: MRSA by PCR Next Gen: NOT DETECTED

## 2021-10-25 LAB — PROCALCITONIN: Procalcitonin: 0.19 ng/mL

## 2021-10-25 MED ORDER — ONDANSETRON HCL 4 MG/2ML IJ SOLN
4.0000 mg | Freq: Once | INTRAMUSCULAR | Status: AC
  Administered 2021-10-25: 4 mg via INTRAVENOUS
  Filled 2021-10-25: qty 2

## 2021-10-25 MED ORDER — ALBUTEROL SULFATE (2.5 MG/3ML) 0.083% IN NEBU
2.5000 mg | INHALATION_SOLUTION | Freq: Four times a day (QID) | RESPIRATORY_TRACT | Status: DC | PRN
Start: 2021-10-25 — End: 2021-10-27

## 2021-10-25 MED ORDER — ONDANSETRON HCL 4 MG PO TABS: 4.0000 mg | ORAL_TABLET | Freq: Four times a day (QID) | ORAL | Status: DC | PRN

## 2021-10-25 MED ORDER — ACETAMINOPHEN 650 MG RE SUPP: 650.0000 mg | Freq: Four times a day (QID) | RECTAL | Status: DC | PRN

## 2021-10-25 MED ORDER — MOMETASONE FURO-FORMOTEROL FUM 100-5 MCG/ACT IN AERO
2.0000 | INHALATION_SPRAY | Freq: Two times a day (BID) | RESPIRATORY_TRACT | Status: DC
  Administered 2021-10-25 – 2021-10-27 (×4): 2 via RESPIRATORY_TRACT
  Filled 2021-10-25: qty 8.8

## 2021-10-25 MED ORDER — SODIUM CHLORIDE 0.9 % IV BOLUS
1000.0000 mL | Freq: Once | INTRAVENOUS | Status: AC
  Administered 2021-10-25: 1000 mL via INTRAVENOUS

## 2021-10-25 MED ORDER — VITAMIN B-12 100 MCG PO TABS
500.0000 ug | ORAL_TABLET | Freq: Every day | ORAL | Status: DC
  Administered 2021-10-25 – 2021-10-27 (×3): 500 ug via ORAL
  Filled 2021-10-25 (×3): qty 5

## 2021-10-25 MED ORDER — PANTOPRAZOLE SODIUM 40 MG PO TBEC
40.0000 mg | DELAYED_RELEASE_TABLET | Freq: Every day | ORAL | Status: DC
  Administered 2021-10-25 – 2021-10-27 (×3): 40 mg via ORAL
  Filled 2021-10-25 (×3): qty 1

## 2021-10-25 MED ORDER — IOHEXOL 300 MG/ML  SOLN: 100.0000 mL | Freq: Once | INTRAMUSCULAR | Status: DC | PRN

## 2021-10-25 MED ORDER — CHLORHEXIDINE GLUCONATE CLOTH 2 % EX PADS
6.0000 | MEDICATED_PAD | Freq: Every day | CUTANEOUS | Status: DC
Start: 2021-10-25 — End: 2021-10-27
  Administered 2021-10-25 – 2021-10-26 (×2): 6 via TOPICAL

## 2021-10-25 MED ORDER — ALBUTEROL SULFATE HFA 108 (90 BASE) MCG/ACT IN AERS: 2.0000 | INHALATION_SPRAY | Freq: Four times a day (QID) | RESPIRATORY_TRACT | Status: DC | PRN

## 2021-10-25 MED ORDER — ONDANSETRON HCL 4 MG/2ML IJ SOLN
4.0000 mg | Freq: Four times a day (QID) | INTRAMUSCULAR | Status: DC | PRN
  Administered 2021-10-26: 4 mg via INTRAVENOUS
  Filled 2021-10-25: qty 2

## 2021-10-25 MED ORDER — FENTANYL CITRATE PF 50 MCG/ML IJ SOSY
50.0000 ug | PREFILLED_SYRINGE | Freq: Once | INTRAMUSCULAR | Status: AC
  Administered 2021-10-25: 50 ug via INTRAVENOUS
  Filled 2021-10-25: qty 1

## 2021-10-25 MED ORDER — ACETAMINOPHEN 325 MG PO TABS
650.0000 mg | ORAL_TABLET | Freq: Four times a day (QID) | ORAL | Status: DC | PRN
  Administered 2021-10-25 – 2021-10-26 (×3): 650 mg via ORAL
  Filled 2021-10-25 (×3): qty 2

## 2021-10-25 MED ORDER — HEPARIN SODIUM (PORCINE) 5000 UNIT/ML IJ SOLN
5000.0000 [IU] | Freq: Three times a day (TID) | INTRAMUSCULAR | Status: DC
  Administered 2021-10-25 – 2021-10-26 (×2): 5000 [IU] via SUBCUTANEOUS
  Filled 2021-10-25 (×2): qty 1

## 2021-10-25 MED ORDER — MORPHINE SULFATE (PF) 2 MG/ML IV SOLN
2.0000 mg | INTRAVENOUS | Status: DC | PRN
  Administered 2021-10-25: 2 mg via INTRAVENOUS
  Filled 2021-10-25: qty 1

## 2021-10-25 MED ORDER — SODIUM CHLORIDE 0.9 % IV SOLN: INTRAVENOUS | Status: DC

## 2021-10-25 MED ORDER — GABAPENTIN 300 MG PO CAPS
300.0000 mg | ORAL_CAPSULE | Freq: Three times a day (TID) | ORAL | Status: DC
  Administered 2021-10-25 – 2021-10-27 (×5): 300 mg via ORAL
  Filled 2021-10-25 (×5): qty 1

## 2021-10-25 MED ORDER — OXYCODONE HCL 5 MG PO TABS
5.0000 mg | ORAL_TABLET | ORAL | Status: DC | PRN
  Administered 2021-10-26 – 2021-10-27 (×2): 5 mg via ORAL
  Filled 2021-10-25 (×2): qty 1

## 2021-10-25 MED ORDER — IOHEXOL 350 MG/ML SOLN
100.0000 mL | Freq: Once | INTRAVENOUS | Status: AC | PRN
  Administered 2021-10-25: 85 mL via INTRAVENOUS

## 2021-10-25 MED ORDER — CALCIUM CARBONATE ANTACID 500 MG PO CHEW: 1.0000 | CHEWABLE_TABLET | ORAL | Status: DC | PRN

## 2021-10-25 NOTE — ED Provider Notes (Signed)
[facility] Provider Note   CSN: [id number] Arrival date & time: 10/25/21  1248     History  Chief Complaint  Patient presents with   Abdominal Pain    Michele Romero is a 61 y.o. female.  Michele Romero is a 61 y.o. female with a history of endometrial cancer s/p hysterectomy, COPD, chemotherapy-induced neuropathy, who presents to the emergency department for evaluation of abdominal pain.  Patient reports pain starts in the left side of her abdomen and radiates up into her chest into her back.  Patient reports that she has had some more mild pain over the past few days but today it is worse.  She also reports feeling generally weak.  No lightheadedness or syncope.  She does report that from her neuropathy she has some chronic weakness on the left side that she thinks might be a bit worse than usual.  She has been nauseated but has not vomited.  No shortness of breath.  No syncope.  No vaginal bleeding or blood in her stool, no melena noted.  No known fevers.  Followed by Dr. Delton Coombes with oncology for surveillance after endometrial cancer which was diagnosed about a year ago when she completed surgery and adjuvant chemotherapy.  The history is provided by the patient, a relative and medical records.  Abdominal Pain Associated symptoms: chest pain, fatigue and nausea   Associated symptoms: no chills, no constipation, no cough, no diarrhea, no dysuria, no fever, no hematuria, no shortness of breath, no vaginal bleeding, no vaginal discharge and no vomiting        Home Medications Prior to Admission medications   Medication Sig Start Date End Date Taking? Authorizing Provider  acetaminophen (TYLENOL) 500 MG tablet Take 1,000 mg by mouth every 6 (six) hours as needed for moderate pain.   Yes [provider]  albuterol (VENTOLIN HFA) 108 (90 Base) MCG/ACT inhaler Inhale 2 puffs into the lungs every 6 (six) hours as needed for wheezing or shortness of  breath. 10/09/20  Yes [name], NP  budesonide-formoterol (SYMBICORT) 80-4.5 MCG/ACT inhaler Inhale 2 puffs into the lungs 2 (two) times daily. 10/09/20  Yes [name], NP  gabapentin (NEURONTIN) 300 MG capsule Take 1 capsule (300 mg total) by mouth 3 (three) times daily. Take additional capsule at bed time for total dose '600mg'$  10/16/21  Yes Derek Jack, MD  loperamide (IMODIUM) 2 MG capsule Take 1 capsule (2 mg total) by mouth as needed for diarrhea or loose stools (Take 2 Capsules after the 1st loose stool and then 1 capsule adter each loose stool. Do not exceed 8 capsules in a 24-hr period. If it is bedtime and you are having loose stools, take 2 capsules at bestime and then take 2 capsules every 4 hours until morning.). 10/09/21  Yes Derek Jack, MD  magnesium oxide (MAG-OX) 400 (240 Mg) MG tablet Take 1 tablet (400 mg total) by mouth in the morning, at noon, and at bedtime. 10/16/21  Yes Derek Jack, MD  Multiple Vitamin (MULTIVITAMIN WITH MINERALS) TABS tablet Take 1 tablet by mouth daily.   Yes [provider]  potassium chloride SA (KLOR-CON M) 20 MEQ tablet Take 1 tablet (20 mEq total) by mouth daily. 10/16/21  Yes Derek Jack, MD  vitamin B-12 (CYANOCOBALAMIN) 500 MCG tablet Take 500 mcg by mouth daily.   Yes [provider]      Allergies    Ivp dye [iodinated contrast media] and Codeine  Review of Systems   Review of Systems  Constitutional:  Positive for fatigue. Negative for chills and fever.  HENT: Negative.    Respiratory:  Negative for cough and shortness of breath.   Cardiovascular:  Positive for chest pain.  Gastrointestinal:  Positive for abdominal pain and nausea. Negative for constipation, diarrhea and vomiting.  Genitourinary:  Negative for dysuria, frequency, hematuria, vaginal bleeding and vaginal discharge.  Neurological:  Positive for weakness (Generalized). Negative for syncope.    Physical Exam Updated  Vital Signs BP 99/70 (BP Location: Left Arm)   Pulse (!) 104   Temp 98.7 F (37.1 C) (Oral)   Resp 20   LMP 04/14/2010   SpO2 98%  Physical Exam Constitutional:      General: She is in acute distress.     Appearance: She is well-developed. She is ill-appearing.  HENT:     Head: Normocephalic and atraumatic.     Mouth/Throat:     Mouth: Mucous membranes are dry.     Pharynx: Oropharynx is clear.  Eyes:     General:        Right eye: No discharge.        Left eye: No discharge.  Cardiovascular:     Rate and Rhythm: Regular rhythm. Tachycardia present.     Pulses: Normal pulses.     Heart sounds: Normal heart sounds.  Pulmonary:     Effort: Pulmonary effort is normal. No respiratory distress.     Breath sounds: Normal breath sounds. No stridor. No wheezing.  Abdominal:     General: Bowel sounds are normal. There is distension.     Palpations: Abdomen is soft. There is mass.     Tenderness: There is abdominal tenderness.     Comments: Abdomen is somewhat distended with palpable firmness in the lower abdomen and diffuse tenderness  Musculoskeletal:     Cervical back: Neck supple.     Right lower leg: No edema.     Left lower leg: No edema.  Skin:    General: Skin is warm and dry.  Neurological:     General: No focal deficit present.     Mental Status: She is alert and oriented to person, place, and time.     Comments: Speech si clear, pt able to follow commands CN grossly intact 4.5/5 weakness in left upper and lower extremities, 5/5 on right, pt feels slightly weaker than normal Normal sensation in extremities  Psychiatric:        Mood and Affect: Mood normal.        Behavior: Behavior normal.     ED Results / Procedures / Treatments   Labs (all labs ordered are listed, but only abnormal results are displayed) Labs Reviewed  COMPREHENSIVE METABOLIC PANEL - Abnormal; Notable for the following components:      Result Value   Sodium 134 (*)    Glucose, Bld 120 (*)     Calcium 8.6 (*)    Albumin 3.0 (*)    All other components within normal limits  CBC WITH DIFFERENTIAL/PLATELET - Abnormal; Notable for the following components:   WBC 11.2 (*)    RBC 2.60 (*)    Hemoglobin 7.3 (*)    HCT 23.6 (*)    RDW 16.7 (*)    Neutro Abs 8.7 (*)    All other components within normal limits  URINALYSIS, ROUTINE W REFLEX MICROSCOPIC - Abnormal; Notable for the following components:   Specific Gravity, Urine >1.046 (*)  Leukocytes,Ua TRACE (*)    All other components within normal limits  LACTIC ACID, PLASMA - Abnormal; Notable for the following components:   Lactic Acid, Venous 4.4 (*)    All other components within normal limits  LACTIC ACID, PLASMA - Abnormal; Notable for the following components:   Lactic Acid, Venous 3.1 (*)    All other components within normal limits  LACTIC ACID, PLASMA - Abnormal; Notable for the following components:   Lactic Acid, Venous 2.2 (*)    All other components within normal limits  CULTURE, BLOOD (ROUTINE X 2)  CULTURE, BLOOD (ROUTINE X 2)  PROCALCITONIN  POC OCCULT BLOOD, ED  TROPONIN I (HIGH SENSITIVITY)  TROPONIN I (HIGH SENSITIVITY)    EKG None  Radiology CT Angio Chest/Abd/Pel for Dissection W and/or W/WO  Result Date: 10/25/2021 CLINICAL DATA:  Chest or back pain, aortic dissection suspected. History of endometrial cancer. * Tracking Code: BO * EXAM: CT ANGIOGRAPHY CHEST, ABDOMEN AND PELVIS TECHNIQUE: Non-contrast CT of the chest was initially obtained. Multidetector CT imaging through the chest, abdomen and pelvis was performed using the standard protocol during bolus administration of intravenous contrast. Multiplanar reconstructed images and MIPs were obtained and reviewed to evaluate the vascular anatomy. RADIATION DOSE REDUCTION: This exam was performed according to the departmental dose-optimization program which includes automated exposure control, adjustment of the mA and/or kV according to patient size  and/or use of iterative reconstruction technique. CONTRAST:  50m OMNIPAQUE IOHEXOL 350 MG/ML SOLN COMPARISON:  Multiple prior including CTs October 09, 2021 September 17, 2021 and July 02, 2021. FINDINGS: CTA CHEST FINDINGS Cardiovascular: Noncontrast sequence demonstrates aortic atherosclerosis without intramural hematoma. Accessed right chest Port-A-Cath with tip near the superior cavoatrial junction. Preferential opacification of the thoracic aorta. No evidence of thoracic aortic aneurysm or dissection. No central pulmonary embolus on this nondedicated study. Normal heart size. No significant pericardial effusion/thickening. Mediastinum/Nodes: Pathologically enlarged mediastinal, hilar or axillary lymph nodes. Small hiatal hernia. Lungs/Pleura: No suspicious pulmonary nodules or masses. No focal airspace consolidation. Pleural effusion. No pneumothorax. Musculoskeletal: Thoracic spondylosis. No suspicious chest wall mass. Review of the MIP images confirms the above findings. CTA ABDOMEN AND PELVIS FINDINGS VASCULAR Aorta: Normal caliber aorta without aneurysm, dissection, vasculitis or significant stenosis. Celiac: Patent without evidence of aneurysm, dissection, vasculitis or significant stenosis. SMA: Patent without evidence of aneurysm, dissection, vasculitis or significant stenosis. Renals: Both renal arteries are patent without evidence of aneurysm, dissection, vasculitis, fibromuscular dysplasia or significant stenosis. IMA: Patent without evidence of aneurysm, dissection, vasculitis or significant stenosis. Inflow: Patent without evidence of aneurysm, dissection, vasculitis or significant stenosis. Veins: No obvious venous abnormality within the limitations of this arterial phase study. Review of the MIP images confirms the above findings. NON-VASCULAR Hepatobiliary: No suspicious hepatic lesion. Gallbladder surgically absent. No biliary ductal dilation. Pancreas: No pancreatic ductal dilation or evidence of  acute inflammation. Spleen: No splenomegaly or focal splenic lesion. Adrenals/Urinary Tract: Bilateral adrenal glands appear normal. Prominence of the right renal collecting system and proximal ureter to the level of the large pelvic mass with delayed right renal enhancement. Left kidney is unremarkable. No suspicious renal mass. Urinary bladder is minimally distended limiting evaluation. Stomach/Bowel: No radiopaque enteric contrast material was administered. Small hiatal hernia otherwise the stomach is unremarkable for degree of distension. Effacement and multifocal narrowing of large and small bowel loops by the large pelvic mass. Prominent loops of small bowel in the right hemiabdomen with interposed effacement and multifocal narrowing of small-bowel loops Lymphatic: Enlarged iliac side  chain and inguinal lymph nodes. For reference a left inguinal lymph node measures 11 mm in short axis on image 185/6 previously 6 mm in short axis, and a left external iliac lymph node measures 9 mm in short axis on image 165/6 previously 7 mm in short axis. No abdominal adenopathy. Reproductive: Prior hysterectomy. There is a large heterogeneous mass extending from the vaginal cuff into the abdomen which appears to demonstrate nodular areas postcontrast enhancement and measures 17.3 x 14.7 x 24.1 cm on images 151/6 and 109/11. The adnexa are obscured by the large pelvic mass. Other: Trace right-sided abdominopelvic free fluid with possible nodular peritoneal thickening. Nonspecific subcutaneous edema. Musculoskeletal: Multilevel degenerative change of the spine. No aggressive lytic or blastic lesion of bone. Severe right hip degenerative change Review of the MIP images confirms the above findings. IMPRESSION: 1. 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 2. Increased size of prominent/mildly enlarged  iliac side chain and inguinal lymph nodes which are suspicious for disease involvement. 3. Prominent loops of small bowel in the right hemiabdomen with interposed loops of nondilated bowel, no upstream dilation of small bowel or discrete abrupt transition to decompressed bowel, favored to be reactive or reflect focal enteritis. However an early/partial small bowel obstruction related to the large pelvic mass can not be excluded, recommend close clinical interval follow-up. 4. Right-sided obstructive uropathy related to the large pelvic mass. Consider correlation with laboratory values to exclude superimposed infection. 5. Trace right-sided abdominopelvic free fluid possible nodular peritoneal thickening but poorly evaluated on this examination. These results were called by telephone at the time of interpretation on 10/25/2021 at 2:59 pm to provider Dr. Langston Masker, who verbally acknowledged these results. Electronically Signed   By: Dahlia Bailiff M.D.   On: 10/25/2021 15:30   CT Head Wo Contrast  Result Date: 10/25/2021 CLINICAL DATA:  Neuro deficit EXAM: CT HEAD WITHOUT CONTRAST TECHNIQUE: Contiguous axial images were obtained from the base of the skull through the vertex without intravenous contrast. RADIATION DOSE REDUCTION: This exam was performed according to the departmental dose-optimization program which includes automated exposure control, adjustment of the mA and/or kV according to patient size and/or use of iterative reconstruction technique. COMPARISON:  Head CT dated July 27, 2014 FINDINGS: Brain: No evidence of acute infarction, hemorrhage, hydrocephalus, extra-axial collection or mass lesion/mass effect. Vascular: No hyperdense vessel or unexpected calcification. Skull: Normal. Negative for fracture or focal lesion. Sinuses/Orbits: No acute finding. Other: None. IMPRESSION: No acute intracranial abnormality. Electronically Signed   By: Yetta Glassman M.D.   On: 10/25/2021 14:42     Procedures .Critical Care  Performed by: Jacqlyn Larsen, PA-C Authorized by: Jacqlyn Larsen, PA-C   Critical care provider statement:    Critical care time (minutes):  30   Critical care was necessary to treat or prevent imminent or life-threatening deterioration of the following conditions: Large intra-abdominal mass causing some hypotension and tachycardia.   Critical care was time spent personally by me on the following activities:  Development of treatment plan with patient or surrogate, discussions with consultants, evaluation of patient's response to treatment, examination of patient, ordering and review of laboratory studies, ordering and review of radiographic studies, ordering and performing treatments and interventions, pulse oximetry, re-evaluation of patient's condition and review of old charts   Care discussed with: admitting provider       Medications Ordered in ED Medications  ondansetron (ZOFRAN) injection 4 mg (4 mg Intravenous Given 10/25/21 1430)  fentaNYL (SUBLIMAZE) injection 50 mcg (50 mcg Intravenous Given 10/25/21 1449)  sodium chloride 0.9 % bolus 1,000 mL (1,000 mLs Intravenous New Bag/Given 10/25/21 1430)  iohexol (OMNIPAQUE) 350 MG/ML injection 100 mL (85 mLs Intravenous Contrast Given 10/25/21 1409)  sodium chloride 0.9 % bolus 1,000 mL (0 mLs Intravenous Stopped 10/25/21 1636)    ED Course/ Medical Decision Making/ A&P Clinical Course as of 10/25/21 1923  Fri Oct 25, 2021  1500 Pt presented to ED with weakness and abdominal pain.  Seen twice in June for chest pain/syncope with two negative CT PE scans last month.  Today she had a dissection study, and was found to have very large intraabdominal mass, concerning for aggressive malignancy.  Per my conversation with the radiologist this mass was not visible on her last abd CT in March, four months ago.  On exam she does have a firm abdominal mass.  She was also noted to be tachycardic here, with softer BP, but she  reports her BP are generally quite low.  Lactate was elevated 4.0 - improving with fluids.  No clear source or location for sepsis or infection - I suspect this is related to this aggressive malignancy.  Troponins <2 - doubt massive PE, wand no hypoxia here - I would not report a third PE scan in 2 months at this time.  She will be admitted to the hospital for specialist consultation and monitoring.  Pt and family made aware of findings and likely diagnosis.  Of note, hgb did drop from 1 month ago, but has been stable around 7.3 for the past 2 weeks on recheck.  No clear evidence of active bleeding at this time for emergent transfusion - again, this drop may be related to her malignancy. [MT]  9735 CT also showing bowel compression without clear obstruction, and right renal obstruction from mass.  CTH without malignancy or emergent findings. [MT]    Clinical Course User Index [MT] Trifan, Carola Rhine, MD                           Medical Decision Making Amount and/or Complexity of Data Reviewed Labs: ordered. Radiology: ordered.  Risk Prescription drug management. Decision regarding hospitalization.   Michele Romero is a 61 y.o. female presents to the ED for concern of abdominal pain radiating into the chest and back, this involves an extensive number of treatment options, and is a complaint that carries with it a high risk of complications and morbidity.  The differential diagnosis includes aortic dissection, PE, abdominal mass, sepsis, perforation, obstruction, electrolyte derangement, dehydration  On arrival patient is tachycardic with soft blood pressures, does meet SIRS criteria but does not have obvious source of infection at this time.  Other potential reasons for patient's hypotension and tachycardia will continue with work-up  Additional history obtained:  Additional history obtained from relative at bedside External records from outside source obtained and reviewed including  oncology follow-up notes and prior hysterectomy   Lab Tests:  I Ordered, reviewed, and interpreted labs.  The pertinent results include:   Hemoglobin of 7.3, patient has had a ~two-point drop in hemoglobin over the past month but has not had any bleeding symptoms.  Mild leukocytosis of 11.4, normal renal function, mild hypocalcemia of 7.8 but no other significant electrolyte derangements, normal liver function.  Lactic acid is 4.4 but this could be in the setting  of anemia, no significant leukocytosis we will continue to hold off on antibiotics at this time but blood cultures and urine culture sent.  Urinalysis with trace leukocytes but no significant signs of infection.  Troponins negative.   Imaging Studies ordered:  I ordered imaging studies including CT dissection study, CT head given patient's reported slight worsening of left-sided weakness I independently visualized and interpreted imaging which showed CT head without intracranial bleeding or mass, CT dissection study fortunately without evidence of aortic dissection or aneurysm, no obvious PE, but there is a 24 x 17 x 14 cm mass suspicious for metastatic disease or cancer recurrence in the pelvis and lower abdomen.  Patient had CT scan in March without evidence of this mass.  There is some prominent loops of small bowel in this area but with no obvious obstruction, could be reactive or focal enteritis versus early partial small bowel obstruction.  Patient is having bowel movements.  Right-sided obstructive uropathy from the pelvic mass. I agree with the radiologist interpretation   Cardiac Monitoring:  The patient was maintained on a cardiac monitor.  I personally viewed and interpreted the cardiac monitored which showed an underlying rhythm of: Sinus tachycardia   Medicines ordered and prescription drug management:  I ordered medication including IV fluid bolus, Zofran and fentanyl for symptom control, no source of infection on CT so  we will continue to hold off on any antibiotics Reevaluation of the patient after these medicines showed that the patient  has had improvement in her pain, remains tachycardic, additional IV fluids given I have reviewed the patients home medicines and have made adjustments as needed   ED Course:  Patient presented with tachycardia, soft blood pressures and abdominal pain radiating into the back and chest.  Sent for dissection study which fortunately did not show a vascular emergency but did show a very large mass that appears has rapidly recurred as it was not present in recent CT imaging in March.  Suspect this is a recurrence or metastasis from patient's endometrial cancer. Patient with hemoglobin of 7.3 but Hemoccult negative, suspect blood loss is in the setting of rapid progression of mass.  Currently we will hold off on transfusion but may require it if hemoglobin continues to downtrend. We will discuss with oncology and surgical gynecologic oncology.  Patient will require hospital admission. I discussed results and signs of cancer recurrence with patient and family at bedside   Consultations Obtained:  I requested consultation with the oncologist,  and discussed lab and imaging findings as well as pertinent plan -Dr. Delton Coombes recommends speaking with gynecologic oncologist to see if patient is candidate for surgery at this time I requested consultation with the gynecologic oncologist and discussed pertinent labs and imaging findings with Dr. Berline Lopes.  Dr. Berline Lopes does not feel that the patient is a candidate for emergent surgical intervention, and does not feel that she needs transfer to [facility] long as that would likely result in her being transferred to [facility] due to coverage over the weekend.  She thinks that the patient will likely need chemotherapy and then if there is shrinking of the tumor she may eventually be a surgical candidate.  She will provide her phone number for the  inpatient team for continued care over the weekend.   Dispostion:  After consideration of the diagnostic results and the patients response to treatment feel that the patent would benefit from admission.          Final Clinical Impression(s) /  ED Diagnoses Final diagnoses:  Intraabdominal mass  Dehydration  Anemia, unspecified type    Rx / DC Orders ED Discharge Orders     None         Jacqlyn Larsen, [place] 11/14/21 [id number]    Wyvonnia Dusky, MD 11/15/21 1420

## 2021-10-25 NOTE — Assessment & Plan Note (Signed)
-   Hemoglobin 7.3 today -At the beginning of June hemoglobin 10, otherwise baseline seems to be between 7.5 and 8 -FOBT negative -Completed chemo in February -Anemia panel pending -Continue home B12 supplementation -Trend in the a.m.

## 2021-10-25 NOTE — H&P (Signed)
History and Physical    Patient: Michele Romero MWU:[id number] DOB: 1961/04/06 DOA: 10/25/2021 DOS: the patient was seen and examined on 10/25/2021 PCP: Pcp, No  Patient coming from: Home  Chief Complaint:  Chief Complaint  Patient presents with   Abdominal Pain   HPI: Michele Romero is a 61 y.o. 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  Patient reports that started 2 days ago.  The pain has been progressively worse but it was intermittent until last night when it became acutely worse and constant.  Pain is at the bottom of her stomach.  She reports the cramping pain.  She took Tylenol and it eased it up temporarily, but did not relieve it.  Patient denies fevers.  She denies dysuria.  She reports frequency and urgency, but does wear depends at baseline.  She had no hematuria, no vaginal bleeding.  Patient reports diarrhea 3 times a day.  She reports no hematochezia or melena.  She has had nausea and vomiting especially after meals.  It is all liquid and there is no hematemesis or coffee-ground emesis.  Her last normal meal was in the ER prior to admission.  Patient describes her chest pain as feeling like heartburn.  It is intermittent.  It is relieved by eating.  Nausea and vomiting make it worse.  Patient reports she ran out of her GERD medications about 2 weeks ago.  Patient reports that she has had exertional dyspnea for about 1 month.  No change in her dyspnea with these symptoms.  Patient is wheelchair-bound since having chemo.  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  Patient has no other complaints at this time.  She does report that her last chemo was in February.  Patient does not  smoke, does not drink, does use marijuana.  Her last use was 4 July.  She is not vaccinated for COVID, but reports that she was recently negative for COVID June 28.  Patient is full code Review of Systems: As mentioned in the history of present illness. All other systems reviewed and are negative. Past Medical History:  Diagnosis Date   Ambulates with cane 10/31/2020   Arthritis    RIGHT HIP   Asthma    Cigarette nicotine dependence without complication 72/53/6644   Complex tear of medial meniscus of right knee 01/20/2017   COPD (chronic obstructive pulmonary disease) ([place])    ENDOMETRIAL CANCER 10/31/2020   Endometrial cancer ([place]) 11/15/2020   GERD (gastroesophageal reflux disease)    History of radiation therapy    [facility] brachytherapy [place] 05/08/2021-06/03/2021  Dr Gery Pray   Migraines    Port-A-Cath in place 01/01/2021   Past Surgical History:  Procedure Laterality Date   APPENDECTOMY     YRS AGO PER PT ON 10-31-2020   BIOPSY  11/29/2019   Procedure: BIOPSY;  Surgeon: Harvel Quale, MD;  [facility]: [facility];  Service: Gastroenterology;;  ileocecal vlve   CHOLECYSTECTOMY     YRS AGO PER PT ON 10-31-2020   COLONOSCOPY WITH PROPOFOL N/A 11/29/2019   Procedure: COLONOSCOPY WITH PROPOFOL;  Surgeon: Harvel Quale, MD;  [facility];  Service: Gastroenterology;  Laterality: N/A;  930  HYSTEROSCOPY WITH D & C N/A 10/10/2020   Procedure: DILATATION AND CURETTAGE /HYSTEROSCOPY;  Surgeon: Florian Buff, MD;  Location: [facility];  Service: Gynecology;  Laterality: N/A;   IR IMAGING GUIDED PORT INSERTION  01/08/2021   KNEE ARTHROPLASTY  2018   left knee   POLYPECTOMY  11/29/2019   Procedure: POLYPECTOMY;  Surgeon: Montez Morita, Quillian Quince, MD;  [facility]: [facility];  Service: Gastroenterology;;  sigmoid colon    ROBOTIC ASSISTED TOTAL HYSTERECTOMY WITH BILATERAL SALPINGO OOPHERECTOMY N/A 11/15/2020   Procedure: XI ROBOTIC ASSISTED TOTAL HYSTERECTOMY  GREATER THAN TWO HUNDRED AND FIFTY GRAMS WITH BILATERAL SALPINGO OOPHORECTOMY, MINI LAPAROTOMY;  Surgeon: Everitt Amber, MD;  Location: [place];  Service: Gynecology;  Laterality: N/A;   SENTINEL NODE BIOPSY N/A 11/15/2020   Procedure: SENTINEL NODE BIOPSY;  Surgeon: Everitt Amber, MD;  Location: [facility];  Service: Gynecology;  Laterality: N/A;   TUBAL LIGATION  04/14/1985   Social History:  reports that she quit smoking about 21 months ago. Her smoking use included cigarettes. She has a 15.00 pack-year smoking history. She has never been exposed to tobacco smoke. She has never used smokeless tobacco. She reports current drug use. Frequency: 6.00 times per week. Drug: Marijuana. She reports that she does not drink alcohol.  Allergies  Allergen Reactions   Ivp Dye [Iodinated Contrast Media] Nausea And Vomiting    Patient had an episode of projectile vomiting after IV injection. Patient states it happens every time she has IV contrast.     Codeine Nausea And Vomiting and Rash    Family History  Problem Relation Age of Onset   Diabetes Mother    Hypertension Mother    Heart disease Mother        CHF   Stroke Mother    Heart disease Father    Colon cancer Brother    Breast cancer Neg Hx    Ovarian cancer Neg Hx    Endometrial cancer Neg Hx    Prostate cancer Neg Hx    Pancreatic cancer Neg Hx     Prior to Admission medications   Medication Sig Start Date End Date Taking? Authorizing Provider  acetaminophen (TYLENOL) 500 MG tablet Take 1,000 mg by mouth every 6 (six) hours as needed for moderate pain.   Yes [provider]  albuterol (VENTOLIN HFA) 108 (90 Base) MCG/ACT inhaler Inhale 2 puffs into the lungs every 6 (six) hours as needed for wheezing or shortness of breath. 10/09/20  Yes [name], NP  budesonide-formoterol (SYMBICORT) 80-4.5 MCG/ACT inhaler Inhale 2 puffs into the lungs 2 (two) times daily. 10/09/20  Yes [name], NP   gabapentin (NEURONTIN) 300 MG capsule Take 1 capsule (300 mg total) by mouth 3 (three) times daily. Take additional capsule at bed time for total dose '600mg'$  10/16/21  Yes Derek Jack, MD  loperamide (IMODIUM) 2 MG capsule Take 1 capsule (2 mg total) by mouth as needed for diarrhea or loose stools (Take 2 Capsules after the 1st loose stool and then 1 capsule adter each loose stool. Do not exceed 8 capsules in a 24-hr period. If it is bedtime and you are having loose stools, take 2 capsules at bestime and then take 2 capsules every 4 hours until morning.). 10/09/21  Yes Derek Jack, MD  magnesium oxide (MAG-OX) 400 (240 Mg) MG tablet Take 1 tablet (400 mg total) by mouth in the morning, at noon, and at bedtime. 10/16/21  Yes Derek Jack,  MD  Multiple Vitamin (MULTIVITAMIN WITH MINERALS) TABS tablet Take 1 tablet by mouth daily.   Yes [provider]  potassium chloride SA (KLOR-CON M) 20 MEQ tablet Take 1 tablet (20 mEq total) by mouth daily. 10/16/21  Yes Derek Jack, MD  vitamin B-12 (CYANOCOBALAMIN) 500 MCG tablet Take 500 mcg by mouth daily.   Yes [provider]    Physical Exam: Vitals:   10/25/21 1745 10/25/21 1800 10/25/21 1815 10/25/21 1830  BP:  95/68  90/65  Pulse: (!) 124 87 (!) 115 (!) 120  Resp: '15 15 18 20  '$ Temp:      TempSrc:      SpO2: 96% 99% 95% 95%   1.  General: Patient lying supine in bed,  no acute distress   2. Psychiatric: Alert and oriented x 3, mood and behavior normal for situation, pleasant and cooperative with exam   3. Neurologic: Speech and language are normal, face is symmetric, moves all 4 extremities voluntarily, bilateral upper extremity contractures, at baseline without acute deficits on limited exam   4. HEENMT:  Head is atraumatic, normocephalic, pupils reactive to light, neck is supple, trachea is midline, mucous membranes are moist   5. Respiratory : Lungs are clear to auscultation bilaterally  without wheezing, rhonchi, rales, no cyanosis, no increase in work of breathing or accessory muscle use   6. Cardiovascular : Heart rate normal, rhythm is regular, no murmurs, rubs or gallops, peripheral edema present, peripheral pulses palpated   7. Gastrointestinal:  Abdomen is taut, minimally distended, mildly tender to palpation bowel sounds active   8. Skin:  Skin is warm, dry and intact without rashes, acute lesions, or ulcers on limited exam   9.Musculoskeletal:  No acute deformities or trauma, no asymmetry in tone, peripheral edema present, peripheral pulses palpated, no tenderness to palpation in the extremities  Data Reviewed: In the ED Temp 98.7, heart rate 104-136, respiratory rate 19-26, blood pressure 86/58-107/92, satting 99% Minimal leukocytosis at 11.2, hemoglobin 7.3 Chemistry is unremarkable Gynecology oncology recommends admission for pain control and stabilization to [facility] and follow-up outpatient Trope 2, 2 Lactic acid is has 4.4, down to 2.2 UA borderline with trace leuks and 11-20 white blood cells CT head shows no acute intracranial abnormality CTA chest abdomen pelvis shows large heterogeneous mass extending from the vaginal cuff into the mid abdomen measuring 24 x 17 x 14 cm highly suspicious for metastatic disease.  Increased size and mildly enlarged iliac side chain and inguinal lymph nodes which are suspicious for disease involvement.  Prominent loops of small bowel in the right hemiabdomen are interposed loops of nondilated bowel no upstream dilation or area of transition, favored to be reactive but possibly developing small bowel obstruction.  Right-sided obstructive uropathy related to large pelvic mass.  Trace right abdominal pelvic fluid possible nodular peritoneal thickening but poorly evaluated on this exam.  Assessment and Plan: * SIRS (systemic inflammatory response syndrome) (HCC) - Tachycardic at 136, tachypneic at 26, hypotensive at 86/58,  lactic acidosis as high as 4.4 down to 2.2 -Continue trending lactic acid -Fluids for blood pressure and tachycardia -Procalcitonin is 0.19 -Please SIRS criteria can be explained by large pelvic mass -UA is borderline, urine culture pending -Patient has diarrhea but that can be explained by pelvic mass and partial small bowel obstruction as a result of pelvic mass -No other infectious symptoms, afebrile -Holding off on antibiotics at this time, will continue to monitor  Neuropathy - Continue gabapentin -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  Pelvic mass in female - CT abdomen pelvis shows a 24 x 17 x 14 cm mass extending from the vaginal cuff. -History of endometrial cancer with total hysterectomy and completion of chemotherapy -Dr. Delton Coombes consulted and recommended that gynecology oncology when -Gynecology recommended against transfer to [facility], but gave personal cell phone number to be reached if there are questions over the weekend.  This number will be left in the [facility] physician communication -For now the plan is pain control, management of medical complications, follow-up outpatient with oncology to determine next step -Continue to monitor  Chronic obstructive pulmonary disease ([place]) - Continue as needed albuterol and scheduled Symbicort      Advance Care Planning:   Code Status: Prior full  Consults: Oncology  Family Communication: Mother at bedside  Severity of Illness: The appropriate patient status for this patient is INPATIENT. Inpatient  status is judged to be reasonable and necessary in order to provide the required intensity of service to ensure the patient's safety. The patient's presenting symptoms, physical exam findings, and initial radiographic and laboratory data in the context of their chronic comorbidities is felt to place them at high risk for further clinical deterioration. Furthermore, it is not anticipated that the patient will be medically stable for discharge from the hospital within 2 midnights of admission.   * I certify that at the point of admission it is my clinical judgment that the patient will require inpatient hospital care spanning beyond 2 midnights from the point of admission due to high intensity of service, high risk for further deterioration and high frequency of surveillance required.*  Author: Rolla Plate, DO 10/25/2021 8:33 PM  For on call review [email or web address].

## 2021-10-25 NOTE — ED Notes (Signed)
Ct contacted and they are coming to get her for the CT.

## 2021-10-25 NOTE — Assessment & Plan Note (Signed)
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

## 2021-10-25 NOTE — ED Notes (Signed)
Patient transported to CT 

## 2021-10-25 NOTE — Assessment & Plan Note (Signed)
-   Continue as needed albuterol and scheduled Symbicort

## 2021-10-25 NOTE — ED Triage Notes (Signed)
Pt BIB [facility]. Pt reports LLQ abdominal pain with nausea.

## 2021-10-25 NOTE — Assessment & Plan Note (Signed)
-   CT abdomen pelvis shows a 24 x 17 x 14 cm mass extending from the vaginal cuff. -History of endometrial cancer with total hysterectomy and completion of chemotherapy -Dr. Delton Coombes consulted and recommended that gynecology oncology when -Gynecology recommended against transfer to [facility], but gave personal cell phone number to be reached if there are questions over the weekend.  This number will be left in the [facility] physician communication -For now the plan is pain control, management of medical complications, follow-up outpatient with oncology to determine next step -Continue to monitor

## 2021-10-25 NOTE — Assessment & Plan Note (Signed)
-   Tachycardic at 136, tachypneic at 26, hypotensive at 86/58, lactic acidosis as high as 4.4 down to 2.2 -Continue trending lactic acid -Fluids for blood pressure and tachycardia -Procalcitonin is 0.19 -Please SIRS criteria can be explained by large pelvic mass -UA is borderline, urine culture pending -Patient has diarrhea but that can be explained by pelvic mass and partial small bowel obstruction as a result of pelvic mass -No other infectious symptoms, afebrile -Holding off on antibiotics at this time, will continue to monitor

## 2021-10-25 NOTE — Assessment & Plan Note (Signed)
-   Patient has been out of Protonix and Tums for 2 weeks -Continue Tums and Protonix

## 2021-10-26 ENCOUNTER — Inpatient Hospital Stay (HOSPITAL_COMMUNITY): Payer: 59

## 2021-10-26 ENCOUNTER — Encounter (HOSPITAL_COMMUNITY): Payer: Self-pay | Admitting: Family Medicine

## 2021-10-26 DIAGNOSIS — R6510 Systemic inflammatory response syndrome (SIRS) of non-infectious origin without acute organ dysfunction: Secondary | ICD-10-CM | POA: Diagnosis not present

## 2021-10-26 LAB — COMPREHENSIVE METABOLIC PANEL
ALT: 8 U/L (ref 0–44)
AST: 17 U/L (ref 15–41)
Albumin: 2.4 g/dL — ABNORMAL LOW (ref 3.5–5.0)
Alkaline Phosphatase: 43 U/L (ref 38–126)
Anion gap: 6 (ref 5–15)
BUN: 8 mg/dL (ref 8–23)
CO2: 23 mmol/L (ref 22–32)
Calcium: 7.8 mg/dL — ABNORMAL LOW (ref 8.9–10.3)
Chloride: 105 mmol/L (ref 98–111)
Creatinine, Ser: 0.64 mg/dL (ref 0.44–1.00)
GFR, Estimated: >60 mL/min (ref >60)
Glucose, Bld: 119 mg/dL — ABNORMAL HIGH (ref 70–99)
Potassium: 3.6 mmol/L (ref 3.5–5.1)
Sodium: 134 mmol/L — ABNORMAL LOW (ref 135–145)
Total Bilirubin: 0.5 mg/dL (ref 0.3–1.2)
Total Protein: 5.8 g/dL — ABNORMAL LOW (ref 6.5–8.1)

## 2021-10-26 LAB — HEMOGLOBIN AND HEMATOCRIT, BLOOD
HCT: 19.1 % — ABNORMAL LOW (ref 36.0–46.0)
HCT: 27.1 % — ABNORMAL LOW (ref 36.0–46.0)
Hemoglobin: 6 g/dL — CL (ref 12.0–15.0)
Hemoglobin: 9 g/dL — ABNORMAL LOW (ref 12.0–15.0)

## 2021-10-26 LAB — CBC WITH DIFFERENTIAL/PLATELET
Abs Immature Granulocytes: 0.07 10*3/uL (ref 0.00–0.07)
Basophils Absolute: 0 10*3/uL (ref 0.0–0.1)
Basophils Relative: 0 %
Eosinophils Absolute: 0 10*3/uL (ref 0.0–0.5)
Eosinophils Relative: 0 %
HCT: 18.4 % — ABNORMAL LOW (ref 36.0–46.0)
Hemoglobin: 5.8 g/dL — CL (ref 12.0–15.0)
Immature Granulocytes: 1 %
Lymphocytes Relative: 18 %
Lymphs Abs: 2 10*3/uL (ref 0.7–4.0)
MCH: 28.6 pg (ref 26.0–34.0)
MCHC: 31.5 g/dL (ref 30.0–36.0)
MCV: 90.6 fL (ref 80.0–100.0)
Monocytes Absolute: 1.1 10*3/uL — ABNORMAL HIGH (ref 0.1–1.0)
Monocytes Relative: 9 %
Neutro Abs: 8.2 10*3/uL — ABNORMAL HIGH (ref 1.7–7.7)
Neutrophils Relative %: 72 %
Platelets: 256 10*3/uL (ref 150–400)
RBC: 2.03 MIL/uL — ABNORMAL LOW (ref 3.87–5.11)
RDW: 17 % — ABNORMAL HIGH (ref 11.5–15.5)
WBC: 11.4 10*3/uL — ABNORMAL HIGH (ref 4.0–10.5)
nRBC: 0 % (ref 0.0–0.2)

## 2021-10-26 LAB — FOLATE: Folate: 7.7 ng/mL (ref >5.9)

## 2021-10-26 LAB — VITAMIN B12: Vitamin B-12: 344 pg/mL (ref 180–914)

## 2021-10-26 LAB — LACTIC ACID, PLASMA
Lactic Acid, Venous: 1.6 mmol/L (ref 0.5–1.9)
Lactic Acid, Venous: 1.9 mmol/L (ref 0.5–1.9)
Lactic Acid, Venous: 3.1 mmol/L (ref 0.5–1.9)

## 2021-10-26 LAB — IRON AND TIBC
Iron: 13 ug/dL — ABNORMAL LOW (ref 28–170)
Saturation Ratios: 7 % — ABNORMAL LOW (ref 10.4–31.8)
TIBC: 176 ug/dL — ABNORMAL LOW (ref 250–450)
UIBC: 163 ug/dL

## 2021-10-26 LAB — MAGNESIUM: Magnesium: 1.5 mg/dL — ABNORMAL LOW (ref 1.7–2.4)

## 2021-10-26 LAB — RETICULOCYTES
Immature Retic Fract: 35.4 % — ABNORMAL HIGH (ref 2.3–15.9)
RBC.: 2.02 MIL/uL — ABNORMAL LOW (ref 3.87–5.11)
Retic Count, Absolute: 57.6 10*3/uL (ref 19.0–186.0)
Retic Ct Pct: 2.9 % (ref 0.4–3.1)

## 2021-10-26 LAB — ABO/RH: ABO/RH(D): O POS

## 2021-10-26 LAB — PREPARE RBC (CROSSMATCH)

## 2021-10-26 LAB — FERRITIN: Ferritin: 607 ng/mL — ABNORMAL HIGH (ref 11–307)

## 2021-10-26 LAB — HIV ANTIBODY (ROUTINE TESTING W REFLEX): HIV Screen 4th Generation wRfx: NONREACTIVE

## 2021-10-26 MED ORDER — MAGNESIUM SULFATE 2 GM/50ML IV SOLN
2.0000 g | Freq: Once | INTRAVENOUS | Status: AC
  Administered 2021-10-26: 2 g via INTRAVENOUS
  Filled 2021-10-26: qty 50

## 2021-10-26 MED ORDER — SODIUM CHLORIDE 0.9% IV SOLUTION: Freq: Once | INTRAVENOUS | Status: AC

## 2021-10-26 MED ORDER — SODIUM CHLORIDE 0.9 % IV BOLUS
1000.0000 mL | Freq: Once | INTRAVENOUS | Status: AC
  Administered 2021-10-26: 1000 mL via INTRAVENOUS

## 2021-10-26 MED ORDER — ENOXAPARIN SODIUM 80 MG/0.8ML IJ SOSY
80.0000 mg | PREFILLED_SYRINGE | Freq: Two times a day (BID) | INTRAMUSCULAR | Status: DC
Start: 2021-10-26 — End: 2021-10-27
  Administered 2021-10-26 – 2021-10-27 (×2): 80 mg via SUBCUTANEOUS
  Filled 2021-10-26 (×2): qty 0.8

## 2021-10-26 MED ORDER — SODIUM CHLORIDE 0.9 % IV SOLN
250.0000 mg | Freq: Every day | INTRAVENOUS | Status: AC
  Administered 2021-10-26 – 2021-10-27 (×2): 250 mg via INTRAVENOUS
  Filled 2021-10-26 (×2): qty 20

## 2021-10-26 NOTE — Progress Notes (Addendum)
PROGRESS NOTE    Michele Romero  VOJ:[id number] DOB: 1960-06-30 DOA: 10/25/2021 PCP: Pcp, No   Brief Narrative:    Michele Romero is a 61 y.o. 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  She was noted to have some SIRS criteria, but no sign of infection or actual sepsis.  She was admitted for symptomatic management and was noted to have worsening anemia on the morning of 7/15 for which 2 units of PRBCs have been ordered as well as some iron transfusion.  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 -Patient wheelchair-bound secondary to neuropathy   GERD (gastroesophageal reflux disease) - Patient has been out of Protonix and Tums for 2 weeks -Continue Tums and Protonix   Acute anemia with iron deficiency - Hemoglobin 6 today -FOBT negative -Anemia panel with iron deficiency and IV iron will be given -2 unit PRBC transfusion  Right lower extremity DVT -Plan to start on full dose Lovenox and discharge with this for treatment -Avoid use of DOAC at this time given anemia issues with possibility of bleeding -Follow-up with oncology outpatient   Pelvic mass in female - CT abdomen pelvis shows a 24 x 17 x 14 cm mass  extending from the vaginal cuff. -History of endometrial cancer with total hysterectomy and completion of chemotherapy -Dr. Delton Coombes consulted and recommended that gynecology oncology when -Gynecology recommended against transfer to [facility], but gave personal cell phone number to be reached if there are questions over the weekend.  This number will be left in the [facility] physician communication -For now the plan is pain control, management of medical complications, follow-up outpatient with oncology to determine next step -Continue to monitor  Hypomagnesemia -Replete and reevaluate in a.m.   Chronic obstructive pulmonary disease (HCC) - Continue as needed albuterol and scheduled Symbicort    DVT prophylaxis: Lovenox full dose Code Status: Full Family Communication: None at bedside Disposition Plan:  Status is: Inpatient Remains inpatient appropriate because: Need for IV transfusion and medications.   Consultants:  Discussed case with Dr. Berline Lopes 7/15  Procedures:  None  Antimicrobials:  None   Subjective: Patient seen and evaluated today with no new acute complaints or concerns. No acute concerns or events noted overnight.  States that chest pain and abdominal pain is better.  No overt bleeding noted.  Objective: Vitals:   10/26/21 0757 10/26/21 0920 10/26/21 0956 10/26/21 1118  BP:  95/65 90/70   Pulse:  (!) 114 (!) 111 (!) 114  Resp:  '19 19 16  '$ Temp:  98.3 F (36.8 C) 98.3 F (36.8 C) 98.5 F (36.9 C)  TempSrc:  Oral Oral Oral  SpO2: 98% 97% 100% 100%  Weight:      Height:        Intake/Output Summary (Last  24 hours) at 10/26/2021 1159 Last data filed at 10/26/2021 0956 Gross per 24 hour  Intake 3999.29 ml  Output 500 ml  Net 3499.29 ml   Filed Weights   10/25/21 2115  Weight: 86.4 kg    Examination:  General exam: Appears calm and comfortable  Respiratory system: Clear to auscultation. Respiratory effort normal. Cardiovascular system: S1 & S2 heard,  RRR.  Gastrointestinal system: Abdomen rigid with noted mass, nontender Central nervous system: Alert and awake Extremities: No edema Skin: No significant lesions noted Psychiatry: Flat affect.    Data Reviewed: I have personally reviewed following labs and imaging studies  CBC: Recent Labs  Lab 10/25/21 1354 10/26/21 0358 10/26/21 0615  WBC 11.2* 11.4*  --   NEUTROABS 8.7* 8.2*  --   HGB 7.3* 5.8* 6.0*  HCT 23.6* 18.4* 19.1*  MCV 90.8 90.6  --   PLT 318 256  --    Basic Metabolic Panel: Recent Labs  Lab 10/25/21 1354 10/26/21 0358  NA 134* 134*  K 3.9 3.6  CL 101 105  CO2 22 23  GLUCOSE 120* 119*  BUN 10 8  CREATININE 0.74 0.64  CALCIUM 8.6* 7.8*  MG  --  1.5*   GFR: Estimated Creatinine Clearance: 72.7 mL/min (by C-G formula based on SCr of 0.64 mg/dL). Liver Function Tests: Recent Labs  Lab 10/25/21 1354 10/26/21 0358  AST 21 17  ALT 8 8  ALKPHOS 51 43  BILITOT 0.7 0.5  PROT 7.3 5.8*  ALBUMIN 3.0* 2.4*   No results for input(s): "LIPASE", "AMYLASE" in the last 168 hours. No results for input(s): "AMMONIA" in the last 168 hours. Coagulation Profile: No results for input(s): "INR", "PROTIME" in the last 168 hours. Cardiac Enzymes: No results for input(s): "CKTOTAL", "CKMB", "CKMBINDEX", "TROPONINI" in the last 168 hours. BNP (last 3 results) No results for input(s): "PROBNP" in the last 8760 hours. HbA1C: No results for input(s): "HGBA1C" in the last 72 hours. CBG: No results for input(s): "GLUCAP" in the last 168 hours. Lipid Profile: No results for input(s): "CHOL", "HDL", "LDLCALC", "TRIG", "CHOLHDL", "LDLDIRECT" in the last 72 hours. Thyroid Function Tests: No results for input(s): "TSH", "T4TOTAL", "FREET4", "T3FREE", "THYROIDAB" in the last 72 hours. Anemia Panel: Recent Labs    10/26/21 0358  VITAMINB12 344  FOLATE 7.7  FERRITIN 607*  TIBC 176*  IRON 13*  RETICCTPCT 2.9   Sepsis Labs: Recent Labs  Lab 10/25/21 1752  10/25/21 2358 10/26/21 0358 10/26/21 0615  PROCALCITON 0.19  --   --   --   LATICACIDVEN 2.2* 3.1* 1.6 1.9    Recent Results (from the past 240 hour(s))  Culture, blood (routine x 2)     Status: None (Preliminary result)   Collection Time: 10/25/21  2:02 PM   Specimen: BLOOD RIGHT ARM  Result Value Ref Range Status   Specimen Description BLOOD RIGHT ARM  Final   Special Requests   Final    BOTTLES DRAWN AEROBIC AND ANAEROBIC Blood Culture results may not be optimal due to an excessive volume of blood received in culture bottles   Culture   Final    NO GROWTH < 12 HOURS Performed at [facility], [address] [place], [place] [address]    Report Status PENDING  Incomplete  Culture, blood (routine x 2)     Status: None (Preliminary result)   Collection Time: 10/25/21  2:02 PM   Specimen: BLOOD LEFT ARM  Result Value Ref Range Status   Specimen Description  BLOOD LEFT ARM  Final   Special Requests   Final    BOTTLES DRAWN AEROBIC AND ANAEROBIC Blood Culture adequate volume   Culture   Final    NO GROWTH < 12 HOURS Performed at [facility], [address] [place], [place] [address]    Report Status PENDING  Incomplete  MRSA Next Gen by PCR, Nasal     Status: None   Collection Time: 10/25/21  8:40 PM   Specimen: Nasal Mucosa; Nasal Swab  Result Value Ref Range Status   MRSA by PCR Next Gen NOT DETECTED NOT DETECTED Final    Comment: (NOTE) The GeneXpert MRSA Assay (FDA approved for NASAL specimens only), is one component of a comprehensive MRSA colonization surveillance program. It is not intended to diagnose MRSA infection nor to guide or monitor treatment for MRSA infections. Test performance is not FDA approved in patients less than 33 years old. Performed at [facility], [address] [place], [place] [address]          Radiology Studies: DG Abd 1 View  Result Date: 10/26/2021 CLINICAL DATA:  Abdominal pain and pelvic mass. EXAM: ABDOMEN - 1 VIEW COMPARISON:   10/25/2021 FINDINGS: No dilated bowel loops are noted. Nondistended gas-filled loops of small bowel and colon are noted. Increased density in the pelvis is noted displacing bowel loop superiorly, compatible with large pelvic mass identified on recent CT. No acute bony abnormalities are identified. Severe degenerative changes in the RIGHT hip again noted. IMPRESSION: 1. Increased density in the pelvis compatible with large pelvic mass identified on recent CT. No evidence of bowel obstruction on this study. Electronically Signed   By: Margarette Canada M.D.   On: 10/26/2021 08:44   CT Angio Chest/Abd/Pel for Dissection W and/or W/WO  Result Date: 10/25/2021 CLINICAL DATA:  Chest or back pain, aortic dissection suspected. History of endometrial cancer. * Tracking Code: BO * EXAM: CT ANGIOGRAPHY CHEST, ABDOMEN AND PELVIS TECHNIQUE: Non-contrast CT of the chest was initially obtained. Multidetector CT imaging through the chest, abdomen and pelvis was performed using the standard protocol during bolus administration of intravenous contrast. Multiplanar reconstructed images and MIPs were obtained and reviewed to evaluate the vascular anatomy. RADIATION DOSE REDUCTION: This exam was performed according to the departmental dose-optimization program which includes automated exposure control, adjustment of the mA and/or kV according to patient size and/or use of iterative reconstruction technique. CONTRAST:  90m OMNIPAQUE IOHEXOL 350 MG/ML SOLN COMPARISON:  Multiple prior including CTs October 09, 2021 September 17, 2021 and July 02, 2021. FINDINGS: CTA CHEST FINDINGS Cardiovascular: Noncontrast sequence demonstrates aortic atherosclerosis without intramural hematoma. Accessed right chest Port-A-Cath with tip near the superior cavoatrial junction. Preferential opacification of the thoracic aorta. No evidence of thoracic aortic aneurysm or dissection. No central pulmonary embolus on this nondedicated study. Normal heart size. No  significant pericardial effusion/thickening. Mediastinum/Nodes: Pathologically enlarged mediastinal, hilar or axillary lymph nodes. Small hiatal hernia. Lungs/Pleura: No suspicious pulmonary nodules or masses. No focal airspace consolidation. Pleural effusion. No pneumothorax. Musculoskeletal: Thoracic spondylosis. No suspicious chest wall mass. Review of the MIP images confirms the above findings. CTA ABDOMEN AND PELVIS FINDINGS VASCULAR Aorta: Normal caliber aorta without aneurysm, dissection, vasculitis or significant stenosis. Celiac: Patent without evidence of aneurysm, dissection, vasculitis or significant stenosis. SMA: Patent without evidence of aneurysm, dissection, vasculitis or significant stenosis. Renals: Both renal arteries are patent without evidence of aneurysm, dissection, vasculitis, fibromuscular dysplasia or significant stenosis. IMA: Patent without evidence of aneurysm, dissection, vasculitis or  significant stenosis. Inflow: Patent without evidence of aneurysm, dissection, vasculitis or significant stenosis. Veins: No obvious venous abnormality within the limitations of this arterial phase study. Review of the MIP images confirms the above findings. NON-VASCULAR Hepatobiliary: No suspicious hepatic lesion. Gallbladder surgically absent. No biliary ductal dilation. Pancreas: No pancreatic ductal dilation or evidence of acute inflammation. Spleen: No splenomegaly or focal splenic lesion. Adrenals/Urinary Tract: Bilateral adrenal glands appear normal. Prominence of the right renal collecting system and proximal ureter to the level of the large pelvic mass with delayed right renal enhancement. Left kidney is unremarkable. No suspicious renal mass. Urinary bladder is minimally distended limiting evaluation. Stomach/Bowel: No radiopaque enteric contrast material was administered. Small hiatal hernia otherwise the stomach is unremarkable for degree of distension. Effacement and multifocal narrowing of  large and small bowel loops by the large pelvic mass. Prominent loops of small bowel in the right hemiabdomen with interposed effacement and multifocal narrowing of small-bowel loops Lymphatic: Enlarged iliac side chain and inguinal lymph nodes. For reference a left inguinal lymph node measures 11 mm in short axis on image 185/6 previously 6 mm in short axis, and a left external iliac lymph node measures 9 mm in short axis on image 165/6 previously 7 mm in short axis. No abdominal adenopathy. Reproductive: Prior hysterectomy. There is a large heterogeneous mass extending from the vaginal cuff into the abdomen which appears to demonstrate nodular areas postcontrast enhancement and measures 17.3 x 14.7 x 24.1 cm on images 151/6 and 109/11. The adnexa are obscured by the large pelvic mass. Other: Trace right-sided abdominopelvic free fluid with possible nodular peritoneal thickening. Nonspecific subcutaneous edema. Musculoskeletal: Multilevel degenerative change of the spine. No aggressive lytic or blastic lesion of bone. Severe right hip degenerative change Review of the MIP images confirms the above findings. IMPRESSION: 1. 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 2. Increased size of prominent/mildly enlarged iliac side chain and inguinal lymph nodes which are suspicious for disease involvement. 3. Prominent loops of small bowel in the right hemiabdomen with interposed loops of nondilated bowel, no upstream dilation of small bowel or discrete abrupt transition to decompressed bowel, favored to be reactive or reflect focal enteritis. However an early/partial small bowel obstruction related to the large pelvic mass can not be excluded, recommend close clinical interval follow-up. 4. Right-sided obstructive uropathy related to the large pelvic mass. Consider correlation with laboratory  values to exclude superimposed infection. 5. Trace right-sided abdominopelvic free fluid possible nodular peritoneal thickening but poorly evaluated on this examination. These results were called by telephone at the time of interpretation on 10/25/2021 at 2:59 pm to provider Dr. Langston Masker, who verbally acknowledged these results. Electronically Signed   By: Dahlia Bailiff M.D.   On: 10/25/2021 15:30   CT Head Wo Contrast  Result Date: 10/25/2021 CLINICAL DATA:  Neuro deficit EXAM: CT HEAD WITHOUT CONTRAST TECHNIQUE: Contiguous axial images were obtained from the base of the skull through the vertex without intravenous contrast. RADIATION DOSE REDUCTION: This exam was performed according to the departmental dose-optimization program which includes automated exposure control, adjustment of the mA and/or kV according to patient size and/or use of iterative reconstruction technique. COMPARISON:  Head CT dated July 27, 2014 FINDINGS: Brain: No evidence of acute infarction, hemorrhage, hydrocephalus, extra-axial collection or mass lesion/mass effect. Vascular: No hyperdense vessel or unexpected calcification. Skull: Normal. Negative for fracture or focal lesion.  Sinuses/Orbits: No acute finding. Other: None. IMPRESSION: No acute intracranial abnormality. Electronically Signed   By: Yetta Glassman M.D.   On: 10/25/2021 14:42        Scheduled Meds:  Chlorhexidine Gluconate Cloth  6 each Topical Q0600   gabapentin  300 mg Oral TID   heparin  5,000 Units Subcutaneous Q8H   mometasone-formoterol  2 puff Inhalation BID   pantoprazole  40 mg Oral Daily   cyanocobalamin  500 mcg Oral Daily   Continuous Infusions:  ferric gluconate (FERRLECIT) IVPB       LOS: 1 day    Time spent: 35 minutes    Lionardo Haze Darleen Crocker, DO [facility]  If 7PM-7AM, please contact night-coverage [email or web address] 10/26/2021, 11:59 AM

## 2021-10-26 NOTE — Progress Notes (Signed)
Pt unable to sign blood consent form d/t arthritis of her hand. This nurse along with Juliette Mangle verified and acknowledged a verbal consent from the pt. Pt is a+ox4

## 2021-10-26 NOTE — Plan of Care (Signed)

## 2021-10-26 NOTE — Progress Notes (Signed)
ANTICOAGULATION CONSULT NOTE - Initial Consult  Pharmacy Consult for Lovenox Indication: DVT  Allergies  Allergen Reactions   Ivp Dye [Iodinated Contrast Media] Nausea And Vomiting    Patient had an episode of projectile vomiting after IV injection. Patient states it happens every time she has IV contrast.     Codeine Nausea And Vomiting and Rash    Patient Measurements: Height: '5\' 1"'$  (154.9 cm) Weight: 86.4 kg (190 lb 7.6 oz) IBW/kg (Calculated) : 47.8  Vital Signs: Temp: 98.2 F (36.8 C) (07/15 1312) Temp Source: Oral (07/15 1312) BP: 88/69 (07/15 1312) Pulse Rate: 118 (07/15 1312)  Labs: Recent Labs    10/25/21 1354 10/25/21 1522 10/26/21 0358 10/26/21 0615  HGB 7.3*  --  5.8* 6.0*  HCT 23.6*  --  18.4* 19.1*  PLT 318  --  256  --   CREATININE 0.74  --  0.64  --   TROPONINIHS 2 <2  --   --     Estimated Creatinine Clearance: 72.7 mL/min (by C-G formula based on SCr of 0.64 mg/dL).   Medical History: Past Medical History:  Diagnosis Date   Ambulates with cane 10/31/2020   Arthritis    RIGHT HIP   Asthma    Cigarette nicotine dependence without complication 29/79/8921   Complex tear of medial meniscus of right knee 01/20/2017   COPD (chronic obstructive pulmonary disease) ([place])    ENDOMETRIAL CANCER 10/31/2020   Endometrial cancer ([place]) 11/15/2020   GERD (gastroesophageal reflux disease)    History of radiation therapy    HDR brachytherapy Michele Romero  Dr Michele Romero   Migraines    Port-Romero-Cath in place 01/01/2021    Medications:  Medications Prior to Admission  Medication Sig Dispense Refill Last Dose   acetaminophen (TYLENOL) 500 MG tablet Take 1,000 mg by mouth every 6 (six) hours as needed for moderate pain.   10/24/2021   albuterol (VENTOLIN HFA) 108 (90 Base) MCG/ACT inhaler Inhale 2 puffs into the lungs every 6 (six) hours as needed for wheezing or shortness of breath. 8 g 2 unk   budesonide-formoterol (SYMBICORT) 80-4.5 MCG/ACT  inhaler Inhale 2 puffs into the lungs 2 (two) times daily. 10.2 g 2 unk   gabapentin (NEURONTIN) 300 MG capsule Take 1 capsule (300 mg total) by mouth 3 (three) times daily. Take additional capsule at bed time for total dose '600mg'$  120 capsule 6 10/24/2021   loperamide (IMODIUM) 2 MG capsule Take 1 capsule (2 mg total) by mouth as needed for diarrhea or loose stools (Take 2 Capsules after the 1st loose stool and then 1 capsule adter each loose stool. Do not exceed 8 capsules in Romero 24-hr period. If it is bedtime and you are having loose stools, take 2 capsules at bestime and then take 2 capsules every 4 hours until morning.). 30 capsule 0 Past Week   magnesium oxide (MAG-OX) 400 (240 Mg) MG tablet Take 1 tablet (400 mg total) by mouth in the morning, at noon, and at bedtime. 90 tablet 6 10/24/2021   Multiple Vitamin (MULTIVITAMIN WITH MINERALS) TABS tablet Take 1 tablet by mouth daily.   Past Month   potassium chloride SA (KLOR-CON M) 20 MEQ tablet Take 1 tablet (20 mEq total) by mouth daily. 30 tablet 6 10/24/2021   vitamin B-12 (CYANOCOBALAMIN) 500 MCG tablet Take 500 mcg by mouth daily.   10/24/2021    Assessment: Michele Romero is Romero 61 y.o. female with medical history significant of COPD endometrial cancer, GERD, difficulty  with ambulation secondary to neuropathy which is Romero result of chemotherapy per patient, and more presents to ED with Romero chief complaint of chest and stomach pain.  She was noted to have some SIRS criteria, but no sign of infection or actual sepsis.  She was admitted for symptomatic management and was noted to have worsening anemia on the morning of 7/15 for which 2 units of PRBCs have been ordered as well as some iron transfusion.  Asked to initiate Lovenox for VTE treatment, CBC noted.  Pt received Heparin 5000 units SQ earlier this am  Goal of Therapy:  Anti-Xa level 0.6-1 units/ml 4hrs after LMWH dose given Monitor platelets by anticoagulation protocol: Yes   Plan:  Lovenox  '1mg'$ /Kg SQ q12hrs Monitor CBC, s/sx bleeding complications  Michele Romero 10/26/2021,1:17 PM

## 2021-10-27 DIAGNOSIS — R6510 Systemic inflammatory response syndrome (SIRS) of non-infectious origin without acute organ dysfunction: Secondary | ICD-10-CM | POA: Diagnosis not present

## 2021-10-27 LAB — BASIC METABOLIC PANEL
Anion gap: 10 (ref 5–15)
BUN: 7 mg/dL — ABNORMAL LOW (ref 8–23)
CO2: 22 mmol/L (ref 22–32)
Calcium: 8.3 mg/dL — ABNORMAL LOW (ref 8.9–10.3)
Chloride: 104 mmol/L (ref 98–111)
Creatinine, Ser: 0.62 mg/dL (ref 0.44–1.00)
GFR, Estimated: >60 mL/min (ref >60)
Glucose, Bld: 125 mg/dL — ABNORMAL HIGH (ref 70–99)
Potassium: 4 mmol/L (ref 3.5–5.1)
Sodium: 136 mmol/L (ref 135–145)

## 2021-10-27 LAB — TYPE AND SCREEN
ABO/RH(D): O POS
Antibody Screen: NEGATIVE
Unit division: 0
Unit division: 0

## 2021-10-27 LAB — BPAM RBC
Blood Product Expiration Date: 202308102359
Blood Product Expiration Date: 202308222359
ISSUE DATE / TIME: 202307150933
ISSUE DATE / TIME: 202307151240
Unit Type and Rh: 5100
Unit Type and Rh: 5100

## 2021-10-27 LAB — CBC
HCT: 27.1 % — ABNORMAL LOW (ref 36.0–46.0)
Hemoglobin: 8.9 g/dL — ABNORMAL LOW (ref 12.0–15.0)
MCH: 29.1 pg (ref 26.0–34.0)
MCHC: 32.8 g/dL (ref 30.0–36.0)
MCV: 88.6 fL (ref 80.0–100.0)
Platelets: 256 10*3/uL (ref 150–400)
RBC: 3.06 MIL/uL — ABNORMAL LOW (ref 3.87–5.11)
RDW: 16.2 % — ABNORMAL HIGH (ref 11.5–15.5)
WBC: 10.5 10*3/uL (ref 4.0–10.5)
nRBC: 0 % (ref 0.0–0.2)

## 2021-10-27 LAB — URINE CULTURE: Culture: <10000 — AB

## 2021-10-27 LAB — MAGNESIUM: Magnesium: 1.7 mg/dL (ref 1.7–2.4)

## 2021-10-27 MED ORDER — BISACODYL 10 MG RE SUPP
10.0000 mg | Freq: Once | RECTAL | Status: AC
Start: 2021-10-27 — End: 2021-10-27
  Administered 2021-10-27: 10 mg via RECTAL
  Filled 2021-10-27: qty 1

## 2021-10-27 MED ORDER — HEPARIN SOD (PORK) LOCK FLUSH 100 UNIT/ML IV SOLN
500.0000 [IU] | Freq: Once | INTRAVENOUS | Status: AC
  Administered 2021-10-27: 500 [IU] via INTRAVENOUS
  Filled 2021-10-27: qty 5

## 2021-10-27 NOTE — Discharge Summary (Signed)
Physician Discharge Summary  Michele Romero EHU:[id number] DOB: 1960/09/06 DOA: 10/25/2021  PCP: Pcp, No  Admit date: 10/25/2021  Discharge date: 10/27/2021  Admitted From:Home  Disposition:  Home  Recommendations for Outpatient Follow-up:  Follow up with PCP in 1-2 weeks Follow-up with Dr. Berline Lopes and Dr. Delton Coombes with referral sent for follow-up appointments Superficial thrombophlebitis noted on right lower extremity imaging, no need for anticoagulation at this time Repeat CBC in 1 week to ensure stability Continue home medications as prior  Home Health: None  Equipment/Devices: None  Discharge Condition:Stable  CODE STATUS: Full  Diet recommendation: Heart Healthy  Brief/Interim Summary: Michele Romero is a 61 y.o. 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  She was noted to have some SIRS criteria, but no sign of infection or actual sepsis.  She was admitted for symptomatic management and was noted to have worsening anemia on the morning of 7/15 for which 2 units of PRBCs.  Her hemoglobin levels have improved and she has had no further symptoms or any overt bleeding noted.  She was noted to be iron deficient and has received some IV iron as well.  She was also noted to have some pain and swelling to her right lower extremity, but ultrasound testing did not show DVT and rather showed some superficial thrombophlebitis.  She will not require any anticoagulation in the outpatient setting at this time.  She will have close follow-up arranged with her oncologist in the outpatient setting.  Discharge Diagnoses:  Principal Problem:   SIRS (systemic inflammatory response syndrome) (HCC) Active Problems:   Chronic obstructive pulmonary disease (HCC)   Pelvic mass in female   Anemia    GERD (gastroesophageal reflux disease)   Neuropathy  Principal discharge diagnosis: Acute anemia status post 2 unit PRBC transfusion with noted history of chemotherapy and myelosuppression as well as iron deficiency.  SIRS criteria with sepsis ruled out.  Discharge Instructions  Discharge Instructions     Diet - low sodium heart healthy   Complete by: As directed    Increase activity slowly   Complete by: As directed       Allergies as of 10/27/2021       Reactions   Ivp Dye [iodinated Contrast Media] Nausea And Vomiting   Patient had an episode of projectile vomiting after IV injection. Patient states it happens every time she has IV contrast.     Codeine Nausea And Vomiting, Rash        Medication List     TAKE these medications    acetaminophen 500 MG tablet Commonly known as: TYLENOL Take 1,000 mg by mouth every 6 (six) hours as needed for moderate pain.   albuterol 108 (90 Base) MCG/ACT inhaler Commonly known as: VENTOLIN HFA Inhale 2 puffs into the lungs every 6 (six) hours as needed for wheezing or shortness of breath.   budesonide-formoterol 80-4.5 MCG/ACT inhaler Commonly known as: SYMBICORT Inhale 2 puffs into the lungs 2 (two) times daily.   cyanocobalamin 500 MCG tablet Commonly known as: CYANOCOBALAMIN Take 500 mcg by mouth daily.   gabapentin 300 MG capsule Commonly known as: NEURONTIN Take 1 capsule (300 mg total) by mouth 3 (three) times daily. Take additional capsule at bed time for total dose '600mg'$    loperamide 2 MG capsule Commonly known as: IMODIUM  Take 1 capsule (2 mg total) by mouth as needed for diarrhea or loose stools (Take 2 Capsules after the 1st loose stool and then 1 capsule adter each loose stool. Do not exceed 8 capsules in a 24-hr period. If it is bedtime and you are having loose stools, take 2 capsules at bestime and then take 2 capsules every 4 hours until morning.).   magnesium oxide 400 (240 Mg) MG tablet Commonly known as:  MAG-OX Take 1 tablet (400 mg total) by mouth in the morning, at noon, and at bedtime.   multivitamin with minerals Tabs tablet Take 1 tablet by mouth daily.   potassium chloride SA 20 MEQ tablet Commonly known as: KLOR-CON M Take 1 tablet (20 mEq total) by mouth daily.        Follow-up Information     Lafonda Mosses, MD. Go to.   Specialty: Gynecologic Oncology Contact information: [place] [address] [phone]         Derek Jack, MD. Go to.   Specialty: Hematology Contact information: [place] [address] [phone]                Allergies  Allergen Reactions   Ivp Dye [Iodinated Contrast Media] Nausea And Vomiting    Patient had an episode of projectile vomiting after IV injection. Patient states it happens every time she has IV contrast.     Codeine Nausea And Vomiting and Rash    Consultations: Discussed with Dr. Berline Lopes   Procedures/Studies: US Venous Img Lower Unilateral Right (DVT)  Result Date: 10/26/2021 CLINICAL DATA:  Right lower extremity edema. EXAM: RIGHT LOWER EXTREMITY VENOUS DOPPLER ULTRASOUND TECHNIQUE: Gray-scale sonography with graded compression, as well as color Doppler and duplex ultrasound were performed to evaluate the lower extremity deep venous systems from the level of the common femoral vein and including the common femoral, femoral, profunda femoral, popliteal and calf veins including the posterior tibial, peroneal and gastrocnemius veins when visible. The superficial great saphenous vein was also interrogated. Spectral Doppler was utilized to evaluate flow at rest and with distal augmentation maneuvers in the common femoral, femoral and popliteal veins. COMPARISON:  None Available. FINDINGS: Contralateral Common Femoral Vein: Respiratory phasicity is normal and symmetric with the symptomatic side. No evidence of thrombus. Normal compressibility. Common Femoral Vein: No evidence  of thrombus. Normal compressibility, respiratory phasicity and response to augmentation. Saphenofemoral Junction: Thrombus in the upper segment of the great saphenous vein extends to the saphenofemoral junction and just into the common femoral vein but does not cause any flow restriction or luminal stenosis of the common femoral vein. Profunda Femoral Vein: No evidence of thrombus. Normal compressibility and flow on color Doppler imaging. Femoral Vein: No evidence of thrombus. Normal compressibility, respiratory phasicity and response to augmentation. Popliteal Vein: No evidence of thrombus. Normal compressibility, respiratory phasicity and response to augmentation. Calf Veins: No evidence of thrombus. Normal compressibility and flow on color Doppler imaging. Superficial Great Saphenous Vein: Thrombus in the upper thigh segment of the great saphenous vein. More distal segments appear normally patent. Venous Reflux:  None. Other Findings:  No abnormal fluid collections. IMPRESSION: Superficial thrombophlebitis of the upper thigh segment of the right great saphenous vein with thrombus just extending across the saphenofemoral junction into the common femoral vein but not causing any flow restriction or luminal stenosis in the common femoral vein. Electronically Signed   By: Aletta Edouard M.D.   On: 10/26/2021 16:27   DG  Abd 1 View  Result Date: 10/26/2021 CLINICAL DATA:  Abdominal pain and pelvic mass. EXAM: ABDOMEN - 1 VIEW COMPARISON:  10/25/2021 FINDINGS: No dilated bowel loops are noted. Nondistended gas-filled loops of small bowel and colon are noted. Increased density in the pelvis is noted displacing bowel loop superiorly, compatible with large pelvic mass identified on recent CT. No acute bony abnormalities are identified. Severe degenerative changes in the RIGHT hip again noted. IMPRESSION: 1. Increased density in the pelvis compatible with large pelvic mass identified on recent CT. No evidence of bowel  obstruction on this study. Electronically Signed   By: Margarette Canada M.D.   On: 10/26/2021 08:44   CT Angio Chest/Abd/Pel for Dissection W and/or W/WO  Result Date: 10/25/2021 CLINICAL DATA:  Chest or back pain, aortic dissection suspected. History of endometrial cancer. * Tracking Code: BO * EXAM: CT ANGIOGRAPHY CHEST, ABDOMEN AND PELVIS TECHNIQUE: Non-contrast CT of the chest was initially obtained. Multidetector CT imaging through the chest, abdomen and pelvis was performed using the standard protocol during bolus administration of intravenous contrast. Multiplanar reconstructed images and MIPs were obtained and reviewed to evaluate the vascular anatomy. RADIATION DOSE REDUCTION: This exam was performed according to the departmental dose-optimization program which includes automated exposure control, adjustment of the mA and/or kV according to patient size and/or use of iterative reconstruction technique. CONTRAST:  56m OMNIPAQUE IOHEXOL 350 MG/ML SOLN COMPARISON:  Multiple prior including CTs October 09, 2021 September 17, 2021 and July 02, 2021. FINDINGS: CTA CHEST FINDINGS Cardiovascular: Noncontrast sequence demonstrates aortic atherosclerosis without intramural hematoma. Accessed right chest Port-A-Cath with tip near the superior cavoatrial junction. Preferential opacification of the thoracic aorta. No evidence of thoracic aortic aneurysm or dissection. No central pulmonary embolus on this nondedicated study. Normal heart size. No significant pericardial effusion/thickening. Mediastinum/Nodes: Pathologically enlarged mediastinal, hilar or axillary lymph nodes. Small hiatal hernia. Lungs/Pleura: No suspicious pulmonary nodules or masses. No focal airspace consolidation. Pleural effusion. No pneumothorax. Musculoskeletal: Thoracic spondylosis. No suspicious chest wall mass. Review of the MIP images confirms the above findings. CTA ABDOMEN AND PELVIS FINDINGS VASCULAR Aorta: Normal caliber aorta without aneurysm,  dissection, vasculitis or significant stenosis. Celiac: Patent without evidence of aneurysm, dissection, vasculitis or significant stenosis. SMA: Patent without evidence of aneurysm, dissection, vasculitis or significant stenosis. Renals: Both renal arteries are patent without evidence of aneurysm, dissection, vasculitis, fibromuscular dysplasia or significant stenosis. IMA: Patent without evidence of aneurysm, dissection, vasculitis or significant stenosis. Inflow: Patent without evidence of aneurysm, dissection, vasculitis or significant stenosis. Veins: No obvious venous abnormality within the limitations of this arterial phase study. Review of the MIP images confirms the above findings. NON-VASCULAR Hepatobiliary: No suspicious hepatic lesion. Gallbladder surgically absent. No biliary ductal dilation. Pancreas: No pancreatic ductal dilation or evidence of acute inflammation. Spleen: No splenomegaly or focal splenic lesion. Adrenals/Urinary Tract: Bilateral adrenal glands appear normal. Prominence of the right renal collecting system and proximal ureter to the level of the large pelvic mass with delayed right renal enhancement. Left kidney is unremarkable. No suspicious renal mass. Urinary bladder is minimally distended limiting evaluation. Stomach/Bowel: No radiopaque enteric contrast material was administered. Small hiatal hernia otherwise the stomach is unremarkable for degree of distension. Effacement and multifocal narrowing of large and small bowel loops by the large pelvic mass. Prominent loops of small bowel in the right hemiabdomen with interposed effacement and multifocal narrowing of small-bowel loops Lymphatic: Enlarged iliac side chain and inguinal lymph nodes. For reference a left inguinal lymph node measures 11  mm in short axis on image 185/6 previously 6 mm in short axis, and a left external iliac lymph node measures 9 mm in short axis on image 165/6 previously 7 mm in short axis. No abdominal  adenopathy. Reproductive: Prior hysterectomy. There is a large heterogeneous mass extending from the vaginal cuff into the abdomen which appears to demonstrate nodular areas postcontrast enhancement and measures 17.3 x 14.7 x 24.1 cm on images 151/6 and 109/11. The adnexa are obscured by the large pelvic mass. Other: Trace right-sided abdominopelvic free fluid with possible nodular peritoneal thickening. Nonspecific subcutaneous edema. Musculoskeletal: Multilevel degenerative change of the spine. No aggressive lytic or blastic lesion of bone. Severe right hip degenerative change Review of the MIP images confirms the above findings. IMPRESSION: 1. 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 2. Increased size of prominent/mildly enlarged iliac side chain and inguinal lymph nodes which are suspicious for disease involvement. 3. Prominent loops of small bowel in the right hemiabdomen with interposed loops of nondilated bowel, no upstream dilation of small bowel or discrete abrupt transition to decompressed bowel, favored to be reactive or reflect focal enteritis. However an early/partial small bowel obstruction related to the large pelvic mass can not be excluded, recommend close clinical interval follow-up. 4. Right-sided obstructive uropathy related to the large pelvic mass. Consider correlation with laboratory values to exclude superimposed infection. 5. Trace right-sided abdominopelvic free fluid possible nodular peritoneal thickening but poorly evaluated on this examination. These results were called by telephone at the time of interpretation on 10/25/2021 at 2:59 pm to provider Dr. Langston Masker, who verbally acknowledged these results. Electronically Signed   By: Dahlia Bailiff M.D.   On: 10/25/2021 15:30   CT Head Wo Contrast  Result Date: 10/25/2021 CLINICAL DATA:  Neuro deficit EXAM: CT  HEAD WITHOUT CONTRAST TECHNIQUE: Contiguous axial images were obtained from the base of the skull through the vertex without intravenous contrast. RADIATION DOSE REDUCTION: This exam was performed according to the departmental dose-optimization program which includes automated exposure control, adjustment of the mA and/or kV according to patient size and/or use of iterative reconstruction technique. COMPARISON:  Head CT dated July 27, 2014 FINDINGS: Brain: No evidence of acute infarction, hemorrhage, hydrocephalus, extra-axial collection or mass lesion/mass effect. Vascular: No hyperdense vessel or unexpected calcification. Skull: Normal. Negative for fracture or focal lesion. Sinuses/Orbits: No acute finding. Other: None. IMPRESSION: No acute intracranial abnormality. Electronically Signed   By: Yetta Glassman M.D.   On: 10/25/2021 14:42   CT Angio Chest PE W and/or Wo Contrast  Result Date: 10/09/2021 CLINICAL DATA:  Shortness of breath and chest pain EXAM: CT ANGIOGRAPHY CHEST WITH CONTRAST TECHNIQUE: Multidetector CT imaging of the chest was performed using the standard protocol during bolus administration of intravenous contrast. Multiplanar CT image reconstructions and MIPs were obtained to evaluate the vascular anatomy. RADIATION DOSE REDUCTION: This exam was performed according to the departmental dose-optimization program which includes automated exposure control, adjustment of the mA and/or kV according to patient size and/or use of iterative reconstruction technique. CONTRAST:  171m OMNIPAQUE IOHEXOL 350 MG/ML SOLN COMPARISON:  CT 09/17/2021 FINDINGS: Cardiovascular: Satisfactory opacification of the pulmonary arteries to the segmental level. No evidence of pulmonary embolism. Normal heart size. No pericardial effusion. Nonaneurysmal aorta. No dissection. Mild atherosclerosis. Right-sided central venous catheter tip at the cavoatrial region. Normal cardiac size. No pericardial effusion  Mediastinum/Nodes: No enlarged mediastinal,  hilar, or axillary lymph nodes. Thyroid gland, trachea, and esophagus demonstrate no significant findings. Lungs/Pleura: Lungs are clear. No pleural effusion or pneumothorax. Upper Abdomen: No acute abnormality. Musculoskeletal: No chest wall abnormality. No acute or significant osseous findings. Review of the MIP images confirms the above findings. IMPRESSION: 1. No CT evidence for acute pulmonary embolus or aortic dissection. 2. Clear lung fields Aortic Atherosclerosis (ICD10-I70.0). Electronically Signed   By: Donavan Foil M.D.   On: 10/09/2021 22:35     Discharge Exam: Vitals:   10/27/21 0741 10/27/21 0800  BP:  90/64  Pulse:  (!) 107  Resp:  (!) 22  Temp: 98.3 F (36.8 C)   SpO2:  100%   Vitals:   10/27/21 0500 10/27/21 0710 10/27/21 0741 10/27/21 0800  BP: 92/65 93/67  90/64  Pulse: (!) 55 (!) 106  (!) 107  Resp: 12 18  (!) 22  Temp:   98.3 F (36.8 C)   TempSrc:   Oral   SpO2: 100% (!) 83%  100%  Weight: 86.4 kg     Height:        General: Pt is alert, awake, not in acute distress Cardiovascular: RRR, S1/S2 +, no rubs, no gallops Respiratory: CTA bilaterally, no wheezing, no rhonchi Abdominal: Soft, NT, ND, bowel sounds + Extremities: no edema, no cyanosis    The results of significant diagnostics from this hospitalization (including imaging, microbiology, ancillary and laboratory) are listed below for reference.     Microbiology: Recent Results (from the past 240 hour(s))  Urine Culture     Status: Abnormal   Collection Time: 10/25/21  1:36 PM   Specimen: Urine, Clean Catch  Result Value Ref Range Status   Specimen Description   Final    URINE, CLEAN CATCH Performed at [facility], [address], [place], [place] [address]    Special Requests   Final    NONE Performed at [facility], [address] [place], [place] [address]    Culture (A)  Final    <10,000 COLONIES/mL INSIGNIFICANT GROWTH Performed at [facility] [address] [place], [place] [address]    Report Status 10/27/2021 FINAL  Final  Culture, blood (routine x 2)     Status: None (Preliminary result)   Collection Time: 10/25/21  2:02 PM   Specimen: BLOOD RIGHT ARM  Result Value Ref Range Status   Specimen Description BLOOD RIGHT ARM  Final   Special Requests   Final    BOTTLES DRAWN AEROBIC AND ANAEROBIC Blood Culture results may not be optimal due to an excessive volume of blood received in culture bottles   Culture   Final    NO GROWTH 2 DAYS Performed at [facility], [address] [place], [place] [address]    Report Status PENDING  Incomplete  Culture, blood (routine x 2)     Status: None (Preliminary result)   Collection Time: 10/25/21  2:02 PM   Specimen: BLOOD LEFT ARM  Result Value Ref Range Status   Specimen Description BLOOD LEFT ARM  Final   Special Requests   Final    BOTTLES DRAWN AEROBIC AND ANAEROBIC Blood Culture adequate volume   Culture   Final    NO GROWTH 2 DAYS Performed at [facility], [address] [place],  [address]    Report Status PENDING  Incomplete  MRSA Next Gen by PCR, Nasal     Status: None   Collection Time: 10/25/21  8:40 PM  Specimen: Nasal Mucosa; Nasal Swab  Result Value Ref Range Status   MRSA by PCR Next Gen NOT DETECTED NOT DETECTED Final    Comment: (NOTE) The GeneXpert MRSA Assay (FDA approved for NASAL specimens only), is one component of a comprehensive MRSA colonization surveillance program. It is not intended to diagnose MRSA infection nor to guide or monitor treatment for MRSA infections. Test performance is not FDA approved in patients less than 11 years old. Performed at [facility], [address], [place], [place] [address]      Labs: BNP (last 3 results) No results for input(s): "BNP" in the last 8760 hours. Basic Metabolic Panel: Recent Labs  Lab 10/25/21 1354 10/26/21 0358 10/27/21 0420  NA 134* 134* 136  K 3.9 3.6 4.0  CL 101 105  104  CO2 '22 23 22  '$ GLUCOSE 120* 119* 125*  BUN 10 8 7*  CREATININE 0.74 0.64 0.62  CALCIUM 8.6* 7.8* 8.3*  MG  --  1.5* 1.7   Liver Function Tests: Recent Labs  Lab 10/25/21 1354 10/26/21 0358  AST 21 17  ALT 8 8  ALKPHOS 51 43  BILITOT 0.7 0.5  PROT 7.3 5.8*  ALBUMIN 3.0* 2.4*   No results for input(s): "LIPASE", "AMYLASE" in the last 168 hours. No results for input(s): "AMMONIA" in the last 168 hours. CBC: Recent Labs  Lab 10/25/21 1354 10/26/21 0358 10/26/21 0615 10/26/21 1756 10/26/21 1942 10/27/21 0420  WBC 11.2* 11.4*  --   --   --  10.5  NEUTROABS 8.7* 8.2*  --   --   --   --   HGB 7.3* 5.8* 6.0* QUESTIONABLE RESULTS, RECOMMEND RECOLLECT TO VERIFY 9.0* 8.9*  HCT 23.6* 18.4* 19.1* QUESTIONABLE RESULTS, RECOMMEND RECOLLECT TO VERIFY 27.1* 27.1*  MCV 90.8 90.6  --   --   --  88.6  PLT 318 256  --   --   --  256   Cardiac Enzymes: No results for input(s): "CKTOTAL", "CKMB", "CKMBINDEX", "TROPONINI" in the last 168 hours. BNP: Invalid input(s): "POCBNP" CBG: No results for input(s): "GLUCAP" in the last 168 hours. D-Dimer No results for input(s): "DDIMER" in the last 72 hours. Hgb A1c No results for input(s): "HGBA1C" in the last 72 hours. Lipid Profile No results for input(s): "CHOL", "HDL", "LDLCALC", "TRIG", "CHOLHDL", "LDLDIRECT" in the last 72 hours. Thyroid function studies No results for input(s): "TSH", "T4TOTAL", "T3FREE", "THYROIDAB" in the last 72 hours.  Invalid input(s): "FREET3" Anemia work up Recent Labs    10/26/21 0358  VITAMINB12 344  FOLATE 7.7  FERRITIN 607*  TIBC 176*  IRON 13*  RETICCTPCT 2.9   Urinalysis    Component Value Date/Time   COLORURINE YELLOW 10/25/2021 1636   APPEARANCEUR CLEAR 10/25/2021 1636   LABSPEC >1.046 (H) 10/25/2021 1636   PHURINE 6.0 10/25/2021 1636   GLUCOSEU NEGATIVE 10/25/2021 1636   HGBUR NEGATIVE 10/25/2021 1636   BILIRUBINUR NEGATIVE 10/25/2021 1636   KETONESUR NEGATIVE 10/25/2021 1636    PROTEINUR NEGATIVE 10/25/2021 1636   UROBILINOGEN 0.2 10/14/2010 1536   NITRITE NEGATIVE 10/25/2021 1636   LEUKOCYTESUR TRACE (A) 10/25/2021 1636   Sepsis Labs Recent Labs  Lab 10/25/21 1354 10/26/21 0358 10/27/21 0420  WBC 11.2* 11.4* 10.5   Microbiology Recent Results (from the past 240 hour(s))  Urine Culture     Status: Abnormal   Collection Time: 10/25/21  1:36 PM   Specimen: Urine, Clean Catch  Result Value Ref Range Status   Specimen Description   Final  URINE, CLEAN CATCH Performed at [facility], [address] [place], [place] [address]    Special Requests   Final    NONE Performed at [facility], [address], [place], [place] [address]    Culture (A)  Final    <10,000 COLONIES/mL INSIGNIFICANT GROWTH Performed at [facility] [address] [place], [place] [address]    Report Status 10/27/2021 FINAL  Final  Culture, blood (routine x 2)     Status: None (Preliminary result)   Collection Time: 10/25/21  2:02 PM   Specimen: BLOOD RIGHT ARM  Result Value Ref Range Status   Specimen Description BLOOD RIGHT ARM  Final   Special Requests   Final    BOTTLES DRAWN AEROBIC AND ANAEROBIC Blood Culture results may not be optimal due to an excessive volume of blood received in culture bottles   Culture   Final    NO GROWTH 2 DAYS Performed at [facility], [address], [place], [place] [address]    Report Status PENDING  Incomplete  Culture, blood (routine x 2)     Status: None (Preliminary result)   Collection Time: 10/25/21  2:02 PM   Specimen: BLOOD LEFT ARM  Result Value Ref Range Status   Specimen Description BLOOD LEFT ARM  Final   Special Requests   Final    BOTTLES DRAWN AEROBIC AND ANAEROBIC Blood Culture adequate volume   Culture   Final    NO GROWTH 2 DAYS Performed at [facility], [address] [place], [place] [address]    Report Status PENDING  Incomplete  MRSA Next Gen by PCR, Nasal     Status: None   Collection Time: 10/25/21   8:40 PM   Specimen: Nasal Mucosa; Nasal Swab  Result Value Ref Range Status   MRSA by PCR Next Gen NOT DETECTED NOT DETECTED Final    Comment: (NOTE) The GeneXpert MRSA Assay (FDA approved for NASAL specimens only), is one component of a comprehensive MRSA colonization surveillance program. It is not intended to diagnose MRSA infection nor to guide or monitor treatment for MRSA infections. Test performance is not FDA approved in patients less than 62 years old. Performed at [facility], [address], [place], [place] [address]      Time coordinating discharge: 35 minutes  SIGNED:   Rodena Goldmann, DO [facility] 10/27/2021, 8:59 AM  If 7PM-7AM, please contact night-coverage [email or web address]

## 2021-10-27 NOTE — Progress Notes (Signed)
Pt DC is fair condition. This RN wheels her to main entrance and assisted her into vehicle. Daughter is driving her home. Bryson Corona Edd Fabian

## 2021-10-28 ENCOUNTER — Telehealth: Payer: Self-pay | Admitting: Gynecologic Oncology

## 2021-10-28 NOTE — Telephone Encounter (Signed)
Called and spoke with the patient.  She is doing okay after being discharged from the hospital.  She continues to have some hip pain.  Is very worried about her insurance.  She is lost insurance through [employer].  She is working on [employer] set up.  I strongly encouraged her to call the cancer center to speak with one of the social workers tomorrow.  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  Given metastatic findings on imaging, I discussed with her my recommendation for consideration of systemic chemotherapy to see if we can shrink her disease.  If she were to have some response, I think future surgery could be considered for palliation.  I will reach out to her medical oncologist again tomorrow.  Jeral Pinch MD Gynecologic Oncology

## 2021-10-29 ENCOUNTER — Encounter (HOSPITAL_COMMUNITY): Payer: Self-pay | Admitting: Hematology

## 2021-10-30 ENCOUNTER — Other Ambulatory Visit (HOSPITAL_COMMUNITY): Payer: Self-pay

## 2021-10-30 ENCOUNTER — Telehealth: Payer: Self-pay | Admitting: Pharmacy Technician

## 2021-10-30 ENCOUNTER — Inpatient Hospital Stay (HOSPITAL_BASED_OUTPATIENT_CLINIC_OR_DEPARTMENT_OTHER): Payer: 59 | Admitting: Hematology

## 2021-10-30 ENCOUNTER — Encounter (HOSPITAL_COMMUNITY): Payer: Self-pay | Admitting: Hematology

## 2021-10-30 VITALS — BP 99/62 | HR 124 | Temp 101.9°F | Resp 18

## 2021-10-30 DIAGNOSIS — R112 Nausea with vomiting, unspecified: Secondary | ICD-10-CM | POA: Diagnosis not present

## 2021-10-30 DIAGNOSIS — C541 Malignant neoplasm of endometrium: Secondary | ICD-10-CM | POA: Diagnosis not present

## 2021-10-30 MED ORDER — LENVATINIB (14 MG DAILY DOSE) 10 & 4 MG PO CPPK
14.0000 mg | ORAL_CAPSULE | Freq: Every day | ORAL | 3 refills | Status: DC
  Filled 2021-10-30: qty 60, 60d supply, fill #0

## 2021-10-30 NOTE — Progress Notes (Signed)
DISCONTINUE ON PATHWAY REGIMEN - Uterine     A cycle is every 21 days:     Paclitaxel      Carboplatin   **Always confirm dose/schedule in your pharmacy ordering system**  REASON: Other Reason PRIOR TREATMENT: [id number]: Carboplatin AUC=6 + Paclitaxel 175 mg/m2 q21 Days x 6 Cycles TREATMENT RESPONSE: N/A - Adjuvant Therapy    Patient Characteristics: Carcinosarcoma, Recurrent/Progressive Disease, Second Line Histology: Carcinosarcoma Therapeutic Status: Recurrent or Progressive Disease Line of Therapy: Second Line Time to Recurrence: Relapse ? 12 Months From Prior Therapy

## 2021-10-30 NOTE — Patient Instructions (Signed)
[place] at [facility] Discharge Instructions  You were seen and examined today by Dr. Delton Coombes.  Dr. Delton Coombes discussed your most recent lab work and CT scan which revealed progression of cancer.   Dr. Delton Coombes has recommended starting a new treatment regimen. This will be a combination of chemotherapy pills and immunotherapy. Chemotherapy pills will be taken at home, they will be delivered to your house by a specialty pharmacy. Immunotherapy is given once every 3 weeks here in the clinic.  Follow-up as scheduled.  Thank you for choosing [place] at [facility] to provide your oncology and hematology care.  To afford each patient quality time with our provider, please arrive at least 15 minutes before your scheduled appointment time.   If you have a lab appointment with the [facility] please come in thru the Main Entrance and check in at the main information desk.  You need to re-schedule your appointment should you arrive 10 or more minutes late.  We strive to give you quality time with our providers, and arriving late affects you and other patients whose appointments are after yours.  Also, if you no show three or more times for appointments you may be dismissed from the clinic at the providers discretion.     Again, thank you for choosing [facility].  Our hope is that these requests will decrease the amount of time that you wait before being seen by our physicians.       _____________________________________________________________  Should you have questions after your visit to [facility], please contact our office at [phone] and follow the prompts.  Our office hours are 8:00 a.m. and 4:30 p.m. Monday - Friday.  Please note that voicemails left after 4:00 p.m. may not be returned until the following business day.  We are closed weekends and major holidays.  You do have access to a nurse 24-7, just  call the main number to the clinic [phone] and do not press any options, hold on the line and a nurse will answer the phone.    For prescription refill requests, have your pharmacy contact our office and allow 72 hours.

## 2021-10-30 NOTE — Progress Notes (Signed)
ON PATHWAY REGIMEN - Uterine  No Change  Continue With Treatment as Ordered.  Original Decision Date/Time: 12/31/2020 14:55     A cycle is every 21 days:     Paclitaxel      Carboplatin   **Always confirm dose/schedule in your pharmacy ordering system**  Patient Characteristics: Carcinosarcoma, Newly Diagnosed, Postoperative (Pathologic Staging), Postoperative Histology: Carcinosarcoma Therapeutic Status: Newly Diagnosed, Postoperative (Pathologic Staging) AJCC M Category: cM0 AJCC 8 Stage Grouping: IIIC1 AJCC T Category: pT1a AJCC N Category: [id number] Intent of Therapy: Curative Intent, Discussed with Patient

## 2021-10-30 NOTE — Progress Notes (Signed)
START ON PATHWAY REGIMEN - Uterine     A cycle is every 21 days:     Lenvatinib      Pembrolizumab   **Always confirm dose/schedule in your pharmacy ordering system**  Patient Characteristics: Carcinosarcoma, Recurrent/Progressive Disease, Second Line, Relapse < 12 Months From Prior Therapy Histology: Carcinosarcoma Therapeutic Status: Recurrent or Progressive Disease Line of Therapy: Second Line Time to Recurrence: Relapse < 12 Months From Prior Therapy Intent of Therapy: Non-Curative / Palliative Intent, Discussed with Patient

## 2021-10-30 NOTE — Telephone Encounter (Signed)
Oral Oncology Patient Advocate Encounter   Received notification that prior authorization for Lenvima is required.   PA submitted on 10/30/2021 [place] [id number]  Status is pending     Lady Deutscher, CPhT-Adv Pharmacy Patient Advocate Specialist [place] Patient Advocate Team Direct Number: [phone]  Fax: [phone]

## 2021-10-30 NOTE — Progress Notes (Signed)
[place], [place] [address]   CLINIC:  Medical Oncology/Hematology  PCP:  Pcp, No None None   REASON FOR VISIT:  Follow-up for endometrial cancer  PRIOR THERAPY: Carboplatin and paclitaxel every 3 weeks for 6 cycles  NGS Results: not done  CURRENT THERAPY: Lenvatinib and pembrolizumab  BRIEF ONCOLOGIC HISTORY:  Oncology History  Endometrial cancer ([place])  11/15/2020 Initial Diagnosis   Endometrial cancer ([place])   01/23/2021 -  Chemotherapy   Patient is on Treatment Plan : UTERINE Carboplatin AUC 6 / Paclitaxel q21d       CANCER STAGING: Cancer Staging  Endometrial cancer [facility]) Staging form: Corpus Uteri - Carcinoma and Carcinosarcoma, AJCC 8th Edition - Clinical stage from 12/31/2020: FIGO Stage IIIC1 (cT1a, cN69m, cM0) - Unsigned   INTERVAL HISTORY:  Michele Romero a 61y.o. female, returns for routine follow-up of her endometrial cancer. [name] last seen on 10/16/2021.   Today she reports feeling poorly. She reports constant RLQ and LLQ abdominal pain which has not been helped by tylenol. She reports dysuria. She denies constipation and reports 1-2 BM daily. She has no  appetite. She denies history of autoimmune issues.  REVIEW OF SYSTEMS:  Review of Systems  Constitutional:  Positive for appetite change, fatigue and fever.  Gastrointestinal:  Positive for abdominal pain (6/10), nausea and vomiting. Negative for constipation.  Genitourinary:  Positive for dysuria.   Musculoskeletal:  Positive for arthralgias (6/10 legs).  All other systems reviewed and are negative.   PAST MEDICAL/SURGICAL HISTORY:  Past Medical History:  Diagnosis Date   Ambulates with cane 10/31/2020   Arthritis    RIGHT HIP   Asthma    Cigarette nicotine dependence without complication 027/25/3664  Complex tear of medial meniscus of right knee 01/20/2017   COPD (chronic obstructive pulmonary disease) ([place]    ENDOMETRIAL CANCER  10/31/2020   Endometrial cancer ([place] 11/15/2020   GERD (gastroesophageal reflux disease)    History of radiation therapy    HDR brachytherapy [facility]1/25/2023-06/03/2021  Dr JGery Pray  Migraines    Port-A-Cath in place 01/01/2021   Past Surgical History:  Procedure Laterality Date   APPENDECTOMY     YRS AGO PER PT ON 10-31-2020   BIOPSY  11/29/2019   Procedure: BIOPSY;  Surgeon: CHarvel Quale MD;  [facility];  Service: Gastroenterology;;  ileocecal vlve   CHOLECYSTECTOMY     YRS AGO PER PT ON 10-31-2020   COLONOSCOPY WITH PROPOFOL N/A 11/29/2019   Procedure: COLONOSCOPY WITH PROPOFOL;  Surgeon: CHarvel Quale MD;  Location[facility];  Service: Gastroenterology;  Laterality: N/A;  930   HYSTEROSCOPY WITH D & C N/A 10/10/2020   Procedure: DILATATION AND CURETTAGE /HYSTEROSCOPY;  Surgeon: EFlorian Buff MD;  Location: [facility];  Service: Gynecology;  Laterality: N/A;   IR IMAGING GUIDED PORT INSERTION  01/08/2021   KNEE ARTHROPLASTY  2018   left knee   POLYPECTOMY  11/29/2019   Procedure: POLYPECTOMY;  Surgeon: CMontez Morita DQuillian Quince MD;  [facility];  Service: Gastroenterology;;  sigmoid colon    ROBOTIC ASSISTED TOTAL HYSTERECTOMY WITH BILATERAL SALPINGO OOPHERECTOMY N/A 11/15/2020   Procedure: XI ROBOTIC ASSISTED TOTAL HYSTERECTOMY GREATER THAN TWO HUNDRED AND FIFTY GRAMS WITH BILATERAL SALPINGO OOPHORECTOMY, MINI LAPAROTOMY;  Surgeon: REveritt Amber MD;  [facility]  Service: Gynecology;  Laterality: N/A;   SENTINEL NODE BIOPSY N/A 11/15/2020   Procedure: SENTINEL NODE  BIOPSY;  Surgeon: Everitt Amber, MD;  Location: [facility];  Service: Gynecology;  Laterality: N/A;   TUBAL LIGATION  04/14/1985    SOCIAL HISTORY:  Social History   Socioeconomic History   Marital status: Single    Spouse name: Not on file   Number of children: 2   Years of education: Not on file   Highest education  level: High school graduate  Occupational History   Not on file  Tobacco Use   Smoking status: Former    Packs/day: 0.50    Years: 30.00    Total pack years: 15.00    Types: Cigarettes    Quit date: 01/01/2020    Years since quitting: 1.8    Passive exposure: Never   Smokeless tobacco: Never  Vaping Use   Vaping Use: Never used  Substance and Sexual Activity   Alcohol use: No   Drug use: Yes    Frequency: 6.0 times per week    Types: Marijuana    Comment: every now and then   Sexual activity: Not Currently    Birth control/protection: Surgical    Comment: tubal  Other Topics Concern   Not on file  Social History Narrative   Lives with daughter and 1 grandbaby   Son is in [place]      Enjoys: sitting outside, music, tv      Diet: eats all food groups   Caffeine: soda and tea: 2-3 cups daily   Water: 2-3 16 oz bottles      Wears seat belt    Does not use phone while driving   Smoke detectors at home    No weapons    Social Determinants of Health   Financial Resource Strain: High Risk (01/22/2021)   Overall Financial Resource Strain (CARDIA)    Difficulty of Paying Living Expenses: Hard  Food Insecurity: No Food Insecurity (01/22/2021)   Hunger Vital Sign    Worried About Running Out of Food in the Last Year: Never true    [place] in the Last Year: Never true  Transportation Needs: Unmet Transportation Needs (01/22/2021)   PRAPARE - Transportation    Lack of Transportation (Medical): Yes    Lack of Transportation (Non-Medical): Yes  Physical Activity: Inactive (10/28/2019)   Exercise Vital Sign    Days of Exercise per Week: 0 days    Minutes of Exercise per Session: 0 min  Stress: Stress Concern Present (01/22/2021)   [place]    Feeling of Stress : To some extent  Social Connections: Unknown (01/22/2021)   Social Connection and Isolation Panel [NHANES]    Frequency of Communication with  Friends and Family: More than three times a week    Frequency of Social Gatherings with Friends and Family: More than three times a week    Attends Religious Services: More than 4 times per year    Active Member of [employer] or Organizations: Not on file    Attends Archivist Meetings: Not on file    Marital Status: Not on file  Intimate Partner Violence: Not At Risk (10/28/2019)   Humiliation, Afraid, Rape, and Kick questionnaire    Fear of Current or Ex-Partner: No    Emotionally Abused: No    Physically Abused: No    Sexually Abused: No    FAMILY HISTORY:  Family History  Problem Relation Age of Onset   Diabetes Mother    Hypertension Mother  Heart disease Mother        CHF   Stroke Mother    Heart disease Father    Colon cancer Brother    Breast cancer Neg Hx    Ovarian cancer Neg Hx    Endometrial cancer Neg Hx    Prostate cancer Neg Hx    Pancreatic cancer Neg Hx     CURRENT MEDICATIONS:  Current Outpatient Medications  Medication Sig Dispense Refill   acetaminophen (TYLENOL) 500 MG tablet Take 1,000 mg by mouth every 6 (six) hours as needed for moderate pain.     albuterol (VENTOLIN HFA) 108 (90 Base) MCG/ACT inhaler Inhale 2 puffs into the lungs every 6 (six) hours as needed for wheezing or shortness of breath. 8 g 2   budesonide-formoterol (SYMBICORT) 80-4.5 MCG/ACT inhaler Inhale 2 puffs into the lungs 2 (two) times daily. 10.2 g 2   gabapentin (NEURONTIN) 300 MG capsule Take 1 capsule (300 mg total) by mouth 3 (three) times daily. Take additional capsule at bed time for total dose 622m 120 capsule 6   loperamide (IMODIUM) 2 MG capsule Take 1 capsule (2 mg total) by mouth as needed for diarrhea or loose stools (Take 2 Capsules after the 1st loose stool and then 1 capsule adter each loose stool. Do not exceed 8 capsules in a 24-hr period. If it is bedtime and you are having loose stools, take 2 capsules at bestime and then take 2 capsules every 4 hours until  morning.). 30 capsule 0   magnesium oxide (MAG-OX) 400 (240 Mg) MG tablet Take 1 tablet (400 mg total) by mouth in the morning, at noon, and at bedtime. 90 tablet 6   Multiple Vitamin (MULTIVITAMIN WITH MINERALS) TABS tablet Take 1 tablet by mouth daily.     potassium chloride SA (KLOR-CON M) 20 MEQ tablet Take 1 tablet (20 mEq total) by mouth daily. 30 tablet 6   vitamin B-12 (CYANOCOBALAMIN) 500 MCG tablet Take 500 mcg by mouth daily.     No current facility-administered medications for this visit.    ALLERGIES:  Allergies  Allergen Reactions   Ivp Dye [Iodinated Contrast Media] Nausea And Vomiting    Patient had an episode of projectile vomiting after IV injection. Patient states it happens every time she has IV contrast.     Codeine Nausea And Vomiting and Rash    PHYSICAL EXAM:  Performance status (ECOG): 1 - Symptomatic but completely ambulatory  There were no vitals filed for this visit. Wt Readings from Last 3 Encounters:  10/27/21 190 lb 7.6 oz (86.4 kg)  10/09/21 179 lb (81.2 kg)  09/17/21 179 lb (81.2 kg)   Physical Exam Vitals reviewed.  Constitutional:      Appearance: Normal appearance. She is obese.     Comments: In wheelchair  Cardiovascular:     Rate and Rhythm: Normal rate and regular rhythm.     Pulses: Normal pulses.     Heart sounds: Normal heart sounds.  Pulmonary:     Effort: Pulmonary effort is normal.     Breath sounds: Normal breath sounds.  Neurological:     General: No focal deficit present.     Mental Status: She is alert and oriented to person, place, and time.  Psychiatric:        Mood and Affect: Mood normal.        Behavior: Behavior normal.      LABORATORY DATA:  I have reviewed the labs as listed.  Latest Ref Rng & Units 10/27/2021    4:20 AM 10/26/2021    7:42 PM 10/26/2021    5:56 PM  CBC  WBC 4.0 - 10.5 K/uL 10.5     Hemoglobin 12.0 - 15.0 g/dL 8.9  9.0  QUESTIONABLE RESULTS, RECOMMEND RECOLLECT TO VERIFY  C  Hematocrit  36.0 - 46.0 % 27.1  27.1  QUESTIONABLE RESULTS, RECOMMEND RECOLLECT TO VERIFY  C  Platelets 150 - 400 K/uL 256       C Corrected result      Latest Ref Rng & Units 10/27/2021    4:20 AM 10/26/2021    3:58 AM 10/25/2021    1:54 PM  CMP  Glucose 70 - 99 mg/dL 125  119  120   BUN 8 - 23 mg/dL 7  8  10    Creatinine 0.44 - 1.00 mg/dL 0.62  0.64  0.74   Sodium 135 - 145 mmol/L 136  134  134   Potassium 3.5 - 5.1 mmol/L 4.0  3.6  3.9   Chloride 98 - 111 mmol/L 104  105  101   CO2 22 - 32 mmol/L 22  23  22    Calcium 8.9 - 10.3 mg/dL 8.3  7.8  8.6   Total Protein 6.5 - 8.1 g/dL  5.8  7.3   Total Bilirubin 0.3 - 1.2 mg/dL  0.5  0.7   Alkaline Phos 38 - 126 U/L  43  51   AST 15 - 41 U/L  17  21   ALT 0 - 44 U/L  8  8     DIAGNOSTIC IMAGING:  I have independently reviewed the scans and discussed with the patient. US Venous Img Lower Unilateral Right (DVT)  Result Date: 10/26/2021 CLINICAL DATA:  Right lower extremity edema. EXAM: RIGHT LOWER EXTREMITY VENOUS DOPPLER ULTRASOUND TECHNIQUE: Gray-scale sonography with graded compression, as well as color Doppler and duplex ultrasound were performed to evaluate the lower extremity deep venous systems from the level of the common femoral vein and including the common femoral, femoral, profunda femoral, popliteal and calf veins including the posterior tibial, peroneal and gastrocnemius veins when visible. The superficial great saphenous vein was also interrogated. Spectral Doppler was utilized to evaluate flow at rest and with distal augmentation maneuvers in the common femoral, femoral and popliteal veins. COMPARISON:  None Available. FINDINGS: Contralateral Common Femoral Vein: Respiratory phasicity is normal and symmetric with the symptomatic side. No evidence of thrombus. Normal compressibility. Common Femoral Vein: No evidence of thrombus. Normal compressibility, respiratory phasicity and response to augmentation. Saphenofemoral Junction: Thrombus in  the upper segment of the great saphenous vein extends to the saphenofemoral junction and just into the common femoral vein but does not cause any flow restriction or luminal stenosis of the common femoral vein. Profunda Femoral Vein: No evidence of thrombus. Normal compressibility and flow on color Doppler imaging. Femoral Vein: No evidence of thrombus. Normal compressibility, respiratory phasicity and response to augmentation. Popliteal Vein: No evidence of thrombus. Normal compressibility, respiratory phasicity and response to augmentation. Calf Veins: No evidence of thrombus. Normal compressibility and flow on color Doppler imaging. Superficial Great Saphenous Vein: Thrombus in the upper thigh segment of the great saphenous vein. More distal segments appear normally patent. Venous Reflux:  None. Other Findings:  No abnormal fluid collections. IMPRESSION: Superficial thrombophlebitis of the upper thigh segment of the right great saphenous vein with thrombus just extending across the saphenofemoral junction into the common femoral vein but not causing any flow restriction  or luminal stenosis in the common femoral vein. Electronically Signed   By: Aletta Edouard M.D.   On: 10/26/2021 16:27   DG Abd 1 View  Result Date: 10/26/2021 CLINICAL DATA:  Abdominal pain and pelvic mass. EXAM: ABDOMEN - 1 VIEW COMPARISON:  10/25/2021 FINDINGS: No dilated bowel loops are noted. Nondistended gas-filled loops of small bowel and colon are noted. Increased density in the pelvis is noted displacing bowel loop superiorly, compatible with large pelvic mass identified on recent CT. No acute bony abnormalities are identified. Severe degenerative changes in the RIGHT hip again noted. IMPRESSION: 1. Increased density in the pelvis compatible with large pelvic mass identified on recent CT. No evidence of bowel obstruction on this study. Electronically Signed   By: Margarette Canada M.D.   On: 10/26/2021 08:44   CT Angio Chest/Abd/Pel for  Dissection W and/or W/WO  Result Date: 10/25/2021 CLINICAL DATA:  Chest or back pain, aortic dissection suspected. History of endometrial cancer. * Tracking Code: BO * EXAM: CT ANGIOGRAPHY CHEST, ABDOMEN AND PELVIS TECHNIQUE: Non-contrast CT of the chest was initially obtained. Multidetector CT imaging through the chest, abdomen and pelvis was performed using the standard protocol during bolus administration of intravenous contrast. Multiplanar reconstructed images and MIPs were obtained and reviewed to evaluate the vascular anatomy. RADIATION DOSE REDUCTION: This exam was performed according to the departmental dose-optimization program which includes automated exposure control, adjustment of the mA and/or kV according to patient size and/or use of iterative reconstruction technique. CONTRAST:  81m OMNIPAQUE IOHEXOL 350 MG/ML SOLN COMPARISON:  Multiple prior including CTs October 09, 2021 September 17, 2021 and July 02, 2021. FINDINGS: CTA CHEST FINDINGS Cardiovascular: Noncontrast sequence demonstrates aortic atherosclerosis without intramural hematoma. Accessed right chest Port-A-Cath with tip near the superior cavoatrial junction. Preferential opacification of the thoracic aorta. No evidence of thoracic aortic aneurysm or dissection. No central pulmonary embolus on this nondedicated study. Normal heart size. No significant pericardial effusion/thickening. Mediastinum/Nodes: Pathologically enlarged mediastinal, hilar or axillary lymph nodes. Small hiatal hernia. Lungs/Pleura: No suspicious pulmonary nodules or masses. No focal airspace consolidation. Pleural effusion. No pneumothorax. Musculoskeletal: Thoracic spondylosis. No suspicious chest wall mass. Review of the MIP images confirms the above findings. CTA ABDOMEN AND PELVIS FINDINGS VASCULAR Aorta: Normal caliber aorta without aneurysm, dissection, vasculitis or significant stenosis. Celiac: Patent without evidence of aneurysm, dissection, vasculitis or  significant stenosis. SMA: Patent without evidence of aneurysm, dissection, vasculitis or significant stenosis. Renals: Both renal arteries are patent without evidence of aneurysm, dissection, vasculitis, fibromuscular dysplasia or significant stenosis. IMA: Patent without evidence of aneurysm, dissection, vasculitis or significant stenosis. Inflow: Patent without evidence of aneurysm, dissection, vasculitis or significant stenosis. Veins: No obvious venous abnormality within the limitations of this arterial phase study. Review of the MIP images confirms the above findings. NON-VASCULAR Hepatobiliary: No suspicious hepatic lesion. Gallbladder surgically absent. No biliary ductal dilation. Pancreas: No pancreatic ductal dilation or evidence of acute inflammation. Spleen: No splenomegaly or focal splenic lesion. Adrenals/Urinary Tract: Bilateral adrenal glands appear normal. Prominence of the right renal collecting system and proximal ureter to the level of the large pelvic mass with delayed right renal enhancement. Left kidney is unremarkable. No suspicious renal mass. Urinary bladder is minimally distended limiting evaluation. Stomach/Bowel: No radiopaque enteric contrast material was administered. Small hiatal hernia otherwise the stomach is unremarkable for degree of distension. Effacement and multifocal narrowing of large and small bowel loops by the large pelvic mass. Prominent loops of small bowel in the right hemiabdomen with interposed  effacement and multifocal narrowing of small-bowel loops Lymphatic: Enlarged iliac side chain and inguinal lymph nodes. For reference a left inguinal lymph node measures 11 mm in short axis on image 185/6 previously 6 mm in short axis, and a left external iliac lymph node measures 9 mm in short axis on image 165/6 previously 7 mm in short axis. No abdominal adenopathy. Reproductive: Prior hysterectomy. There is a large heterogeneous mass extending from the vaginal cuff into the  abdomen which appears to demonstrate nodular areas postcontrast enhancement and measures 17.3 x 14.7 x 24.1 cm on images 151/6 and 109/11. The adnexa are obscured by the large pelvic mass. Other: Trace right-sided abdominopelvic free fluid with possible nodular peritoneal thickening. Nonspecific subcutaneous edema. Musculoskeletal: Multilevel degenerative change of the spine. No aggressive lytic or blastic lesion of bone. Severe right hip degenerative change Review of the MIP images confirms the above findings. IMPRESSION: 1. 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 2. Increased size of prominent/mildly enlarged iliac side chain and inguinal lymph nodes which are suspicious for disease involvement. 3. Prominent loops of small bowel in the right hemiabdomen with interposed loops of nondilated bowel, no upstream dilation of small bowel or discrete abrupt transition to decompressed bowel, favored to be reactive or reflect focal enteritis. However an early/partial small bowel obstruction related to the large pelvic mass can not be excluded, recommend close clinical interval follow-up. 4. Right-sided obstructive uropathy related to the large pelvic mass. Consider correlation with laboratory values to exclude superimposed infection. 5. Trace right-sided abdominopelvic free fluid possible nodular peritoneal thickening but poorly evaluated on this examination. These results were called by telephone at the time of interpretation on 10/25/2021 at 2:59 pm to provider Dr. Langston Masker, who verbally acknowledged these results. Electronically Signed   By: Dahlia Bailiff M.D.   On: 10/25/2021 15:30   CT Head Wo Contrast  Result Date: 10/25/2021 CLINICAL DATA:  Neuro deficit EXAM: CT HEAD WITHOUT CONTRAST TECHNIQUE: Contiguous axial images were obtained from the base of the skull through the vertex  without intravenous contrast. RADIATION DOSE REDUCTION: This exam was performed according to the departmental dose-optimization program which includes automated exposure control, adjustment of the mA and/or kV according to patient size and/or use of iterative reconstruction technique. COMPARISON:  Head CT dated July 27, 2014 FINDINGS: Brain: No evidence of acute infarction, hemorrhage, hydrocephalus, extra-axial collection or mass lesion/mass effect. Vascular: No hyperdense vessel or unexpected calcification. Skull: Normal. Negative for fracture or focal lesion. Sinuses/Orbits: No acute finding. Other: None. IMPRESSION: No acute intracranial abnormality. Electronically Signed   By: Yetta Glassman M.D.   On: 10/25/2021 14:42   CT Angio Chest PE W and/or Wo Contrast  Result Date: 10/09/2021 CLINICAL DATA:  Shortness of breath and chest pain EXAM: CT ANGIOGRAPHY CHEST WITH CONTRAST TECHNIQUE: Multidetector CT imaging of the chest was performed using the standard protocol during bolus administration of intravenous contrast. Multiplanar CT image reconstructions and MIPs were obtained to evaluate the vascular anatomy. RADIATION DOSE REDUCTION: This exam was performed according to the departmental dose-optimization program which includes automated exposure control, adjustment of the mA and/or kV according to patient size and/or use of iterative reconstruction technique. CONTRAST:  175m OMNIPAQUE IOHEXOL 350 MG/ML SOLN COMPARISON:  CT 09/17/2021 FINDINGS: Cardiovascular: Satisfactory opacification of the pulmonary arteries to the segmental level. No evidence of pulmonary embolism. Normal heart size. No pericardial effusion. Nonaneurysmal  aorta. No dissection. Mild atherosclerosis. Right-sided central venous catheter tip at the cavoatrial region. Normal cardiac size. No pericardial effusion Mediastinum/Nodes: No enlarged mediastinal, hilar, or axillary lymph nodes. Thyroid gland, trachea, and esophagus demonstrate no  significant findings. Lungs/Pleura: Lungs are clear. No pleural effusion or pneumothorax. Upper Abdomen: No acute abnormality. Musculoskeletal: No chest wall abnormality. No acute or significant osseous findings. Review of the MIP images confirms the above findings. IMPRESSION: 1. No CT evidence for acute pulmonary embolus or aortic dissection. 2. Clear lung fields Aortic Atherosclerosis (ICD10-I70.0). Electronically Signed   By: Donavan Foil M.D.   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 - Endometrial biopsy on 10/10/2020 consistent with carcinosarcoma, MMR preserved. - CT scan of the abdomen and pelvis with contrast on 10/29/2020 with endometrial mass, no compelling findings of metastatic disease in the abdomen or pelvis.  30 cm fatty lesion intraluminally in the distal transverse colon probably lipoma. - Robotic assisted laparoscopic total hysterectomy with bilateral salpingo-oophorectomy and sentinel lymph node biopsy by Dr. Denman George on 11/15/2020. - Pathology consistent with carcinosarcoma spanning 8.5 cm, tumor invades less than one half of myometrium, LVI positive, 1 lymph node involved with micrometastasis, PT1APN1 MI, MMR preserved, MSI-stable - She was evaluated by Dr. Denman George and 6 cycles of adjuvant chemotherapy with carboplatin and paclitaxel with vaginal brachytherapy was recommended. -Carboplatin and paclitaxel from 01/23/2021, paclitaxel discontinued during cycle 5 on 6 secondary to severe neuropathy.  Last cycle of carboplatin on 06/10/2021 - CT CAP on 10/25/2021: Large heterogeneous mass extending from the vaginal cuff into the mid abdomen measuring 24.1 x 17.3 x 14.7 cm.  Increased size of prominent/mildly enlarged iliac side chain and inguinal lymph nodes.  Right-sided obstructive uropathy related to large pelvic mass. - Lenvatinib 14 mg and pembrolizumab started on  2.  Social/family  history: - Lives at home with her daughter and granddaughter.  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  She quit smoking in September 2021.  Half pack per day smoker for 30 years.  She has severe right hip arthritis, requiring her to use a cane for ambulation. - No family history of malignancies.   PLAN:  Recurrent endometrial carcinosarcoma: -Reviewed CT CAP from 10/25/2021. - Unfortunately this patient had a rapid recurrence of carcinosarcoma. - I have talked to Dr. Berline Lopes.  She is not a candidate for surgery at this time. - She had recurrence within 6 months of completing carboplatin. - Recommend combination lenvatinib and pembrolizumab. - We will start her on lenvatinib 14 mg daily and increase it to 20 mg daily if she tolerates well. - We discussed side effects in detail.  Literature was given. - We I have also discussed pembrolizumab given every [redacted] weeks along with lenvatinib.  We discussed immunotherapy related side effects. - I will follow-up with her 2 weeks after starting lenvatinib. - We will order home health consult for help with day-to-day activities at home.  2.  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  Transfuse as needed.  3.  Hypomagnesemia: - Continue magnesium 3 times daily.  4.  Hypokalemia: - Continue potassium 20 mg daily.  5.  Peripheral neuropathy: - Continue gabapentin 300 mg-300 mg - 600 mg daily.  6.  Abdominal pain: - She has right lower quadrant and left lower quadrant pain which is constant.  Malignancy related. -  We will start her on hydrocodone 5/325 every 6 hours as needed #60.   Orders placed this encounter:  No orders of the defined types were placed in this encounter.    Derek Jack, MD [place] [phone]   I, Thana Ates, am acting as a scribe for Dr. Derek Jack.  I, Derek Jack MD, have reviewed the above  documentation for accuracy and completeness, and I agree with the above.

## 2021-10-31 ENCOUNTER — Telehealth (HOSPITAL_COMMUNITY): Payer: Self-pay | Admitting: Pharmacist

## 2021-10-31 ENCOUNTER — Other Ambulatory Visit (HOSPITAL_COMMUNITY): Payer: Self-pay | Admitting: *Deleted

## 2021-10-31 ENCOUNTER — Other Ambulatory Visit (HOSPITAL_COMMUNITY): Payer: Self-pay

## 2021-10-31 ENCOUNTER — Telehealth: Payer: Self-pay | Admitting: Pharmacy Technician

## 2021-10-31 ENCOUNTER — Encounter (HOSPITAL_COMMUNITY): Payer: Self-pay | Admitting: Hematology

## 2021-10-31 DIAGNOSIS — C541 Malignant neoplasm of endometrium: Secondary | ICD-10-CM

## 2021-10-31 DIAGNOSIS — R634 Abnormal weight loss: Secondary | ICD-10-CM

## 2021-10-31 LAB — CULTURE, BLOOD (ROUTINE X 2)
Culture: NO GROWTH
Culture: NO GROWTH
Special Requests: ADEQUATE

## 2021-10-31 MED ORDER — HYDROCODONE-ACETAMINOPHEN 5-325 MG PO TABS: 1.0000 | ORAL_TABLET | Freq: Four times a day (QID) | ORAL | 0 refills | Status: DC | PRN

## 2021-10-31 MED ORDER — HYDROCODONE-ACETAMINOPHEN 5-325 MG PO TABS
1.0000 | ORAL_TABLET | Freq: Four times a day (QID) | ORAL | 0 refills | Status: DC | PRN
Start: 2021-10-31 — End: 2021-11-05
  Filled 2021-10-31: qty 60, 15d supply, fill #0

## 2021-10-31 MED ORDER — LENVATINIB (14 MG DAILY DOSE) 10 & 4 MG PO CPPK: 14.0000 mg | ORAL_CAPSULE | Freq: Every day | ORAL | 3 refills | Status: DC

## 2021-10-31 NOTE — Progress Notes (Signed)
Pharmacist Chemotherapy Monitoring - Initial Assessment    Anticipated start date: 11/07/21   The following has been reviewed per standard work regarding the patient's treatment regimen: The patient's diagnosis, treatment plan and drug doses, and organ/hematologic function Lab orders and baseline tests specific to treatment regimen  The treatment plan start date, drug sequencing, and pre-medications Prior authorization status  Patient's documented medication list, including drug-drug interaction screen and prescriptions for anti-emetics and supportive care specific to the treatment regimen The drug concentrations, fluid compatibility, administration routes, and timing of the medications to be used The patient's access for treatment and lifetime cumulative dose history, if applicable  The patient's medication allergies and previous infusion related reactions, if applicable   Changes made to treatment plan:  treatment plan date  Follow up needed:  Pending authorization for treatment    Wynona Neat, [facility], 10/31/2021  12:29 PM

## 2021-10-31 NOTE — Addendum Note (Signed)
Addended by: Derek Jack on: 10/31/2021 09:14 AM   Modules accepted: Orders

## 2021-10-31 NOTE — Telephone Encounter (Signed)
Oral Oncology Patient Advocate Encounter  Prior Authorization for Michele Romero has been approved.    PA# [id number]  Effective dates: 10/31/2021 through 11/01/2022  Patient must fill at [facility].    Lady Deutscher, CPhT-Adv Pharmacy Patient Advocate Specialist [place] Patient Advocate Team Direct Number: [phone]  Fax: [phone]

## 2021-10-31 NOTE — Progress Notes (Signed)
Per Romualdo Bolk, RN with [facility].  They will see patient fo assess for needs PT,OT, Nursing and Social Work.

## 2021-10-31 NOTE — Telephone Encounter (Signed)
Oral Oncology Pharmacist Encounter  Received new prescription for Lenvima (lenvatinib) for the treatment of recurrent endometrial cancer in conjunction with pembrolizumab, planned duration until disease progression or unacceptable drug toxicity.  CMP from 10/26/21 assessed, no relevant lab abnormalities. Prescription dose and frequency assessed. MD plan to start her on lenvatinib 14 mg daily and increase it to 20 mg daily if tolerated.  Current medication list in Epic reviewed, no relevant DDIs with lenvatinib identified.  Evaluated chart and no patient barriers to medication adherence identified.   Prescription has been e-scribed to the [facility] for benefits analysis and approval.  Oral Oncology Clinic will continue to follow for insurance authorization, copayment issues, initial counseling and start date.   [name], PharmD, BCPS, BCOP, CPP Hematology/Oncology Clinical Pharmacist Practitioner [place]/DB/AP Oral [facility] [phone]  10/31/2021 8:16 AM

## 2021-10-31 NOTE — Telephone Encounter (Signed)
Oral Chemotherapy Pharmacist Encounter  Successfully signed the patient up for a copay card for Lenvima.  ID: [id number] BIN: [id number] Group: [id number]  Billing information was faxed to [place]. I will place a copy of the card to be scanned into patient's chart.

## 2021-10-31 NOTE — Telephone Encounter (Signed)
Oral Oncology Patient Advocate Encounter   Was successful in obtaining a copay card for Michele Romero.  This copay card will make the patients copay as little as $0.   The billing information is as follows and has been shared with [facility].   RxBin: [id number]  Member ID: [id number] Group ID: [id number]   Lady Deutscher, CPhT-Adv Pharmacy Patient Advocate Specialist [place] Patient Advocate Team Direct Number: [phone]  Fax: [phone]

## 2021-11-01 ENCOUNTER — Encounter (INDEPENDENT_AMBULATORY_CARE_PROVIDER_SITE_OTHER): Payer: Self-pay | Admitting: *Deleted

## 2021-11-04 ENCOUNTER — Other Ambulatory Visit: Payer: Self-pay

## 2021-11-05 ENCOUNTER — Other Ambulatory Visit (HOSPITAL_COMMUNITY): Payer: Self-pay

## 2021-11-05 ENCOUNTER — Other Ambulatory Visit (HOSPITAL_COMMUNITY): Payer: Self-pay | Admitting: *Deleted

## 2021-11-05 ENCOUNTER — Telehealth (HOSPITAL_COMMUNITY): Payer: Self-pay | Admitting: *Deleted

## 2021-11-05 MED ORDER — HYDROCODONE-ACETAMINOPHEN 5-325 MG PO TABS: 1.0000 | ORAL_TABLET | Freq: Four times a day (QID) | ORAL | 0 refills | Status: DC | PRN

## 2021-11-05 NOTE — Telephone Encounter (Signed)
Received call from [place] to advise that patient's HR 124 and is afebrile.  She is c/o 6/10 abdominal pain and has not received her pain medication from [facility].  Called pharmacy and has been filled, however was not set up for delivery.  Script cancelled and will send to [facility], per patient preference.  Spoke to patient to let her know plan for pain medication and make her aware that her HR has been in that range here in the office and is likely due to her current pain, as she has no other associated symptoms.  She has appointment with [place] on Thursday and will reassess at that time.

## 2021-11-06 ENCOUNTER — Other Ambulatory Visit (HOSPITAL_COMMUNITY): Payer: Self-pay

## 2021-11-06 NOTE — Telephone Encounter (Signed)
Oral Chemotherapy Pharmacist Encounter  Patient is required to use [facility] per her insurance requirement. She has not yet heard from [facility]. Called [facility] to check on the status, Rx is still pending, asked they mark it as urgent.  Patient Education I spoke with patient for overview of new oral chemotherapy medication: Lenvima (lenvatinib) for the treatment of recurrent endometrial cancer in conjunction with pembrolizumab, planned duration until disease progression or unacceptable drug toxicity.   Counseled patient on administration, dosing, side effects, monitoring, drug-food interactions, safe handling, storage, and disposal. Patient will take 14 mg by mouth daily.  Side effects include but not limited to: hypertension, diarrhea, nausea, mouth sores, hand-foot syndrome. Hypertension: patient reports having a blood pressure cuff at home, asked that she monitor her BP daily for the first month. Reviewed the s/sx of hypertension Diarrhea: patient reports having loperamide at home to use as needed. She knows to call the office if she is having 4 or more loose stools Mouth sores: patient knows to request magic monthwash if needed  Reviewed with patient importance of keeping a medication schedule and plan for any missed doses.  After discussion with patient no patient barriers to medication adherence identified.   Michele Romero voiced understanding and appreciation. All questions answered. Medication handout provided.  Provided patient with [facility] phone number. Patient knows to call the office with questions or concerns. Oral Chemotherapy Navigation Clinic will continue to follow.  [name], PharmD, BCPS, BCOP, CPP Hematology/Oncology Clinical Pharmacist Practitioner [place]/DB/AP Oral [facility] [phone]  11/06/2021 9:45 AM

## 2021-11-07 ENCOUNTER — Inpatient Hospital Stay (HOSPITAL_COMMUNITY): Payer: 59

## 2021-11-07 ENCOUNTER — Other Ambulatory Visit (HOSPITAL_COMMUNITY): Payer: Self-pay

## 2021-11-07 ENCOUNTER — Encounter (HOSPITAL_COMMUNITY): Payer: Self-pay

## 2021-11-07 ENCOUNTER — Other Ambulatory Visit (HOSPITAL_COMMUNITY): Payer: Self-pay | Admitting: *Deleted

## 2021-11-07 VITALS — BP 105/74 | HR 110 | Temp 98.0°F | Resp 20

## 2021-11-07 DIAGNOSIS — C541 Malignant neoplasm of endometrium: Secondary | ICD-10-CM

## 2021-11-07 DIAGNOSIS — Z95828 Presence of other vascular implants and grafts: Secondary | ICD-10-CM

## 2021-11-07 DIAGNOSIS — R112 Nausea with vomiting, unspecified: Secondary | ICD-10-CM | POA: Diagnosis not present

## 2021-11-07 LAB — CBC WITH DIFFERENTIAL/PLATELET
Abs Immature Granulocytes: 0.07 10*3/uL (ref 0.00–0.07)
Basophils Absolute: 0 10*3/uL (ref 0.0–0.1)
Basophils Relative: 0 %
Eosinophils Absolute: 0.1 10*3/uL (ref 0.0–0.5)
Eosinophils Relative: 0 %
HCT: 25.7 % — ABNORMAL LOW (ref 36.0–46.0)
Hemoglobin: 8.1 g/dL — ABNORMAL LOW (ref 12.0–15.0)
Immature Granulocytes: 1 %
Lymphocytes Relative: 12 %
Lymphs Abs: 1.3 10*3/uL (ref 0.7–4.0)
MCH: 28.3 pg (ref 26.0–34.0)
MCHC: 31.5 g/dL (ref 30.0–36.0)
MCV: 89.9 fL (ref 80.0–100.0)
Monocytes Absolute: 1.1 10*3/uL — ABNORMAL HIGH (ref 0.1–1.0)
Monocytes Relative: 9 %
Neutro Abs: 8.8 10*3/uL — ABNORMAL HIGH (ref 1.7–7.7)
Neutrophils Relative %: 78 %
Platelets: 252 10*3/uL (ref 150–400)
RBC: 2.86 MIL/uL — ABNORMAL LOW (ref 3.87–5.11)
RDW: 17.4 % — ABNORMAL HIGH (ref 11.5–15.5)
WBC: 11.3 10*3/uL — ABNORMAL HIGH (ref 4.0–10.5)
nRBC: 0 % (ref 0.0–0.2)

## 2021-11-07 LAB — TSH: TSH: 4.131 u[IU]/mL (ref 0.350–4.500)

## 2021-11-07 LAB — COMPREHENSIVE METABOLIC PANEL
ALT: 10 U/L (ref 0–44)
AST: 28 U/L (ref 15–41)
Albumin: 2.4 g/dL — ABNORMAL LOW (ref 3.5–5.0)
Alkaline Phosphatase: 56 U/L (ref 38–126)
Anion gap: 10 (ref 5–15)
BUN: 11 mg/dL (ref 8–23)
CO2: 22 mmol/L (ref 22–32)
Calcium: 8.3 mg/dL — ABNORMAL LOW (ref 8.9–10.3)
Chloride: 103 mmol/L (ref 98–111)
Creatinine, Ser: 0.81 mg/dL (ref 0.44–1.00)
GFR, Estimated: >60 mL/min (ref >60)
Glucose, Bld: 123 mg/dL — ABNORMAL HIGH (ref 70–99)
Potassium: 3.8 mmol/L (ref 3.5–5.1)
Sodium: 135 mmol/L (ref 135–145)
Total Bilirubin: 0.7 mg/dL (ref 0.3–1.2)
Total Protein: 6.7 g/dL (ref 6.5–8.1)

## 2021-11-07 LAB — MAGNESIUM: Magnesium: 1.8 mg/dL (ref 1.7–2.4)

## 2021-11-07 MED ORDER — MAGNESIUM SULFATE 2 GM/50ML IV SOLN
2.0000 g | Freq: Once | INTRAVENOUS | Status: AC
  Administered 2021-11-07: 2 g via INTRAVENOUS
  Filled 2021-11-07: qty 50

## 2021-11-07 MED ORDER — PROCHLORPERAZINE MALEATE 10 MG PO TABS
10.0000 mg | ORAL_TABLET | Freq: Four times a day (QID) | ORAL | 1 refills | Status: DC | PRN
  Filled 2021-11-07: qty 60, 15d supply, fill #0

## 2021-11-07 MED ORDER — PROCHLORPERAZINE MALEATE 10 MG PO TABS: 10.0000 mg | ORAL_TABLET | Freq: Four times a day (QID) | ORAL | 1 refills | Status: DC | PRN

## 2021-11-07 MED ORDER — POTASSIUM CHLORIDE IN NACL 20-0.9 MEQ/L-% IV SOLN
Freq: Once | INTRAVENOUS | Status: AC
  Filled 2021-11-07: qty 1000

## 2021-11-07 MED ORDER — SODIUM CHLORIDE 0.9 % IV SOLN: INTRAVENOUS | Status: DC

## 2021-11-07 MED ORDER — SODIUM CHLORIDE 0.9 % IV SOLN
8.0000 mg | Freq: Once | INTRAVENOUS | Status: AC
  Administered 2021-11-07: 8 mg via INTRAVENOUS
  Filled 2021-11-07: qty 4

## 2021-11-07 MED ORDER — HEPARIN SOD (PORK) LOCK FLUSH 100 UNIT/ML IV SOLN
500.0000 [IU] | Freq: Once | INTRAVENOUS | Status: AC
  Administered 2021-11-07: 500 [IU] via INTRAVENOUS

## 2021-11-07 MED ORDER — SODIUM CHLORIDE 0.9% FLUSH
10.0000 mL | Freq: Once | INTRAVENOUS | Status: AC
  Administered 2021-11-07: 10 mL via INTRAVENOUS

## 2021-11-07 NOTE — Patient Instructions (Signed)
[name]  Discharge Instructions: Thank you for choosing [facility] to provide your oncology and hematology care.  If you have a lab appointment with the [facility], please come in thru the Main Entrance and check in at the main information desk.  Wear comfortable clothing and clothing appropriate for easy access to any Portacath or PICC line.   We strive to give you quality time with your provider. You may need to reschedule your appointment if you arrive late (15 or more minutes).  Arriving late affects you and other patients whose appointments are after yours.  Also, if you miss three or more appointments without notifying the office, you may be dismissed from the clinic at the provider's discretion.      For prescription refill requests, have your pharmacy contact our office and allow 72 hours for refills to be completed.    Today your Keytruda treatment was held due to symptoms, House fluids and IV zofran were given during your visit. Per Dr. Delton Coombes stop taking lenvatinib Michele Romero).    To help prevent nausea and vomiting after your treatment, we encourage you to take your nausea medication as directed.  BELOW ARE SYMPTOMS THAT SHOULD BE REPORTED IMMEDIATELY: *FEVER GREATER THAN 100.4 F (38 C) OR HIGHER *CHILLS OR SWEATING *NAUSEA AND VOMITING THAT IS NOT CONTROLLED WITH YOUR NAUSEA MEDICATION *UNUSUAL SHORTNESS OF BREATH *UNUSUAL BRUISING OR BLEEDING *URINARY PROBLEMS (pain or burning when urinating, or frequent urination) *BOWEL PROBLEMS (unusual diarrhea, constipation, pain near the anus) TENDERNESS IN MOUTH AND THROAT WITH OR WITHOUT PRESENCE OF ULCERS (sore throat, sores in mouth, or a toothache) UNUSUAL RASH, SWELLING OR PAIN  UNUSUAL VAGINAL DISCHARGE OR ITCHING   Items with * indicate a potential emergency and should be followed up as soon as possible or go to the Emergency Department if any problems should occur.  Please show the  CHEMOTHERAPY ALERT CARD or IMMUNOTHERAPY ALERT CARD at check-in to the Emergency Department and triage nurse.  Should you have questions after your visit or need to cancel or reschedule your appointment, please contact [facility] [phone]  and follow the prompts.  Office hours are 8:00 a.m. to 4:30 p.m. Monday - Friday. Please note that voicemails left after 4:00 p.m. may not be returned until the following business day.  We are closed weekends and major holidays. You have access to a nurse at all times for urgent questions. Please call the main number to the clinic [phone] and follow the prompts.  For any non-urgent questions, you may also contact your provider using MyChart. We now offer e-Visits for anyone 12 and older to request care online for non-urgent symptoms. For details visit [email or web address].   Also download the MyChart app! Go to the app store, search "MyChart", open the app, select [facility], and log in with your MyChart username and password.  Masks are optional in the cancer centers. If you would like for your care team to wear a mask while they are taking care of you, please let them know. For doctor visits, patients may have with them one support person who is at least 62 years old. At this time, visitors are not allowed in the infusion area.

## 2021-11-07 NOTE — Progress Notes (Signed)
Patient presents today for Keytruda, patient reports nausea, weakness and not being able to keep anything down for four days. Dr. Delton Coombes made aware, received orders for house fluids over 2hrs and zofran IV, HOLD keytruda today and patient is to stop lenvatinib at home, patient made aware. Patient tolerated hydration therapy with no complaints voiced. Side effects with management reviewed with understanding verbalized. Port site clean and dry with no bruising or swelling noted at site. Good blood return noted before and after administration of therapy. Band aid applied. Patient left in satisfactory condition with VSS and no s/s of distress noted.

## 2021-11-08 LAB — T4: T4, Total: 6 ug/dL (ref 4.5–12.0)

## 2021-11-13 NOTE — Telephone Encounter (Signed)
Oral Chemotherapy Pharmacist Encounter   [place] and was able to confium that patient received her medication on 11/08/21.   [name], PharmD, BCPS, BCOP, CPP Hematology/Oncology Clinical Pharmacist [place]/DB/AP Oral [facility] [phone]  11/13/2021 3:21 PM

## 2021-11-15 ENCOUNTER — Inpatient Hospital Stay (HOSPITAL_COMMUNITY): Payer: 59

## 2021-11-15 ENCOUNTER — Encounter (HOSPITAL_COMMUNITY): Payer: Self-pay | Admitting: Emergency Medicine

## 2021-11-15 ENCOUNTER — Other Ambulatory Visit: Payer: Self-pay

## 2021-11-15 ENCOUNTER — Inpatient Hospital Stay (HOSPITAL_COMMUNITY)
Admission: EM | Admit: 2021-11-15 | Discharge: 2021-11-21 | DRG: 872 | Disposition: A | Discharge status: Hospice | Payer: 59 | Attending: Internal Medicine | Admitting: Internal Medicine

## 2021-11-15 ENCOUNTER — Emergency Department (HOSPITAL_COMMUNITY): Payer: 59

## 2021-11-15 DIAGNOSIS — Z8249 Family history of ischemic heart disease and other diseases of the circulatory system: Secondary | ICD-10-CM | POA: Diagnosis not present

## 2021-11-15 DIAGNOSIS — Z823 Family history of stroke: Secondary | ICD-10-CM

## 2021-11-15 DIAGNOSIS — K219 Gastro-esophageal reflux disease without esophagitis: Secondary | ICD-10-CM | POA: Diagnosis present

## 2021-11-15 DIAGNOSIS — C799 Secondary malignant neoplasm of unspecified site: Secondary | ICD-10-CM | POA: Diagnosis present

## 2021-11-15 DIAGNOSIS — R601 Generalized edema: Secondary | ICD-10-CM | POA: Diagnosis not present

## 2021-11-15 DIAGNOSIS — E8809 Other disorders of plasma-protein metabolism, not elsewhere classified: Secondary | ICD-10-CM | POA: Diagnosis present

## 2021-11-15 DIAGNOSIS — G893 Neoplasm related pain (acute) (chronic): Secondary | ICD-10-CM | POA: Diagnosis present

## 2021-11-15 DIAGNOSIS — I10 Essential (primary) hypertension: Secondary | ICD-10-CM | POA: Diagnosis present

## 2021-11-15 DIAGNOSIS — I82411 Acute embolism and thrombosis of right femoral vein: Secondary | ICD-10-CM | POA: Diagnosis present

## 2021-11-15 DIAGNOSIS — Z6834 Body mass index (BMI) 34.0-34.9, adult: Secondary | ICD-10-CM

## 2021-11-15 DIAGNOSIS — D649 Anemia, unspecified: Secondary | ICD-10-CM | POA: Diagnosis not present

## 2021-11-15 DIAGNOSIS — E46 Unspecified protein-calorie malnutrition: Secondary | ICD-10-CM

## 2021-11-15 DIAGNOSIS — I824Y1 Acute embolism and thrombosis of unspecified deep veins of right proximal lower extremity: Secondary | ICD-10-CM

## 2021-11-15 DIAGNOSIS — A419 Sepsis, unspecified organism: Secondary | ICD-10-CM | POA: Diagnosis present

## 2021-11-15 DIAGNOSIS — Z91041 Radiographic dye allergy status: Secondary | ICD-10-CM

## 2021-11-15 DIAGNOSIS — Z800 Family history of malignant neoplasm of digestive organs: Secondary | ICD-10-CM

## 2021-11-15 DIAGNOSIS — Z7189 Other specified counseling: Secondary | ICD-10-CM | POA: Diagnosis not present

## 2021-11-15 DIAGNOSIS — N136 Pyonephrosis: Secondary | ICD-10-CM | POA: Diagnosis present

## 2021-11-15 DIAGNOSIS — K56600 Partial intestinal obstruction, unspecified as to cause: Secondary | ICD-10-CM | POA: Diagnosis present

## 2021-11-15 DIAGNOSIS — Z20822 Contact with and (suspected) exposure to covid-19: Secondary | ICD-10-CM | POA: Diagnosis present

## 2021-11-15 DIAGNOSIS — R112 Nausea with vomiting, unspecified: Secondary | ICD-10-CM | POA: Diagnosis present

## 2021-11-15 DIAGNOSIS — E872 Acidosis, unspecified: Secondary | ICD-10-CM | POA: Diagnosis present

## 2021-11-15 DIAGNOSIS — Z923 Personal history of irradiation: Secondary | ICD-10-CM

## 2021-11-15 DIAGNOSIS — Z515 Encounter for palliative care: Secondary | ICD-10-CM | POA: Diagnosis not present

## 2021-11-15 DIAGNOSIS — N390 Urinary tract infection, site not specified: Secondary | ICD-10-CM | POA: Diagnosis not present

## 2021-11-15 DIAGNOSIS — E440 Moderate protein-calorie malnutrition: Secondary | ICD-10-CM

## 2021-11-15 DIAGNOSIS — Z833 Family history of diabetes mellitus: Secondary | ICD-10-CM

## 2021-11-15 DIAGNOSIS — R6520 Severe sepsis without septic shock: Secondary | ICD-10-CM | POA: Diagnosis not present

## 2021-11-15 DIAGNOSIS — D6481 Anemia due to antineoplastic chemotherapy: Secondary | ICD-10-CM | POA: Diagnosis present

## 2021-11-15 DIAGNOSIS — Z79899 Other long term (current) drug therapy: Secondary | ICD-10-CM

## 2021-11-15 DIAGNOSIS — E441 Mild protein-calorie malnutrition: Secondary | ICD-10-CM | POA: Diagnosis present

## 2021-11-15 DIAGNOSIS — J449 Chronic obstructive pulmonary disease, unspecified: Secondary | ICD-10-CM | POA: Diagnosis present

## 2021-11-15 DIAGNOSIS — C541 Malignant neoplasm of endometrium: Secondary | ICD-10-CM | POA: Diagnosis present

## 2021-11-15 DIAGNOSIS — M1611 Unilateral primary osteoarthritis, right hip: Secondary | ICD-10-CM | POA: Diagnosis present

## 2021-11-15 DIAGNOSIS — T451X5A Adverse effect of antineoplastic and immunosuppressive drugs, initial encounter: Secondary | ICD-10-CM | POA: Diagnosis present

## 2021-11-15 DIAGNOSIS — Z28310 Unvaccinated for covid-19: Secondary | ICD-10-CM

## 2021-11-15 DIAGNOSIS — Z885 Allergy status to narcotic agent status: Secondary | ICD-10-CM

## 2021-11-15 DIAGNOSIS — Z87891 Personal history of nicotine dependence: Secondary | ICD-10-CM

## 2021-11-15 LAB — LACTIC ACID, PLASMA
Lactic Acid, Venous: 2.5 mmol/L (ref 0.5–1.9)
Lactic Acid, Venous: 2.3 mmol/L (ref 0.5–1.9)

## 2021-11-15 LAB — COMPREHENSIVE METABOLIC PANEL
ALT: 9 U/L (ref 0–44)
AST: 34 U/L (ref 15–41)
Albumin: 2.3 g/dL — ABNORMAL LOW (ref 3.5–5.0)
Alkaline Phosphatase: 61 U/L (ref 38–126)
Anion gap: 10 (ref 5–15)
BUN: 14 mg/dL (ref 8–23)
CO2: 23 mmol/L (ref 22–32)
Calcium: 8.4 mg/dL — ABNORMAL LOW (ref 8.9–10.3)
Chloride: 102 mmol/L (ref 98–111)
Creatinine, Ser: 0.78 mg/dL (ref 0.44–1.00)
GFR, Estimated: >60 mL/min (ref >60)
Glucose, Bld: 110 mg/dL — ABNORMAL HIGH (ref 70–99)
Potassium: 4 mmol/L (ref 3.5–5.1)
Sodium: 135 mmol/L (ref 135–145)
Total Bilirubin: 0.7 mg/dL (ref 0.3–1.2)
Total Protein: 7.1 g/dL (ref 6.5–8.1)

## 2021-11-15 LAB — CBC
HCT: 22.9 % — ABNORMAL LOW (ref 36.0–46.0)
Hemoglobin: 7 g/dL — ABNORMAL LOW (ref 12.0–15.0)
MCH: 27.5 pg (ref 26.0–34.0)
MCHC: 30.6 g/dL (ref 30.0–36.0)
MCV: 89.8 fL (ref 80.0–100.0)
Platelets: 155 10*3/uL (ref 150–400)
RBC: 2.55 MIL/uL — ABNORMAL LOW (ref 3.87–5.11)
RDW: 18.1 % — ABNORMAL HIGH (ref 11.5–15.5)
WBC: 8.1 10*3/uL (ref 4.0–10.5)
nRBC: 0.4 % — ABNORMAL HIGH (ref 0.0–0.2)

## 2021-11-15 LAB — URINALYSIS, ROUTINE W REFLEX MICROSCOPIC
Bilirubin Urine: NEGATIVE
Glucose, UA: NEGATIVE mg/dL
Ketones, ur: NEGATIVE mg/dL
Nitrite: POSITIVE — AB
Protein, ur: 30 mg/dL — AB
Specific Gravity, Urine: 1.011 (ref 1.005–1.030)
WBC, UA: >50 WBC/hpf — ABNORMAL HIGH (ref 0–5)
pH: 6 (ref 5.0–8.0)

## 2021-11-15 LAB — PROTIME-INR
INR: 1.4 — ABNORMAL HIGH (ref 0.8–1.2)
Prothrombin Time: 17.3 seconds — ABNORMAL HIGH (ref 11.4–15.2)

## 2021-11-15 LAB — RESP PANEL BY RT-PCR (FLU A&B, COVID) ARPGX2
Influenza A by PCR: NEGATIVE
Influenza B by PCR: NEGATIVE
SARS Coronavirus 2 by RT PCR: NEGATIVE

## 2021-11-15 LAB — BRAIN NATRIURETIC PEPTIDE: B Natriuretic Peptide: 78 pg/mL (ref 0.0–100.0)

## 2021-11-15 LAB — LIPASE, BLOOD: Lipase: 24 U/L (ref 11–51)

## 2021-11-15 LAB — APTT: aPTT: 36 seconds (ref 24–36)

## 2021-11-15 MED ORDER — ALBUTEROL SULFATE (2.5 MG/3ML) 0.083% IN NEBU
2.5000 mg | INHALATION_SOLUTION | Freq: Four times a day (QID) | RESPIRATORY_TRACT | Status: DC | PRN
Start: 2021-11-15 — End: 2021-11-21

## 2021-11-15 MED ORDER — VITAMIN B-12 100 MCG PO TABS
500.0000 ug | ORAL_TABLET | Freq: Every day | ORAL | Status: DC
  Administered 2021-11-16 – 2021-11-21 (×6): 500 ug via ORAL
  Filled 2021-11-15 (×6): qty 5

## 2021-11-15 MED ORDER — HEPARIN SODIUM (PORCINE) 5000 UNIT/ML IJ SOLN
5000.0000 [IU] | Freq: Three times a day (TID) | INTRAMUSCULAR | Status: DC
  Administered 2021-11-15 – 2021-11-16 (×2): 5000 [IU] via SUBCUTANEOUS
  Filled 2021-11-15 (×2): qty 1

## 2021-11-15 MED ORDER — HYDROCODONE-ACETAMINOPHEN 5-325 MG PO TABS
1.0000 | ORAL_TABLET | Freq: Four times a day (QID) | ORAL | Status: DC | PRN
  Administered 2021-11-15 – 2021-11-20 (×3): 1 via ORAL
  Filled 2021-11-15 (×3): qty 1

## 2021-11-15 MED ORDER — ONDANSETRON HCL 4 MG PO TABS: 4.0000 mg | ORAL_TABLET | Freq: Four times a day (QID) | ORAL | Status: DC | PRN

## 2021-11-15 MED ORDER — FUROSEMIDE 10 MG/ML IJ SOLN
40.0000 mg | INTRAMUSCULAR | Status: AC
  Administered 2021-11-15: 40 mg via INTRAVENOUS
  Filled 2021-11-15: qty 4

## 2021-11-15 MED ORDER — ACETAMINOPHEN 650 MG RE SUPP: 650.0000 mg | Freq: Four times a day (QID) | RECTAL | Status: DC | PRN

## 2021-11-15 MED ORDER — MORPHINE SULFATE (PF) 2 MG/ML IV SOLN
2.0000 mg | INTRAVENOUS | Status: DC | PRN
  Administered 2021-11-18 – 2021-11-21 (×10): 2 mg via INTRAVENOUS
  Filled 2021-11-15 (×10): qty 1

## 2021-11-15 MED ORDER — ACETAMINOPHEN 325 MG PO TABS
650.0000 mg | ORAL_TABLET | Freq: Four times a day (QID) | ORAL | Status: DC | PRN
  Administered 2021-11-16 – 2021-11-18 (×3): 650 mg via ORAL
  Filled 2021-11-15 (×3): qty 2

## 2021-11-15 MED ORDER — LACTATED RINGERS IV SOLN: INTRAVENOUS | Status: DC

## 2021-11-15 MED ORDER — GABAPENTIN 300 MG PO CAPS
300.0000 mg | ORAL_CAPSULE | Freq: Three times a day (TID) | ORAL | Status: DC
  Administered 2021-11-15 – 2021-11-21 (×17): 300 mg via ORAL
  Filled 2021-11-15 (×17): qty 1

## 2021-11-15 MED ORDER — OXYCODONE HCL 5 MG PO TABS: 5.0000 mg | ORAL_TABLET | ORAL | Status: DC | PRN

## 2021-11-15 MED ORDER — ALBUTEROL SULFATE HFA 108 (90 BASE) MCG/ACT IN AERS: 2.0000 | INHALATION_SPRAY | Freq: Four times a day (QID) | RESPIRATORY_TRACT | Status: DC | PRN

## 2021-11-15 MED ORDER — ONDANSETRON HCL 4 MG/2ML IJ SOLN
4.0000 mg | Freq: Four times a day (QID) | INTRAMUSCULAR | Status: DC | PRN
  Administered 2021-11-15 – 2021-11-20 (×10): 4 mg via INTRAVENOUS
  Filled 2021-11-15 (×10): qty 2

## 2021-11-15 MED ORDER — PANTOPRAZOLE SODIUM 40 MG IV SOLR
40.0000 mg | INTRAVENOUS | Status: DC
Start: 2021-11-15 — End: 2021-11-21
  Administered 2021-11-15 – 2021-11-20 (×6): 40 mg via INTRAVENOUS
  Filled 2021-11-15 (×7): qty 10

## 2021-11-15 MED ORDER — SODIUM CHLORIDE 0.9 % IV SOLN
2.0000 g | INTRAVENOUS | Status: AC
  Administered 2021-11-15 – 2021-11-19 (×5): 2 g via INTRAVENOUS
  Filled 2021-11-15 (×5): qty 20

## 2021-11-15 NOTE — Assessment & Plan Note (Signed)
-   UA indicative of UTI -Complaints of urinary frequency and urgency -Continue Rocephin -See treatment plan for sepsis

## 2021-11-15 NOTE — ED Triage Notes (Signed)
Cancer pt presents with abdominal pain with N/V, started new chemo med on Monday has been sick since Tuesday.

## 2021-11-15 NOTE — Progress Notes (Signed)
Anne Ng with [facility] called stating that patient has a heart rate of 120. She states that patient started chemo on Monday and that since then she has had a headache, complains of abdominal pain, vomiting only able to keep down ginger ale and soup and increase in her feet swelling. She also is wanting a prescription for a hospital bed to be sent to [facility] due to difficulty getting in and out of bed.  Dr. Delton Coombes is aware of patients symptoms and wants patient to go to the emergency department to be checked out. Hospital bed will be addressed at next appointment with patient.  Called patient and she is aware that she needs to go to the emergency department to be seen. She verbalized understanding but did not say if she would go or not.

## 2021-11-15 NOTE — Significant Event (Signed)
eLink is following this Code Sepsis. °

## 2021-11-15 NOTE — Sepsis Progress Note (Signed)
Confirmed with bedside RN that blood cultures were drawn before antibiotic was given.

## 2021-11-15 NOTE — Assessment & Plan Note (Signed)
-   Hemoglobin 7.0 down from 8.1 several days ago -No signs or symptoms of bleeding -Likely related to chemotherapy -Consult Dr. Raliegh Ip -Continue to monitor

## 2021-11-15 NOTE — Assessment & Plan Note (Signed)
-   Pitting edema to umbilicus -Likely related to obstruction of lymph system -Holding off on Lasix at this time as patient's pressures been soft with the lowest reading 99/78, in the setting of sepsis -SCDs -Continue to monitor

## 2021-11-15 NOTE — Assessment & Plan Note (Signed)
-   Albumin 2.3 -Secondary to poor p.o. intake -Due to possible partial SBO on last CT, continue full liquid diet until KUB to monitor for progression of SBO -If patient is able to tolerate diet, would consider starting protein shakes between meals

## 2021-11-15 NOTE — Assessment & Plan Note (Signed)
-   Heart rate of 220, respiratory rate 22, lactic acidosis at 2.5 -UA is indicative of UTI -Continue Rocephin -Urine culture pending -Blood culture pending -Trend lactic acid again in the a.m. -Chest x-ray shows no active disease -No other signs or symptoms of infection

## 2021-11-15 NOTE — Assessment & Plan Note (Signed)
-   Recently started new chemo -Not tolerating very well with abdominal pain, leg swelling, headaches -Dr. Raliegh Ip presentation to the ER -Consult Dr. Raliegh Ip -Hold lenvatinib at this time -Continue to monitor

## 2021-11-15 NOTE — ED Notes (Signed)
Pt assisted onto bedside commode with assist from self and a friend. Pt was offered a purewick, due to receiving lasix, but declined.

## 2021-11-15 NOTE — Assessment & Plan Note (Signed)
-   Start PPI -Continue to monitor

## 2021-11-15 NOTE — Assessment & Plan Note (Signed)
-   Blood pressures currently soft -No antihypertensives at this time

## 2021-11-15 NOTE — H&P (Signed)
History and Physical    Patient: Michele Romero CHE:[id number] DOB: Jun 29, 1960 DOA: 11/15/2021 DOS: the patient was seen and examined on 11/15/2021 PCP: Patient, No Pcp Per  Patient coming from: Home  Chief Complaint:  Chief Complaint  Patient presents with   Abdominal Pain   HPI: Michele Romero is a 61 y.o. female with medical history significant of endometrial cancer, COPD, GERD, migraines, hypertension, and more presents the ED with a chief complaint of headaches, stomach cramping, leg swelling.  Patient reports that she started a new chemotherapy on Monday.  Her symptoms started on Tuesday.  She describes right-sided headaches.  No changes in vision.  Pain pills help and go away.  Dark room, that is quiet also helps but does not relieve the pain.  She has no associated asymptomatic weakness, no difficulty speaking, no difficulty swallowing.  She does report dizziness, but the dizziness seems to be orthostatic as it happens upon standing to go to the bathroom etc.  She describes left-sided abdominal pain.  Last bowel movement was this morning.  It was normal.  Her abdominal pain is intermittent.  P.o. intake makes her nauseous which makes the pain worse.  She reports it comes on consistently about an hour after she takes her chemo pill.  She has had no melena or hematochezia.  She has leg swelling that is been going on for a while but has been progressively worse over the last 5 days.  She has no pain in her legs, no erythema.  She does describe a cramping sensation in her legs, but does not cause pain.  She had no weeping.  Patient denies chest pain, palpitations, shortness of breath.  She denies dysuria but admits to urinary urgency and frequency.  No hematuria.  Patient has no other complaints at this time.  Patient does not smoke, does not drink alcohol, does use marijuana occasionally.  The last use was approximately 1 week ago.  She is not vaccinated for COVID.  Patient is full code. Review  of Systems: As mentioned in the history of present illness. All other systems reviewed and are negative. Past Medical History:  Diagnosis Date   Ambulates with cane 10/31/2020   Arthritis    RIGHT HIP   Asthma    Cigarette nicotine dependence without complication 53/61/4431   Complex tear of medial meniscus of right knee 01/20/2017   COPD (chronic obstructive pulmonary disease) ([place])    ENDOMETRIAL CANCER 10/31/2020   Endometrial cancer ([place]) 11/15/2020   GERD (gastroesophageal reflux disease)    History of radiation therapy    [facility]DR brachytherapy [place] 05/08/2021-06/03/2021  Dr Gery Pray   Migraines    Port-A-Cath in place 01/01/2021   Past Surgical History:  Procedure Laterality Date   APPENDECTOMY     YRS AGO PER PT ON 10-31-2020   BIOPSY  11/29/2019   Procedure: BIOPSY;  Surgeon: Harvel Quale, MD;  [facility];  Service: Gastroenterology;;  ileocecal vlve   CHOLECYSTECTOMY     YRS AGO PER PT ON 10-31-2020   COLONOSCOPY WITH PROPOFOL N/A 11/29/2019   Procedure: COLONOSCOPY WITH PROPOFOL;  Surgeon: Harvel Quale, MD;  Location: [facility];  Service: Gastroenterology;  Laterality: N/A;  930   HYSTEROSCOPY WITH D & C N/A 10/10/2020   Procedure: DILATATION AND CURETTAGE /HYSTEROSCOPY;  Surgeon: Florian Buff, MD;  Location: [facility];  Service: Gynecology;  Laterality: N/A;   IR IMAGING GUIDED PORT INSERTION  01/08/2021   KNEE ARTHROPLASTY  2018   left knee   POLYPECTOMY  11/29/2019   Procedure: POLYPECTOMY;  Surgeon: Montez Morita, Quillian Quince, MD;  Location: [facility];  Service: Gastroenterology;;  sigmoid colon    ROBOTIC ASSISTED TOTAL HYSTERECTOMY WITH BILATERAL SALPINGO OOPHERECTOMY N/A 11/15/2020   Procedure: XI ROBOTIC ASSISTED TOTAL HYSTERECTOMY GREATER THAN TWO HUNDRED AND FIFTY GRAMS WITH BILATERAL SALPINGO OOPHORECTOMY, MINI LAPAROTOMY;  Surgeon: Everitt Amber, MD;  Location: [facility];  Service: Gynecology;  Laterality:  N/A;   SENTINEL NODE BIOPSY N/A 11/15/2020   Procedure: SENTINEL NODE BIOPSY;  Surgeon: Everitt Amber, MD;  Location: [facility];  Service: Gynecology;  Laterality: N/A;   TUBAL LIGATION  04/14/1985   Social History:  reports that she quit smoking about 22 months ago. Her smoking use included cigarettes. She has a 15.00 pack-year smoking history. She has never been exposed to tobacco smoke. She has never used smokeless tobacco. She reports current drug use. Frequency: 6.00 times per week. Drug: Marijuana. She reports that she does not drink alcohol.  Allergies  Allergen Reactions   Ivp Dye [Iodinated Contrast Media] Nausea And Vomiting    Patient had an episode of projectile vomiting after IV injection. Patient states it happens every time she has IV contrast.     Codeine Nausea And Vomiting and Rash    Family History  Problem Relation Age of Onset   Diabetes Mother    Hypertension Mother    Heart disease Mother        CHF   Stroke Mother    Heart disease Father    Colon cancer Brother    Breast cancer Neg Hx    Ovarian cancer Neg Hx    Endometrial cancer Neg Hx    Prostate cancer Neg Hx    Pancreatic cancer Neg Hx     Prior to Admission medications   Medication Sig Start Date End Date Taking? Authorizing Provider  acetaminophen (TYLENOL) 500 MG tablet Take 1,000 mg by mouth every 6 (six) hours as needed for moderate pain.   Yes [provider]  albuterol (VENTOLIN HFA) 108 (90 Base) MCG/ACT inhaler Inhale 2 puffs into the lungs every 6 (six) hours as needed for wheezing or shortness of breath. 10/09/20  Yes [name], NP  gabapentin (NEURONTIN) 300 MG capsule Take 1 capsule (300 mg total) by mouth 3 (three) times daily. Take additional capsule at bed time for total dose '600mg'$  10/16/21  Yes Derek Jack, MD  HYDROcodone-acetaminophen (NORCO/VICODIN) 5-325 MG tablet Take 1 tablet by mouth every 6 hours as needed for moderate pain. 11/05/21  Yes  Derek Jack, MD  lenvatinib 14 mg daily dose (LENVIMA) 10 & 4 MG capsule Take 14 mg by mouth daily. 10/31/21  Yes Derek Jack, MD  loperamide (IMODIUM) 2 MG capsule Take 1 capsule (2 mg total) by mouth as needed for diarrhea or loose stools (Take 2 Capsules after the 1st loose stool and then 1 capsule adter each loose stool. Do not exceed 8 capsules in a 24-hr period. If it is bedtime and you are having loose stools, take 2 capsules at bestime and then take 2 capsules every 4 hours until morning.). 10/09/21  Yes Derek Jack, MD  magnesium oxide (MAG-OX) 400 (240 Mg) MG tablet Take 1 tablet (400 mg total) by mouth in the morning, at noon, and at bedtime. 10/16/21  Yes Derek Jack, MD  vitamin B-12 (CYANOCOBALAMIN) 500 MCG tablet Take 500 mcg by mouth daily.   Yes [provider]  prochlorperazine (COMPAZINE) 10 MG tablet Take 1 tablet (10 mg total) by mouth every 6 (six) hours as needed for nausea or vomiting. Patient not taking: Reported on 11/07/2021 11/07/21   Derek Jack, MD    Physical Exam: Vitals:   11/15/21 1800 11/15/21 1835 11/15/21 1900 11/15/21 1915  BP: 125/87 99/78 120/87   Pulse: (!) 114 (!) 109 (!) 118 (!) 107  Resp: (!) 22 20 (!) 22 17  Temp:      TempSrc:      SpO2: 97% 97% 97% 97%  Weight:      Height:       1.  General: Patient lying supine in bed,  no acute distress   2. Psychiatric: Alert and oriented x 3, mood and behavior normal for situation, pleasant and cooperative with exam   3. Neurologic: Speech and language are normal, face is symmetric, moves all 4 extremities voluntarily, at baseline without acute deficits on limited exam   4. HEENMT:  Head is atraumatic, normocephalic, pupils reactive to light, neck is supple, trachea is midline, mucous membranes are moist   5. Respiratory : Lungs are clear to auscultation bilaterally without wheezing, rhonchi, rales, no cyanosis, no increase in work of breathing or  accessory muscle use   6. Cardiovascular : Heart rate tachycardic, rhythm is regular, no murmurs, rubs or gallops, significant peripheral edema, peripheral pulses palpated   7. Gastrointestinal:  Abdomen is firm on the left side with distention and tenderness to palpation, soft on the right side still mildly distended, active bowel sounds, anasarca to the umbilicus, appears about 6 months pregnant with the size of her abdomen   8. Skin:  Skin is warm, dry and intact without rashes, acute lesions, or ulcers on limited exam   9.Musculoskeletal:  No acute deformities or trauma, no asymmetry in tone, significant peripheral edema peripheral pulses palpated, no tenderness to palpation in the extremities  Data Reviewed: In the ED Temp 98.2, heart rate 99-120, respiratory rate 18-22, blood pressure 99/78-125/87, satting at 97% No leukocytosis with white blood cell count of 8.1, hemoglobin 7.0 Chemistry is unremarkable Albumin 2.3, lactic acid 2.5 then 2.3 Negative flu and COVID UA indicative of UTI Urine culture and blood culture pending Chest x-ray shows no active disease Rocephin started EKG shows sinus tachycardia with a rate of 109, QTc 445  Of note on previous CT there was obstructive uropathy with right hydronephrosis  Assessment and Plan: * Sepsis ([place]) - Heart rate of 220, respiratory rate 22, lactic acidosis at 2.5 -UA is indicative of UTI -Continue Rocephin -Urine culture pending -Blood culture pending -Trend lactic acid again in the a.m. -Chest x-ray shows no active disease -No other signs or symptoms of infection  Protein calorie malnutrition ([facility]) - Albumin 2.3 -Secondary to poor p.o. intake -Due to possible partial SBO on last CT, continue full liquid diet until KUB to monitor for progression of SBO -If patient is able to tolerate diet, would consider starting protein shakes between meals  Anasarca - Pitting edema to umbilicus -Likely related to obstruction of  lymph system -Holding off on Lasix at this time as patient's pressures been soft with the lowest reading 99/78, in the setting of sepsis -SCDs -Continue to monitor  UTI (urinary tract infection) - UA indicative of UTI -Complaints of urinary frequency and urgency -There is concern for pyelonephritis given right-sided obstructive uropathy due to the tumor -Continue Rocephin -See treatment plan for sepsis  GERD (gastroesophageal reflux disease) - Start  PPI -Continue to monitor  Anemia - Hemoglobin 7.0 down from 8.1 several days ago -No signs or symptoms of bleeding -Likely related to chemotherapy -Consult Dr. Raliegh Ip -Continue to monitor  Endometrial cancer [facility]) - Recently started new chemo -Not tolerating very well with abdominal pain, leg swelling, headaches -Dr. Raliegh Ip presentation to the ER -Consult Dr. Raliegh Ip -Hold lenvatinib at this time -Continue to monitor  Essential hypertension - Blood pressures currently soft -No antihypertensives at this time      Advance Care Planning:   Code Status: Prior full  Consults: Oncology  Family Communication: Mother and nephew at bedside  Severity of Illness: The appropriate patient status for this patient is INPATIENT. Inpatient status is judged to be reasonable and necessary in order to provide the required intensity of service to ensure the patient's safety. The patient's presenting symptoms, physical exam findings, and initial radiographic and laboratory data in the context of their chronic comorbidities is felt to place them at high risk for further clinical deterioration. Furthermore, it is not anticipated that the patient will be medically stable for discharge from the hospital within 2 midnights of admission.   * I certify that at the point of admission it is my clinical judgment that the patient will require inpatient hospital care spanning beyond 2 midnights from the point of admission due to high intensity of service, high risk for  further deterioration and high frequency of surveillance required.*  Author: Rolla Plate, DO 11/15/2021 8:26 PM  For on call review [email or web address].

## 2021-11-15 NOTE — ED Provider Notes (Signed)
[facility] EMERGENCY DEPARTMENT Provider Note   CSN: [id number] Arrival date & time: 11/15/21  1515     History  Chief Complaint  Patient presents with   Abdominal Pain    Michele Romero is a 62 y.o. female.   Abdominal Pain  This patient is a 61 year old female presenting to the hospital today with a complaint of fatigue nausea and vomiting.  She has also had some associated severe swelling of her bilateral lower extremities.  The patient has a known history of endometrial cancer which has metastasized, it is rather aggressive and according to the oncology notes which I have reviewed the patient is under the care of Dr. Delton Coombes and has recently as of 5 days ago got back onto a chemotherapy regimen due to her aggressive and aggressive recurrence of her disease.  In the last few days she has become worsened with increasing fatigue, multiple episodes of nausea and vomiting, she feels increasing abdominal pain though she has been urinating without urinary symptoms.  She reports that both of her legs are seriously swollen compared to what they usually  She reports that home health nurse came to the house this morning found her to be tachycardic, called the oncologist who told her to come to the ER    Home Medications Prior to Admission medications   Medication Sig Start Date End Date Taking? Authorizing Provider  acetaminophen (TYLENOL) 500 MG tablet Take 1,000 mg by mouth every 6 (six) hours as needed for moderate pain.   Yes [provider]  albuterol (VENTOLIN HFA) 108 (90 Base) MCG/ACT inhaler Inhale 2 puffs into the lungs every 6 (six) hours as needed for wheezing or shortness of breath. 10/09/20  Yes [name], NP  gabapentin (NEURONTIN) 300 MG capsule Take 1 capsule (300 mg total) by mouth 3 (three) times daily. Take additional capsule at bed time for total dose '600mg'$  10/16/21  Yes Derek Jack, MD  HYDROcodone-acetaminophen (NORCO/VICODIN) 5-325 MG tablet  Take 1 tablet by mouth every 6 hours as needed for moderate pain. 11/05/21  Yes Derek Jack, MD  lenvatinib 14 mg daily dose (LENVIMA) 10 & 4 MG capsule Take 14 mg by mouth daily. 10/31/21  Yes Derek Jack, MD  loperamide (IMODIUM) 2 MG capsule Take 1 capsule (2 mg total) by mouth as needed for diarrhea or loose stools (Take 2 Capsules after the 1st loose stool and then 1 capsule adter each loose stool. Do not exceed 8 capsules in a 24-hr period. If it is bedtime and you are having loose stools, take 2 capsules at bestime and then take 2 capsules every 4 hours until morning.). 10/09/21  Yes Derek Jack, MD  magnesium oxide (MAG-OX) 400 (240 Mg) MG tablet Take 1 tablet (400 mg total) by mouth in the morning, at noon, and at bedtime. 10/16/21  Yes Derek Jack, MD  vitamin B-12 (CYANOCOBALAMIN) 500 MCG tablet Take 500 mcg by mouth daily.   Yes [provider]  prochlorperazine (COMPAZINE) 10 MG tablet Take 1 tablet (10 mg total) by mouth every 6 (six) hours as needed for nausea or vomiting. Patient not taking: Reported on 11/07/2021 11/07/21   Derek Jack, MD      Allergies    Ivp dye [iodinated contrast media] and Codeine    Review of Systems   Review of Systems  Gastrointestinal:  Positive for abdominal pain.  All other systems reviewed and are negative.   Physical Exam Updated Vital Signs BP 120/87   Pulse Marland Kitchen)  107   Temp 98.2 F (36.8 C) (Oral)   Resp 17   Ht 1.575 m ('5\' 2"'$ )   Wt 85.7 kg   LMP 04/14/2010   SpO2 97%   BMI 34.57 kg/m  Physical Exam Vitals and nursing note reviewed.  Constitutional:      General: She is not in acute distress.    Appearance: She is well-developed.  HENT:     Head: Normocephalic and atraumatic.     Nose: Nose normal.     Mouth/Throat:     Mouth: Mucous membranes are moist.     Pharynx: No oropharyngeal exudate.  Eyes:     General: No scleral icterus.       Right eye: No discharge.        Left eye:  No discharge.     Conjunctiva/sclera: Conjunctivae normal.     Pupils: Pupils are equal, round, and reactive to light.  Neck:     Thyroid: No thyromegaly.     Vascular: No JVD.  Cardiovascular:     Rate and Rhythm: Regular rhythm. Tachycardia present.     Heart sounds: Normal heart sounds. No murmur heard.    No friction rub. No gallop.     Comments: Mild JVD present in the supine position Pulmonary:     Effort: Pulmonary effort is normal. No respiratory distress.     Breath sounds: Normal breath sounds. No wheezing or rales.  Abdominal:     General: Bowel sounds are normal. There is no distension.     Palpations: Abdomen is soft. There is no mass.     Tenderness: There is no abdominal tenderness.  Musculoskeletal:        General: No tenderness. Normal range of motion.     Cervical back: Normal range of motion and neck supple.     Right lower leg: Edema present.     Left lower leg: Edema present.     Comments: 3+ edema to the lower extremities, anasarca ranging all the way up onto the abdominal wall, right thigh greater than left thigh  Lymphadenopathy:     Cervical: No cervical adenopathy.  Skin:    General: Skin is warm and dry.     Findings: No erythema or rash.  Neurological:     General: No focal deficit present.     Mental Status: She is alert.     Coordination: Coordination normal.     Comments: Generally weak but able to follow commands.  She has limited use of her right hand, no facial droop, speech clear, memory intact  Psychiatric:        Behavior: Behavior normal.     ED Results / Procedures / Treatments   Labs (all labs ordered are listed, but only abnormal results are displayed) Labs Reviewed  COMPREHENSIVE METABOLIC PANEL - Abnormal; Notable for the following components:      Result Value   Glucose, Bld 110 (*)    Calcium 8.4 (*)    Albumin 2.3 (*)    All other components within normal limits  CBC - Abnormal; Notable for the following components:   RBC  2.55 (*)    Hemoglobin 7.0 (*)    HCT 22.9 (*)    RDW 18.1 (*)    nRBC 0.4 (*)    All other components within normal limits  URINALYSIS, ROUTINE W REFLEX MICROSCOPIC - Abnormal; Notable for the following components:   Color, Urine AMBER (*)    APPearance TURBID (*)  Hgb urine dipstick MODERATE (*)    Protein, ur 30 (*)    Nitrite POSITIVE (*)    Leukocytes,Ua LARGE (*)    WBC, UA >50 (*)    Bacteria, UA FEW (*)    Non Squamous Epithelial 0-5 (*)    All other components within normal limits  LACTIC ACID, PLASMA - Abnormal; Notable for the following components:   Lactic Acid, Venous 2.5 (*)    All other components within normal limits  PROTIME-INR - Abnormal; Notable for the following components:   Prothrombin Time 17.3 (*)    INR 1.4 (*)    All other components within normal limits  RESP PANEL BY RT-PCR (FLU A&B, COVID) ARPGX2  CULTURE, BLOOD (ROUTINE X 2)  CULTURE, BLOOD (ROUTINE X 2)  URINE CULTURE  LIPASE, BLOOD  APTT  BRAIN NATRIURETIC PEPTIDE  LACTIC ACID, PLASMA    EKG None  Radiology DG Chest Port 1 View  Result Date: 11/15/2021 CLINICAL DATA:  Abdominal pain, nausea, vomiting, patient on chemotherapy EXAM: PORTABLE CHEST 1 VIEW COMPARISON:  01/25/2021 FINDINGS: Cardiac size is within normal limits. Lung fields are clear of any infiltrates or pulmonary edema. There is no pleural effusion or pneumothorax. Tip of right chest port is seen at the junction of superior vena cava and right atrium. IMPRESSION: No active disease. Electronically Signed   By: Elmer Picker M.D.   On: 11/15/2021 16:53    Procedures .Critical Care  Performed by: Noemi Chapel, MD Authorized by: Noemi Chapel, MD   Critical care provider statement:    Critical care time (minutes):  45   Critical care time was exclusive of:  Separately billable procedures and treating other patients and teaching time   Critical care was necessary to treat or prevent imminent or life-threatening  deterioration of the following conditions:  Sepsis   Critical care was time spent personally by me on the following activities:  Development of treatment plan with patient or surrogate, discussions with consultants, evaluation of patient's response to treatment, examination of patient, ordering and review of laboratory studies, ordering and review of radiographic studies, ordering and performing treatments and interventions, pulse oximetry, re-evaluation of patient's condition, review of old charts and obtaining history from patient or surrogate   I assumed direction of critical care for this patient from another provider in my specialty: no     Care discussed with: admitting provider   Comments:           Medications Ordered in ED Medications  lactated ringers infusion ( Intravenous New Bag/Given 11/15/21 1647)  cefTRIAXone (ROCEPHIN) 2 g in sodium chloride 0.9 % 100 mL IVPB (0 g Intravenous Stopped 11/15/21 1718)  furosemide (LASIX) injection 40 mg (40 mg Intravenous Given 11/15/21 1832)    ED Course/ Medical Decision Making/ A&P                           Medical Decision Making Amount and/or Complexity of Data Reviewed Labs: ordered. Radiology: ordered. ECG/medicine tests: ordered.  Risk Prescription drug management. Decision regarding hospitalization.   This patient presents to the ED for concern of fatigue nausea vomiting tachycardia, this involves an extensive number of treatment options, and is a complaint that carries with it a high risk of complications and morbidity.  The differential diagnosis includes sepsis, dehydration, renal failure (the patient had a CT scan approximately 2-1/2 weeks ago showing hydronephrosis on the right secondary to an obstructive huge endometrial tumor), would also  consider dehydration, medication reactions   Co morbidities that complicate the patient evaluation  Aggressive endometrial cancer   Additional history obtained:  Additional history  obtained from electronic medical record External records from outside source obtained and reviewed including prior CT scans admission records and oncology notes   Lab Tests:  I Ordered, and personally interpreted labs.  The pertinent results include: The patient has a worsening anemia with a hemoglobin that is gone from 9-7 over the last 2 weeks.  Renal function is preserved, urinalysis shows positive nitrites and many white blood cells consistent with a UTI, lactic acid is 2.5, renal function is unremarkable   Imaging Studies ordered:  I ordered imaging studies including chest x-ray I independently visualized and interpreted imaging which showed no acute new finding I agree with the radiologist interpretation   Cardiac Monitoring: / EKG:  The patient was maintained on a cardiac monitor.  I personally viewed and interpreted the cardiac monitored which showed an underlying rhythm of: Sinus tachycardia   Consultations Obtained:  I requested consultation with the hospitalist,  and discussed lab and imaging findings as well as pertinent plan - they recommend: Admission to the hospital   Problem List / ED Course / Critical interventions / Medication management  The patient ultimately has what appears to be sepsis from a urinary tract infection and with her known hydronephrosis on the right side secondary to the ureteral compression of her uterine malignancy there is certainly concern for retained infection around the kidney or pyelonephritis. I ordered medication including Rocephin and IV fluid for sepsis Reevaluation of the patient after these medicines showed that the patient improved but still tachycardic and ill-appearing, also given Lasix for edema I have reviewed the patients home medicines and have made adjustments as needed   Social Determinants of Health:  Chronically ill, malignant cancer   Test / Admission - Considered:  We will admit to the  hospital         Final Clinical Impression(s) / ED Diagnoses Final diagnoses:  Sepsis without acute organ dysfunction, due to unspecified organism [facility])  Urinary tract infection without hematuria, site unspecified    Rx / DC Orders ED Discharge Orders     None         Noemi Chapel, MD 11/15/21 1930

## 2021-11-15 NOTE — ED Notes (Signed)
ED TO INPATIENT HANDOFF REPORT  ED Nurse Name and Phone #:   S Name/Age/Gender Michele Romero 61 y.o. female Room/Bed: APA11/APA11  Code Status   Code Status: Prior  Home/SNF/Other Home Patient oriented to: self, place, time, and situation Is this baseline? Yes   Triage Complete: Triage complete  Chief Complaint Sepsis [facility]) [A41.9]  Triage Note Cancer pt presents with abdominal pain with N/V, started new chemo med on Monday has been sick since Tuesday.   Allergies Allergies  Allergen Reactions   Ivp Dye [Iodinated Contrast Media] Nausea And Vomiting    Patient had an episode of projectile vomiting after IV injection. Patient states it happens every time she has IV contrast.     Codeine Nausea And Vomiting and Rash    Level of Care/Admitting Diagnosis ED Disposition     ED Disposition  Admit   Condition  --   [place]: [facility] [[id number]]  Level of Care: Stepdown [14]  Covid Evaluation: Confirmed COVID Negative  Diagnosis: Sepsis [facility]) [[id number]]  Admitting Physician: Rolla Plate [[id number]]  Attending Physician: Rolla Plate [[id number]]  Certification:: I certify this patient will need inpatient services for at least 2 midnights  Estimated Length of Stay: 3          B Medical/Surgery History Past Medical History:  Diagnosis Date   Ambulates with cane 10/31/2020   Arthritis    RIGHT HIP   Asthma    Cigarette nicotine dependence without complication 02/54/2706   Complex tear of medial meniscus of right knee 01/20/2017   COPD (chronic obstructive pulmonary disease) ([place])    ENDOMETRIAL CANCER 10/31/2020   Endometrial cancer ([place]) 11/15/2020   GERD (gastroesophageal reflux disease)    History of radiation therapy    HDR brachytherapy [place] 05/08/2021-06/03/2021  Dr Gery Pray   Migraines    Port-A-Cath in place 01/01/2021   Past Surgical History:  Procedure Laterality Date   APPENDECTOMY     YRS AGO PER PT  ON 10-31-2020   BIOPSY  11/29/2019   Procedure: BIOPSY;  Surgeon: Harvel Quale, MD;  [facility];  Service: Gastroenterology;;  ileocecal vlve   CHOLECYSTECTOMY     YRS AGO PER PT ON 10-31-2020   COLONOSCOPY WITH PROPOFOL N/A 11/29/2019   Procedure: COLONOSCOPY WITH PROPOFOL;  Surgeon: Harvel Quale, MD;  Location[facility];  Service: Gastroenterology;  Laterality: N/A;  930   HYSTEROSCOPY WITH D & C N/A 10/10/2020   Procedure: DILATATION AND CURETTAGE /HYSTEROSCOPY;  Surgeon: Florian Buff, MD;  Location: [facility] OR[facility];  Service: Gynecology;  Laterality: N/A;   IR IMAGING GUIDED PORT INSERTION  01/08/2021   KNEE ARTHROPLASTY  2018   left knee   POLYPECTOMY  11/29/2019   Procedure: POLYPECTOMY;  Surgeon: Montez Morita, Quillian Quince, MD;  Location[facility];  Service: Gastroenterology;;  sigmoid colon    ROBOTIC ASSISTED TOTAL HYSTERECTOMY WITH BILATERAL SALPINGO OOPHERECTOMY N/A 11/15/2020   Procedure: XI ROBOTIC ASSISTED TOTAL HYSTERECTOMY GREATER THAN TWO HUNDRED AND FIFTY GRAMS WITH BILATERAL SALPINGO OOPHORECTOMY, MINI LAPAROTOMY;  Surgeon: Everitt Amber, MD;  Location: [facility];  Service: Gynecology;  Laterality: N/A;   SENTINEL NODE BIOPSY N/A 11/15/2020   Procedure: SENTINEL NODE BIOPSY;  Surgeon: Everitt Amber, MD;  Location: [facility];  Service: Gynecology;  Laterality: N/A;   TUBAL LIGATION  04/14/1985     A IV Location/Drains/Wounds Patient Lines/Drains/Airways Status     Active Line/Drains/Airways  Name Placement date Placement time Site Days   Implanted Port 01/08/21 Right Chest 01/08/21  1520  Chest  311   External Urinary Catheter 11/15/21  1930  --  less than 1   Incision (Closed) 10/10/20 Perineum 10/10/20  0802  -- 401   Incision (Closed) 11/15/20 Abdomen Other (Comment) 11/15/20  1432  -- 365   Incision (Closed) 11/15/20 Vagina Other (Comment) 11/15/20  1434  -- 365   Incision (Closed) 11/15/20  Abdomen Other (Comment) 11/15/20  1608  -- 365   Incision - 5 Ports Abdomen Umbilicus Right;Lateral Left;Mid Left;Lateral Left;Upper 11/15/20  1405  -- 365            Intake/Output Last 24 hours No intake or output data in the 24 hours ending 11/15/21 1941  Labs/Imaging Results for orders placed or performed during the hospital encounter of 11/15/21 (from the past 48 hour(s))  Urinalysis, Routine w reflex microscopic     Status: Abnormal   Collection Time: 11/15/21  4:01 PM  Result Value Ref Range   Color, Urine AMBER (A) YELLOW    Comment: BIOCHEMICALS MAY BE AFFECTED BY COLOR   APPearance TURBID (A) CLEAR   Specific Gravity, Urine 1.011 1.005 - 1.030   pH 6.0 5.0 - 8.0   Glucose, UA NEGATIVE NEGATIVE mg/dL   Hgb urine dipstick MODERATE (A) NEGATIVE   Bilirubin Urine NEGATIVE NEGATIVE   Ketones, ur NEGATIVE NEGATIVE mg/dL   Protein, ur 30 (A) NEGATIVE mg/dL   Nitrite POSITIVE (A) NEGATIVE   Leukocytes,Ua LARGE (A) NEGATIVE   RBC / HPF 6-10 0 - 5 RBC/hpf   WBC, UA >50 (H) 0 - 5 WBC/hpf   Bacteria, UA FEW (A) NONE SEEN   Squamous Epithelial / LPF 0-5 0 - 5   WBC Clumps PRESENT    Non Squamous Epithelial 0-5 (A) NONE SEEN    Comment: Performed at [facility], [address] [place], [place] [address]  Resp Panel by RT-PCR (Flu A&B, Covid) Anterior Nasal Swab     Status: None   Collection Time: 11/15/21  4:25 PM   Specimen: Anterior Nasal Swab  Result Value Ref Range   SARS Coronavirus 2 by RT PCR NEGATIVE NEGATIVE    Comment: (NOTE) SARS-CoV-2 target nucleic acids are NOT DETECTED.  The SARS-CoV-2 RNA is generally detectable in upper respiratory specimens during the acute phase of infection. The lowest concentration of SARS-CoV-2 viral copies this assay can detect is 138 copies/mL. A negative result does not preclude SARS-Cov-2 infection and should not be used as the sole basis for treatment or other patient management decisions. A negative result may occur with   improper specimen collection/handling, submission of specimen other than nasopharyngeal swab, presence of viral mutation(s) within the areas targeted by this assay, and inadequate number of viral copies(<138 copies/mL). A negative result must be combined with clinical observations, patient history, and epidemiological information. The expected result is Negative.  Fact Sheet for Patients:  EntrepreneurPulse.com.au  Fact Sheet for Healthcare Providers:  IncredibleEmployment.be  This test is no t yet approved or cleared by the [place] FDA and  has been authorized for detection and/or diagnosis of SARS-CoV-2 by FDA under an Emergency Use Authorization (EUA). This EUA will remain  in effect (meaning this test can be used) for the duration of the COVID-19 declaration under Section 564(b)(1) of the Act, 21 U.S.C.section 360bbb-3(b)(1), unless the authorization is terminated  or revoked sooner.       Influenza A by PCR NEGATIVE  NEGATIVE   Influenza B by PCR NEGATIVE NEGATIVE    Comment: (NOTE) The Xpert Xpress SARS-CoV-2/FLU/RSV plus assay is intended as an aid in the diagnosis of influenza from Nasopharyngeal swab specimens and should not be used as a sole basis for treatment. Nasal washings and aspirates are unacceptable for Xpert Xpress SARS-CoV-2/FLU/RSV testing.  Fact Sheet for Patients: EntrepreneurPulse.com.au  Fact Sheet for Healthcare Providers: IncredibleEmployment.be  This test is not yet approved or cleared by the [place] FDA and has been authorized for detection and/or diagnosis of SARS-CoV-2 by FDA under an Emergency Use Authorization (EUA). This EUA will remain in effect (meaning this test can be used) for the duration of the COVID-19 declaration under Section 564(b)(1) of the Act, 21 U.S.C. section 360bbb-3(b)(1), unless the authorization is terminated or revoked.  Performed at  [facility], [address], [place], [place] [address]   Lipase, blood     Status: None   Collection Time: 11/15/21  4:50 PM  Result Value Ref Range   Lipase 24 11 - 51 U/L    Comment: Performed at [facility], [address] [place], [place] [address]  Comprehensive metabolic panel     Status: Abnormal   Collection Time: 11/15/21  4:50 PM  Result Value Ref Range   Sodium 135 135 - 145 mmol/L   Potassium 4.0 3.5 - 5.1 mmol/L   Chloride 102 98 - 111 mmol/L   CO2 23 22 - 32 mmol/L   Glucose, Bld 110 (H) 70 - 99 mg/dL    Comment: Glucose reference range applies only to samples taken after fasting for at least 8 hours.   BUN 14 8 - 23 mg/dL   Creatinine, Ser 0.78 0.44 - 1.00 mg/dL   Calcium 8.4 (L) 8.9 - 10.3 mg/dL   Total Protein 7.1 6.5 - 8.1 g/dL   Albumin 2.3 (L) 3.5 - 5.0 g/dL   AST 34 15 - 41 U/L   ALT 9 0 - 44 U/L   Alkaline Phosphatase 61 38 - 126 U/L   Total Bilirubin 0.7 0.3 - 1.2 mg/dL   GFR, Estimated >60 >60 mL/min    Comment: (NOTE) Calculated using the CKD-EPI Creatinine Equation (2021)    Anion gap 10 5 - 15    Comment: Performed at [facility], [address] [place], [place] [address]  CBC     Status: Abnormal   Collection Time: 11/15/21  4:50 PM  Result Value Ref Range   WBC 8.1 4.0 - 10.5 K/uL   RBC 2.55 (L) 3.87 - 5.11 MIL/uL   Hemoglobin 7.0 (L) 12.0 - 15.0 g/dL   HCT 22.9 (L) 36.0 - 46.0 %   MCV 89.8 80.0 - 100.0 fL   MCH 27.5 26.0 - 34.0 pg   MCHC 30.6 30.0 - 36.0 g/dL   RDW 18.1 (H) 11.5 - 15.5 %   Platelets 155 150 - 400 K/uL   nRBC 0.4 (H) 0.0 - 0.2 %    Comment: Performed at [facility], [address], [place], [place] [address]  Lactic acid, plasma     Status: Abnormal   Collection Time: 11/15/21  4:50 PM  Result Value Ref Range   Lactic Acid, Venous 2.5 (HH) 0.5 - 1.9 mmol/L    Comment: CRITICAL RESULT CALLED TO, READ BACK BY AND VERIFIED WITH: [place] '@1720'$  11/15/21 BY GMCGEEHON. Performed at [facility], [address],  [place], [place] [address]   Protime-INR     Status: Abnormal   Collection  Time: 11/15/21  4:50 PM  Result Value Ref Range   Prothrombin Time 17.3 (H) 11.4 - 15.2 seconds   INR 1.4 (H) 0.8 - 1.2    Comment: (NOTE) INR goal varies based on device and disease states. Performed at [facility], [address], [place], [place] [address]   APTT     Status: None   Collection Time: 11/15/21  4:50 PM  Result Value Ref Range   aPTT 36 24 - 36 seconds    Comment: Performed at [facility], [address] [place], [place] [address]  Brain natriuretic peptide     Status: None   Collection Time: 11/15/21  4:50 PM  Result Value Ref Range   B Natriuretic Peptide 78.0 0.0 - 100.0 pg/mL    Comment: Performed at [facility], [address] [place], [place] [address]  Lactic acid, plasma     Status: Abnormal   Collection Time: 11/15/21  6:39 PM  Result Value Ref Range   Lactic Acid, Venous 2.3 (HH) 0.5 - 1.9 mmol/L    Comment: CRITICAL VALUE NOTED.  VALUE IS CONSISTENT WITH PREVIOUSLY REPORTED AND CALLED VALUE. Performed at [facility], [address] [place], [place] [address]    DG Chest [place] 1 View  Result Date: 11/15/2021 CLINICAL DATA:  Abdominal pain, nausea, vomiting, patient on chemotherapy EXAM: PORTABLE CHEST 1 VIEW COMPARISON:  01/25/2021 FINDINGS: Cardiac size is within normal limits. Lung fields are clear of any infiltrates or pulmonary edema. There is no pleural effusion or pneumothorax. Tip of right chest port is seen at the junction of superior vena cava and right atrium. IMPRESSION: No active disease. Electronically Signed   By: Elmer Picker M.D.   On: 11/15/2021 16:53    Pending Labs Unresulted Labs (From admission, onward)     Start     Ordered   11/15/21 1625  Blood Culture (routine x 2)  (Septic presentation on arrival (screening labs, nursing and treatment orders for obvious sepsis))  BLOOD CULTURE X 2,   STAT     Question:  Patient immune status  Answer:  Immunocompromised    11/15/21 1626   11/15/21 1625  Urine Culture  (Septic presentation on arrival (screening labs, nursing and treatment orders for obvious sepsis))  ONCE - URGENT,   URGENT       Question Answer Comment  Indication Sepsis   Patient immune status Immunocompromised      11/15/21 1626   Signed and Held  [employer] morning,   R        Signed and Held   Signed and [employer], blood  Tomorrow morning,   R        Signed and Held   Signed and Held  Procalcitonin  Tomorrow morning,   R        Signed and Held   Signed and Held  Comprehensive metabolic panel  Tomorrow morning,   R        Signed and Held   Signed and Held  Magnesium  Tomorrow morning,   R        Signed and Held   Signed and Held  CBC with Differential/Platelet  Tomorrow morning,   R        Signed and Held   Signed and Held  TSH  Tomorrow morning,   R        Signed and Held            Vitals/Pain Today's Vitals  11/15/21 1800 11/15/21 1835 11/15/21 1900 11/15/21 1915  BP: 125/87 99/78 120/87   Pulse: (!) 114 (!) 109 (!) 118 (!) 107  Resp: (!) 22 20 (!) 22 17  Temp:      TempSrc:      SpO2: 97% 97% 97% 97%  Weight:      Height:      PainSc:        Isolation Precautions No active isolations  Medications Medications  lactated ringers infusion ( Intravenous New Bag/Given 11/15/21 1647)  cefTRIAXone (ROCEPHIN) 2 g in sodium chloride 0.9 % 100 mL IVPB (0 g Intravenous Stopped 11/15/21 1718)  morphine (PF) 2 MG/ML injection 2 mg (has no administration in time range)  furosemide (LASIX) injection 40 mg (40 mg Intravenous Given 11/15/21 1832)    Mobility walks with person assist Low fall risk   Focused Assessments    R Recommendations: See Admitting Provider Note  Report given to:   Additional Notes:

## 2021-11-16 ENCOUNTER — Inpatient Hospital Stay (HOSPITAL_COMMUNITY): Payer: 59

## 2021-11-16 DIAGNOSIS — R6520 Severe sepsis without septic shock: Secondary | ICD-10-CM | POA: Diagnosis not present

## 2021-11-16 DIAGNOSIS — A419 Sepsis, unspecified organism: Secondary | ICD-10-CM | POA: Diagnosis not present

## 2021-11-16 LAB — COMPREHENSIVE METABOLIC PANEL
ALT: 9 U/L (ref 0–44)
AST: 29 U/L (ref 15–41)
Albumin: 2 g/dL — ABNORMAL LOW (ref 3.5–5.0)
Alkaline Phosphatase: 59 U/L (ref 38–126)
Anion gap: 7 (ref 5–15)
BUN: 13 mg/dL (ref 8–23)
CO2: 24 mmol/L (ref 22–32)
Calcium: 8.1 mg/dL — ABNORMAL LOW (ref 8.9–10.3)
Chloride: 103 mmol/L (ref 98–111)
Creatinine, Ser: 0.8 mg/dL (ref 0.44–1.00)
GFR, Estimated: >60 mL/min (ref >60)
Glucose, Bld: 91 mg/dL (ref 70–99)
Potassium: 4 mmol/L (ref 3.5–5.1)
Sodium: 134 mmol/L — ABNORMAL LOW (ref 135–145)
Total Bilirubin: 0.7 mg/dL (ref 0.3–1.2)
Total Protein: 6.4 g/dL — ABNORMAL LOW (ref 6.5–8.1)

## 2021-11-16 LAB — TSH: TSH: 5.097 u[IU]/mL — ABNORMAL HIGH (ref 0.350–4.500)

## 2021-11-16 LAB — CBC WITH DIFFERENTIAL/PLATELET
Abs Immature Granulocytes: 0.07 10*3/uL (ref 0.00–0.07)
Basophils Absolute: 0.1 10*3/uL (ref 0.0–0.1)
Basophils Relative: 1 %
Eosinophils Absolute: 0 10*3/uL (ref 0.0–0.5)
Eosinophils Relative: 1 %
HCT: 21.6 % — ABNORMAL LOW (ref 36.0–46.0)
Hemoglobin: 6.8 g/dL — CL (ref 12.0–15.0)
Immature Granulocytes: 1 %
Lymphocytes Relative: 24 %
Lymphs Abs: 1.6 10*3/uL (ref 0.7–4.0)
MCH: 27.5 pg (ref 26.0–34.0)
MCHC: 31.5 g/dL (ref 30.0–36.0)
MCV: 87.4 fL (ref 80.0–100.0)
Monocytes Absolute: 0.7 10*3/uL (ref 0.1–1.0)
Monocytes Relative: 11 %
Neutro Abs: 4.1 10*3/uL (ref 1.7–7.7)
Neutrophils Relative %: 62 %
Platelets: 173 10*3/uL (ref 150–400)
RBC: 2.47 MIL/uL — ABNORMAL LOW (ref 3.87–5.11)
RDW: 18.6 % — ABNORMAL HIGH (ref 11.5–15.5)
WBC: 6.6 10*3/uL (ref 4.0–10.5)
nRBC: 0.5 % — ABNORMAL HIGH (ref 0.0–0.2)

## 2021-11-16 LAB — LACTIC ACID, PLASMA: Lactic Acid, Venous: 1.6 mmol/L (ref 0.5–1.9)

## 2021-11-16 LAB — HEPARIN LEVEL (UNFRACTIONATED)
Heparin Unfractionated: 0.61 IU/mL (ref 0.30–0.70)
Heparin Unfractionated: 0.58 IU/mL (ref 0.30–0.70)

## 2021-11-16 LAB — PREPARE RBC (CROSSMATCH)

## 2021-11-16 LAB — MAGNESIUM: Magnesium: 1.7 mg/dL (ref 1.7–2.4)

## 2021-11-16 LAB — HEMOGLOBIN AND HEMATOCRIT, BLOOD
HCT: 26.5 % — ABNORMAL LOW (ref 36.0–46.0)
Hemoglobin: 8.6 g/dL — ABNORMAL LOW (ref 12.0–15.0)

## 2021-11-16 LAB — PROTIME-INR
INR: 1.5 — ABNORMAL HIGH (ref 0.8–1.2)
Prothrombin Time: 17.6 seconds — ABNORMAL HIGH (ref 11.4–15.2)

## 2021-11-16 LAB — PHOSPHORUS: Phosphorus: 3.5 mg/dL (ref 2.5–4.6)

## 2021-11-16 LAB — PROCALCITONIN: Procalcitonin: 1.08 ng/mL

## 2021-11-16 LAB — CORTISOL-AM, BLOOD: Cortisol - AM: 16.5 ug/dL (ref 6.7–22.6)

## 2021-11-16 LAB — MRSA NEXT GEN BY PCR, NASAL: MRSA by PCR Next Gen: NOT DETECTED

## 2021-11-16 MED ORDER — HEPARIN (PORCINE) 25000 UT/250ML-% IV SOLN
1150.0000 [IU]/h | INTRAVENOUS | Status: DC
  Administered 2021-11-16 – 2021-11-19 (×4): 1150 [IU]/h via INTRAVENOUS
  Filled 2021-11-16 (×4): qty 250

## 2021-11-16 MED ORDER — MIDODRINE HCL 5 MG PO TABS
5.0000 mg | ORAL_TABLET | Freq: Three times a day (TID) | ORAL | Status: DC | PRN
Start: 2021-11-16 — End: 2021-11-21
  Administered 2021-11-20 (×3): 5 mg via ORAL
  Filled 2021-11-16 (×3): qty 1

## 2021-11-16 MED ORDER — IOHEXOL 350 MG/ML SOLN
100.0000 mL | Freq: Once | INTRAVENOUS | Status: AC | PRN
Start: 2021-11-16 — End: 2021-11-16
  Administered 2021-11-16: 100 mL via INTRAVENOUS

## 2021-11-16 MED ORDER — MIDODRINE HCL 5 MG PO TABS
5.0000 mg | ORAL_TABLET | Freq: Three times a day (TID) | ORAL | Status: DC
  Filled 2021-11-16: qty 1

## 2021-11-16 MED ORDER — ENSURE ENLIVE PO LIQD
237.0000 mL | Freq: Three times a day (TID) | ORAL | Status: DC
  Administered 2021-11-16 – 2021-11-21 (×12): 237 mL via ORAL

## 2021-11-16 MED ORDER — HEPARIN BOLUS VIA INFUSION
4000.0000 [IU] | Freq: Once | INTRAVENOUS | Status: AC
  Administered 2021-11-16: 4000 [IU] via INTRAVENOUS
  Filled 2021-11-16: qty 4000

## 2021-11-16 MED ORDER — LACTATED RINGERS IV SOLN: INTRAVENOUS | Status: AC

## 2021-11-16 MED ORDER — SODIUM CHLORIDE 0.9% IV SOLUTION: Freq: Once | INTRAVENOUS | Status: AC

## 2021-11-16 MED ORDER — CHLORHEXIDINE GLUCONATE CLOTH 2 % EX PADS
6.0000 | MEDICATED_PAD | Freq: Every day | CUTANEOUS | Status: DC
  Administered 2021-11-16 – 2021-11-21 (×6): 6 via TOPICAL

## 2021-11-16 MED ORDER — METOCLOPRAMIDE HCL 5 MG/ML IJ SOLN
10.0000 mg | Freq: Once | INTRAMUSCULAR | Status: AC
  Administered 2021-11-16: 10 mg via INTRAVENOUS
  Filled 2021-11-16: qty 2

## 2021-11-16 NOTE — Progress Notes (Signed)
ANTICOAGULATION CONSULT NOTE   Pharmacy Consult for heparin Indication: DVT  Allergies  Allergen Reactions   Ivp Dye [Iodinated Contrast Media] Nausea And Vomiting    Patient had an episode of projectile vomiting after IV injection. Patient states it happens every time she has IV contrast.     Codeine Nausea And Vomiting and Rash    Patient Measurements: Height: '5\' 2"'$  (157.5 cm) Weight: 86.7 kg (191 lb 2.2 oz) IBW/kg (Calculated) : 50.1 Heparin Dosing Weight: 70 kg  Vital Signs: Temp: 99.2 F (37.3 C) (08/05 1635) Temp Source: Oral (08/05 1635) BP: 113/86 (08/05 1615) Pulse Rate: 113 (08/05 1615)  Labs: Recent Labs    11/15/21 1650 11/16/21 0531 11/16/21 1702  HGB 7.0* 6.8*  --   HCT 22.9* 21.6*  --   PLT 155 173  --   APTT 36  --   --   LABPROT 17.3* 17.6*  --   INR 1.4* 1.5*  --   HEPARINUNFRC  --   --  0.61  CREATININE 0.78 0.80  --      Estimated Creatinine Clearance: 74.5 mL/min (by C-G formula based on SCr of 0.8 mg/dL).   Medical History: Past Medical History:  Diagnosis Date   Ambulates with cane 10/31/2020   Arthritis    RIGHT HIP   Asthma    Cigarette nicotine dependence without complication 10/08/9483   Complex tear of medial meniscus of right knee 01/20/2017   COPD (chronic obstructive pulmonary disease) ([place])    ENDOMETRIAL CANCER 10/31/2020   Endometrial cancer ([place]) 11/15/2020   GERD (gastroesophageal reflux disease)    History of radiation therapy    HDR brachytherapy [place] 05/08/2021-06/03/2021  Dr Gery Pray   Migraines    Port-A-Cath in place 01/01/2021    Medications:  Medications Prior to Admission  Medication Sig Dispense Refill Last Dose   acetaminophen (TYLENOL) 500 MG tablet Take 1,000 mg by mouth every 6 (six) hours as needed for moderate pain.   UNKNOWN   albuterol (VENTOLIN HFA) 108 (90 Base) MCG/ACT inhaler Inhale 2 puffs into the lungs every 6 (six) hours as needed for wheezing or shortness of breath. 8 g 2 UNKNOWN    gabapentin (NEURONTIN) 300 MG capsule Take 1 capsule (300 mg total) by mouth 3 (three) times daily. Take additional capsule at bed time for total dose '600mg'$  120 capsule 6 11/15/2021   HYDROcodone-acetaminophen (NORCO/VICODIN) 5-325 MG tablet Take 1 tablet by mouth every 6 hours as needed for moderate pain. 60 tablet 0 11/14/2021   lenvatinib 14 mg daily dose (LENVIMA) 10 & 4 MG capsule Take 14 mg by mouth daily. 60 capsule 3 11/15/2021   loperamide (IMODIUM) 2 MG capsule Take 1 capsule (2 mg total) by mouth as needed for diarrhea or loose stools (Take 2 Capsules after the 1st loose stool and then 1 capsule adter each loose stool. Do not exceed 8 capsules in a 24-hr period. If it is bedtime and you are having loose stools, take 2 capsules at bestime and then take 2 capsules every 4 hours until morning.). 30 capsule 0 UNKNOWN   magnesium oxide (MAG-OX) 400 (240 Mg) MG tablet Take 1 tablet (400 mg total) by mouth in the morning, at noon, and at bedtime. 90 tablet 6 11/15/2021   vitamin B-12 (CYANOCOBALAMIN) 500 MCG tablet Take 500 mcg by mouth daily.   UNKNOWN   prochlorperazine (COMPAZINE) 10 MG tablet Take 1 tablet (10 mg total) by mouth every 6 (six) hours as needed for nausea  or vomiting. (Patient not taking: Reported on 11/07/2021) 60 tablet 1 Not Taking    Assessment: Pharmacy consulted to dose heparin in patient with DVT confirmed by [place].  CT angio pending for PE.  Patient is not on anticoagulation prior to admission.  Heparin level- 0.61: therapeutic  Hgb 6.8- 1 unit PRBC given. No s/s of bleeding  Goal of Therapy:  Heparin level 0.3-0.7 units/ml Monitor platelets by anticoagulation protocol: Yes    Plan:  Continue heparin infusion at 1150 units/hr Check anti-Xa level in 6 hours and daily Continue to monitor H&H and platelets   Margot Ables, PharmD Clinical Pharmacist 11/16/2021 5:29 PM

## 2021-11-16 NOTE — Progress Notes (Signed)
ANTICOAGULATION CONSULT NOTE   Pharmacy Consult for heparin Indication: DVT  Allergies  Allergen Reactions   Ivp Dye [Iodinated Contrast Media] Nausea And Vomiting    Patient had an episode of projectile vomiting after IV injection. Patient states it happens every time she has IV contrast.     Codeine Nausea And Vomiting and Rash    Patient Measurements: Height: '5\' 2"'$  (157.5 cm) Weight: 86.7 kg (191 lb 2.2 oz) IBW/kg (Calculated) : 50.1 Heparin Dosing Weight: 70 kg  Vital Signs: Temp: 99.3 F (37.4 C) (08/05 2000) Temp Source: Oral (08/05 2000) BP: 103/76 (08/05 2000) Pulse Rate: 118 (08/05 2000)  Labs: Recent Labs    11/15/21 1650 11/16/21 0531 11/16/21 1702 11/16/21 2310  HGB 7.0* 6.8* 8.6*  --   HCT 22.9* 21.6* 26.5*  --   PLT 155 173  --   --   APTT 36  --   --   --   LABPROT 17.3* 17.6*  --   --   INR 1.4* 1.5*  --   --   HEPARINUNFRC  --   --  0.61 0.58  CREATININE 0.78 0.80  --   --      Estimated Creatinine Clearance: 74.5 mL/min (by C-G formula based on SCr of 0.8 mg/dL).   Medical History: Past Medical History:  Diagnosis Date   Ambulates with cane 10/31/2020   Arthritis    RIGHT HIP   Asthma    Cigarette nicotine dependence without complication 27/51/7001   Complex tear of medial meniscus of right knee 01/20/2017   COPD (chronic obstructive pulmonary disease) ([place])    ENDOMETRIAL CANCER 10/31/2020   Endometrial cancer ([place]) 11/15/2020   GERD (gastroesophageal reflux disease)    History of radiation therapy    HDR brachytherapy [place] 05/08/2021-06/03/2021  Dr Gery Pray   Migraines    Port-A-Cath in place 01/01/2021    Medications:  Medications Prior to Admission  Medication Sig Dispense Refill Last Dose   acetaminophen (TYLENOL) 500 MG tablet Take 1,000 mg by mouth every 6 (six) hours as needed for moderate pain.   UNKNOWN   albuterol (VENTOLIN HFA) 108 (90 Base) MCG/ACT inhaler Inhale 2 puffs into the lungs every 6 (six) hours as needed  for wheezing or shortness of breath. 8 g 2 UNKNOWN   gabapentin (NEURONTIN) 300 MG capsule Take 1 capsule (300 mg total) by mouth 3 (three) times daily. Take additional capsule at bed time for total dose '600mg'$  120 capsule 6 11/15/2021   HYDROcodone-acetaminophen (NORCO/VICODIN) 5-325 MG tablet Take 1 tablet by mouth every 6 hours as needed for moderate pain. 60 tablet 0 11/14/2021   lenvatinib 14 mg daily dose (LENVIMA) 10 & 4 MG capsule Take 14 mg by mouth daily. 60 capsule 3 11/15/2021   loperamide (IMODIUM) 2 MG capsule Take 1 capsule (2 mg total) by mouth as needed for diarrhea or loose stools (Take 2 Capsules after the 1st loose stool and then 1 capsule adter each loose stool. Do not exceed 8 capsules in a 24-hr period. If it is bedtime and you are having loose stools, take 2 capsules at bestime and then take 2 capsules every 4 hours until morning.). 30 capsule 0 UNKNOWN   magnesium oxide (MAG-OX) 400 (240 Mg) MG tablet Take 1 tablet (400 mg total) by mouth in the morning, at noon, and at bedtime. 90 tablet 6 11/15/2021   vitamin B-12 (CYANOCOBALAMIN) 500 MCG tablet Take 500 mcg by mouth daily.   UNKNOWN   prochlorperazine (  COMPAZINE) 10 MG tablet Take 1 tablet (10 mg total) by mouth every 6 (six) hours as needed for nausea or vomiting. (Patient not taking: Reported on 11/07/2021) 60 tablet 1 Not Taking    Assessment: Pharmacy consulted to dose heparin in patient with DVT confirmed by [place]. CT angio - negative for PE but imaging limited.  Patient is not on anticoagulation prior to admission.  Heparin level - 0.58: remains therapeutic  Hgb 6.8 - 1 unit PRBC given, improved to 8.6. Plt WNL. No s/s of bleeding documented.  Goal of Therapy:  Heparin level 0.3-0.7 units/ml Monitor platelets by anticoagulation protocol: Yes    Plan:  Continue heparin infusion at 1150 units/hr Monitor daily heparin level and CBC, s/sx bleeding F/u long-term anticoagulation plan   Arturo Morton, PharmD, BCPS Please  check AMION for all [place] contact numbers Clinical Pharmacist 11/16/2021 11:43 PM

## 2021-11-16 NOTE — Progress Notes (Signed)
ANTICOAGULATION CONSULT NOTE - Initial Consult  Pharmacy Consult for heparin Indication: DVT  Allergies  Allergen Reactions   Ivp Dye [Iodinated Contrast Media] Nausea And Vomiting    Patient had an episode of projectile vomiting after IV injection. Patient states it happens every time she has IV contrast.     Codeine Nausea And Vomiting and Rash    Patient Measurements: Height: '5\' 2"'$  (157.5 cm) Weight: 86.7 kg (191 lb 2.2 oz) IBW/kg (Calculated) : 50.1 Heparin Dosing Weight: 70 kg  Vital Signs: Temp: 98.3 F (36.8 C) (08/05 0758) Temp Source: Oral (08/05 0758) BP: 99/73 (08/05 0700) Pulse Rate: 102 (08/05 0700)  Labs: Recent Labs    11/15/21 1650 11/16/21 0531  HGB 7.0* 6.8*  HCT 22.9* 21.6*  PLT 155 173  APTT 36  --   LABPROT 17.3* 17.6*  INR 1.4* 1.5*  CREATININE 0.78 0.80    Estimated Creatinine Clearance: 74.5 mL/min (by C-G formula based on SCr of 0.8 mg/dL).   Medical History: Past Medical History:  Diagnosis Date   Ambulates with cane 10/31/2020   Arthritis    RIGHT HIP   Asthma    Cigarette nicotine dependence without complication 69/67/8938   Complex tear of medial meniscus of right knee 01/20/2017   COPD (chronic obstructive pulmonary disease) ([place])    ENDOMETRIAL CANCER 10/31/2020   Endometrial cancer ([place]) 11/15/2020   GERD (gastroesophageal reflux disease)    History of radiation therapy    HDR brachytherapy [place] 05/08/2021-06/03/2021  Dr Gery Pray   Migraines    Port-A-Cath in place 01/01/2021    Medications:  Medications Prior to Admission  Medication Sig Dispense Refill Last Dose   acetaminophen (TYLENOL) 500 MG tablet Take 1,000 mg by mouth every 6 (six) hours as needed for moderate pain.   UNKNOWN   albuterol (VENTOLIN HFA) 108 (90 Base) MCG/ACT inhaler Inhale 2 puffs into the lungs every 6 (six) hours as needed for wheezing or shortness of breath. 8 g 2 UNKNOWN   gabapentin (NEURONTIN) 300 MG capsule Take 1 capsule (300 mg total)  by mouth 3 (three) times daily. Take additional capsule at bed time for total dose '600mg'$  120 capsule 6 11/15/2021   HYDROcodone-acetaminophen (NORCO/VICODIN) 5-325 MG tablet Take 1 tablet by mouth every 6 hours as needed for moderate pain. 60 tablet 0 11/14/2021   lenvatinib 14 mg daily dose (LENVIMA) 10 & 4 MG capsule Take 14 mg by mouth daily. 60 capsule 3 11/15/2021   loperamide (IMODIUM) 2 MG capsule Take 1 capsule (2 mg total) by mouth as needed for diarrhea or loose stools (Take 2 Capsules after the 1st loose stool and then 1 capsule adter each loose stool. Do not exceed 8 capsules in a 24-hr period. If it is bedtime and you are having loose stools, take 2 capsules at bestime and then take 2 capsules every 4 hours until morning.). 30 capsule 0 UNKNOWN   magnesium oxide (MAG-OX) 400 (240 Mg) MG tablet Take 1 tablet (400 mg total) by mouth in the morning, at noon, and at bedtime. 90 tablet 6 11/15/2021   vitamin B-12 (CYANOCOBALAMIN) 500 MCG tablet Take 500 mcg by mouth daily.   UNKNOWN   prochlorperazine (COMPAZINE) 10 MG tablet Take 1 tablet (10 mg total) by mouth every 6 (six) hours as needed for nausea or vomiting. (Patient not taking: Reported on 11/07/2021) 60 tablet 1 Not Taking    Assessment: Pharmacy consulted to dose heparin in patient with DVT confirmed by [place].  CT  angio pending for PE.  Patient is not on anticoagulation prior to admission.  Heparin subq given 8/5 0608  Hgb 6.8- 1 unit PRBC given. No s/s of bleeding  Goal of Therapy:  Heparin level 0.3-0.7 units/ml Monitor platelets by anticoagulation protocol: Yes   Plan:  Give 4000 units bolus x 1 Start heparin infusion at 1150 units/hr Check anti-Xa level in 6-8 hours and daily while on heparin Continue to monitor H&H and platelets  Margot Ables, PharmD Clinical Pharmacist 11/16/2021 10:26 AM

## 2021-11-16 NOTE — Progress Notes (Signed)
[facility] Progress Note  Patient: Michele Romero    RFF:[id number]  DOA: 11/15/2021     Date of Service: the patient was seen and examined on 11/16/2021  Chief Complaint  Patient presents with   Abdominal Pain   Brief hospital course: Michele Romero is a 61 y.o. female with medical history significant of endometrial cancer, COPD, GERD, migraines, hypertension, and more presents the ED with a chief complaint of headaches, stomach cramping, leg swelling.  Patient reports that she started a new chemotherapy on Monday.  Her symptoms started on Tuesday.  She describes right-sided headaches.  No changes in vision.  Pain pills help and go away.  Dark room, that is quiet also helps but does not relieve the pain.  She has no associated asymptomatic weakness, no difficulty speaking, no difficulty swallowing.  She does report dizziness, but the dizziness seems to be orthostatic as it happens upon standing to go to the bathroom etc.  She describes left-sided abdominal pain.  Last bowel movement was this morning.  It was normal.  Her abdominal pain is intermittent.  P.o. intake makes her nauseous which makes the pain worse.  She reports it comes on consistently about an hour after she takes her chemo pill.  She has had no melena or hematochezia.  She has leg swelling that is been going on for a while but has been progressively worse over the last 5 days.  She has no pain in her legs, no erythema.  She does describe a cramping sensation in her legs, but does not cause pain.  She had no weeping.  Patient denies chest pain, palpitations, shortness of breath.  She denies dysuria but admits to urinary urgency and frequency.  No hematuria.  Patient has no other complaints at this time.   Patient does not smoke, does not drink alcohol, does use marijuana occasionally.  The last use was approximately 1 week ago.  She is not vaccinated for COVID.  Patient is full code.   Assessment and Plan: Sepsis ([place]) - Heart  rate of 220, respiratory rate 22, lactic acidosis at 2.5 -UA is indicative of UTI -Continue Rocephin -Urine culture pending -Blood culture pending -Trend lactic acid again in the a.m. -Chest x-ray shows no active disease -No other signs or symptoms of infection    DVT right lower extremity Follow official report of lower extremity venous duplex Started heparin IV infusion as patient's hemoglobin is low, transition to oral anticoagulation when H&H remained stable Monitor PTT and CBC as per protocol Follow CTA chest to rule out PE and CTA abdomen and pelvis to rule out DVT in pelvic vessels  Anemia Hb dropped to 6.8 down from 8.1 several days ago -No signs or symptoms of bleeding -Likely related to chemotherapy -Consult Dr. Raliegh Ip -Continue to monitor 8/5 transfuse 1 unit of PRBC,  monitor H&H and transfuse if hemoglobin less than 7   Protein calorie malnutrition (HCC) - Albumin 2.3 -Secondary to poor p.o. intake -possible partial SBO on last CT, KUB was done negative for obstruction, stable soft tissue mass in the pelvis Continue full liquid diet and advance as per tolerance    Anasarca - Pitting edema to umbilicus -Likely related to obstruction of lymph system -Holding off on Lasix at this time as patient's pressures been soft with the lowest reading 99/78, in the setting of sepsis -SCDs -Continue to monitor   UTI (urinary tract infection) - UA indicative of UTI -Complaints of urinary frequency and urgency -There  is concern for pyelonephritis given right-sided obstructive uropathy due to the tumor -Continue Rocephin -See treatment plan for sepsis   GERD (gastroesophageal reflux disease) - Start PPI -Continue to monitor   Endometrial cancer ([place]) - Recently started new chemo -Not tolerating very well with abdominal pain, leg swelling, headaches -Dr. Raliegh Ip presentation to the ER -Consult Dr. Raliegh Ip -Hold lenvatinib at this time -Continue to monitor   Essential  hypertension - Blood pressures currently soft -No antihypertensives at this time   Body mass index is 34.96 kg/m.  Interventions:    Diet: Full liquid, advance as per tolerance DVT Prophylaxis: Therapeutic Anticoagulation with heparin IV infusion    Advance goals of care discussion: Full code  Family Communication: family was NOT present at bedside, at the time of interview.  The pt provided permission to discuss medical plan with the family. Opportunity was given to ask question and all questions were answered satisfactorily.   Disposition:  Pt is from Home, admitted with sepsis, UTI, anemia Hb 6.8 and found to have right lower extremity DVT, started on heparin IV infusion and transfuse 1 unit of PRBC, still sick , which precludes a safe discharge. Discharge to home, when medically improved, may need 3 to 5 days.  Subjective: No significant events overnight, patient was admitted due to abdominal pain, headache and lower extremity edema.  Headache has been resolved, abdominal pain is well controlled now, patient is still nauseous and vomiting.  Lower extremity edema is same.  Patient denies any chest pain or palpitations, no shortness of breath.   Physical Exam: General:  alert oriented to time, place, and person.  Appear in mild distress, affect depressed Eyes: PERRLA ENT: Oral Mucosa Clear, moist  Neck: no JVD,  Cardiovascular: S1 and S2 Present, no Murmur,  Respiratory: good respiratory effort, Bilateral Air entry equal and Decreased, no Crackles, no wheezes Abdomen: Bowel Sound present, Soft and mild tenderness,  Skin: no rashes Extremities: 4+ Pedal edema R>L, no calf tenderness Neurologic: without any new focal findings Gait not checked due to patient safety concerns  Vitals:   11/16/21 1218 11/16/21 1300 11/16/21 1323 11/16/21 1400  BP:  97/74 106/80 107/80  Pulse:  (!) 118 (!) 118 (!) 119  Resp:  16 (!) 22 (!) 22  Temp: 99 F (37.2 C)  98.6 F (37 C) 98.6 F (37  C)  TempSrc: Oral  Oral Oral  SpO2:  99% 99% 98%  Weight:      Height:        Intake/Output Summary (Last 24 hours) at 11/16/2021 1419 Last data filed at 11/16/2021 1300 Gross per 24 hour  Intake 2977.28 ml  Output 1180 ml  Net 1797.28 ml   Filed Weights   11/15/21 1557 11/15/21 2121 11/16/21 0349  Weight: 85.7 kg 86.3 kg 86.7 kg    Data Reviewed: I have personally reviewed and interpreted daily labs, tele strips, imagings as discussed above. I reviewed all nursing notes, pharmacy notes, vitals, pertinent old records I have discussed plan of care as described above with RN and patient/family.  CBC: Recent Labs  Lab 11/15/21 1650 11/16/21 0531  WBC 8.1 6.6  NEUTROABS  --  4.1  HGB 7.0* 6.8*  HCT 22.9* 21.6*  MCV 89.8 87.4  PLT 155 130   Basic Metabolic Panel: Recent Labs  Lab 11/15/21 1650 11/16/21 0531 11/16/21 0820  NA 135 134*  --   K 4.0 4.0  --   CL 102 103  --   CO2 23  24  --   GLUCOSE 110* 91  --   BUN 14 13  --   CREATININE 0.78 0.80  --   CALCIUM 8.4* 8.1*  --   MG  --  1.7  --   PHOS  --   --  3.5    Studies: Abd 1 View (KUB)  Result Date: 11/15/2021 CLINICAL DATA:  Abdominal distension EXAM: ABDOMEN - 1 VIEW COMPARISON:  10/25/2021 CT, plain film from 10/26/2021 FINDINGS: Scattered large and small bowel gas is noted. Significant soft tissue density is noted in the pelvis consistent with the known pelvic mass seen on prior CT examination. No bony abnormality is noted. No free air is seen. IMPRESSION: Stable soft tissue mass in the pelvis. No acute abnormality is noted. Electronically Signed   By: Inez Catalina M.D.   On: 11/15/2021 22:12   DG Chest Port 1 View  Result Date: 11/15/2021 CLINICAL DATA:  Abdominal pain, nausea, vomiting, patient on chemotherapy EXAM: PORTABLE CHEST 1 VIEW COMPARISON:  01/25/2021 FINDINGS: Cardiac size is within normal limits. Lung fields are clear of any infiltrates or pulmonary edema. There is no pleural effusion or  pneumothorax. Tip of right chest port is seen at the junction of superior vena cava and right atrium. IMPRESSION: No active disease. Electronically Signed   By: Elmer Picker M.D.   On: 11/15/2021 16:53    Scheduled Meds:  Chlorhexidine Gluconate Cloth  6 each Topical Daily   feeding supplement  237 mL Oral TID BM   gabapentin  300 mg Oral TID   pantoprazole (PROTONIX) IV  40 mg Intravenous Q24H   cyanocobalamin  500 mcg Oral Daily   Continuous Infusions:  cefTRIAXone (ROCEPHIN)  IV Stopped (11/15/21 1718)   heparin 1,150 Units/hr (11/16/21 1059)   lactated ringers 75 mL/hr at 11/16/21 1209   PRN Meds: acetaminophen **OR** acetaminophen, albuterol, HYDROcodone-acetaminophen, midodrine, morphine injection, ondansetron **OR** ondansetron (ZOFRAN) IV, oxyCODONE  Time spent: 55 minutes  Author: Val Riles. MD [facility] 11/16/2021 2:19 PM  To reach On-call, see care teams to locate the attending and reach out to them via [email or web address]. If 7PM-7AM, please contact night-coverage If you still have difficulty reaching the attending provider, please page the [facility] (Director on Call) for [facility] on amion for assistance.

## 2021-11-17 DIAGNOSIS — I824Y1 Acute embolism and thrombosis of unspecified deep veins of right proximal lower extremity: Secondary | ICD-10-CM | POA: Diagnosis not present

## 2021-11-17 DIAGNOSIS — A419 Sepsis, unspecified organism: Secondary | ICD-10-CM | POA: Diagnosis not present

## 2021-11-17 DIAGNOSIS — R601 Generalized edema: Secondary | ICD-10-CM | POA: Diagnosis not present

## 2021-11-17 DIAGNOSIS — D649 Anemia, unspecified: Secondary | ICD-10-CM | POA: Diagnosis not present

## 2021-11-17 LAB — TYPE AND SCREEN
ABO/RH(D): O POS
Antibody Screen: NEGATIVE
Unit division: 0

## 2021-11-17 LAB — HEPARIN LEVEL (UNFRACTIONATED): Heparin Unfractionated: 0.61 IU/mL (ref 0.30–0.70)

## 2021-11-17 LAB — PHOSPHORUS: Phosphorus: 3.2 mg/dL (ref 2.5–4.6)

## 2021-11-17 LAB — BASIC METABOLIC PANEL
Anion gap: 6 (ref 5–15)
BUN: 11 mg/dL (ref 8–23)
CO2: 25 mmol/L (ref 22–32)
Calcium: 7.9 mg/dL — ABNORMAL LOW (ref 8.9–10.3)
Chloride: 103 mmol/L (ref 98–111)
Creatinine, Ser: 0.77 mg/dL (ref 0.44–1.00)
GFR, Estimated: >60 mL/min (ref >60)
Glucose, Bld: 89 mg/dL (ref 70–99)
Potassium: 3.5 mmol/L (ref 3.5–5.1)
Sodium: 134 mmol/L — ABNORMAL LOW (ref 135–145)

## 2021-11-17 LAB — CBC
HCT: 27.7 % — ABNORMAL LOW (ref 36.0–46.0)
Hemoglobin: 8.8 g/dL — ABNORMAL LOW (ref 12.0–15.0)
MCH: 28 pg (ref 26.0–34.0)
MCHC: 31.8 g/dL (ref 30.0–36.0)
MCV: 88.2 fL (ref 80.0–100.0)
Platelets: 203 10*3/uL (ref 150–400)
RBC: 3.14 MIL/uL — ABNORMAL LOW (ref 3.87–5.11)
RDW: 18.1 % — ABNORMAL HIGH (ref 11.5–15.5)
WBC: 6.6 10*3/uL (ref 4.0–10.5)
nRBC: 0 % (ref 0.0–0.2)

## 2021-11-17 LAB — BPAM RBC
Blood Product Expiration Date: 202309092359
ISSUE DATE / TIME: 202308051338
Unit Type and Rh: 5100

## 2021-11-17 LAB — URINE CULTURE

## 2021-11-17 LAB — MAGNESIUM: Magnesium: 1.5 mg/dL — ABNORMAL LOW (ref 1.7–2.4)

## 2021-11-17 MED ORDER — POTASSIUM CHLORIDE CRYS ER 20 MEQ PO TBCR
40.0000 meq | EXTENDED_RELEASE_TABLET | ORAL | Status: AC
  Administered 2021-11-17 (×2): 40 meq via ORAL
  Filled 2021-11-17 (×2): qty 2

## 2021-11-17 MED ORDER — MAGNESIUM SULFATE 4 GM/100ML IV SOLN
4.0000 g | Freq: Once | INTRAVENOUS | Status: AC
  Administered 2021-11-17: 4 g via INTRAVENOUS
  Filled 2021-11-17: qty 100

## 2021-11-17 MED ORDER — BISACODYL 10 MG RE SUPP
10.0000 mg | Freq: Once | RECTAL | Status: AC
  Administered 2021-11-17: 10 mg via RECTAL
  Filled 2021-11-17: qty 1

## 2021-11-17 MED ORDER — ALBUMIN HUMAN 25 % IV SOLN
25.0000 g | Freq: Four times a day (QID) | INTRAVENOUS | Status: AC
  Administered 2021-11-17 – 2021-11-18 (×4): 25 g via INTRAVENOUS
  Filled 2021-11-17 (×4): qty 100

## 2021-11-17 MED ORDER — POLYETHYLENE GLYCOL 3350 17 G PO PACK
17.0000 g | PACK | Freq: Every day | ORAL | Status: DC
Start: 2021-11-17 — End: 2021-11-21
  Administered 2021-11-17 – 2021-11-20 (×4): 17 g via ORAL
  Filled 2021-11-17 (×5): qty 1

## 2021-11-17 MED ORDER — FUROSEMIDE 10 MG/ML IJ SOLN
20.0000 mg | Freq: Two times a day (BID) | INTRAMUSCULAR | Status: DC
  Administered 2021-11-17 – 2021-11-19 (×4): 20 mg via INTRAVENOUS
  Filled 2021-11-17 (×4): qty 2

## 2021-11-17 NOTE — Progress Notes (Signed)
[facility] Progress Note  Patient: Michele Romero    LKT:[id number]  DOA: 11/15/2021     Date of Service: the patient was seen and examined on 11/17/2021  Chief Complaint  Patient presents with   Abdominal Pain   Brief hospital course: Michele Romero is a 61 y.o. female with medical history significant of endometrial cancer, COPD, GERD, migraines, hypertension, and more presents the ED with a chief complaint of headaches, stomach cramping, leg swelling.  Patient reports that she started a new chemotherapy on Monday.  Her symptoms started on Tuesday.  She describes right-sided headaches.  No changes in vision.  Pain pills help and go away.  Dark room, that is quiet also helps but does not relieve the pain.  She has no associated asymptomatic weakness, no difficulty speaking, no difficulty swallowing.  She does report dizziness, but the dizziness seems to be orthostatic as it happens upon standing to go to the bathroom etc.  She describes left-sided abdominal pain.  Last bowel movement was this morning.  It was normal.  Her abdominal pain is intermittent.  P.o. intake makes her nauseous which makes the pain worse.  She reports it comes on consistently about an hour after she takes her chemo pill.  She has had no melena or hematochezia.  She has leg swelling that is been going on for a while but has been progressively worse over the last 5 days.  She has no pain in her legs, no erythema.  She does describe a cramping sensation in her legs, but does not cause pain.  She had no weeping.  Patient denies chest pain, palpitations, shortness of breath.  She denies dysuria but admits to urinary urgency and frequency.  No hematuria.  Patient has no other complaints at this time.   Patient does not smoke, does not drink alcohol, does use marijuana occasionally.  The last use was approximately 1 week ago.  She is not vaccinated for COVID.  Patient is full code.   Assessment and Plan: Sepsis ([place]) - Heart  rate of 220, respiratory rate 22, lactic acidosis at 2.5 -UA is indicative of UTI -Continue Rocephin -Urine culture with multiple species -Blood culture pending -Trend lactic acid again in the a.m. -Chest x-ray shows no active disease -No other signs or symptoms of infection    DVT right lower extremity Venous Dopplers positive for right common femoral DVT Started heparin IV infusion as patient's hemoglobin is low, transition to oral anticoagulation when H&H remained stable CTA chest negative for pulmonary embolus  Anemia Hb dropped to 6.8 down from 8.1 several days ago -No signs or symptoms of bleeding -Likely related to chemotherapy -Consult Dr. Raliegh Ip -Continue to monitor 8/5 transfuse 1 unit of PRBC -Follow-up hemoglobin improved to 8.8 monitor H&H and transfuse if hemoglobin less than 7   Protein calorie malnutrition (HCC) - Albumin 2.0 -Secondary to poor p.o. intake -possible partial SBO on last CT, KUB was done negative for obstruction, stable soft tissue mass in the pelvis Continue full liquid diet and advance as per tolerance  Constipation -Reports no bowel movement for the last 4 days -We will provide suppository    Anasarca - Pitting edema to umbilicus -Likely related to hypoalbuminemia -Provide albumin infusion with low-dose IV Lasix -SCDs -Continue to monitor   UTI (urinary tract infection) - UA indicative of UTI -Complaints of urinary frequency and urgency -There is concern for pyelonephritis given right-sided obstructive uropathy due to the tumor -Continue Rocephin -See treatment plan  for sepsis   GERD (gastroesophageal reflux disease) - Start PPI -Continue to monitor   Endometrial cancer ([place]) - Recently started new chemo -Not tolerating very well with abdominal pain, leg swelling, headaches -CT scan done on admission shows further enlargement of pelvic mass -We will inform Dr. Delton Coombes -Hold lenvatinib at this time -Continue to  monitor -Palliative care consulted for goals of care  Right-sided hydronephrosis -Felt to be secondary to compression from pelvic mass -Chronic finding -Renal function currently stable -Continue to follow clinically  Essential hypertension - Blood pressures currently soft -No antihypertensives at this time   Body mass index is 34.96 kg/m.  Interventions:    Diet: Full liquid, advance as per tolerance DVT Prophylaxis: Therapeutic Anticoagulation with heparin IV infusion    Advance goals of care discussion: Full code.  Requested palliative care consultation to help further address goals of care  Family Communication: family was NOT present at bedside, at the time of interview.  The pt provided permission to discuss medical plan with the family. Opportunity was given to ask question and all questions were answered satisfactorily.   Subjective: She is chronic lower abdominal pain.  Does not have any shortness of breath.  She did have some vomiting earlier this morning.  Has not had a bowel movement in several days.  She is passing gas.   Physical Exam: General:  alert oriented to time, place, and person.  Appear in mild distress, affect depressed Eyes: PERRLA ENT: Oral Mucosa Clear, moist  Neck: no JVD,  Cardiovascular: S1 and S2 Present, no Murmur,  Respiratory: good respiratory effort, Bilateral Air entry equal and Decreased, no Crackles, no wheezes Abdomen: Bowel Sound present, Soft and mild tenderness,  Skin: no rashes Extremities: 2+ Pedal edema R>L, no calf tenderness Neurologic: without any new focal findings Gait not checked due to patient safety concerns  Vitals:   11/17/21 1500 11/17/21 1600 11/17/21 1700 11/17/21 1800  BP: 116/80 103/68 110/75 112/77  Pulse: (!) 120 (!) 118 (!) 118 (!) 122  Resp: '20 20 17 19  '$ Temp: 97.7 F (36.5 C)     TempSrc:      SpO2: 100% 100% 99% 100%  Weight:      Height:        Intake/Output Summary (Last 24 hours) at 11/17/2021  1835 Last data filed at 11/17/2021 1800 Gross per 24 hour  Intake 1162.83 ml  Output 1200 ml  Net -37.17 ml   Filed Weights   11/15/21 1557 11/15/21 2121 11/16/21 0349  Weight: 85.7 kg 86.3 kg 86.7 kg    Data Reviewed: I have personally reviewed and interpreted daily labs, tele strips, imagings as discussed above. I reviewed all nursing notes, pharmacy notes, vitals, pertinent old records I have discussed plan of care as described above with RN and patient/family.  CBC: Recent Labs  Lab 11/15/21 1650 11/16/21 0531 11/16/21 1702 11/17/21 0514  WBC 8.1 6.6  --  6.6  NEUTROABS  --  4.1  --   --   HGB 7.0* 6.8* 8.6* 8.8*  HCT 22.9* 21.6* 26.5* 27.7*  MCV 89.8 87.4  --  88.2  PLT 155 173  --  295   Basic Metabolic Panel: Recent Labs  Lab 11/15/21 1650 11/16/21 0531 11/16/21 0820 11/17/21 0514  NA 135 134*  --  134*  K 4.0 4.0  --  3.5  CL 102 103  --  103  CO2 23 24  --  25  GLUCOSE 110* 91  --  89  BUN 14 13  --  11  CREATININE 0.78 0.80  --  0.77  CALCIUM 8.4* 8.1*  --  7.9*  MG  --  1.7  --  1.5*  PHOS  --   --  3.5 3.2    Studies: No results found.  Scheduled Meds:  Chlorhexidine Gluconate Cloth  6 each Topical Daily   feeding supplement  237 mL Oral TID BM   furosemide  20 mg Intravenous BID   gabapentin  300 mg Oral TID   pantoprazole (PROTONIX) IV  40 mg Intravenous Q24H   potassium chloride  40 mEq Oral Q4H   cyanocobalamin  500 mcg Oral Daily   Continuous Infusions:  albumin human 25 g (11/17/21 1706)   cefTRIAXone (ROCEPHIN)  IV 2 g (11/17/21 1652)   heparin 1,150 Units/hr (11/17/21 0525)   magnesium sulfate bolus IVPB 4 g (11/17/21 1730)   PRN Meds: acetaminophen **OR** acetaminophen, albuterol, HYDROcodone-acetaminophen, midodrine, morphine injection, ondansetron **OR** ondansetron (ZOFRAN) IV, oxyCODONE  Time spent: 35 minutes  Author:  Kathie Dike, MD  [facility] 11/17/2021 6:35 PM  To reach On-call, see care teams to locate  the attending and reach out to them via [email or web address]. If 7PM-7AM, please contact night-coverage If you still have difficulty reaching the attending provider, please page the [facility] (Director on Call) for [facility] on amion for assistance.

## 2021-11-17 NOTE — Progress Notes (Signed)
ANTICOAGULATION CONSULT NOTE - Initial Consult  Pharmacy Consult for heparin Indication: DVT  Allergies  Allergen Reactions   Ivp Dye [Iodinated Contrast Media] Nausea And Vomiting    Patient had an episode of projectile vomiting after IV injection. Patient states it happens every time she has IV contrast.     Codeine Nausea And Vomiting and Rash    Patient Measurements: Height: '5\' 2"'$  (157.5 cm) Weight: 86.7 kg (191 lb 2.2 oz) IBW/kg (Calculated) : 50.1 Heparin Dosing Weight: 70 kg  Vital Signs: Temp: 99.3 F (37.4 C) (08/05 2000) Temp Source: Oral (08/05 2000) BP: 83/63 (08/06 0600) Pulse Rate: 99 (08/06 0630)  Labs: Recent Labs    11/15/21 1650 11/16/21 0531 11/16/21 1702 11/16/21 2310 11/17/21 0514  HGB 7.0* 6.8* 8.6*  --  8.8*  HCT 22.9* 21.6* 26.5*  --  27.7*  PLT 155 173  --   --  203  APTT 36  --   --   --   --   LABPROT 17.3* 17.6*  --   --   --   INR 1.4* 1.5*  --   --   --   HEPARINUNFRC  --   --  0.61 0.58 0.61  CREATININE 0.78 0.80  --   --  0.77     Estimated Creatinine Clearance: 74.5 mL/min (by C-G formula based on SCr of 0.77 mg/dL).   Medical History: Past Medical History:  Diagnosis Date   Ambulates with cane 10/31/2020   Arthritis    RIGHT HIP   Asthma    Cigarette nicotine dependence without complication 97/98/9211   Complex tear of medial meniscus of right knee 01/20/2017   COPD (chronic obstructive pulmonary disease) ([place])    ENDOMETRIAL CANCER 10/31/2020   Endometrial cancer ([place]) 11/15/2020   GERD (gastroesophageal reflux disease)    History of radiation therapy    HDR brachytherapy [place] 05/08/2021-06/03/2021  Dr Gery Pray   Migraines    Port-A-Cath in place 01/01/2021    Medications:  Medications Prior to Admission  Medication Sig Dispense Refill Last Dose   acetaminophen (TYLENOL) 500 MG tablet Take 1,000 mg by mouth every 6 (six) hours as needed for moderate pain.   UNKNOWN   albuterol (VENTOLIN HFA) 108 (90 Base)  MCG/ACT inhaler Inhale 2 puffs into the lungs every 6 (six) hours as needed for wheezing or shortness of breath. 8 g 2 UNKNOWN   gabapentin (NEURONTIN) 300 MG capsule Take 1 capsule (300 mg total) by mouth 3 (three) times daily. Take additional capsule at bed time for total dose '600mg'$  120 capsule 6 11/15/2021   HYDROcodone-acetaminophen (NORCO/VICODIN) 5-325 MG tablet Take 1 tablet by mouth every 6 hours as needed for moderate pain. 60 tablet 0 11/14/2021   lenvatinib 14 mg daily dose (LENVIMA) 10 & 4 MG capsule Take 14 mg by mouth daily. 60 capsule 3 11/15/2021   loperamide (IMODIUM) 2 MG capsule Take 1 capsule (2 mg total) by mouth as needed for diarrhea or loose stools (Take 2 Capsules after the 1st loose stool and then 1 capsule adter each loose stool. Do not exceed 8 capsules in a 24-hr period. If it is bedtime and you are having loose stools, take 2 capsules at bestime and then take 2 capsules every 4 hours until morning.). 30 capsule 0 UNKNOWN   magnesium oxide (MAG-OX) 400 (240 Mg) MG tablet Take 1 tablet (400 mg total) by mouth in the morning, at noon, and at bedtime. 90 tablet 6 11/15/2021  vitamin B-12 (CYANOCOBALAMIN) 500 MCG tablet Take 500 mcg by mouth daily.   UNKNOWN   prochlorperazine (COMPAZINE) 10 MG tablet Take 1 tablet (10 mg total) by mouth every 6 (six) hours as needed for nausea or vomiting. (Patient not taking: Reported on 11/07/2021) 60 tablet 1 Not Taking    Assessment: Pharmacy consulted to dose heparin in patient with DVT confirmed by [place].  CT angio pending for PE.  Patient is not on anticoagulation prior to admission.  Heparin level - 0.61: remains therapeutic    Hgb 8.8  Goal of Therapy:  Heparin level 0.3-0.7 units/ml Monitor platelets by anticoagulation protocol: Yes   Plan:  Continue heparin infusion at 1150 units/hr Monitor daily heparin level and CBC, s/sx bleeding F/u long-term anticoagulation plan  Margot Ables, PharmD Clinical Pharmacist 11/17/2021 7:41  AM

## 2021-11-18 ENCOUNTER — Inpatient Hospital Stay (HOSPITAL_COMMUNITY): Payer: 59

## 2021-11-18 DIAGNOSIS — Z515 Encounter for palliative care: Secondary | ICD-10-CM

## 2021-11-18 DIAGNOSIS — Z7189 Other specified counseling: Secondary | ICD-10-CM | POA: Diagnosis not present

## 2021-11-18 DIAGNOSIS — A419 Sepsis, unspecified organism: Secondary | ICD-10-CM | POA: Diagnosis not present

## 2021-11-18 DIAGNOSIS — R601 Generalized edema: Secondary | ICD-10-CM | POA: Diagnosis not present

## 2021-11-18 DIAGNOSIS — C541 Malignant neoplasm of endometrium: Secondary | ICD-10-CM | POA: Diagnosis not present

## 2021-11-18 DIAGNOSIS — N390 Urinary tract infection, site not specified: Secondary | ICD-10-CM | POA: Diagnosis not present

## 2021-11-18 DIAGNOSIS — D649 Anemia, unspecified: Secondary | ICD-10-CM | POA: Diagnosis not present

## 2021-11-18 DIAGNOSIS — I824Y1 Acute embolism and thrombosis of unspecified deep veins of right proximal lower extremity: Secondary | ICD-10-CM | POA: Diagnosis not present

## 2021-11-18 LAB — MAGNESIUM: Magnesium: 2.2 mg/dL (ref 1.7–2.4)

## 2021-11-18 LAB — CBC
HCT: 25.3 % — ABNORMAL LOW (ref 36.0–46.0)
Hemoglobin: 7.9 g/dL — ABNORMAL LOW (ref 12.0–15.0)
MCH: 27.7 pg (ref 26.0–34.0)
MCHC: 31.2 g/dL (ref 30.0–36.0)
MCV: 88.8 fL (ref 80.0–100.0)
Platelets: 194 10*3/uL (ref 150–400)
RBC: 2.85 MIL/uL — ABNORMAL LOW (ref 3.87–5.11)
RDW: 18.2 % — ABNORMAL HIGH (ref 11.5–15.5)
WBC: 7.4 10*3/uL (ref 4.0–10.5)
nRBC: 0 % (ref 0.0–0.2)

## 2021-11-18 LAB — HEPARIN LEVEL (UNFRACTIONATED): Heparin Unfractionated: 0.34 IU/mL (ref 0.30–0.70)

## 2021-11-18 LAB — RENAL FUNCTION PANEL
Albumin: 2.8 g/dL — ABNORMAL LOW (ref 3.5–5.0)
Anion gap: 7 (ref 5–15)
BUN: 12 mg/dL (ref 8–23)
CO2: 25 mmol/L (ref 22–32)
Calcium: 8.3 mg/dL — ABNORMAL LOW (ref 8.9–10.3)
Chloride: 104 mmol/L (ref 98–111)
Creatinine, Ser: 0.78 mg/dL (ref 0.44–1.00)
GFR, Estimated: >60 mL/min (ref >60)
Glucose, Bld: 94 mg/dL (ref 70–99)
Phosphorus: 2.6 mg/dL (ref 2.5–4.6)
Potassium: 4.2 mmol/L (ref 3.5–5.1)
Sodium: 136 mmol/L (ref 135–145)

## 2021-11-18 MED ORDER — OXYCODONE HCL 5 MG PO TABS: 5.0000 mg | ORAL_TABLET | ORAL | Status: DC | PRN

## 2021-11-18 MED ORDER — BISACODYL 10 MG RE SUPP
10.0000 mg | Freq: Once | RECTAL | Status: AC
Start: 2021-11-18 — End: 2021-11-18
  Administered 2021-11-18: 10 mg via RECTAL
  Filled 2021-11-18: qty 1

## 2021-11-18 NOTE — Progress Notes (Signed)
[facility] Progress Note  Patient: Michele Romero    BJS:[id number]  DOA: 11/15/2021     Date of Service: the patient was seen and examined on 11/18/2021  Chief Complaint  Patient presents with   Abdominal Pain   Brief hospital course: Michele Romero is a 61 y.o. female with medical history significant of endometrial cancer, COPD, GERD, migraines, hypertension, and more presents the ED with a chief complaint of headaches, stomach cramping, leg swelling.  Patient reports that she started a new chemotherapy on Monday.  Her symptoms started on Tuesday.  She describes right-sided headaches.  No changes in vision.  Pain pills help and go away.  Dark room, that is quiet also helps but does not relieve the pain.  She has no associated asymptomatic weakness, no difficulty speaking, no difficulty swallowing.  She does report dizziness, but the dizziness seems to be orthostatic as it happens upon standing to go to the bathroom etc.  She describes left-sided abdominal pain.  Last bowel movement was this morning.  It was normal.  Her abdominal pain is intermittent.  P.o. intake makes her nauseous which makes the pain worse.  She reports it comes on consistently about an hour after she takes her chemo pill.  She has had no melena or hematochezia.  She has leg swelling that is been going on for a while but has been progressively worse over the last 5 days.  She has no pain in her legs, no erythema.  She does describe a cramping sensation in her legs, but does not cause pain.  She had no weeping.  Patient denies chest pain, palpitations, shortness of breath.  She denies dysuria but admits to urinary urgency and frequency.  No hematuria.  Patient has no other complaints at this time.   Patient does not smoke, does not drink alcohol, does use marijuana occasionally.  The last use was approximately 1 week ago.  She is not vaccinated for COVID.  Patient is full code.   Assessment and Plan: Sepsis ([place]) - On  admission: Heart rate of 120, respiratory rate 22, lactic acidosis at 2.5 -UA is indicative of UTI -Continue Rocephin -Urine culture with multiple species -Blood culture with no growth thus far -Chest x-ray shows no active disease -No other signs or symptoms of infection    DVT right lower extremity Venous Dopplers positive for right common femoral DVT Started heparin IV infusion as patient's hemoglobin is low Will discuss with oncology transitioning to oral anticoag vs. lovenox CTA chest negative for pulmonary embolus  Anemia Hb dropped to 6.8 down from 8.1 several days ago -No signs or symptoms of bleeding -Likely related to chemotherapy -Consult Dr. Raliegh Ip -Continue to monitor 8/5 transfuse 1 unit of PRBC -Follow-up hemoglobin improved to 7.9 monitor H&H and transfuse if hemoglobin less than 7   Protein calorie malnutrition (H[facility]) - Albumin 2.0 -Secondary to poor p.o. intake -receiving albumin infusions -encourage po intake  Constipation -Reports no bowel movement for several days -suppository on 8/6 with small BM per patient -will repeat today -continue miralax    Anasarca -Pitting edema to umbilicus -Likely related to hypoalbuminemia -Provide albumin infusion with low-dose IV Lasix -SCDs -Continue to monitor   UTI (urinary tract infection) - UA indicative of UTI -Complaints of urinary frequency and urgency -There is concern for pyelonephritis given right-sided obstructive uropathy due to the tumor -Continue Rocephin -See treatment plan for sepsis   GERD (gastroesophageal reflux disease) - Start PPI -Continue to monitor  Endometrial cancer ([place]) - Recently started new chemo -Not tolerating very well with abdominal pain, leg swelling, headaches -CT scan done on admission shows further enlargement of pelvic mass -We will inform Dr. Delton Coombes -Hold lenvatinib at this time -Continue to monitor -Palliative care consulted for goals of care  Right-sided  hydronephrosis -Felt to be secondary to compression from pelvic mass -Chronic finding -Renal function currently stable -Continue to follow clinically  Essential hypertension - Blood pressures currently soft -No antihypertensives at this time   Body mass index is 34.96 kg/m.  Interventions:    Diet: Full liquid, advance to soft diet DVT Prophylaxis: Therapeutic Anticoagulation with heparin IV infusion    Advance goals of care discussion: Full code.  Requested palliative care consultation to help further address goals of care  Family Communication: family was NOT present at bedside, at the time of interview.  The pt provided permission to discuss medical plan with the family. Opportunity was given to ask question and all questions were answered satisfactorily.   Subjective: She has some nausea, no vomiting. Had a small bowel movement yesterday. She is passing gas. Receiving pain meds for abd pain   Physical Exam: General:  alert oriented to time, place, and person.  Appear in mild distress, affect depressed Eyes: PERRLA ENT: Oral Mucosa Clear, moist  Neck: no JVD,  Cardiovascular: S1 and S2 Present, tachycardic, no Murmur,  Respiratory: good respiratory effort, Bilateral Air entry equal and Decreased, no Crackles, no wheezes Abdomen: Bowel Sound present, Soft and mild tenderness,  Skin: no rashes Extremities: 2+ Pedal edema R>L, no calf tenderness Neurologic: without any new focal findings Gait not checked due to patient safety concerns  Vitals:   11/18/21 0435 11/18/21 0728 11/18/21 1055 11/18/21 1132  BP:      Pulse:  (!) 111 (!) 101 (!) 115  Resp:  20 (!) 55 19  Temp:  97.8 F (36.6 C)  98.2 F (36.8 C)  TempSrc:  Oral  Oral  SpO2:  100% 100% 97%  Weight: 88.7 kg     Height:        Intake/Output Summary (Last 24 hours) at 11/18/2021 1207 Last data filed at 11/17/2021 2300 Gross per 24 hour  Intake 281.15 ml  Output 1550 ml  Net -1268.85 ml   Filed Weights    11/15/21 2121 11/16/21 0349 11/18/21 0435  Weight: 86.3 kg 86.7 kg 88.7 kg    Data Reviewed: I have personally reviewed and interpreted daily labs, tele strips, imagings as discussed above. I reviewed all nursing notes, pharmacy notes, vitals, pertinent old records I have discussed plan of care as described above with RN and patient/family.  CBC: Recent Labs  Lab 11/15/21 1650 11/16/21 0531 11/16/21 1702 11/17/21 0514 11/18/21 0605  WBC 8.1 6.6  --  6.6 7.4  NEUTROABS  --  4.1  --   --   --   HGB 7.0* 6.8* 8.6* 8.8* 7.9*  HCT 22.9* 21.6* 26.5* 27.7* 25.3*  MCV 89.8 87.4  --  88.2 88.8  PLT 155 173  --  203 952   Basic Metabolic Panel: Recent Labs  Lab 11/15/21 1650 11/16/21 0531 11/16/21 0820 11/17/21 0514 11/18/21 0605  NA 135 134*  --  134* 136  K 4.0 4.0  --  3.5 4.2  CL 102 103  --  103 104  CO2 23 24  --  25 25  GLUCOSE 110* 91  --  89 94  BUN 14 13  --  11 12  CREATININE 0.78 0.80  --  0.77 0.78  CALCIUM 8.4* 8.1*  --  7.9* 8.3*  MG  --  1.7  --  1.5* 2.2  PHOS  --   --  3.5 3.2 2.6    Studies: No results found.  Scheduled Meds:  bisacodyl  10 mg Rectal Once   Chlorhexidine Gluconate Cloth  6 each Topical Daily   feeding supplement  237 mL Oral TID BM   furosemide  20 mg Intravenous BID   gabapentin  300 mg Oral TID   pantoprazole (PROTONIX) IV  40 mg Intravenous Q24H   polyethylene glycol  17 g Oral Daily   cyanocobalamin  500 mcg Oral Daily   Continuous Infusions:  cefTRIAXone (ROCEPHIN)  IV 2 g (11/17/21 1652)   heparin 1,150 Units/hr (11/18/21 0313)   PRN Meds: acetaminophen **OR** acetaminophen, albuterol, HYDROcodone-acetaminophen, midodrine, morphine injection, ondansetron **OR** ondansetron (ZOFRAN) IV, oxyCODONE  Time spent: 35 minutes  Author:  Kathie Dike, MD  [facility] 11/18/2021 12:07 PM  To reach On-call, see care teams to locate the attending and reach out to them via [email or web address]. If 7PM-7AM, please contact  night-coverage If you still have difficulty reaching the attending provider, please page the [facility] (Director on Call) for [facility] on amion for assistance.

## 2021-11-18 NOTE — Consult Note (Signed)
Palliative Care Consult Note                                  Date: 11/18/2021   Patient Name: Michele Romero  DOB: [birth year]  MRN: [id number]  Age / Sex: 61 y.o., female  PCP: Patient, No Pcp Per Referring Physician: Erick Blinks, MD  Reason for Consultation: Establishing goals of care  HPI/Patient Profile: 61 y.o. female  with past medical history of endometrial cancer, COPD, GERD, migraines, hypertension, and more presents the ED with a chief complaint of headaches, stomach cramping, leg swelling. Recently started new chemo for endometrial cancer s/p hysterectomy. She was admitted on 11/15/2021 with sepsis, DVT RLE, malnutrition, constipation, anasarca, endometrial cancer.   PMT was consulted for GOC conversations.  Past Medical History:  Diagnosis Date  . Ambulates with cane 10/31/2020  . Arthritis    RIGHT HIP  . Asthma   . Cigarette nicotine dependence without complication 06/25/2015  . Complex tear of medial meniscus of right knee 01/20/2017  . COPD (chronic obstructive pulmonary disease) ([facility])   . ENDOMETRIAL CANCER 10/31/2020  . Endometrial cancer ([facility]) 11/15/2020  . GERD (gastroesophageal reflux disease)   . History of radiation therapy    HDR brachytherapy [facility] 05/08/2021-06/03/2021  Dr Antony Blackbird  . Migraines   . Port-A-Cath in place 01/01/2021    Subjective:   This NP [name] reviewed medical records, received report from team, assessed the patient and then meet at the patient's bedside to discuss diagnosis, prognosis, GOC, EOL wishes disposition and options.  I met with the patient at bedside.  No family was present..   Concept of Palliative Care was introduced as specialized medical care for people and their families living with serious illness.  If focuses on providing relief from the symptoms and stress of a serious illness.  The goal is to improve quality of life for both the patient and the family. Values  and goals of care important to patient and family were attempted to be elicited.  Created space and opportunity for patient  and family to explore thoughts and feelings regarding current medical situation   Natural trajectory and current clinical status were discussed. Questions and concerns addressed. Patient  encouraged to call with questions or concerns.    Patient/Family Understanding of Illness: She understands that her cancer is growing.  She also knows that she has a blood clot in her leg.  Overall, recently, she states she has had good days and bad days.  We took time to further explore her chronic and acute medical situations including sepsis, DVT requiring a heparin drip, anemia likely related to her chemotherapy, swelling likely related to low albumin, and the recurrent, aggressive endometrial cancer with enlarging tumor.  We discussed treatments for her current situations including antibiotics for sepsis, heparin for the DVT.  She has received a unit of blood.  Oncology will hopefully be coming to visit her soon.  She is receiving albumin infusions as well to help with her hypoalbuminemia and edema.  We also discussed that it appears she is not tolerating her chemotherapy regimen currently.  She agrees with this.  Life Review: The patient has 2 children, is not currently married.  She has a granddaughter's and smiles profoundly when she talks about them.  It is clear they bring her joy.  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  She enjoyed this line of work.  Patient Values: Being active, independence, family especially her grandbabies, and her faith  Goals: We began discussions on CODE STATUS, and other goals.  No decisions were made today.  Today's Discussion: In addition to the life review and discussion described above we had further substantial conversation on multiple topic areas.  We explored some reality related to her cancer.   Given that she has started second line treatment, which she is not tolerating well, in addition to the fact that her first-line treatment had to be stopped slightly prematurely due to severe neuropathy she does not appear to be tolerating her treatments.  Despite her treatments, her cancer has grown exponentially.  We discussed that her current regimen including anticipated start of Keytruda immunotherapy is noncurative and with a palliative intent.  We explored what palliative cancer treatment means including symptom relief and attempts to buy some time for quality.  She states that today she feels weak.  She did have some nausea but no vomiting.  She had a small bowel movement yesterday.  She feels like she is getting a little better with the swelling.  Noted persistent tachycardia.  Hospitalist plans to advance her diet to soft today to see if she can tolerate this.  While talking about food she states her favorite food are hamburgers and tacos.  Her son often brings her tacos from [employer] in [place], [place].  She shared with me that the first person she help care for was her younger sister who passed away with cancer in her early 72s.  She discussed how her sister kept having surgery and attempts to "cure the cancer" even though her mother implored her to stop being "cut on all the time" because it was clear to her it was not helping.  We discussed this in the larger context of her cancer noting that sometimes treatments can be offered but may not be very beneficial.  She seems to understand this.  Currently she does not have any surgical options.  When I mention this, it does not seem that she would want surgery again anyway.  We agreed that we would meet again tomorrow to have further discussions, hopefully including her family.  I discussed that Dr. Delton Coombes from oncology will hopefully be visiting her sometime this afternoon.  I provided emotional and general support through  therapeutic listening, empathy, sharing of stories, and other techniques. I answered all questions and addressed all concerns to the best of my ability.   Review of Systems  Constitutional:  Positive for fatigue.  Respiratory:  Negative for cough and shortness of breath.   Gastrointestinal:  Positive for abdominal pain (Improved with medications) and nausea. Negative for vomiting.  Neurological:  Positive for weakness.    Objective:   Primary Diagnoses: Present on Admission: . Sepsis ([place]) . Anemia . Endometrial cancer ([place]) . GERD (gastroesophageal reflux disease) . Essential hypertension   Physical Exam Vitals and nursing note reviewed.  Constitutional:      General: She is not in acute distress.    Appearance: She is ill-appearing.  HENT:     Head: Normocephalic and atraumatic.  Cardiovascular:     Rate and Rhythm: Tachycardia present.  Pulmonary:     Effort: Pulmonary effort is normal. No respiratory distress.  Abdominal:     General: Bowel sounds are normal.  Skin:    General: Skin is warm and dry.  Neurological:  Mental Status: She is alert and oriented to person, place, and time.     Motor: Weakness present.  Psychiatric:        Mood and Affect: Mood normal.        Behavior: Behavior normal.     Vital Signs:  BP 107/76   Pulse (!) 115   Temp 98.2 F (36.8 C) (Oral)   Resp 19   Ht 5' 2" (1.575 m)   Wt 88.7 kg   LMP 04/14/2010   SpO2 97%   BMI 35.77 kg/m   Palliative Assessment/Data: 40%    Advanced Care Planning:   Primary Decision Maker: PATIENT  Code Status/Advance Care Planning: Full code  A discussion was had today regarding advanced directives. Concepts specific to code status, artifical feeding and hydration, continued IV antibiotics and rehospitalization was had.  The difference between a aggressive medical intervention path and a palliative comfort care path for this patient at this time was had.  Decisions/Changes to ACP: None  today  Assessment & Plan:   Impression: 61 year old female with an unfortunate situation of profoundly enlarging endometrial cancer despite surgical resection, first-line treatment (stop prematurely due to severe neuropathy) and now on second line treatment oral chemotherapy, pending starting Keytruda.  Her current treatment is deemed palliative in nature.  We had a good discussion to help her understand her current situation.  I feel that there are likely no options for care and that she may not have very long.  We began discussions on CODE STATUS, goals.  We plan to continue these in the coming days with her family involvement.  Oncology will hopefully see her today and offer their opinion on recommendations for any remaining possible treatments.  I do not think she would tolerate treatment very well at this point given her poor functional status and weakness.  Overall prognosis poor.  SUMMARY OF RECOMMENDATIONS   Continue current treatments Remain full code for now Await oncology insight and opinion Continued support of patient and family Further GOC conversations in the coming days PMT will continue to follow.  Symptom Management:  Per primary team PMT is available to assist as needed  Prognosis:  Unable to determine  Discharge Planning:  To Be Determined   Discussed with: Medical team, nursing team, patient    Thank you for allowing [place] to participate in the care of Michele Romero PMT will continue to support holistically.  Time Total: 120 min  Greater than 50%  of this time was spent counseling and coordinating care related to the above assessment and plan.  Signed by: Walden Field, NP Palliative Medicine Team  Team Phone # [phone] (Nights/Weekends)  11/18/2021, 1:23 PM

## 2021-11-18 NOTE — Progress Notes (Signed)
ANTICOAGULATION CONSULT NOTE   Pharmacy Consult for heparin Indication: DVT  Allergies  Allergen Reactions   Ivp Dye [Iodinated Contrast Media] Nausea And Vomiting    Patient had an episode of projectile vomiting after IV injection. Patient states it happens every time she has IV contrast.     Codeine Nausea And Vomiting and Rash    Patient Measurements: Height: '5\' 2"'$  (157.5 cm) Weight: 88.7 kg (195 lb 8.8 oz) IBW/kg (Calculated) : 50.1 Heparin Dosing Weight: 70 kg  Vital Signs: Temp: 97.8 F (36.6 C) (08/07 0728) Temp Source: Oral (08/07 0728) BP: 107/76 (08/07 0200) Pulse Rate: 111 (08/07 0728)  Labs: Recent Labs    11/15/21 1650 11/16/21 0531 11/16/21 0531 11/16/21 1702 11/16/21 2310 11/17/21 0514 11/18/21 0605  HGB 7.0* 6.8*  --  8.6*  --  8.8* 7.9*  HCT 22.9* 21.6*  --  26.5*  --  27.7* 25.3*  PLT 155 173  --   --   --  203 194  APTT 36  --   --   --   --   --   --   LABPROT 17.3* 17.6*  --   --   --   --   --   INR 1.4* 1.5*  --   --   --   --   --   HEPARINUNFRC  --   --    < > 0.61 0.58 0.61 0.34  CREATININE 0.78 0.80  --   --   --  0.77 0.78   < > = values in this interval not displayed.     Estimated Creatinine Clearance: 75.4 mL/min (by C-G formula based on SCr of 0.78 mg/dL).   Medical History: Past Medical History:  Diagnosis Date   Ambulates with cane 10/31/2020   Arthritis    RIGHT HIP   Asthma    Cigarette nicotine dependence without complication 19/37/9024   Complex tear of medial meniscus of right knee 01/20/2017   COPD (chronic obstructive pulmonary disease) ([place])    ENDOMETRIAL CANCER 10/31/2020   Endometrial cancer ([place]) 11/15/2020   GERD (gastroesophageal reflux disease)    History of radiation therapy    HDR brachytherapy [place] 05/08/2021-06/03/2021  Dr Gery Pray   Migraines    Port-A-Cath in place 01/01/2021    Medications:  Medications Prior to Admission  Medication Sig Dispense Refill Last Dose   acetaminophen (TYLENOL)  500 MG tablet Take 1,000 mg by mouth every 6 (six) hours as needed for moderate pain.   UNKNOWN   albuterol (VENTOLIN HFA) 108 (90 Base) MCG/ACT inhaler Inhale 2 puffs into the lungs every 6 (six) hours as needed for wheezing or shortness of breath. 8 g 2 UNKNOWN   gabapentin (NEURONTIN) 300 MG capsule Take 1 capsule (300 mg total) by mouth 3 (three) times daily. Take additional capsule at bed time for total dose '600mg'$  120 capsule 6 11/15/2021   HYDROcodone-acetaminophen (NORCO/VICODIN) 5-325 MG tablet Take 1 tablet by mouth every 6 hours as needed for moderate pain. 60 tablet 0 11/14/2021   lenvatinib 14 mg daily dose (LENVIMA) 10 & 4 MG capsule Take 14 mg by mouth daily. 60 capsule 3 11/15/2021   loperamide (IMODIUM) 2 MG capsule Take 1 capsule (2 mg total) by mouth as needed for diarrhea or loose stools (Take 2 Capsules after the 1st loose stool and then 1 capsule adter each loose stool. Do not exceed 8 capsules in a 24-hr period. If it is bedtime and you are having loose  stools, take 2 capsules at bestime and then take 2 capsules every 4 hours until morning.). 30 capsule 0 UNKNOWN   magnesium oxide (MAG-OX) 400 (240 Mg) MG tablet Take 1 tablet (400 mg total) by mouth in the morning, at noon, and at bedtime. 90 tablet 6 11/15/2021   vitamin B-12 (CYANOCOBALAMIN) 500 MCG tablet Take 500 mcg by mouth daily.   UNKNOWN   prochlorperazine (COMPAZINE) 10 MG tablet Take 1 tablet (10 mg total) by mouth every 6 (six) hours as needed for nausea or vomiting. (Patient not taking: Reported on 11/07/2021) 60 tablet 1 Not Taking    Assessment: Pharmacy consulted to dose heparin in patient with DVT confirmed by [place].  CT angio pending for PE.  Patient is not on anticoagulation prior to admission.  Heparin level - 0.34: remains therapeutic    Hgb 7.9  Goal of Therapy:  Heparin level 0.3-0.7 units/ml Monitor platelets by anticoagulation protocol: Yes   Plan:  Continue heparin infusion at 1150 units/hr Monitor daily  heparin level and CBC, s/sx bleeding F/u long-term anticoagulation plan  Margot Ables, PharmD Clinical Pharmacist 11/18/2021 7:50 AM

## 2021-11-18 NOTE — TOC Initial Note (Signed)
Transition of Care [facility]) - Initial/Assessment Note    Patient Details  Name: Michele Romero MRN: [id number] Date of Birth: 1961/03/12  Transition of Care [facility]) CM/SW Contact:    Salome Arnt, [place] Phone Number: 11/18/2021, 9:13 AM  Clinical Narrative:  Pt admitted with sepsis. Assessment completed due to high risk readmission score. Pt reports she lives with her daughter. While her daughter is working, her granddaughter stays with her. Pt requires assist with toileting and bathing. She has a walker, but primarily uses wheelchair. Her brother or nephew provides transport to appointments. Pt plans to return home when medically stable. She is active with [place]. Linda with [facility] notified of admission. TOC received consult for Lovenox- pharmacy is following.                   Expected Discharge Plan: [place] Barriers to Discharge: Continued Medical Work up   Patient Goals and CMS Choice Patient states their goals for this hospitalization and ongoing recovery are:: return home   Choice offered to / list presented to : Patient  Expected Discharge Plan and Services Expected Discharge Plan: [place] In-house Referral: Clinical Social Work   Post Acute Care Choice: Resumption of Svcs/PTA Provider Living arrangements for the past 2 months: [place] Agency: [place] ([place]) Date [place]: 11/18/21 Time [place]: [phone] Representative spoke with at [place]: [place] Arrangements/Services Living arrangements for the past 2 months: [place] with:: Adult Children Patient language and need for interpreter reviewed:: Yes Do you feel safe going back to the place where you live?: Yes      Need for Family Participation in Patient Care: No (Comment) Care giver support system in place?: Yes (comment) Current home services: Home  RN Criminal Activity/Legal Involvement Pertinent to Current Situation/Hospitalization: No - Comment as needed  Activities of Daily Living Home Assistive Devices/Equipment: Wheelchair ADL Screening (condition at time of admission) Patient's cognitive ability adequate to safely complete daily activities?: Yes Is the patient deaf or have difficulty hearing?: No Does the patient have difficulty seeing, even when wearing glasses/contacts?: No Does the patient have difficulty concentrating, remembering, or making decisions?: No Patient able to express need for assistance with ADLs?: Yes Does the patient have difficulty dressing or bathing?: Yes Independently performs ADLs?: No Communication: Independent Dressing (OT): Needs assistance Is this a change from baseline?: Pre-admission baseline Grooming: Needs assistance Is this a change from baseline?: Pre-admission baseline Feeding: Needs assistance Is this a change from baseline?: Pre-admission baseline Bathing: Needs assistance Is this a change from baseline?: Pre-admission baseline Toileting: Needs assistance Is this a change from baseline?: Pre-admission baseline In/Out Bed: Needs assistance Is this a change from baseline?: Pre-admission baseline Walks in Home: Needs assistance Is this a change from baseline?: Pre-admission baseline Does the patient have difficulty walking or climbing stairs?: Yes Weakness of Legs: Both Weakness of Arms/Hands: Both  Permission Sought/Granted                  Emotional Assessment     Affect (typically observed): Appropriate Orientation: : Oriented to Self, Oriented to Place, Oriented to  Time, Oriented to Situation Alcohol / Substance Use: Not Applicable Psych Involvement: No (comment)  Admission diagnosis:  Sepsis (  [place]) [A41.9] Urinary tract infection without hematuria, site unspecified [N39.0] Sepsis without acute organ dysfunction, due to unspecified organism [facility]) [A41.9] Patient  Active Problem List   Diagnosis Date Noted   DVT, lower extremity, proximal, acute, right ([place]) 11/17/2021   Sepsis ([place]) 11/15/2021   UTI (urinary tract infection) 11/15/2021   Anasarca 11/15/2021   Protein calorie malnutrition ([place]) 11/15/2021   SIRS (systemic inflammatory response syndrome) ([place]) 10/25/2021   Pelvic mass in female 10/25/2021   Anemia 10/25/2021   GERD (gastroesophageal reflux disease) 10/25/2021   Neuropathy 10/25/2021   Dysuria 05/08/2021   Drug-induced neutropenia ([place]) 05/07/2021   Port-A-Cath in place 01/01/2021   Endometrial cancer ([place]) 11/15/2020   PMB (postmenopausal bleeding)    Thickened endometrium    Endometrial polyp    Left ear impacted cerumen 10/09/2020   Unilateral primary osteoarthritis, right hip 12/06/2019   Right hip pain 10/30/2019   Encounter for screening for malignant neoplasm of colon 10/30/2019   Encounter for general adult medical examination with abnormal findings 10/30/2019   Essential hypertension 10/30/2019   Chronic obstructive pulmonary disease ([place]) 10/28/2019   Prediabetes 01/20/2017   Osteoarthritis of right knee 01/20/2017   Morbid obesity ([place]) 06/25/2015   PCP:  Patient, No Pcp Per Pharmacy:   [place] [place] - [address] AT [place]. [place] [address] Phone: [phone] Fax: [phone]     Social Determinants of Health (SDOH) Interventions    Readmission Risk Interventions    11/18/2021    8:38 AM  Readmission Risk Prevention Plan  Transportation Screening Complete  HRI or Home Care Consult Complete  Social Work Consult for [place] Planning/Counseling Complete  Palliative Care Screening Not Applicable  Medication Review Press [employer]) Complete

## 2021-11-18 NOTE — Consult Note (Signed)
[facility] Consultation Oncology  Name: Michele Romero      MRN: [id number]    Location: IC02/IC02-01  Date: 11/18/2021 Time:4:54 PM   REFERRING PHYSICIAN: Dr. Roderic Palau  REASON FOR CONSULT: Endometrial carcinosarcoma    HISTORY OF PRESENT ILLNESS: This patient is 61 year old very pleasant female who is known to me from outpatient visits.  She has history of recurrent endometrial carcinosarcoma with rapid recurrence.  At last visit I have recommended lenvatinib and pembrolizumab.  She did not get a chance to receive pembrolizumab.  She took lenvatinib for 5 days and had worsening of abdominal cramping and stopped taking it.  She presented to the ER with abdominal pain.  CT angiogram of the chest, abdomen and pelvis showed large mass has measured 5 cm increase in size from the scan 1 month ago with progressive right-sided hydronephrosis from compression by enlarging pelvic mass.  She is currently receiving morphine and oxycodone for pain control.  She is also on antinausea medication.  Her mother is at bedside along with her brother.  PAST MEDICAL HISTORY:   Past Medical History:  Diagnosis Date   Ambulates with cane 10/31/2020   Arthritis    RIGHT HIP   Asthma    Cigarette nicotine dependence without complication 55/97/4163   Complex tear of medial meniscus of right knee 01/20/2017   COPD (chronic obstructive pulmonary disease) ([place])    ENDOMETRIAL CANCER 10/31/2020   Endometrial cancer ([place]) 11/15/2020   GERD (gastroesophageal reflux disease)    History of radiation therapy    HDR brachytherapy [place] 05/08/2021-06/03/2021  Dr Gery Pray   Migraines    Port-A-Cath in place 01/01/2021    ALLERGIES: Allergies  Allergen Reactions   Ivp Dye [Iodinated Contrast Media] Nausea And Vomiting    Patient had an episode of projectile vomiting after IV injection. Patient states it happens every time she has IV contrast.     Codeine Nausea And Vomiting and Rash      MEDICATIONS: I have  reviewed the patient's current medications.     PAST SURGICAL HISTORY Past Surgical History:  Procedure Laterality Date   APPENDECTOMY     YRS AGO PER PT ON 10-31-2020   BIOPSY  11/29/2019   Procedure: BIOPSY;  Surgeon: Harvel Quale, MD;  [facility];  Service: Gastroenterology;;  ileocecal vlve   CHOLECYSTECTOMY     YRS AGO PER PT ON 10-31-2020   COLONOSCOPY WITH PROPOFOL N/A 11/29/2019   Procedure: COLONOSCOPY WITH PROPOFOL;  Surgeon: Harvel Quale, MD;  [facility];  Service: Gastroenterology;  Laterality: N/A;  930   HYSTEROSCOPY WITH D & C N/A 10/10/2020   Procedure: DILATATION AND CURETTAGE /HYSTEROSCOPY;  Surgeon: Florian Buff, MD;  Location: [facility];  Service: Gynecology;  Laterality: N/A;   IR IMAGING GUIDED PORT INSERTION  01/08/2021   KNEE ARTHROPLASTY  2018   left knee   POLYPECTOMY  11/29/2019   Procedure: POLYPECTOMY;  Surgeon: Montez Morita, Quillian Quince, MD;  Location: [facility];  Service: Gastroenterology;;  sigmoid colon    ROBOTIC ASSISTED TOTAL HYSTERECTOMY WITH BILATERAL SALPINGO OOPHERECTOMY N/A 11/15/2020   Procedure: XI ROBOTIC ASSISTED TOTAL HYSTERECTOMY GREATER THAN TWO HUNDRED AND FIFTY GRAMS WITH BILATERAL SALPINGO OOPHORECTOMY, MINI LAPAROTOMY;  Surgeon: Everitt Amber, MD;  Location: [facility];  Service: Gynecology;  Laterality: N/A;   SENTINEL NODE BIOPSY N/A 11/15/2020   Procedure: SENTINEL NODE BIOPSY;  Surgeon: Everitt Amber, MD;  Location: [facility];  Service: Gynecology;  Laterality: N/A;   TUBAL LIGATION  04/14/1985    FAMILY HISTORY: Family History  Problem Relation Age of Onset   Diabetes Mother    Hypertension Mother    Heart disease Mother        CHF   Stroke Mother    Heart disease Father    Colon cancer Brother    Breast cancer Neg Hx    Ovarian cancer Neg Hx    Endometrial cancer Neg Hx    Prostate cancer Neg Hx    Pancreatic cancer Neg Hx     SOCIAL  HISTORY:  reports that she quit smoking about 22 months ago. Her smoking use included cigarettes. She has a 15.00 pack-year smoking history. She has never been exposed to tobacco smoke. She has never used smokeless tobacco. She reports current drug use. Frequency: 6.00 times per week. Drug: Marijuana. She reports that she does not drink alcohol.  PERFORMANCE STATUS: The patient's performance status is 3 - Symptomatic, >50% confined to bed  PHYSICAL EXAM: Most Recent Vital Signs: Blood pressure 110/71, pulse (!) 117, temperature 98.2 F (36.8 C), temperature source Oral, resp. rate 19, height '5\' 2"'$  (1.575 m), weight 195 lb 8.8 oz (88.7 kg), last menstrual period 04/14/2010, SpO2 97 %. BP 110/71   Pulse (!) 117   Temp 98.2 F (36.8 C) (Oral)   Resp 19   Ht '5\' 2"'$  (1.575 m)   Wt 195 lb 8.8 oz (88.7 kg)   LMP 04/14/2010   SpO2 97%   BMI 35.77 kg/m  General appearance: alert, cooperative, and appears stated age Extremities:  2+ edema bilaterally. Neurologic: Grossly normal  LABORATORY DATA:  Results for orders placed or performed during the hospital encounter of 11/15/21 (from the past 48 hour(s))  Hemoglobin and hematocrit, blood     Status: Abnormal   Collection Time: 11/16/21  5:02 PM  Result Value Ref Range   Hemoglobin 8.6 (L) 12.0 - 15.0 g/dL    Comment: POST TRANSFUSION SPECIMEN   HCT 26.5 (L) 36.0 - 46.0 %    Comment: Performed at [facility], [address], [place], [place] [address]  Heparin level (unfractionated)     Status: None   Collection Time: 11/16/21  5:02 PM  Result Value Ref Range   Heparin Unfractionated 0.61 0.30 - 0.70 IU/mL    Comment: (NOTE) The clinical reportable range upper limit is being lowered to >1.10 to align with the FDA approved guidance for the current laboratory assay.  If heparin results are below expected values, and patient dosage has  been confirmed, suggest follow up testing of antithrombin III levels. Performed at [facility],  [address] [place], [place] [address]   Heparin level (unfractionated)     Status: None   Collection Time: 11/16/21 11:10 PM  Result Value Ref Range   Heparin Unfractionated 0.58 0.30 - 0.70 IU/mL    Comment: (NOTE) The clinical reportable range upper limit is being lowered to >1.10 to align with the FDA approved guidance for the current laboratory assay.  If heparin results are below expected values, and patient dosage has  been confirmed, suggest follow up testing of antithrombin III levels. Performed at [facility], [address], [place], [place] [address]   CBC     Status: Abnormal   Collection Time: 11/17/21  5:14 AM  Result Value Ref Range   WBC 6.6 4.0 - 10.5 K/uL   RBC 3.14 (L) 3.87 - 5.11 MIL/uL   Hemoglobin  8.8 (L) 12.0 - 15.0 g/dL   HCT 27.7 (L) 36.0 - 46.0 %   MCV 88.2 80.0 - 100.0 fL   MCH 28.0 26.0 - 34.0 pg   MCHC 31.8 30.0 - 36.0 g/dL   RDW 18.1 (H) 11.5 - 15.5 %   Platelets 203 150 - 400 K/uL   nRBC 0.0 0.0 - 0.2 %    Comment: Performed at [facility], [address], [place], [place] [address]  Basic metabolic panel     Status: Abnormal   Collection Time: 11/17/21  5:14 AM  Result Value Ref Range   Sodium 134 (L) 135 - 145 mmol/L   Potassium 3.5 3.5 - 5.1 mmol/L   Chloride 103 98 - 111 mmol/L   CO2 25 22 - 32 mmol/L   Glucose, Bld 89 70 - 99 mg/dL    Comment: Glucose reference range applies only to samples taken after fasting for at least 8 hours.   BUN 11 8 - 23 mg/dL   Creatinine, Ser 0.77 0.44 - 1.00 mg/dL   Calcium 7.9 (L) 8.9 - 10.3 mg/dL   GFR, Estimated >60 >60 mL/min    Comment: (NOTE) Calculated using the CKD-EPI Creatinine Equation (2021)    Anion gap 6 5 - 15    Comment: Performed at [facility], [address] [place], [place] [address]  Magnesium     Status: Abnormal   Collection Time: 11/17/21  5:14 AM  Result Value Ref Range   Magnesium 1.5 (L) 1.7 - 2.4 mg/dL    Comment: Performed at [facility], [address] [place], [place]  [address]  Phosphorus     Status: None   Collection Time: 11/17/21  5:14 AM  Result Value Ref Range   Phosphorus 3.2 2.5 - 4.6 mg/dL    Comment: Performed at [facility], [address], [place], [place] [address]  Heparin level (unfractionated)     Status: None   Collection Time: 11/17/21  5:14 AM  Result Value Ref Range   Heparin Unfractionated 0.61 0.30 - 0.70 IU/mL    Comment: (NOTE) The clinical reportable range upper limit is being lowered to >1.10 to align with the FDA approved guidance for the current laboratory assay.  If heparin results are below expected values, and patient dosage has  been confirmed, suggest follow up testing of antithrombin III levels. Performed at [facility], [address], [place], [place] [address]   CBC     Status: Abnormal   Collection Time: 11/18/21  6:05 AM  Result Value Ref Range   WBC 7.4 4.0 - 10.5 K/uL   RBC 2.85 (L) 3.87 - 5.11 MIL/uL   Hemoglobin 7.9 (L) 12.0 - 15.0 g/dL   HCT 25.3 (L) 36.0 - 46.0 %   MCV 88.8 80.0 - 100.0 fL   MCH 27.7 26.0 - 34.0 pg   MCHC 31.2 30.0 - 36.0 g/dL   RDW 18.2 (H) 11.5 - 15.5 %   Platelets 194 150 - 400 K/uL   nRBC 0.0 0.0 - 0.2 %    Comment: Performed at [facility], [address], [place], [place] [address]  Magnesium     Status: None   Collection Time: 11/18/21  6:05 AM  Result Value Ref Range   Magnesium 2.2 1.7 - 2.4 mg/dL    Comment: Performed at [facility], [address], [place], [place] [address]  Heparin level (unfractionated)     Status: None   Collection Time: 11/18/21  6:05 AM  Result Value Ref Range   Heparin Unfractionated 0.34 0.30 - 0.70 IU/mL    Comment: (NOTE) The clinical reportable range upper limit is being lowered to >1.10 to align with the FDA approved guidance for the current laboratory assay.  If heparin results are below expected values, and patient dosage has  been confirmed, suggest follow up testing of antithrombin III levels. Performed at [facility], [address] [place], [place] [address]   Renal function panel     Status: Abnormal   Collection Time: 11/18/21  6:05 AM  Result Value Ref Range   Sodium 136 135 - 145 mmol/L   Potassium 4.2 3.5 - 5.1 mmol/L   Chloride 104 98 - 111 mmol/L   CO2 25 22 - 32 mmol/L   Glucose, Bld 94 70 - 99 mg/dL    Comment: Glucose reference range applies only to samples taken after fasting for at least 8 hours.   BUN 12 8 - 23 mg/dL   Creatinine, Ser 0.78 0.44 - 1.00 mg/dL   Calcium 8.3 (L) 8.9 - 10.3 mg/dL   Phosphorus 2.6 2.5 - 4.6 mg/dL   Albumin 2.8 (L) 3.5 - 5.0 g/dL   GFR, Estimated >60 >60 mL/min    Comment: (NOTE) Calculated using the CKD-EPI Creatinine Equation (2021)    Anion gap 7 5 - 15    Comment: Performed at [facility], [address] [place], [place] [address]      RADIOGRAPHY: No results found.     ASSESSMENT:  1.  Recurrent endometrial carcinosarcoma: - Carboplatin and paclitaxel from 01/23/2021 through 06/10/2021 - CTA PE (10/25/2021): Large heterogeneous mass extending from the vaginal cuff into the mid abdomen measuring 24.1 x 17.3 x 14.7 cm. - Lenvatinib 14 mg took for 5 days and stopped.  PLAN:  1.  Recurrent endometrial carcinosarcoma: - I have reviewed CT scans images and findings with the patient. - She has rapid growth of the tumor with 5 cm increase in the mass size in 4 weeks.  She also has right hydronephrosis from the tumor compression. - Patient had difficulty tolerating decreased dose of lenvatinib.  He has not even received pembrolizumab. - I have discussed that even with pembrolizumab and lenvatinib regimen the response rates are 30%.  She is not in a position to tolerate lenvatinib. - I have talked to her about best supportive care in the form of hospice.  She is agreeable. - Proceed with home hospice.  2.  DVT right lower extremity: - She is on heparin.  May transition to [place].  3.  Anemia: - Transfuse as needed.  4.  Abdominal pain: - Tumor pain.   Continue narcotics.  All questions were answered. The patient knows to call the clinic with any problems, questions or concerns. We can certainly see the patient much sooner if necessary.    Derek Jack

## 2021-11-19 DIAGNOSIS — A419 Sepsis, unspecified organism: Secondary | ICD-10-CM | POA: Diagnosis not present

## 2021-11-19 DIAGNOSIS — Z515 Encounter for palliative care: Secondary | ICD-10-CM | POA: Diagnosis not present

## 2021-11-19 DIAGNOSIS — Z7189 Other specified counseling: Secondary | ICD-10-CM | POA: Diagnosis not present

## 2021-11-19 DIAGNOSIS — R601 Generalized edema: Secondary | ICD-10-CM | POA: Diagnosis not present

## 2021-11-19 DIAGNOSIS — I824Y1 Acute embolism and thrombosis of unspecified deep veins of right proximal lower extremity: Secondary | ICD-10-CM | POA: Diagnosis not present

## 2021-11-19 DIAGNOSIS — D649 Anemia, unspecified: Secondary | ICD-10-CM | POA: Diagnosis not present

## 2021-11-19 LAB — CBC
HCT: 26.1 % — ABNORMAL LOW (ref 36.0–46.0)
Hemoglobin: 8.2 g/dL — ABNORMAL LOW (ref 12.0–15.0)
MCH: 27.7 pg (ref 26.0–34.0)
MCHC: 31.4 g/dL (ref 30.0–36.0)
MCV: 88.2 fL (ref 80.0–100.0)
Platelets: 202 10*3/uL (ref 150–400)
RBC: 2.96 MIL/uL — ABNORMAL LOW (ref 3.87–5.11)
RDW: 18.6 % — ABNORMAL HIGH (ref 11.5–15.5)
WBC: 12.2 10*3/uL — ABNORMAL HIGH (ref 4.0–10.5)
nRBC: 0 % (ref 0.0–0.2)

## 2021-11-19 LAB — RENAL FUNCTION PANEL
Albumin: 3.1 g/dL — ABNORMAL LOW (ref 3.5–5.0)
Anion gap: 7 (ref 5–15)
BUN: 14 mg/dL (ref 8–23)
CO2: 24 mmol/L (ref 22–32)
Calcium: 8 mg/dL — ABNORMAL LOW (ref 8.9–10.3)
Chloride: 102 mmol/L (ref 98–111)
Creatinine, Ser: 0.82 mg/dL (ref 0.44–1.00)
GFR, Estimated: >60 mL/min (ref >60)
Glucose, Bld: 140 mg/dL — ABNORMAL HIGH (ref 70–99)
Phosphorus: 2.3 mg/dL — ABNORMAL LOW (ref 2.5–4.6)
Potassium: 3.8 mmol/L (ref 3.5–5.1)
Sodium: 133 mmol/L — ABNORMAL LOW (ref 135–145)

## 2021-11-19 LAB — MAGNESIUM: Magnesium: 1.9 mg/dL (ref 1.7–2.4)

## 2021-11-19 LAB — HEPARIN LEVEL (UNFRACTIONATED): Heparin Unfractionated: 0.31 IU/mL (ref 0.30–0.70)

## 2021-11-19 MED ORDER — HYDROXYZINE HCL 25 MG PO TABS
25.0000 mg | ORAL_TABLET | Freq: Three times a day (TID) | ORAL | Status: DC | PRN
  Administered 2021-11-19: 25 mg via ORAL
  Filled 2021-11-19: qty 1

## 2021-11-19 MED ORDER — APIXABAN 5 MG PO TABS: 5.0000 mg | ORAL_TABLET | Freq: Two times a day (BID) | ORAL | Status: DC

## 2021-11-19 MED ORDER — MILK AND MOLASSES ENEMA
1.0000 | Freq: Once | RECTAL | Status: AC
Start: 2021-11-19 — End: 2021-11-19
  Administered 2021-11-19: 240 mL via RECTAL

## 2021-11-19 MED ORDER — APIXABAN 5 MG PO TABS
10.0000 mg | ORAL_TABLET | Freq: Two times a day (BID) | ORAL | Status: DC
  Administered 2021-11-19 – 2021-11-21 (×5): 10 mg via ORAL
  Filled 2021-11-19 (×5): qty 2

## 2021-11-19 MED ORDER — ATENOLOL 25 MG PO TABS
25.0000 mg | ORAL_TABLET | Freq: Once | ORAL | Status: AC
  Administered 2021-11-19: 25 mg via ORAL
  Filled 2021-11-19: qty 1

## 2021-11-19 NOTE — Progress Notes (Signed)
ANTICOAGULATION CONSULT NOTE   Pharmacy Consult for heparin=> Eliquis Indication: DVT  Allergies  Allergen Reactions   Ivp Dye [Iodinated Contrast Media] Nausea And Vomiting    Patient had an episode of projectile vomiting after IV injection. Patient states it happens every time she has IV contrast.     Codeine Nausea And Vomiting and Rash    Patient Measurements: Height: '5\' 2"'$  (157.5 cm) Weight: 90.4 kg (199 lb 4.7 oz) IBW/kg (Calculated) : 50.1 Heparin Dosing Weight: 70 kg  Vital Signs: Temp: 98.1 F (36.7 C) (08/08 0400) Temp Source: Oral (08/08 0400) BP: 93/72 (08/08 0700) Pulse Rate: 104 (08/08 0700)  Labs: Recent Labs    11/17/21 0514 11/18/21 0605 11/19/21 0116 11/19/21 0541  HGB 8.8* 7.9* 8.2*  --   HCT 27.7* 25.3* 26.1*  --   PLT 203 194 202  --   HEPARINUNFRC 0.61 0.34  --  0.31  CREATININE 0.77 0.78 0.82  --      Estimated Creatinine Clearance: 74.3 mL/min (by C-G formula based on SCr of 0.82 mg/dL).   Medical History: Past Medical History:  Diagnosis Date   Ambulates with cane 10/31/2020   Arthritis    RIGHT HIP   Asthma    Cigarette nicotine dependence without complication 16/04/930   Complex tear of medial meniscus of right knee 01/20/2017   COPD (chronic obstructive pulmonary disease) ([place])    ENDOMETRIAL CANCER 10/31/2020   Endometrial cancer ([place]) 11/15/2020   GERD (gastroesophageal reflux disease)    History of radiation therapy    HDR brachytherapy [place] 05/08/2021-06/03/2021  Dr Gery Pray   Migraines    Port-A-Cath in place 01/01/2021    Medications:  Medications Prior to Admission  Medication Sig Dispense Refill Last Dose   acetaminophen (TYLENOL) 500 MG tablet Take 1,000 mg by mouth every 6 (six) hours as needed for moderate pain.   UNKNOWN   albuterol (VENTOLIN HFA) 108 (90 Base) MCG/ACT inhaler Inhale 2 puffs into the lungs every 6 (six) hours as needed for wheezing or shortness of breath. 8 g 2 UNKNOWN   gabapentin  (NEURONTIN) 300 MG capsule Take 1 capsule (300 mg total) by mouth 3 (three) times daily. Take additional capsule at bed time for total dose '600mg'$  120 capsule 6 11/15/2021   HYDROcodone-acetaminophen (NORCO/VICODIN) 5-325 MG tablet Take 1 tablet by mouth every 6 hours as needed for moderate pain. 60 tablet 0 11/14/2021   lenvatinib 14 mg daily dose (LENVIMA) 10 & 4 MG capsule Take 14 mg by mouth daily. 60 capsule 3 11/15/2021   loperamide (IMODIUM) 2 MG capsule Take 1 capsule (2 mg total) by mouth as needed for diarrhea or loose stools (Take 2 Capsules after the 1st loose stool and then 1 capsule adter each loose stool. Do not exceed 8 capsules in a 24-hr period. If it is bedtime and you are having loose stools, take 2 capsules at bestime and then take 2 capsules every 4 hours until morning.). 30 capsule 0 UNKNOWN   magnesium oxide (MAG-OX) 400 (240 Mg) MG tablet Take 1 tablet (400 mg total) by mouth in the morning, at noon, and at bedtime. 90 tablet 6 11/15/2021   vitamin B-12 (CYANOCOBALAMIN) 500 MCG tablet Take 500 mcg by mouth daily.   UNKNOWN   prochlorperazine (COMPAZINE) 10 MG tablet Take 1 tablet (10 mg total) by mouth every 6 (six) hours as needed for nausea or vomiting. (Patient not taking: Reported on 11/07/2021) 60 tablet 1 Not Taking    Assessment:  Pharmacy consulted to dose heparin in patient with DVT confirmed by [place].  CTA chest negative for pulmonary embolus.  Patient is not on anticoagulation prior to admission.Recurrent endometrial carcinosarcoma.   Heparin level - 0.31: remains therapeutic. Now plan to transition to PO Eliquis   Hgb 7.9> 8.2  Goal of Therapy:  Heparin level 0.3-0.7 units/ml Monitor platelets by anticoagulation protocol: Yes   Plan:  D/C heparin Eliquis '10mg'$  po BID x 7 days, then '5mg'$  po BID Educate on eliquis Monitor daily CBC, s/sx bleeding  Isac Sarna, BS Pharm D, BCPS Clinical Pharmacist 11/19/2021 7:55 AM

## 2021-11-19 NOTE — Progress Notes (Addendum)
[facility] Progress Note  Patient: Michele Romero    NKN:[id number]  DOA: 11/15/2021     Date of Service: the patient was seen and examined on 11/19/2021  Chief Complaint  Patient presents with   Abdominal Pain   Brief hospital course: Michele Romero is a 61 y.o. female with medical history significant of endometrial cancer, COPD, GERD, migraines, hypertension, and more presents the ED with a chief complaint of headaches, stomach cramping, leg swelling.  Patient reports that she started a new chemotherapy on Monday.  Her symptoms started on Tuesday.  She describes right-sided headaches.  No changes in vision.  Pain pills help and go away.  Dark room, that is quiet also helps but does not relieve the pain.  She has no associated asymptomatic weakness, no difficulty speaking, no difficulty swallowing.  She does report dizziness, but the dizziness seems to be orthostatic as it happens upon standing to go to the bathroom etc.  She describes left-sided abdominal pain.  Last bowel movement was this morning.  It was normal.  Her abdominal pain is intermittent.  P.o. intake makes her nauseous which makes the pain worse.  She reports it comes on consistently about an hour after she takes her chemo pill.  She has had no melena or hematochezia.  She has leg swelling that is been going on for a while but has been progressively worse over the last 5 days.  She has no pain in her legs, no erythema.  She does describe a cramping sensation in her legs, but does not cause pain.  She had no weeping.  Patient denies chest pain, palpitations, shortness of breath.  She denies dysuria but admits to urinary urgency and frequency.  No hematuria.  Patient has no other complaints at this time.   Patient does not smoke, does not drink alcohol, does use marijuana occasionally.  The last use was approximately 1 week ago.  She is not vaccinated for COVID.  Patient is full code.   Assessment and Plan: Sepsis ([place]) - On  admission: Heart rate of 120, respiratory rate 22, lactic acidosis at 2.5 -UA is indicative of UTI -complete 5 days of ceftriaxone -Urine culture with multiple species -Blood culture with no growth thus far -Chest x-ray shows no active disease -No other signs or symptoms of infection    DVT right lower extremity Venous Dopplers positive for right common femoral DVT Started heparin IV infusion Transitioned to eliquis CTA chest negative for pulmonary embolus  Anemia Hb dropped to 6.8 down from 8.1 several days ago -No signs or symptoms of bleeding -Likely related to chemotherapy -Continue to monitor 8/5 transfuse 1 unit of PRBC -Follow-up hemoglobin improved to 8.2 monitor H&H and transfuse if hemoglobin less than 7   Protein calorie malnutrition (HCC) - Albumin 2.0 -Secondary to poor p.o. intake -received albumin infusions -encourage po intake  Constipation -Reports no bowel movement for several days -suppository on 8/6 with small BM per patient -CT abd yesterday did not show any sbo -continue miralax -give milk and molasses enema today    Anasarca -Pitting edema to umbilicus -Likely related to hypoalbuminemia -Provided albumin infusion with low-dose IV Lasix -will have to hold off on further diuresis with low blood pressures -Continue to monitor   UTI (urinary tract infection) - UA indicative of UTI -Complaints of urinary frequency and urgency -There is concern for pyelonephritis given right-sided obstructive uropathy due to the tumor -Continue Rocephin -See treatment plan for sepsis   GERD (  gastroesophageal reflux disease) - Continue PPI   Endometrial cancer ([place]) - Recently started new chemo -Not tolerating very well with abdominal pain, leg swelling, headaches -CT scan done on admission shows further enlargement of pelvic mass -Oncology following and has recommend that patient pursue hospice as her cancer is quite aggressive and she has progressed  despite being on treatments. She is not a surgical candidate. -Seen by palliative care and patient is leaning towards discharging home with hospice services.  So having conversations with her family regarding hospice as well as goals of care/DNR.  She is currently still full code.  Right-sided hydronephrosis -Felt to be secondary to compression from pelvic mass -Chronic finding -Renal function currently stable -Continue to follow clinically  Essential hypertension - Blood pressures currently soft -No antihypertensives at this time   Body mass index is 34.96 kg/m.  Interventions:    Diet: soft diet DVT Prophylaxis: Therapeutic Anticoagulation with eliquis    Advance goals of care discussion: Full code.  Requested palliative care consultation to help further address goals of care  Family Communication: Discussed with patient's daughter over the phone  Subjective: tolerating breakfast this morning without vomiting. No BM yesterday. Has continued abdominal pain   Physical Exam: General exam: Alert, awake, oriented x 3 Respiratory system: Clear to auscultation. Respiratory effort normal. Cardiovascular system:RRR. No murmurs, rubs, gallops. Gastrointestinal system: Abdomen is distended, soft and nontender. No organomegaly or masses felt. Normal bowel sounds heard. Central nervous system: Alert and oriented. No focal neurological deficits. Extremities: 2+ edema bilaterally Skin: No rashes, lesions or ulcers Psychiatry: Judgement and insight appear normal. Mood & affect appropriate.    Vitals:   11/19/21 0500 11/19/21 0700 11/19/21 0800 11/19/21 0900  BP: 1'09/70 93/72 96/74 '$ (!) 89/70  Pulse: (!) 105 (!) 104 (!) 104 (!) 107  Resp: '10 11 16 15  '$ Temp:      TempSrc:      SpO2: 99% 93% 96% 96%  Weight: 90.4 kg     Height:        Intake/Output Summary (Last 24 hours) at 11/19/2021 0930 Last data filed at 11/19/2021 0500 Gross per 24 hour  Intake 1382.03 ml  Output 1400 ml  Net  -17.97 ml   Filed Weights   11/16/21 0349 11/18/21 0435 11/19/21 0500  Weight: 86.7 kg 88.7 kg 90.4 kg    Data Reviewed: I have personally reviewed and interpreted daily labs, tele strips, imagings as discussed above. I reviewed all nursing notes, pharmacy notes, vitals, pertinent old records I have discussed plan of care as described above with RN and patient/family.  CBC: Recent Labs  Lab 11/15/21 1650 11/16/21 0531 11/16/21 1702 11/17/21 0514 11/18/21 0605 11/19/21 0116  WBC 8.1 6.6  --  6.6 7.4 12.2*  NEUTROABS  --  4.1  --   --   --   --   HGB 7.0* 6.8* 8.6* 8.8* 7.9* 8.2*  HCT 22.9* 21.6* 26.5* 27.7* 25.3* 26.1*  MCV 89.8 87.4  --  88.2 88.8 88.2  PLT 155 173  --  203 194 417   Basic Metabolic Panel: Recent Labs  Lab 11/15/21 1650 11/16/21 0531 11/16/21 0820 11/17/21 0514 11/18/21 0605 11/19/21 0116  NA 135 134*  --  134* 136 133*  K 4.0 4.0  --  3.5 4.2 3.8  CL 102 103  --  103 104 102  CO2 23 24  --  '25 25 24  '$ GLUCOSE 110* 91  --  89 94 140*  BUN 14  13  --  '11 12 14  '$ CREATININE 0.78 0.80  --  0.77 0.78 0.82  CALCIUM 8.4* 8.1*  --  7.9* 8.3* 8.0*  MG  --  1.7  --  1.5* 2.2 1.9  PHOS  --   --  3.5 3.2 2.6 2.3*    Studies: CT ABDOMEN PELVIS WO CONTRAST  Result Date: 11/18/2021 CLINICAL DATA:  Abdominal pain and distension EXAM: CT ABDOMEN AND PELVIS WITHOUT CONTRAST TECHNIQUE: Multidetector CT imaging of the abdomen and pelvis was performed following the standard protocol without IV contrast. RADIATION DOSE REDUCTION: This exam was performed according to the departmental dose-optimization program which includes automated exposure control, adjustment of the mA and/or kV according to patient size and/or use of iterative reconstruction technique. COMPARISON:  11/16/2021 FINDINGS: Lower chest: Stable right lower lobe atelectasis and trace bilateral effusions. Hepatobiliary: Unremarkable unenhanced appearance of the liver. Gallbladder is surgically absent. Pancreas:  Unremarkable unenhanced appearance. Spleen: Unremarkable unenhanced appearance. Adrenals/Urinary Tract: Continued right-sided hydronephrosis related to mass effect from the known large pelvic mass. Left kidney is unremarkable. No urinary tract calculi. The adrenals are stable. Stable mass effect upon the urinary bladder. Stomach/Bowel: There is distension of the distal thoracic esophagus with oral contrast, which may reflect reflux. At the time of imaging, oral contrast is seen filling nondilated loops of small bowel. Distal small bowel is incompletely opacified, limiting evaluation. Moderate gas and stool throughout the colon unchanged since prior exam. No evidence of bowel obstruction at this time. Vascular/Lymphatic: Stable mild aortic atherosclerosis. No discrete adenopathy identified on this limited unenhanced exam. Reproductive: No change in the large complex pelvic mass consistent with known endometrial carcinoma. Significant mass effect upon the adjacent bowel, bladder, and right ureter. Other: Trace ascites. No free intraperitoneal gas. There is diffuse body wall edema consistent with anasarca, stable. Musculoskeletal: Right greater than left hip osteoarthritis. Stable lower lumbar degenerative changes. No acute or destructive bony lesions. IMPRESSION: 1. Progression of oral contrast into normal caliber mid jejunal loops as above, with no evidence of high-grade obstruction. There is significant mass effect upon the distal small bowel by the known pelvic mass, but I do not see any evidence of small-bowel obstruction at this time. 2. Large complex pelvic mass consistent with known history of endometrial cancer. 3. Persistent right hydronephrosis due to extrinsic compression of the right ureter by the large pelvic mass. No change since prior exam. 4. Dilated distal thoracic esophagus containing oral contrast, which may reflect gastroesophageal reflux. 5. Trace ascites. 6. Diffuse anasarca. Electronically  Signed   By: Randa Ngo M.D.   On: 11/18/2021 23:07    Scheduled Meds:  apixaban  10 mg Oral BID   Followed by   Derrill Memo ON 11/26/2021] apixaban  5 mg Oral BID   Chlorhexidine Gluconate Cloth  6 each Topical Daily   feeding supplement  237 mL Oral TID BM   gabapentin  300 mg Oral TID   milk and molasses  1 enema Rectal Once   pantoprazole (PROTONIX) IV  40 mg Intravenous Q24H   polyethylene glycol  17 g Oral Daily   cyanocobalamin  500 mcg Oral Daily   Continuous Infusions:  cefTRIAXone (ROCEPHIN)  IV 2 g (11/18/21 1550)   PRN Meds: acetaminophen **OR** acetaminophen, albuterol, HYDROcodone-acetaminophen, midodrine, morphine injection, ondansetron **OR** ondansetron (ZOFRAN) IV, oxyCODONE  Time spent: 35 minutes  Author:  Kathie Dike, MD  [facility] 11/19/2021 9:30 AM  To reach On-call, see care teams to locate the attending and reach  out to them via [email or web address]. If 7PM-7AM, please contact night-coverage If you still have difficulty reaching the attending provider, please page the [facility] (Director on Call) for [facility] on amion for assistance.

## 2021-11-19 NOTE — TOC Progression Note (Signed)
Transition of Care [facility]) - Progression Note    Patient Details  Name: SHALANA JARDIN MRN: [id number] Date of Birth: 05/05/1960  Transition of [facility]) CM/SW Contact  Boneta Lucks, RN Phone Number: 11/19/2021, 1:13 PM  Clinical Narrative:   Palliative consulted TOC to refer patient to [facility] for [facility] care. Referral sent. TOC for for discharge time and equipment needs.   Expected Discharge Plan: Home w Hospice Care Barriers to Discharge: Continued Medical Work up  Expected Discharge Plan and Services Expected Discharge Plan: [place] In-house Referral: Clinical Social Work   Post Acute Care Choice: Resumption of Svcs/PTA Provider Living arrangements for the past 2 months: [place]: [place] ([place]) Date [place]: 11/18/21 Time [place]: 0913 Representative spoke with at [place]: Vaughan Basta   Readmission Risk Interventions    11/18/2021    8:38 AM  Readmission Risk Prevention Plan  Transportation Screening Complete  HRI or [place] Complete  Social Work Consult for [place] Planning/Counseling Complete  Palliative Care Screening Not Applicable  Medication Review Press photographer) Complete

## 2021-11-19 NOTE — Progress Notes (Signed)
ANTICOAGULATION CONSULT NOTE   Pharmacy Consult for heparin Indication: DVT  Allergies  Allergen Reactions   Ivp Dye [Iodinated Contrast Media] Nausea And Vomiting    Patient had an episode of projectile vomiting after IV injection. Patient states it happens every time she has IV contrast.     Codeine Nausea And Vomiting and Rash    Patient Measurements: Height: '5\' 2"'$  (157.5 cm) Weight: 90.4 kg (199 lb 4.7 oz) IBW/kg (Calculated) : 50.1 Heparin Dosing Weight: 70 kg  Vital Signs: Temp: 98.1 F (36.7 C) (08/08 0400) Temp Source: Oral (08/08 0400) BP: 93/72 (08/08 0700) Pulse Rate: 104 (08/08 0700)  Labs: Recent Labs    11/17/21 0514 11/18/21 0605 11/19/21 0116 11/19/21 0541  HGB 8.8* 7.9* 8.2*  --   HCT 27.7* 25.3* 26.1*  --   PLT 203 194 202  --   HEPARINUNFRC 0.61 0.34  --  0.31  CREATININE 0.77 0.78 0.82  --      Estimated Creatinine Clearance: 74.3 mL/min (by C-G formula based on SCr of 0.82 mg/dL).   Medical History: Past Medical History:  Diagnosis Date   Ambulates with cane 10/31/2020   Arthritis    RIGHT HIP   Asthma    Cigarette nicotine dependence without complication 12/45/8099   Complex tear of medial meniscus of right knee 01/20/2017   COPD (chronic obstructive pulmonary disease) ([place])    ENDOMETRIAL CANCER 10/31/2020   Endometrial cancer ([place]) 11/15/2020   GERD (gastroesophageal reflux disease)    History of radiation therapy    HDR brachytherapy [place] 05/08/2021-06/03/2021  Dr Gery Pray   Migraines    Port-A-Cath in place 01/01/2021    Medications:  Medications Prior to Admission  Medication Sig Dispense Refill Last Dose   acetaminophen (TYLENOL) 500 MG tablet Take 1,000 mg by mouth every 6 (six) hours as needed for moderate pain.   UNKNOWN   albuterol (VENTOLIN HFA) 108 (90 Base) MCG/ACT inhaler Inhale 2 puffs into the lungs every 6 (six) hours as needed for wheezing or shortness of breath. 8 g 2 UNKNOWN   gabapentin (NEURONTIN) 300 MG  capsule Take 1 capsule (300 mg total) by mouth 3 (three) times daily. Take additional capsule at bed time for total dose '600mg'$  120 capsule 6 11/15/2021   HYDROcodone-acetaminophen (NORCO/VICODIN) 5-325 MG tablet Take 1 tablet by mouth every 6 hours as needed for moderate pain. 60 tablet 0 11/14/2021   lenvatinib 14 mg daily dose (LENVIMA) 10 & 4 MG capsule Take 14 mg by mouth daily. 60 capsule 3 11/15/2021   loperamide (IMODIUM) 2 MG capsule Take 1 capsule (2 mg total) by mouth as needed for diarrhea or loose stools (Take 2 Capsules after the 1st loose stool and then 1 capsule adter each loose stool. Do not exceed 8 capsules in a 24-hr period. If it is bedtime and you are having loose stools, take 2 capsules at bestime and then take 2 capsules every 4 hours until morning.). 30 capsule 0 UNKNOWN   magnesium oxide (MAG-OX) 400 (240 Mg) MG tablet Take 1 tablet (400 mg total) by mouth in the morning, at noon, and at bedtime. 90 tablet 6 11/15/2021   vitamin B-12 (CYANOCOBALAMIN) 500 MCG tablet Take 500 mcg by mouth daily.   UNKNOWN   prochlorperazine (COMPAZINE) 10 MG tablet Take 1 tablet (10 mg total) by mouth every 6 (six) hours as needed for nausea or vomiting. (Patient not taking: Reported on 11/07/2021) 60 tablet 1 Not Taking    Assessment: Pharmacy  consulted to dose heparin in patient with DVT confirmed by [place].  CTA chest negative for pulmonary embolus.  Patient is not on anticoagulation prior to admission.Recurrent endometrial carcinosarcoma.   Heparin level - 0.31: remains therapeutic   Hgb 7.9> 8.2  Goal of Therapy:  Heparin level 0.3-0.7 units/ml Monitor platelets by anticoagulation protocol: Yes   Plan:  Continue heparin infusion at 1150 units/hr Monitor daily heparin level and CBC, s/sx bleeding F/u long-term anticoagulation plan  Isac Sarna, BS [place], BCPS Clinical Pharmacist Pager [phone]  11/19/2021 7:36 AM

## 2021-11-19 NOTE — Progress Notes (Incomplete)
  Daily Progress Note   Patient Name: Michele Romero       Date: 11/19/2021 DOB: [birth year]  Age: 61 y.o. MRN#: [id number] Attending Physician: Kathie Dike, MD Primary Care Physician: Patient, No Pcp Per Admit Date: 11/15/2021 Length of Stay: 4 days  Reason for Consultation/Follow-up: {Reason for Consult:23484}  HPI/Patient Profile:  ***  Subjective:   Subjective: Chart Reviewed. Updates received. Patient Assessed. Created space and opportunity for patient  and family to explore thoughts and feelings regarding current medical situation.  Today's Discussion: ***  Review of Systems  Objective:   Vital Signs:  BP (!) 89/70   Pulse (!) 107   Temp (!) 97.3 F (36.3 C) (Axillary)   Resp 15   Ht '5\' 2"'$  (1.575 m)   Wt 90.4 kg   LMP 04/14/2010   SpO2 96%   BMI 36.45 kg/m   Physical Exam: Physical Exam  Palliative Assessment/Data: ***   Assessment & Plan:   Impression: Present on Admission: . Sepsis ([place]) . Anemia . Endometrial cancer ([place]) . GERD (gastroesophageal reflux disease) . Essential hypertension  ***  SUMMARY OF RECOMMENDATIONS   ***  Symptom Management:  ***  Code Status: {Palliative Code status:23503}  Prognosis: {Palliative Care Prognosis:23504}  Discharge Planning: {Palliative dispostion:23505}  Discussed with: ***  Thank you for allowing [place] to participate in the care of Michele Romero PMT will continue to support holistically.  Time Total: ***  Visit consisted of counseling and education dealing with the complex and emotionally intense issues of symptom management and palliative care in the setting of serious and potentially life-threatening illness. Greater than 50%  of this time was spent counseling and coordinating care related to the above assessment and plan.  Walden Field, NP Palliative Medicine Team  Team Phone # [phone] (Nights/Weekends)  12/11/2020, 8:17 AM

## 2021-11-20 DIAGNOSIS — A419 Sepsis, unspecified organism: Secondary | ICD-10-CM | POA: Diagnosis not present

## 2021-11-20 DIAGNOSIS — R6520 Severe sepsis without septic shock: Secondary | ICD-10-CM | POA: Diagnosis not present

## 2021-11-20 LAB — CBC
HCT: 26.9 % — ABNORMAL LOW (ref 36.0–46.0)
Hemoglobin: 8.3 g/dL — ABNORMAL LOW (ref 12.0–15.0)
MCH: 27.8 pg (ref 26.0–34.0)
MCHC: 30.9 g/dL (ref 30.0–36.0)
MCV: 90 fL (ref 80.0–100.0)
Platelets: 198 10*3/uL (ref 150–400)
RBC: 2.99 MIL/uL — ABNORMAL LOW (ref 3.87–5.11)
RDW: 18.8 % — ABNORMAL HIGH (ref 11.5–15.5)
WBC: 12.3 10*3/uL — ABNORMAL HIGH (ref 4.0–10.5)
nRBC: 0 % (ref 0.0–0.2)

## 2021-11-20 LAB — BASIC METABOLIC PANEL
Anion gap: 9 (ref 5–15)
BUN: 21 mg/dL (ref 8–23)
CO2: 25 mmol/L (ref 22–32)
Calcium: 8.3 mg/dL — ABNORMAL LOW (ref 8.9–10.3)
Chloride: 98 mmol/L (ref 98–111)
Creatinine, Ser: 1.06 mg/dL — ABNORMAL HIGH (ref 0.44–1.00)
GFR, Estimated: 59 mL/min — ABNORMAL LOW (ref >60)
Glucose, Bld: 115 mg/dL — ABNORMAL HIGH (ref 70–99)
Potassium: 3.8 mmol/L (ref 3.5–5.1)
Sodium: 132 mmol/L — ABNORMAL LOW (ref 135–145)

## 2021-11-20 LAB — CULTURE, BLOOD (ROUTINE X 2)
Culture: NO GROWTH
Culture: NO GROWTH

## 2021-11-20 MED ORDER — APIXABAN 5 MG PO TABS: 5.0000 mg | ORAL_TABLET | Freq: Two times a day (BID) | ORAL | 0 refills | Status: AC

## 2021-11-20 MED ORDER — APIXABAN 5 MG PO TABS: 10.0000 mg | ORAL_TABLET | Freq: Two times a day (BID) | ORAL | 0 refills | Status: AC

## 2021-11-20 MED ORDER — MIDODRINE HCL 5 MG PO TABS: 5.0000 mg | ORAL_TABLET | Freq: Three times a day (TID) | ORAL | 0 refills | Status: AC

## 2021-11-20 MED ORDER — ALBUTEROL SULFATE (2.5 MG/3ML) 0.083% IN NEBU: 2.5000 mg | INHALATION_SOLUTION | Freq: Four times a day (QID) | RESPIRATORY_TRACT | 12 refills | Status: DC | PRN

## 2021-11-20 MED ORDER — ENSURE ENLIVE PO LIQD: 237.0000 mL | Freq: Three times a day (TID) | ORAL | 12 refills | Status: DC

## 2021-11-20 NOTE — Discharge Summary (Signed)
Physician Discharge Summary  Michele Romero:[id number] DOB: 1961/02/29 DOA: 11/15/2021  PCP: Patient, No Pcp Per  Admit date: 11/15/2021  Discharge date: 11/20/2021  Admitted From:Home  Disposition:  Home with hospice  Recommendations for Outpatient Follow-up:  Follow up with hospice agency Continue medications as noted below with Eliquis prescribed for right lower extremity DVT  Home Health: None  Equipment/Devices: None  Discharge Condition:Stable  CODE STATUS: Full  Diet recommendation: Heart Healthy  Brief/Interim Summary: Michele Romero is a 61 y.o. female with medical history significant of endometrial cancer, COPD, GERD, migraines, hypertension, and more presented to the ED with a chief complaint of headaches, stomach cramping, leg swelling.  Patient reports that she started a new chemotherapy on Monday.  Her symptoms started on Tuesday.  She was admitted with sepsis, present on admission secondary to UTI and has completed 5-day course of Rocephin with no further symptoms.  Urine culture with multiple species noted.  She was also noted to have DVT to her right lower extremity and was started on IV heparin infusion which has now been transitioned to Eliquis.  Additionally, she had anemia that required 1 unit PRBC transfusion and follow-up hemoglobins have remained stable.  Her anemia was likely chemotherapy related with no overt bleeding identified.  She was followed by oncology as well as palliative care and decision was made to discontinue further chemotherapy for her endometrial cancer given her poor tolerance of treatment.  She was seen by palliative care and discussions were had with family members regarding home with hospice and she is agreeable to this care plan, but remains full code at this time.  No other acute events or concerns noted and she is stable for discharge.  Discharge Diagnoses:  Principal Problem:   Sepsis ([place]) Active Problems:   Essential hypertension    Endometrial cancer ([place])   Anemia   GERD (gastroesophageal reflux disease)   UTI (urinary tract infection)   Anasarca   Protein calorie malnutrition ([facility])   DVT, lower extremity, proximal, acute, right ([place])  Principle discharge diagnosis: Sepsis, present on admission, secondary to UTI.  Right lower extremity DVT.  Chemotherapy associated anemia status post 1 unit PRBC.  Endometrial cancer with plans to pursue home hospice.  Discharge Instructions  Discharge Instructions     Diet - low sodium heart healthy   Complete by: As directed    Increase activity slowly   Complete by: As directed       Allergies as of 11/20/2021       Reactions   Ivp Dye [iodinated Contrast Media] Nausea And Vomiting   Patient had an episode of projectile vomiting after IV injection. Patient states it happens every time she has IV contrast.     Codeine Nausea And Vomiting, Rash        Medication List     STOP taking these medications    lenvatinib 14 mg daily dose 10 & 4 MG capsule Commonly known as: [place] these medications    acetaminophen 500 MG tablet Commonly known as: TYLENOL Take 1,000 mg by mouth every 6 (six) hours as needed for moderate pain.   albuterol 108 (90 Base) MCG/ACT inhaler Commonly known as: VENTOLIN HFA Inhale 2 puffs into the lungs every 6 (six) hours as needed for wheezing or shortness of breath. What changed: Another medication with the same name was added. Make sure you understand how and when to take each.   albuterol (2.5 MG/3ML)  0.083% nebulizer solution Commonly known as: PROVENTIL Take 3 mLs (2.5 mg total) by nebulization every 6 (six) hours as needed for wheezing or shortness of breath. What changed: You were already taking a medication with the same name, and this prescription was added. Make sure you understand how and when to take each.   apixaban 5 MG Tabs tablet Commonly known as: ELIQUIS Take 2 tablets (10 mg total) by mouth 2 (two)  times daily for 7 days.   apixaban 5 MG Tabs tablet Commonly known as: ELIQUIS Take 1 tablet (5 mg total) by mouth 2 (two) times daily. Start taking on: November 26, 2021   cyanocobalamin 500 MCG tablet Commonly known as: VITAMIN B12 Take 500 mcg by mouth daily.   feeding supplement Liqd Take 237 mLs by mouth 3 (three) times daily between meals.   gabapentin 300 MG capsule Commonly known as: NEURONTIN Take 1 capsule (300 mg total) by mouth 3 (three) times daily. Take additional capsule at bed time for total dose '600mg'$    HYDROcodone-acetaminophen 5-325 MG tablet Commonly known as: NOR[place]/VICODIN Take 1 tablet by mouth every 6 hours as needed for moderate pain.   loperamide 2 MG capsule Commonly known as: IMODIUM Take 1 capsule (2 mg total) by mouth as needed for diarrhea or loose stools (Take 2 Capsules after the 1st loose stool and then 1 capsule adter each loose stool. Do not exceed 8 capsules in a 24-hr period. If it is bedtime and you are having loose stools, take 2 capsules at bestime and then take 2 capsules every 4 hours until morning.).   magnesium oxide 400 (240 Mg) MG tablet Commonly known as: MAG-OX Take 1 tablet (400 mg total) by mouth in the morning, at noon, and at bedtime.   midodrine 5 MG tablet Commonly known as: PROAMATINE Take 1 tablet (5 mg total) by mouth 3 (three) times daily with meals.   prochlorperazine 10 MG tablet Commonly known as: COMPAZINE Take 1 tablet (10 mg total) by mouth every 6 (six) hours as needed for nausea or vomiting.        Allergies  Allergen Reactions   Ivp Dye [Iodinated Contrast Media] Nausea And Vomiting    Patient had an episode of projectile vomiting after IV injection. Patient states it happens every time she has IV contrast.     Codeine Nausea And Vomiting and Rash    Consultations: Oncology Palliative care   Procedures/Studies: CT ABDOMEN PELVIS WO CONTRAST  Result Date: 11/18/2021 CLINICAL DATA:  Abdominal pain  and distension EXAM: CT ABDOMEN AND PELVIS WITHOUT CONTRAST TECHNIQUE: Multidetector CT imaging of the abdomen and pelvis was performed following the standard protocol without IV contrast. RADIATION DOSE REDUCTION: This exam was performed according to the departmental dose-optimization program which includes automated exposure control, adjustment of the mA and/or kV according to patient size and/or use of iterative reconstruction technique. COMPARISON:  11/16/2021 FINDINGS: Lower chest: Stable right lower lobe atelectasis and trace bilateral effusions. Hepatobiliary: Unremarkable unenhanced appearance of the liver. Gallbladder is surgically absent. Pancreas: Unremarkable unenhanced appearance. Spleen: Unremarkable unenhanced appearance. Adrenals/Urinary Tract: Continued right-sided hydronephrosis related to mass effect from the known large pelvic mass. Left kidney is unremarkable. No urinary tract calculi. The adrenals are stable. Stable mass effect upon the urinary bladder. Stomach/Bowel: There is distension of the distal thoracic esophagus with oral contrast, which may reflect reflux. At the time of imaging, oral contrast is seen filling nondilated loops of small bowel. Distal small bowel is incompletely opacified, limiting  evaluation. Moderate gas and stool throughout the colon unchanged since prior exam. No evidence of bowel obstruction at this time. Vascular/Lymphatic: Stable mild aortic atherosclerosis. No discrete adenopathy identified on this limited unenhanced exam. Reproductive: No change in the large complex pelvic mass consistent with known endometrial carcinoma. Significant mass effect upon the adjacent bowel, bladder, and right ureter. Other: Trace ascites. No free intraperitoneal gas. There is diffuse body wall edema consistent with anasarca, stable. Musculoskeletal: Right greater than left hip osteoarthritis. Stable lower lumbar degenerative changes. No acute or destructive bony lesions. IMPRESSION:  1. Progression of oral contrast into normal caliber mid jejunal loops as above, with no evidence of high-grade obstruction. There is significant mass effect upon the distal small bowel by the known pelvic mass, but I do not see any evidence of small-bowel obstruction at this time. 2. Large complex pelvic mass consistent with known history of endometrial cancer. 3. Persistent right hydronephrosis due to extrinsic compression of the right ureter by the large pelvic mass. No change since prior exam. 4. Dilated distal thoracic esophagus containing oral contrast, which may reflect gastroesophageal reflux. 5. Trace ascites. 6. Diffuse anasarca. Electronically Signed   By: Randa Ngo M.D.   On: 11/18/2021 23:07   CT Angio Chest Pulmonary Embolism (PE) W or WO Contrast  Result Date: 11/16/2021 CLINICAL DATA:  Headaches with abdominal cramping and leg swelling. Endometrial cancer with recent change in chemotherapy. Pulmonary embolism suspected. Iliac artery dissection/thrombus. * Tracking Code: BO * EXAM: CT ANGIOGRAPHY CHEST, ABDOMEN AND PELVIS TECHNIQUE: Multidetector CT imaging through the chest, abdomen and pelvis was performed using the standard protocol during bolus administration of intravenous contrast. Multiplanar reconstructed images and MIPs were obtained and reviewed to evaluate the vascular anatomy. RADIATION DOSE REDUCTION: This exam was performed according to the departmental dose-optimization program which includes automated exposure control, adjustment of the mA and/or kV according to patient size and/or use of iterative reconstruction technique. CONTRAST:  165m OMNIPAQUE IOHEXOL 350 MG/ML SOLN COMPARISON:  Prior CTA of the chest, abdomen and pelvis 10/25/2021. Lower extremity venous Doppler ultrasound today. FINDINGS: CTA CHEST FINDINGS Cardiovascular: The pulmonary arteries are well opacified with contrast to the level of the subsegmental branches. There is no evidence of acute pulmonary embolism.  Atherosclerosis of the aorta, great vessels and coronary arteries. No evidence of aneurysm or dissection. Right IJ Port-A-Cath extends to the mid right atrial level. The heart size is normal. There is no pericardial effusion. Mediastinum/Nodes: There are no enlarged mediastinal, hilar or axillary lymph nodes.Small hiatal hernia. The thyroid gland and trachea appear unremarkable. Lungs/Pleura: No pleural effusion or pneumothorax. New dependent right lower lobe airspace disease with mild volume loss which could reflect atelectasis or an early infiltrate. Mild central airway thickening. No suspicious pulmonary nodules. Musculoskeletal/Chest wall: There is no chest wall mass or suspicious osseous finding. Multilevel spondylosis with stable endplate degenerative changes in the thoracic spine. Review of the MIP images confirms the above findings. CTA ABDOMEN AND PELVIS FINDINGS VASCULAR Aorta: Mild atherosclerosis without aneurysm, dissection or stenosis. Celiac: Normal. SMA: Normal. Renals: Normal IMA: Normal Inflow: Mild atherosclerosis without evidence of dissection, thrombus or stenosis. Veins: Suboptimally evaluated in the arterial phase. Potential chronic compression of the iliac veins by the large pelvic mass. Review of the MIP images confirms the above findings. NON-VASCULAR Hepatobiliary: No focal abnormality as imaged in the arterial phase. Previous cholecystectomy without evidence of biliary dilatation. Pancreas: Unremarkable. No pancreatic ductal dilatation or surrounding inflammatory changes. Spleen: Normal in size without focal abnormality.  Adrenals/Urinary Tract: Both adrenal glands appear normal. The left kidney appears normal. The right kidney demonstrates decreased enhancement with mildly increased hydronephrosis attributed to the large pelvic mass. No urinary tract calculus or perinephric fluid collection identified. The bladder is mildly compressed by the large pelvic mass, but demonstrates no focal  abnormality. Stomach/Bowel: No enteric contrast administered. As above, small hiatal hernia. The stomach otherwise appears unremarkable for its degree of distention. Bowel loops remain displaced out of the pelvis by the large pelvic mass. There are scattered fluid levels within the bowel which do not appear significantly dilated. No evidence of bowel wall thickening or focal surrounding inflammation. Lymphatic: Stable mildly prominent inguinal and iliac lymph nodes bilaterally. No retroperitoneal adenopathy identified. Reproductive: Progressive enlargement of a very large pelvic mass status post hysterectomy. This currently measures approximately 21.6 x 18.0 x 26.4 (previously 16.8 x 14.7 x 24.1 cm). As above, this displaces the bowel out of the pelvis and pushes the rectum posteriorly. As above, this may certainly compress the iliac veins and predispose to DVT, suboptimally evaluated on this arterial phase CTA. Other: Trace ascites. Increased generalized soft tissue edema consistent with anasarca. No free air. Musculoskeletal: No acute or significant osseous findings. Multilevel spondylosis with degenerative anterolisthesis at L4-5. Review of the MIP images confirms the above findings. IMPRESSION: 1. No acute arterial findings are identified within the chest, abdomen or pelvis. No evidence of pulmonary embolism, aneurysm or dissection. 2. Venous assessment is limited on this arterial phase CTA. In this patient with known right common femoral vein DVT and a large pelvic mass, the possibility of iliac vein compression and thrombosis certainly exists. That would be better assessed with routine abdominopelvic CT to include delayed images. 3. The large pelvic mass has enlarged from the prior study of less than 1 month ago, consistent with aggressive recurrence of endometrial carcinoma. 4. Progressive right-sided hydronephrosis and decreased right renal enhancement attributed to ureteral compression by the enlarging  pelvic mass. 5. New dependent airspace disease with mild volume loss at the right lung base which may reflect atelectasis or pneumonia. 6. Increased generalized soft tissue edema consistent with anasarca. Electronically Signed   By: Richardean Sale M.D.   On: 11/16/2021 11:27   CT Angio Abd/Pel w/ and/or w/o  Result Date: 11/16/2021 CLINICAL DATA:  Headaches with abdominal cramping and leg swelling. Endometrial cancer with recent change in chemotherapy. Pulmonary embolism suspected. Iliac artery dissection/thrombus. * Tracking Code: BO * EXAM: CT ANGIOGRAPHY CHEST, ABDOMEN AND PELVIS TECHNIQUE: Multidetector CT imaging through the chest, abdomen and pelvis was performed using the standard protocol during bolus administration of intravenous contrast. Multiplanar reconstructed images and MIPs were obtained and reviewed to evaluate the vascular anatomy. RADIATION DOSE REDUCTION: This exam was performed according to the departmental dose-optimization program which includes automated exposure control, adjustment of the mA and/or kV according to patient size and/or use of iterative reconstruction technique. CONTRAST:  192m OMNIPAQUE IOHEXOL 350 MG/ML SOLN COMPARISON:  Prior CTA of the chest, abdomen and pelvis 10/25/2021. Lower extremity venous Doppler ultrasound today. FINDINGS: CTA CHEST FINDINGS Cardiovascular: The pulmonary arteries are well opacified with contrast to the level of the subsegmental branches. There is no evidence of acute pulmonary embolism. Atherosclerosis of the aorta, great vessels and coronary arteries. No evidence of aneurysm or dissection. Right IJ Port-A-Cath extends to the mid right atrial level. The heart size is normal. There is no pericardial effusion. Mediastinum/Nodes: There are no enlarged mediastinal, hilar or axillary lymph nodes.Small hiatal hernia. The thyroid  gland and trachea appear unremarkable. Lungs/Pleura: No pleural effusion or pneumothorax. New dependent right lower lobe  airspace disease with mild volume loss which could reflect atelectasis or an early infiltrate. Mild central airway thickening. No suspicious pulmonary nodules. Musculoskeletal/Chest wall: There is no chest wall mass or suspicious osseous finding. Multilevel spondylosis with stable endplate degenerative changes in the thoracic spine. Review of the MIP images confirms the above findings. CTA ABDOMEN AND PELVIS FINDINGS VASCULAR Aorta: Mild atherosclerosis without aneurysm, dissection or stenosis. Celiac: Normal. SMA: Normal. Renals: Normal IMA: Normal Inflow: Mild atherosclerosis without evidence of dissection, thrombus or stenosis. Veins: Suboptimally evaluated in the arterial phase. Potential chronic compression of the iliac veins by the large pelvic mass. Review of the MIP images confirms the above findings. NON-VASCULAR Hepatobiliary: No focal abnormality as imaged in the arterial phase. Previous cholecystectomy without evidence of biliary dilatation. Pancreas: Unremarkable. No pancreatic ductal dilatation or surrounding inflammatory changes. Spleen: Normal in size without focal abnormality. Adrenals/Urinary Tract: Both adrenal glands appear normal. The left kidney appears normal. The right kidney demonstrates decreased enhancement with mildly increased hydronephrosis attributed to the large pelvic mass. No urinary tract calculus or perinephric fluid collection identified. The bladder is mildly compressed by the large pelvic mass, but demonstrates no focal abnormality. Stomach/Bowel: No enteric contrast administered. As above, small hiatal hernia. The stomach otherwise appears unremarkable for its degree of distention. Bowel loops remain displaced out of the pelvis by the large pelvic mass. There are scattered fluid levels within the bowel which do not appear significantly dilated. No evidence of bowel wall thickening or focal surrounding inflammation. Lymphatic: Stable mildly prominent inguinal and iliac lymph  nodes bilaterally. No retroperitoneal adenopathy identified. Reproductive: Progressive enlargement of a very large pelvic mass status post hysterectomy. This currently measures approximately 21.6 x 18.0 x 26.4 (previously 16.8 x 14.7 x 24.1 cm). As above, this displaces the bowel out of the pelvis and pushes the rectum posteriorly. As above, this may certainly compress the iliac veins and predispose to DVT, suboptimally evaluated on this arterial phase CTA. Other: Trace ascites. Increased generalized soft tissue edema consistent with anasarca. No free air. Musculoskeletal: No acute or significant osseous findings. Multilevel spondylosis with degenerative anterolisthesis at L4-5. Review of the MIP images confirms the above findings. IMPRESSION: 1. No acute arterial findings are identified within the chest, abdomen or pelvis. No evidence of pulmonary embolism, aneurysm or dissection. 2. Venous assessment is limited on this arterial phase CTA. In this patient with known right common femoral vein DVT and a large pelvic mass, the possibility of iliac vein compression and thrombosis certainly exists. That would be better assessed with routine abdominopelvic CT to include delayed images. 3. The large pelvic mass has enlarged from the prior study of less than 1 month ago, consistent with aggressive recurrence of endometrial carcinoma. 4. Progressive right-sided hydronephrosis and decreased right renal enhancement attributed to ureteral compression by the enlarging pelvic mass. 5. New dependent airspace disease with mild volume loss at the right lung base which may reflect atelectasis or pneumonia. 6. Increased generalized soft tissue edema consistent with anasarca. Electronically Signed   By: Richardean Sale M.D.   On: 11/16/2021 11:27   US Venous Img Lower Bilateral (DVT)  Result Date: 11/16/2021 CLINICAL DATA:  Lower extremity edema, right greater than left. EXAM: BILATERAL LOWER EXTREMITY VENOUS DOPPLER ULTRASOUND  TECHNIQUE: Gray-scale sonography with graded compression, as well as color Doppler and duplex ultrasound were performed to evaluate the lower extremity deep venous systems from  the level of the common femoral vein and including the common femoral, femoral, profunda femoral, popliteal and calf veins including the posterior tibial, peroneal and gastrocnemius veins when visible. The superficial great saphenous vein was also interrogated. Spectral Doppler was utilized to evaluate flow at rest and with distal augmentation maneuvers in the common femoral, femoral and popliteal veins. COMPARISON:  None Available. FINDINGS: RIGHT LOWER EXTREMITY Common Femoral Vein: 2D grayscale imaging shows echogenic material within the vascular lumen consistent with nonocclusive thrombus. Augmentation is decreased. Saphenofemoral Junction: Echogenic material in the vascular lumen is consistent with thrombus. Vessel lacks compressibility and augmentation. Profunda Femoral Vein: No evidence of thrombus. Normal compressibility and flow on color Doppler imaging. Femoral Vein: No evidence of thrombus. Normal compressibility, respiratory phasicity and response to augmentation. Popliteal Vein: No evidence of thrombus. Normal compressibility, respiratory phasicity and response to augmentation. Calf Veins: Limited assessment with incomplete visualization. Other Findings:  Subcutaneous edema evident. LEFT LOWER EXTREMITY Common Femoral Vein: No evidence of thrombus. Normal compressibility, respiratory phasicity and response to augmentation. Saphenofemoral Junction: No evidence of thrombus. Normal compressibility and flow on color Doppler imaging. Profunda Femoral Vein: No evidence of thrombus. Normal compressibility and flow on color Doppler imaging. Femoral Vein: No evidence of thrombus. Normal compressibility, respiratory phasicity and response to augmentation. Popliteal Vein: No evidence of thrombus. Normal compressibility, respiratory  phasicity and response to augmentation. Calf Veins: No evidence of thrombus. Normal compressibility and flow on color Doppler imaging. Other Findings:  Subcutaneous edema evident. IMPRESSION: 1. Acute thrombus identified in the right common femoral vein and at the saphenofemoral junction consistent with right lower extremity DVT. 2. No evidence for DVT in the left lower extremity. These results will be called to the ordering clinician or representative by the Radiologist Assistant, and communication documented in the PACS or [employer]. Electronically Signed   By: Misty Stanley M.D.   On: 11/16/2021 10:18   Abd 1 View (KUB)  Result Date: 11/15/2021 CLINICAL DATA:  Abdominal distension EXAM: ABDOMEN - 1 VIEW COMPARISON:  10/25/2021 CT, plain film from 10/26/2021 FINDINGS: Scattered large and small bowel gas is noted. Significant soft tissue density is noted in the pelvis consistent with the known pelvic mass seen on prior CT examination. No bony abnormality is noted. No free air is seen. IMPRESSION: Stable soft tissue mass in the pelvis. No acute abnormality is noted. Electronically Signed   By: Inez Catalina M.D.   On: 11/15/2021 22:12   DG Chest Port 1 View  Result Date: 11/15/2021 CLINICAL DATA:  Abdominal pain, nausea, vomiting, patient on chemotherapy EXAM: PORTABLE CHEST 1 VIEW COMPARISON:  01/25/2021 FINDINGS: Cardiac size is within normal limits. Lung fields are clear of any infiltrates or pulmonary edema. There is no pleural effusion or pneumothorax. Tip of right chest port is seen at the junction of superior vena cava and right atrium. IMPRESSION: No active disease. Electronically Signed   By: Elmer Picker M.D.   On: 11/15/2021 16:53   US Venous Img Lower Unilateral Right (DVT)  Result Date: 10/26/2021 CLINICAL DATA:  Right lower extremity edema. EXAM: RIGHT LOWER EXTREMITY VENOUS DOPPLER ULTRASOUND TECHNIQUE: Gray-scale sonography with graded compression, as well as color Doppler and  duplex ultrasound were performed to evaluate the lower extremity deep venous systems from the level of the common femoral vein and including the common femoral, femoral, profunda femoral, popliteal and calf veins including the posterior tibial, peroneal and gastrocnemius veins when visible. The superficial great saphenous vein was also interrogated. Spectral Doppler  was utilized to evaluate flow at rest and with distal augmentation maneuvers in the common femoral, femoral and popliteal veins. COMPARISON:  None Available. FINDINGS: Contralateral Common Femoral Vein: Respiratory phasicity is normal and symmetric with the symptomatic side. No evidence of thrombus. Normal compressibility. Common Femoral Vein: No evidence of thrombus. Normal compressibility, respiratory phasicity and response to augmentation. Saphenofemoral Junction: Thrombus in the upper segment of the great saphenous vein extends to the saphenofemoral junction and just into the common femoral vein but does not cause any flow restriction or luminal stenosis of the common femoral vein. Profunda Femoral Vein: No evidence of thrombus. Normal compressibility and flow on color Doppler imaging. Femoral Vein: No evidence of thrombus. Normal compressibility, respiratory phasicity and response to augmentation. Popliteal Vein: No evidence of thrombus. Normal compressibility, respiratory phasicity and response to augmentation. Calf Veins: No evidence of thrombus. Normal compressibility and flow on color Doppler imaging. Superficial Great Saphenous Vein: Thrombus in the upper thigh segment of the great saphenous vein. More distal segments appear normally patent. Venous Reflux:  None. Other Findings:  No abnormal fluid collections. 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 Electronically Signed   By: Aletta Edouard M.D.   On: 10/26/2021 16:27   DG Abd 1 View  Result Date: 10/26/2021 CLINICAL DATA:  Abdominal pain and pelvic mass. EXAM: ABDOMEN - 1 VIEW COMPARISON:  10/25/2021 FINDINGS: No dilated bowel loops are noted. Nondistended gas-filled loops of small bowel and colon are noted. Increased density in the pelvis is noted displacing bowel loop superiorly, compatible with large pelvic mass identified on recent CT. No acute bony abnormalities are identified. Severe degenerative changes in the RIGHT hip again noted. IMPRESSION: 1. Increased density in the pelvis compatible with large pelvic mass identified on recent CT. No evidence of bowel obstruction on this study. Electronically Signed   By: Margarette Canada M.D.   On: 10/26/2021 08:44   CT Angio Chest/Abd/Pel for Dissection W and/or W/WO  Result Date: 10/25/2021 CLINICAL DATA:  Chest or back pain, aortic dissection suspected. History of endometrial cancer. * Tracking Code: BO * EXAM: CT ANGIOGRAPHY CHEST, ABDOMEN AND PELVIS TECHNIQUE: Non-contrast CT of the chest was initially obtained. Multidetector CT imaging through the chest, abdomen and pelvis was performed using the standard protocol during bolus administration of intravenous contrast. Multiplanar reconstructed images and MIPs were obtained and reviewed to evaluate the vascular anatomy. RADIATION DOSE REDUCTION: This exam was performed according to the departmental dose-optimization program which includes automated exposure control, adjustment of the mA and/or kV according to patient size and/or use of iterative reconstruction technique. CONTRAST:  53m OMNIPAQUE IOHEXOL 350 MG/ML SOLN COMPARISON:  Multiple prior including CTs October 09, 2021 September 17, 2021 and July 02, 2021. FINDINGS: CTA CHEST FINDINGS Cardiovascular: Noncontrast sequence demonstrates aortic atherosclerosis without intramural hematoma. Accessed right chest  Port-A-Cath with tip near the superior cavoatrial junction. Preferential opacification of the thoracic aorta. No evidence of thoracic aortic aneurysm or dissection. No central pulmonary embolus on this nondedicated study. Normal heart size. No significant pericardial effusion/thickening. Mediastinum/Nodes: Pathologically enlarged mediastinal, hilar or axillary lymph nodes. Small hiatal hernia. Lungs/Pleura: No suspicious pulmonary nodules or masses. No focal airspace consolidation. Pleural effusion. No pneumothorax. Musculoskeletal: Thoracic spondylosis. No suspicious chest wall mass. Review of the MIP images confirms the above findings.  CTA ABDOMEN AND PELVIS FINDINGS VASCULAR Aorta: Normal caliber aorta without aneurysm, dissection, vasculitis or significant stenosis. Celiac: Patent without evidence of aneurysm, dissection, vasculitis or significant stenosis. SMA: Patent without evidence of aneurysm, dissection, vasculitis or significant stenosis. Renals: Both renal arteries are patent without evidence of aneurysm, dissection, vasculitis, fibromuscular dysplasia or significant stenosis. IMA: Patent without evidence of aneurysm, dissection, vasculitis or significant stenosis. Inflow: Patent without evidence of aneurysm, dissection, vasculitis or significant stenosis. Veins: No obvious venous abnormality within the limitations of this arterial phase study. Review of the MIP images confirms the above findings. NON-VASCULAR Hepatobiliary: No suspicious hepatic lesion. Gallbladder surgically absent. No biliary ductal dilation. Pancreas: No pancreatic ductal dilation or evidence of acute inflammation. Spleen: No splenomegaly or focal splenic lesion. Adrenals/Urinary Tract: Bilateral adrenal glands appear normal. Prominence of the right renal collecting system and proximal ureter to the level of the large pelvic mass with delayed right renal enhancement. Left kidney is unremarkable. No suspicious renal mass. Urinary  bladder is minimally distended limiting evaluation. Stomach/Bowel: No radiopaque enteric contrast material was administered. Small hiatal hernia otherwise the stomach is unremarkable for degree of distension. Effacement and multifocal narrowing of large and small bowel loops by the large pelvic mass. Prominent loops of small bowel in the right hemiabdomen with interposed effacement and multifocal narrowing of small-bowel loops Lymphatic: Enlarged iliac side chain and inguinal lymph nodes. For reference a left inguinal lymph node measures 11 mm in short axis on image 185/6 previously 6 mm in short axis, and a left external iliac lymph node measures 9 mm in short axis on image 165/6 previously 7 mm in short axis. No abdominal adenopathy. Reproductive: Prior hysterectomy. There is a large heterogeneous mass extending from the vaginal cuff into the abdomen which appears to demonstrate nodular areas postcontrast enhancement and measures 17.3 x 14.7 x 24.1 cm on images 151/6 and 109/11. The adnexa are obscured by the large pelvic mass. Other: Trace right-sided abdominopelvic free fluid with possible nodular peritoneal thickening. Nonspecific subcutaneous edema. Musculoskeletal: Multilevel degenerative change of the spine. No aggressive lytic or blastic lesion of bone. Severe right hip degenerative change Review of the MIP images confirms the above findings. IMPRESSION: 1. 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 2. Increased size of prominent/mildly enlarged iliac side chain and inguinal lymph nodes which are suspicious for disease involvement. 3. Prominent loops of small bowel in the right hemiabdomen with interposed loops of nondilated bowel, no upstream dilation of small bowel or discrete abrupt transition to decompressed bowel, favored to be reactive or reflect focal enteritis. However  an early/partial small bowel obstruction related to the large pelvic mass can not be excluded, recommend close clinical interval follow-up. 4. Right-sided obstructive uropathy related to the large pelvic mass. Consider correlation with laboratory values to exclude superimposed infection. 5. Trace right-sided abdominopelvic free fluid possible nodular peritoneal thickening but poorly evaluated on this examination. These results were called by telephone at the time of interpretation on 10/25/2021 at 2:59 pm to provider Dr. Langston Masker, who verbally acknowledged these results. Electronically Signed   By: Dahlia Bailiff M.D.   On: 10/25/2021 15:30   CT Head Wo Contrast  Result Date: 10/25/2021 CLINICAL DATA:  Neuro deficit EXAM: CT HEAD WITHOUT CONTRAST TECHNIQUE: Contiguous axial images were obtained from the base of the skull through the vertex without intravenous contrast. RADIATION DOSE REDUCTION: This exam was performed  according to the departmental dose-optimization program which includes automated exposure control, adjustment of the mA and/or kV according to patient size and/or use of iterative reconstruction technique. COMPARISON:  Head CT dated July 27, 2014 FINDINGS: Brain: No evidence of acute infarction, hemorrhage, hydrocephalus, extra-axial collection or mass lesion/mass effect. Vascular: No hyperdense vessel or unexpected calcification. Skull: Normal. Negative for fracture or focal lesion. Sinuses/Orbits: No acute finding. Other: None. IMPRESSION: No acute intracranial abnormality. Electronically Signed   By: Yetta Glassman M.D.   On: 10/25/2021 14:42     Discharge Exam: Vitals:   11/20/21 0804 11/20/21 0900  BP:  92/69  Pulse:  (!) 118  Resp: 17 14  Temp: 97.7 F (36.5 C)   SpO2: 99% 98%   Vitals:   11/20/21 0707 11/20/21 0800 11/20/21 0804 11/20/21 0900  BP: 91/69 (!) 88/69  92/69  Pulse: (!) 121 (!) 116  (!) 118  Resp: '19 19 17 14  '$ Temp:   97.7 F (36.5 C)   TempSrc:   Oral    SpO2: 99% 99% 99% 98%  Weight:      Height:        General: Pt is alert, awake, not in acute distress Cardiovascular: RRR, S1/S2 +, no rubs, no gallops Respiratory: CTA bilaterally, no wheezing, no rhonchi, nasal cannula oxygen Abdominal: Soft, NT, ND, bowel sounds + Extremities: no edema, no cyanosis    The results of significant diagnostics from this hospitalization (including imaging, microbiology, ancillary and laboratory) are listed below for reference.     Microbiology: Recent Results (from the past 240 hour(s))  Resp Panel by RT-PCR (Flu A&B, Covid) Anterior Nasal Swab     Status: None   Collection Time: 11/15/21  4:25 PM   Specimen: Anterior Nasal Swab  Result Value Ref Range Status   SARS Coronavirus 2 by RT PCR NEGATIVE NEGATIVE Final    Comment: (NOTE) SARS-CoV-2 target nucleic acids are NOT DETECTED.  The SARS-CoV-2 RNA is generally detectable in upper respiratory specimens during the acute phase of infection. The lowest concentration of SARS-CoV-2 viral copies this assay can detect is 138 copies/mL. A negative result does not preclude SARS-Cov-2 infection and should not be used as the sole basis for treatment or other patient management decisions. A negative result may occur with  improper specimen collection/handling, submission of specimen other than nasopharyngeal swab, presence of viral mutation(s) within the areas targeted by this assay, and inadequate number of viral copies(<138 copies/mL). A negative result must be combined with clinical observations, patient history, and epidemiological information. The expected result is Negative.  Fact Sheet for Patients:  EntrepreneurPulse.com.au  Fact Sheet for Healthcare Providers:  IncredibleEmployment.be  This test is no t yet approved or cleared by the [place] FDA and  has been authorized for detection and/or diagnosis of SARS-CoV-2 by FDA under an Emergency Use  Authorization (EUA). This EUA will remain  in effect (meaning this test can be used) for the duration of the COVID-19 declaration under Section 564(b)(1) of the Act, 21 U.S.C.section 360bbb-3(b)(1), unless the authorization is terminated  or revoked sooner.       Influenza A by PCR NEGATIVE NEGATIVE Final   Influenza B by PCR NEGATIVE NEGATIVE Final    Comment: (NOTE) The Xpert Xpress SARS-CoV-2/FLU/RSV plus assay is intended as an aid in the diagnosis of influenza from Nasopharyngeal swab specimens and should not be used as a sole basis for treatment. Nasal washings and aspirates are unacceptable for Xpert Xpress SARS-CoV-2/FLU/RSV testing.  Fact  Sheet for Patients: EntrepreneurPulse.com.au  Fact Sheet for Healthcare Providers: IncredibleEmployment.be  This test is not yet approved or cleared by the [place] FDA and has been authorized for detection and/or diagnosis of SARS-CoV-2 by FDA under an Emergency Use Authorization (EUA). This EUA will remain in effect (meaning this test can be used) for the duration of the COVID-19 declaration under Section 564(b)(1) of the Act, 21 U.S.C. section 360bbb-3(b)(1), unless the authorization is terminated or revoked.  Performed at [facility], [address] [place], [place] [address]   Urine Culture     Status: Abnormal   Collection Time: 11/15/21  4:25 PM   Specimen: In/Out Cath Urine  Result Value Ref Range Status   Specimen Description   Final    IN/OUT CATH URINE Performed at [facility], [address] [place], [place] [address]    Special Requests   Final    Immunocompromised Performed at [facility], [address] [place], [place] [address]    Culture MULTIPLE SPECIES PRESENT, SUGGEST RECOLLECTION (A)  Final   Report Status 11/17/2021 FINAL  Final  Blood Culture (routine x 2)     Status: None   Collection Time: 11/15/21  4:50 PM   Specimen: BLOOD RIGHT HAND  Result Value Ref  Range Status   Specimen Description   Final    BLOOD RIGHT HAND BOTTLES DRAWN AEROBIC AND ANAEROBIC   Special Requests   Final    Blood Culture results may not be optimal due to an excessive volume of blood received in culture bottles   Culture   Final    NO GROWTH 5 DAYS Performed at [facility], [address], [place], [place] [address]    Report Status 11/20/2021 FINAL  Final  Blood Culture (routine x 2)     Status: None   Collection Time: 11/15/21  4:50 PM   Specimen: Right Antecubital; Blood  Result Value Ref Range Status   Specimen Description   Final    RIGHT ANTECUBITAL BOTTLES DRAWN AEROBIC AND ANAEROBIC   Special Requests   Final    Blood Culture results may not be optimal due to an excessive volume of blood received in culture bottles   Culture   Final    NO GROWTH 5 DAYS Performed at [facility], [address] [place],  [address]    Report Status 11/20/2021 FINAL  Final  MRSA Next Gen by PCR, Nasal     Status: None   Collection Time: 11/15/21  8:58 PM   Specimen: Nasal Mucosa; Nasal Swab  Result Value Ref Range Status   MRSA by PCR Next Gen NOT DETECTED NOT DETECTED Final    Comment: (NOTE) The GeneXpert MRSA Assay (FDA approved for NASAL specimens only), is one component of a comprehensive MRSA colonization surveillance program. It is not intended to diagnose MRSA infection nor to guide or monitor treatment for MRSA infections. Test performance is not FDA approved in patients less than 68 years old. Performed at [facility], [address], [place],  [address]      Labs: BNP (last 3 results) Recent Labs    11/15/21 1650  BNP 32.9   Basic Metabolic Panel: Recent Labs  Lab 11/16/21 0531 11/16/21 0820 11/17/21 0514 11/18/21 0605 11/19/21 0116 11/20/21 0404  NA 134*  --  134* 136 133* 132*  K 4.0  --  3.5 4.2 3.8 3.8  CL 103  --  103 104 102 98  CO2 24  --  25  $'25 24 25  'U$ GLUCOSE 91  --  89 94 140* 115*  BUN 13  --  '11 12 14 21   '$ CREATININE 0.80  --  0.77 0.78 0.82 1.06*  CALCIUM 8.1*  --  7.9* 8.3* 8.0* 8.3*  MG 1.7  --  1.5* 2.2 1.9  --   PHOS  --  3.5 3.2 2.6 2.3*  --    Liver Function Tests: Recent Labs  Lab 11/15/21 1650 11/16/21 0531 11/18/21 0605 11/19/21 0116  AST 34 29  --   --   ALT 9 9  --   --   ALKPHOS 61 59  --   --   BILITOT 0.7 0.7  --   --   PROT 7.1 6.4*  --   --   ALBUMIN 2.3* 2.0* 2.8* 3.1*   Recent Labs  Lab 11/15/21 1650  LIPASE 24   No results for input(s): "AMMONIA" in the last 168 hours. CBC: Recent Labs  Lab 11/16/21 0531 11/16/21 1702 11/17/21 0514 11/18/21 0605 11/19/21 0116 11/20/21 0404  WBC 6.6  --  6.6 7.4 12.2* 12.3*  NEUTROABS 4.1  --   --   --   --   --   HGB 6.8* 8.6* 8.8* 7.9* 8.2* 8.3*  HCT 21.6* 26.5* 27.7* 25.3* 26.1* 26.9*  MCV 87.4  --  88.2 88.8 88.2 90.0  PLT 173  --  203 194 202 198   Cardiac Enzymes: No results for input(s): "CKTOTAL", "CKMB", "CKMBINDEX", "TROPONINI" in the last 168 hours. BNP: Invalid input(s): "POCBNP" CBG: No results for input(s): "GLUCAP" in the last 168 hours. D-Dimer No results for input(s): "DDIMER" in the last 72 hours. Hgb A1c No results for input(s): "HGBA1C" in the last 72 hours. Lipid Profile No results for input(s): "CHOL", "HDL", "LDLCALC", "TRIG", "CHOLHDL", "LDLDIRECT" in the last 72 hours. Thyroid function studies No results for input(s): "TSH", "T4TOTAL", "T3FREE", "THYROIDAB" in the last 72 hours.  Invalid input(s): "FREET3" Anemia work up No results for input(s): "VITAMINB12", "FOLATE", "FERRITIN", "TIBC", "IRON", "RETICCTPCT" in the last 72 hours. Urinalysis    Component Value Date/Time   COLORURINE AMBER (A) 11/15/2021 1601   APPEARANCEUR TURBID (A) 11/15/2021 1601   LABSPEC 1.011 11/15/2021 1601   PHURINE 6.0 11/15/2021 1601   GLUCOSEU NEGATIVE 11/15/2021 1601   HGBUR MODERATE (A) 11/15/2021 1601   BILIRUBINUR NEGATIVE 11/15/2021 1601   KETONESUR NEGATIVE 11/15/2021 1601   PROTEINUR 30  (A) 11/15/2021 1601   UROBILINOGEN 0.2 10/14/2010 1536   NITRITE POSITIVE (A) 11/15/2021 1601   LEUKOCYTESUR LARGE (A) 11/15/2021 1601   Sepsis Labs Recent Labs  Lab 11/17/21 0514 11/18/21 0605 11/19/21 0116 11/20/21 0404  WBC 6.6 7.4 12.2* 12.3*   Microbiology Recent Results (from the past 240 hour(s))  Resp Panel by RT-PCR (Flu A&B, Covid) Anterior Nasal Swab     Status: None   Collection Time: 11/15/21  4:25 PM   Specimen: Anterior Nasal Swab  Result Value Ref Range Status   SARS Coronavirus 2 by RT PCR NEGATIVE NEGATIVE Final    Comment: (NOTE) SARS-CoV-2 target nucleic acids are NOT DETECTED.  The SARS-CoV-2 RNA is generally detectable in upper respiratory specimens during the acute phase of infection. The lowest concentration of SARS-CoV-2 viral copies this assay can detect is 138 copies/mL. A negative result does not preclude SARS-Cov-2 infection and should not be used as the sole basis for treatment or other patient management decisions. A negative result may occur with  improper specimen collection/handling, submission of  specimen other than nasopharyngeal swab, presence of viral mutation(s) within the areas targeted by this assay, and inadequate number of viral copies(<138 copies/mL). A negative result must be combined with clinical observations, patient history, and epidemiological information. The expected result is Negative.  Fact Sheet for Patients:  EntrepreneurPulse.com.au  Fact Sheet for Healthcare Providers:  IncredibleEmployment.be  This test is no t yet approved or cleared by the [place] FDA and  has been authorized for detection and/or diagnosis of SARS-CoV-2 by FDA under an Emergency Use Authorization (EUA). This EUA will remain  in effect (meaning this test can be used) for the duration of the COVID-19 declaration under Section 564(b)(1) of the Act, 21 U.S.C.section 360bbb-3(b)(1), unless the authorization  is terminated  or revoked sooner.       Influenza A by PCR NEGATIVE NEGATIVE Final   Influenza B by PCR NEGATIVE NEGATIVE Final    Comment: (NOTE) The Xpert Xpress SARS-CoV-2/FLU/RSV plus assay is intended as an aid in the diagnosis of influenza from Nasopharyngeal swab specimens and should not be used as a sole basis for treatment. Nasal washings and aspirates are unacceptable for Xpert Xpress SARS-CoV-2/FLU/RSV testing.  Fact Sheet for Patients: EntrepreneurPulse.com.au  Fact Sheet for Healthcare Providers: IncredibleEmployment.be  This test is not yet approved or cleared by the [place] FDA and has been authorized for detection and/or diagnosis of SARS-CoV-2 by FDA under an Emergency Use Authorization (EUA). This EUA will remain in effect (meaning this test can be used) for the duration of the COVID-19 declaration under Section 564(b)(1) of the Act, 21 U.S.C. section 360bbb-3(b)(1), unless the authorization is terminated or revoked.  Performed at [facility], [address], [place], [place] [address]   Urine Culture     Status: Abnormal   Collection Time: 11/15/21  4:25 PM   Specimen: In/Out Cath Urine  Result Value Ref Range Status   Specimen Description   Final    IN/OUT CATH URINE Performed at [facility], [address], [place], [place] [address]    Special Requests   Final    Immunocompromised Performed at [facility], [address], [place], [place] [address]    Culture MULTIPLE SPECIES PRESENT, SUGGEST RECOLLECTION (A)  Final   Report Status 11/17/2021 FINAL  Final  Blood Culture (routine x 2)     Status: None   Collection Time: 11/15/21  4:50 PM   Specimen: BLOOD RIGHT HAND  Result Value Ref Range Status   Specimen Description   Final    BLOOD RIGHT HAND BOTTLES DRAWN AEROBIC AND ANAEROBIC   Special Requests   Final    Blood Culture results may not be optimal due to an excessive volume of blood received in  culture bottles   Culture   Final    NO GROWTH 5 DAYS Performed at [facility], [address], [place], [place] [address]    Report Status 11/20/2021 FINAL  Final  Blood Culture (routine x 2)     Status: None   Collection Time: 11/15/21  4:50 PM   Specimen: Right Antecubital; Blood  Result Value Ref Range Status   Specimen Description   Final    RIGHT ANTECUBITAL BOTTLES DRAWN AEROBIC AND ANAEROBIC   Special Requests   Final    Blood Culture results may not be optimal due to an excessive volume of blood received in culture bottles   Culture   Final    NO GROWTH 5 DAYS Performed at [facility], [address] [place],  [place] [address]    Report Status 11/20/2021 FINAL  Final  MRSA Next Gen by PCR, Nasal     Status: None   Collection Time: 11/15/21  8:58 PM   Specimen: Nasal Mucosa; Nasal Swab  Result Value Ref Range Status   MRSA by PCR Next Gen NOT DETECTED NOT DETECTED Final    Comment: (NOTE) The GeneXpert MRSA Assay (FDA approved for NASAL specimens only), is one component of a comprehensive MRSA colonization surveillance program. It is not intended to diagnose MRSA infection nor to guide or monitor treatment for MRSA infections. Test performance is not FDA approved in patients less than 42 years old. Performed at [facility], [address], [place], [place] [address]      Time coordinating discharge: 35 minutes  SIGNED:   Rodena Goldmann, DO [facility] 11/20/2021, 10:38 AM  If 7PM-7AM, please contact night-coverage [email or web address]

## 2021-11-21 DIAGNOSIS — A419 Sepsis, unspecified organism: Secondary | ICD-10-CM | POA: Diagnosis not present

## 2021-11-21 DIAGNOSIS — R6520 Severe sepsis without septic shock: Secondary | ICD-10-CM | POA: Diagnosis not present

## 2021-11-21 LAB — CBC
HCT: 24.7 % — ABNORMAL LOW (ref 36.0–46.0)
Hemoglobin: 7.6 g/dL — ABNORMAL LOW (ref 12.0–15.0)
MCH: 27.6 pg (ref 26.0–34.0)
MCHC: 30.8 g/dL (ref 30.0–36.0)
MCV: 89.8 fL (ref 80.0–100.0)
Platelets: 187 10*3/uL (ref 150–400)
RBC: 2.75 MIL/uL — ABNORMAL LOW (ref 3.87–5.11)
RDW: 18.7 % — ABNORMAL HIGH (ref 11.5–15.5)
WBC: 12.6 10*3/uL — ABNORMAL HIGH (ref 4.0–10.5)
nRBC: 0 % (ref 0.0–0.2)

## 2021-11-21 MED ORDER — ONDANSETRON HCL 4 MG PO TABS
4.0000 mg | ORAL_TABLET | Freq: Four times a day (QID) | ORAL | 0 refills | Status: DC | PRN
Start: 2021-11-21 — End: 2022-11-24

## 2021-11-21 MED ORDER — HEPARIN SOD (PORK) LOCK FLUSH 100 UNIT/ML IV SOLN
500.0000 [IU] | Freq: Once | INTRAVENOUS | Status: AC
  Administered 2021-11-21: 500 [IU] via INTRAVENOUS
  Filled 2021-11-21: qty 5

## 2021-11-21 NOTE — TOC Transition Note (Signed)
Transition of Care [facility]) - CM/SW Discharge Note   Patient Details  Name: Michele Romero MRN: [id number] Date of Birth: 25-Nov-1960  [facility] CM/SW Contact:  Boneta Lucks, RN Phone Number: 11/21/2021, 1:06 PM   Clinical Narrative:   Hospice called, equipment has been delivered. Family want to come to hospital and transport patient home. RN updated.   Final next level of care: Home w Hospice Care Barriers to Discharge: Barriers Resolved   Patient Goals and CMS Choice Patient states their goals for this hospitalization and ongoing recovery are:: return home   Choice offered to / list presented to : Patient  Discharge Placement        Name of family member notified: Family Patient and family notified of of transfer: 11/21/21  Discharge Plan and Services In-house Referral: Clinical Social Work   Post Acute Care Choice: Resumption of Svcs/PTA Provider                 [facility] Agency: [place] ([place]) Date [place]: 11/18/21 Time [place]: [id number] Representative spoke with at [place]: Vaughan Basta   Readmission Risk Interventions    11/18/2021    8:38 AM  Readmission Risk Prevention Plan  Transportation Screening Complete  HRI or Home Care Consult Complete  Social Work Consult for [place] Planning/Counseling Complete  Palliative Care Screening Not Applicable  Medication Review Press photographer) Complete

## 2021-11-21 NOTE — Progress Notes (Signed)
Patient seen and evaluated this a.m. with no new complaints or concerns noted.  Please refer to discharge summary dictated 8/9 for full details regarding course of admission.  She is awaiting home equipment set up for home hospice.  Total care time: 10 minutes.

## 2021-11-28 ENCOUNTER — Inpatient Hospital Stay: Payer: 59

## 2021-11-28 ENCOUNTER — Inpatient Hospital Stay (HOSPITAL_COMMUNITY): Payer: 59 | Attending: Hematology | Admitting: Hematology

## 2021-12-12 ENCOUNTER — Ambulatory Visit: Payer: 59 | Admitting: Radiation Oncology

## 2021-12-12 ENCOUNTER — Encounter (HOSPITAL_COMMUNITY): Payer: Self-pay | Admitting: Hematology

## 2021-12-12 ENCOUNTER — Encounter: Payer: Self-pay | Admitting: Hematology

## 2021-12-13 DEATH — deceased

## 2021-12-14 ENCOUNTER — Other Ambulatory Visit: Payer: Self-pay | Admitting: Hematology

## 2021-12-19 ENCOUNTER — Inpatient Hospital Stay: Payer: 59

## 2021-12-26 ENCOUNTER — Encounter (HOSPITAL_COMMUNITY): Payer: Self-pay | Admitting: Hematology

## 2022-01-09 ENCOUNTER — Other Ambulatory Visit (HOSPITAL_COMMUNITY): Payer: 59

## 2022-01-09 ENCOUNTER — Other Ambulatory Visit: Payer: 59

## 2022-01-16 ENCOUNTER — Ambulatory Visit: Payer: 59 | Admitting: Hematology

## 2022-12-03 IMAGING — CT CT ABD-PELV W/ CM
2 of 6 series · 14 of 46 positions shown, 16 images · IV contrast (agent unspecified)
Comparison: 10/29/2020

CLINICAL DATA: Endometrial cancer, assess treatment response,
status post hysterectomy, chemotherapy, and radiation * Tracking
Code: BO *

EXAM:
CT ABDOMEN AND PELVIS WITH CONTRAST
TECHNIQUE: Multidetector CT imaging of the abdomen and pelvis was performed
using the standard protocol following bolus administration of
intravenous contrast.

[Series 2: axial st · axial · 0.79mm/px · z∈[+961,+1351]mm · 11 of 94 slices shown, 13 images]
[im 8/94  soft-tissue]
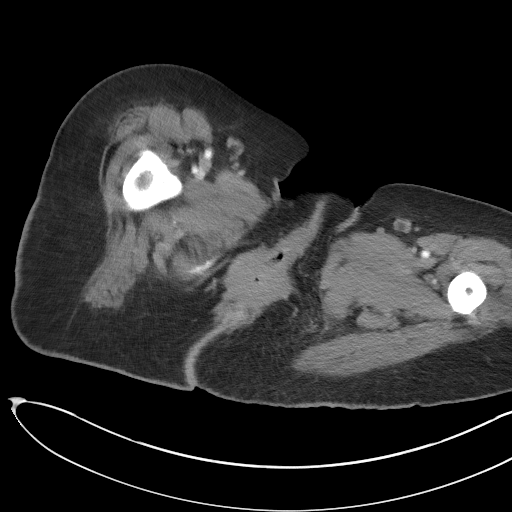
[im 8/94  bone]
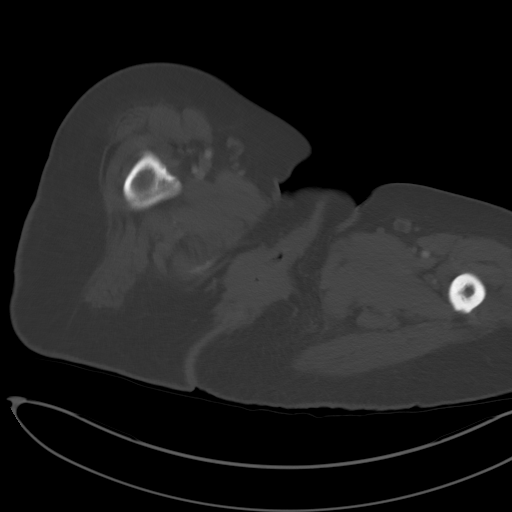
[im 16/94  soft-tissue]
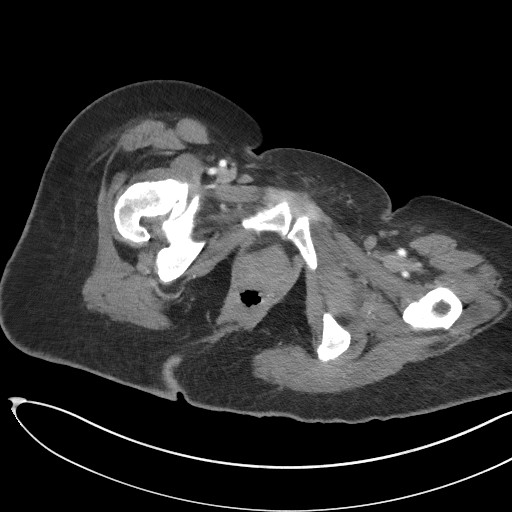
[im 24/94  soft-tissue]
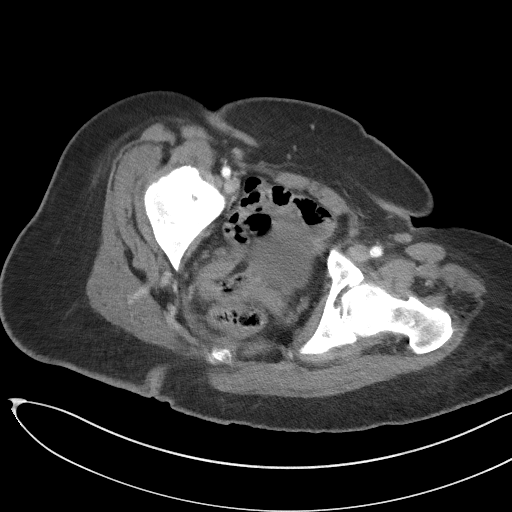
[im 32/94  soft-tissue]
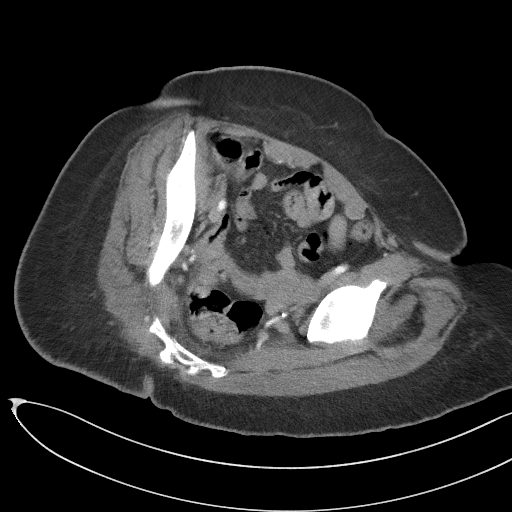
[im 39/94  soft-tissue]
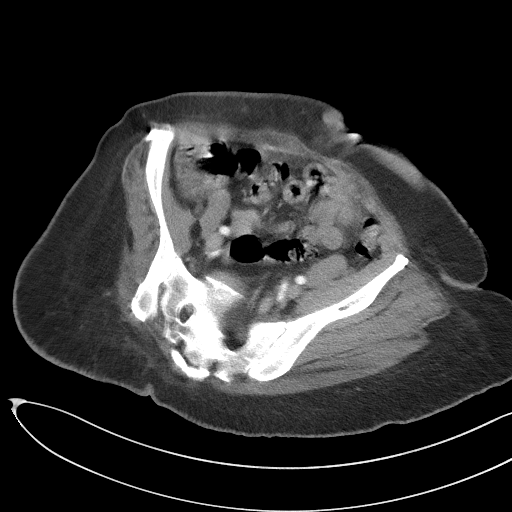
[im 47/94  soft-tissue]
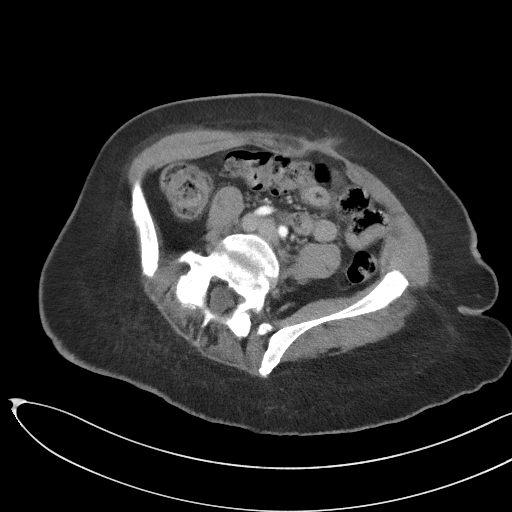
[im 55/94  soft-tissue]
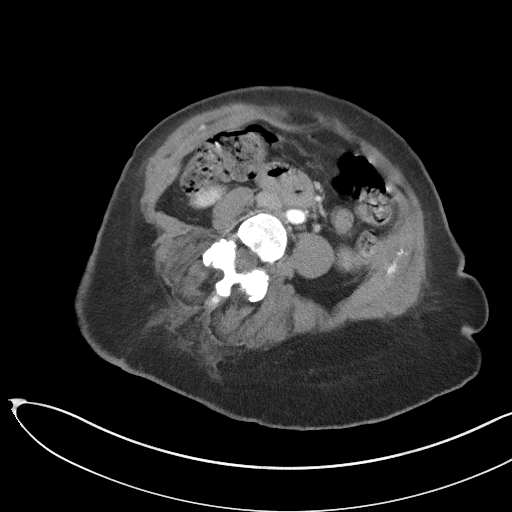
[im 63/94  soft-tissue]
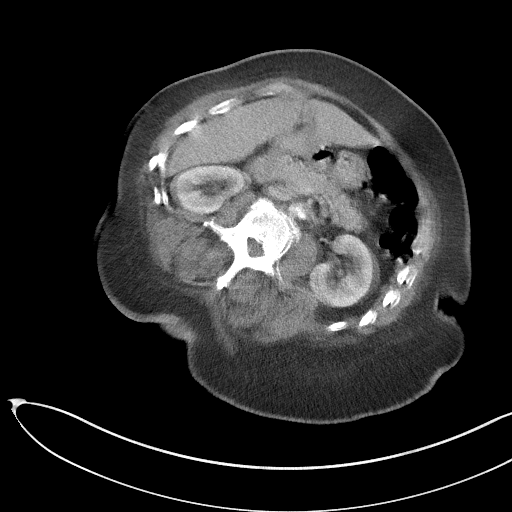
[im 70/94  soft-tissue]
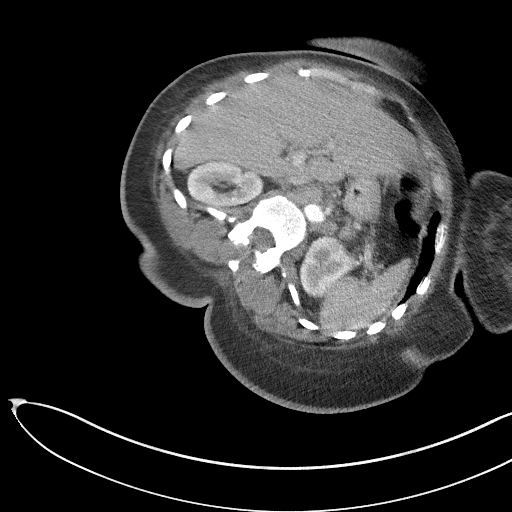
[im 70/94  bone]
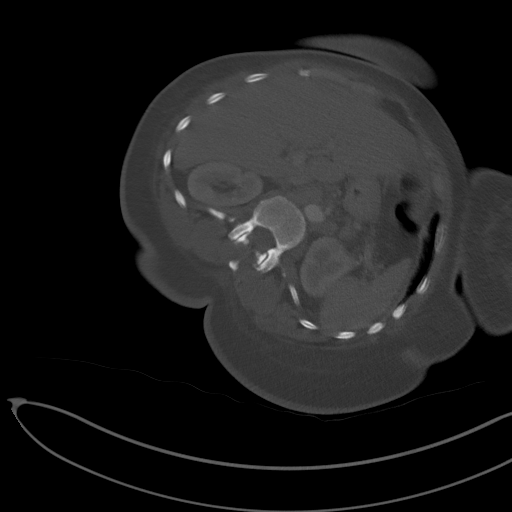
[im 78/94  soft-tissue]
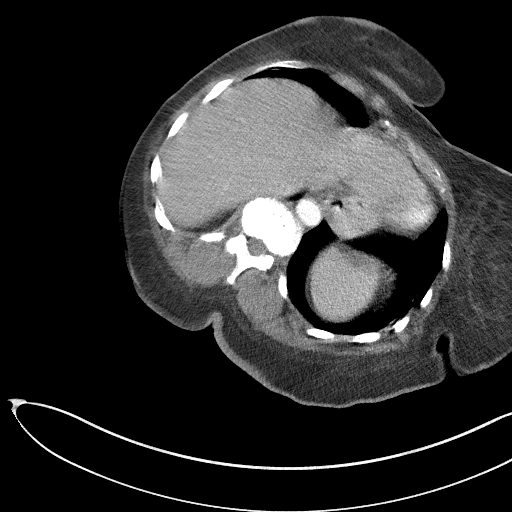
[im 86/94  soft-tissue]
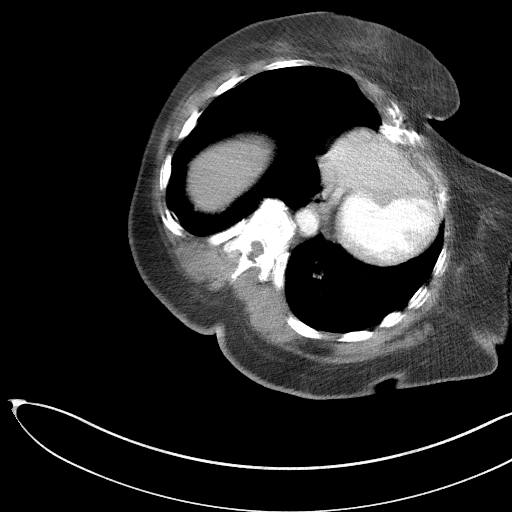

[Series 5: abd pel · coronal · 0.87mm/px · 3 of 65 slices shown]
[im 22/65  soft-tissue]
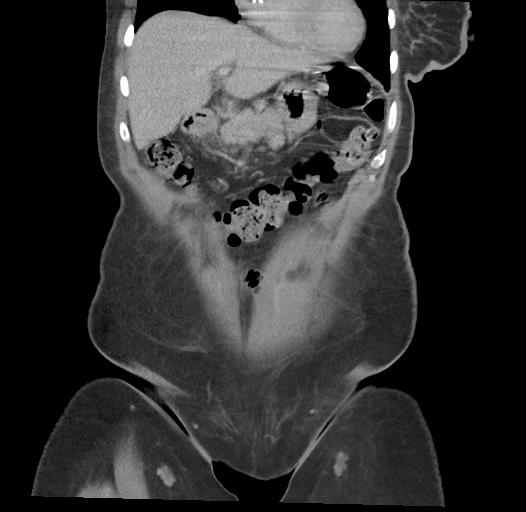
[im 29/65  soft-tissue]
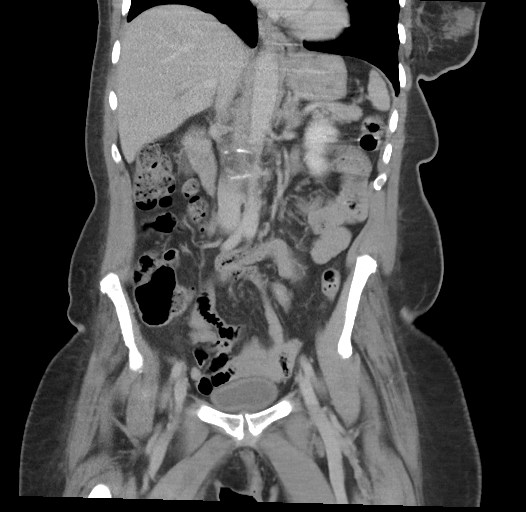
[im 36/65  soft-tissue]
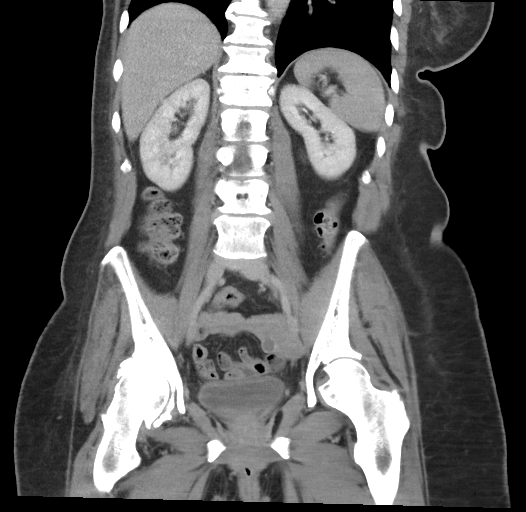

[14 of 46 positions shown; findings below may reference images not displayed]

RADIATION DOSE REDUCTION: This exam was performed according to the
departmental dose-optimization program which includes automated
exposure control, adjustment of the mA and/or kV according to
patient size and/or use of iterative reconstruction technique.

CONTRAST:  100mL OMNIPAQUE IOHEXOL 300 MG/ML  SOLN
FINDINGS: Lower chest: No acute abnormality.  Small hiatal hernia.

Hepatobiliary: No focal liver abnormality is seen. Status post
cholecystectomy. No biliary dilatation.

Pancreas: Unremarkable. No pancreatic ductal dilatation or
surrounding inflammatory changes.

Spleen: Normal in size without significant abnormality.

Adrenals/Urinary Tract: Adrenal glands are unremarkable. Kidneys are
normal, without renal calculi, solid lesion, or hydronephrosis.
Bladder is unremarkable.

Stomach/Bowel: Stomach is within normal limits. Appendix appears
normal. No evidence of bowel wall thickening, distention, or
inflammatory changes. Unchanged, incidental, benign intramural
lipoma of the distal transverse colon (series 5, image 20).

Vascular/Lymphatic: Scattered aortic atherosclerosis. Unchanged
prominent bilateral inguinal lymph nodes, measuring up to 1.3 x
cm on the left (series 3, image 81).

Reproductive: Status post interval hysterectomy and bilateral
oophorectomy.

Other: No abdominal wall hernia or abnormality. No ascites.

Musculoskeletal: No acute or significant osseous findings.
IMPRESSION: 1. Status post interval hysterectomy and bilateral oophorectomy.
2. Unchanged prominent bilateral inguinal lymph nodes, nonspecific,
most likely incidental and reactive. Nodal metastatic disease not
strictly excluded. Attention on follow-up.
3. No other evidence of metastatic disease in the abdomen or pelvis.

Aortic Atherosclerosis ([id number]).

## 2023-02-18 IMAGING — CT CT ANGIO CHEST
2 of 8 series · 18 of 46 positions shown · IV contrast (Omnipaque or Isovue)
Comparison: None Available.

CLINICAL DATA: Pulmonary embolism (PE) suspected, positive D-dimer

EXAM:
CT ANGIOGRAPHY CHEST WITH CONTRAST
TECHNIQUE: Multidetector CT imaging of the chest was performed using the
standard protocol during bolus administration of intravenous
contrast. Multiplanar CT image reconstructions and MIPs were
obtained to evaluate the vascular anatomy.

[Series 5: pe axial thins · axial · 0.75mm/px · z∈[+1323,+1549]mm · 15 of 318 slices shown]
[im 18/318  lung]
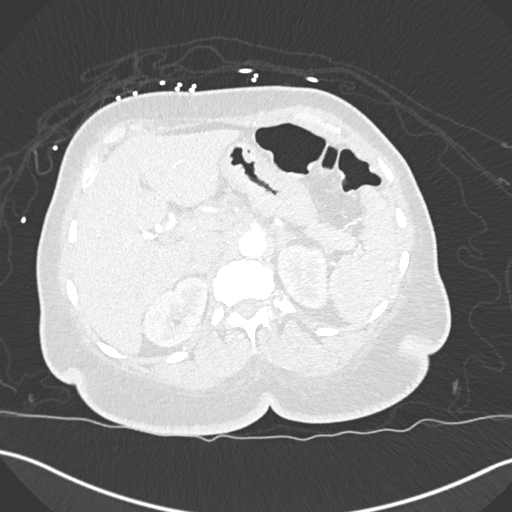
[im 36/318  soft-tissue]
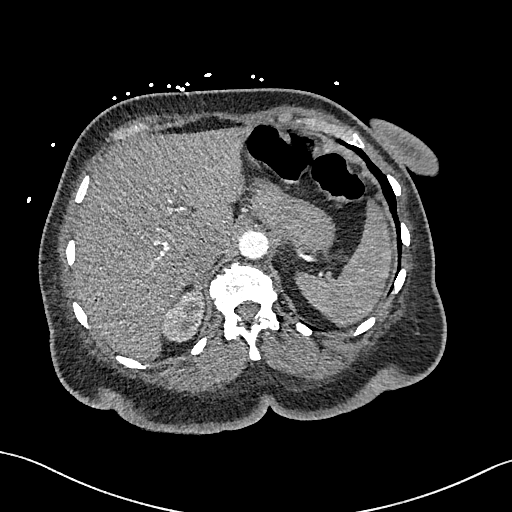
[im 53/318  lung]
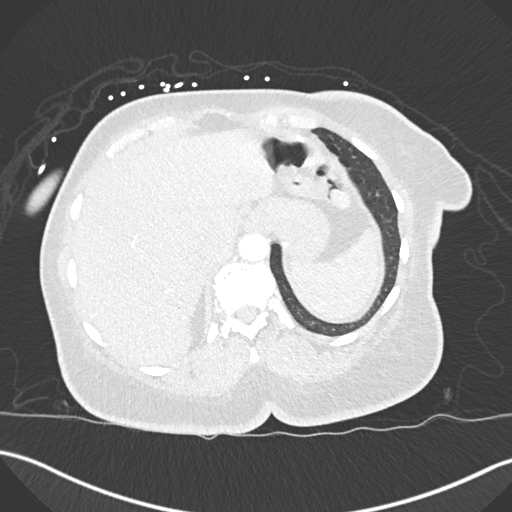
[im 71/318  soft-tissue]
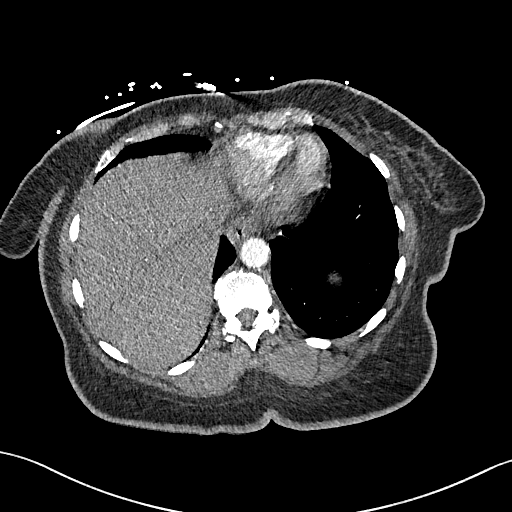
[im 106/318  lung]
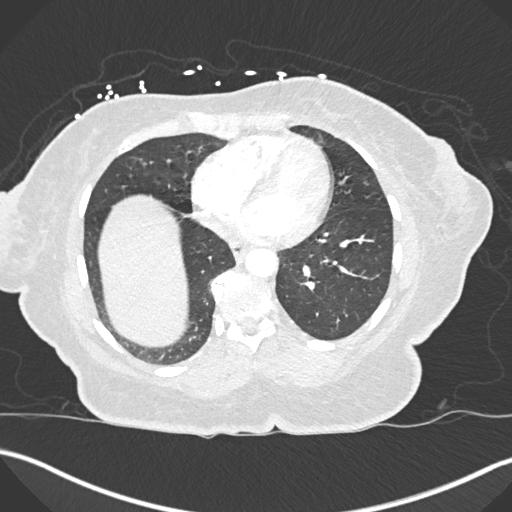
[im 124/318  soft-tissue]
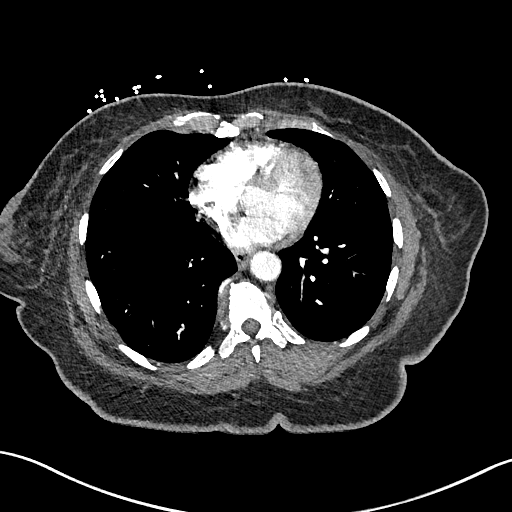
[im 141/318  lung]
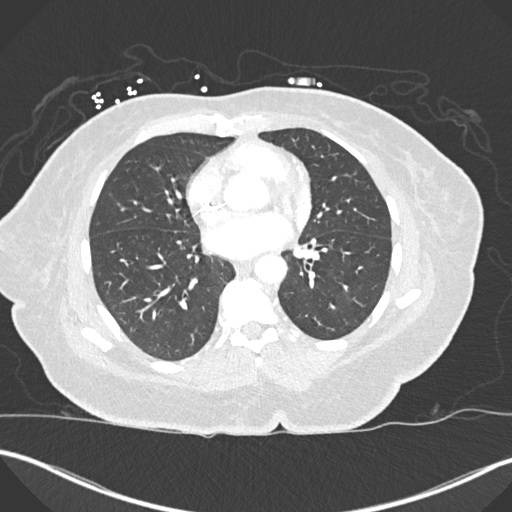
[im 159/318  soft-tissue]
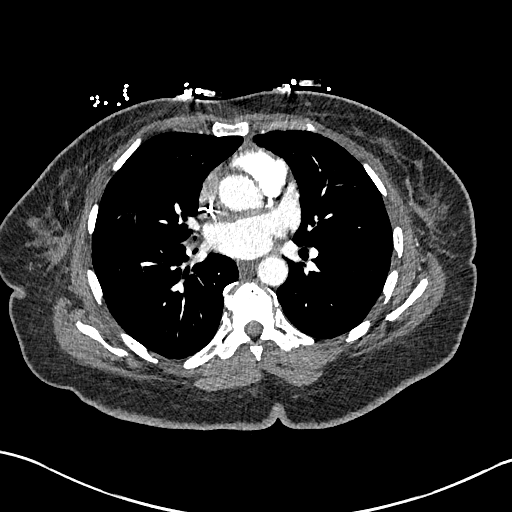
[im 177/318  lung]
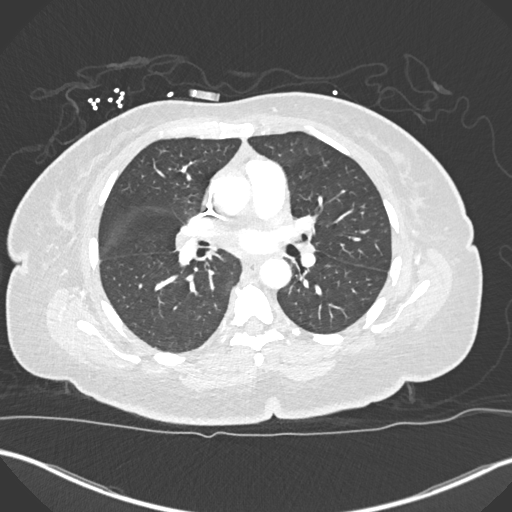
[im 194/318  soft-tissue]
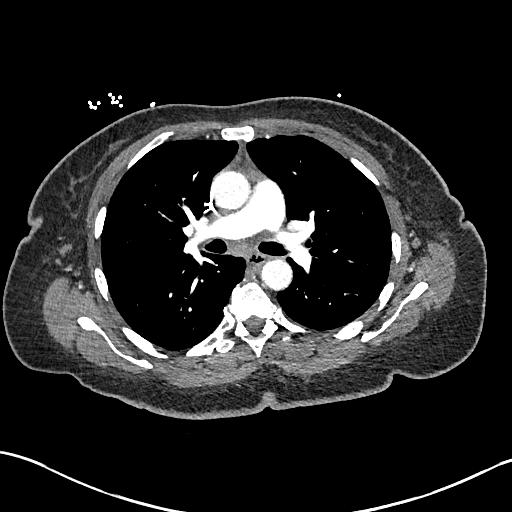
[im 212/318  lung]
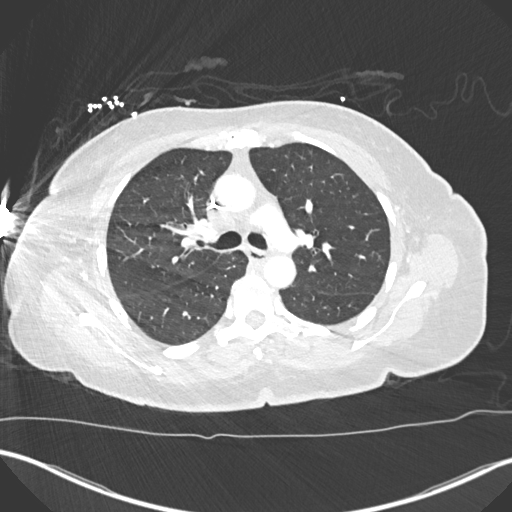
[im 247/318  soft-tissue]
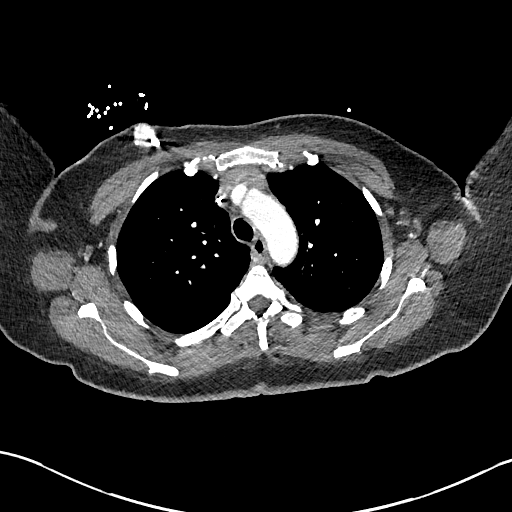
[im 265/318  lung]
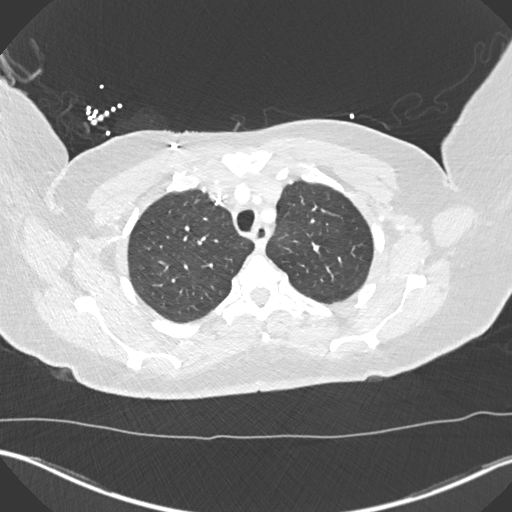
[im 282/318  soft-tissue]
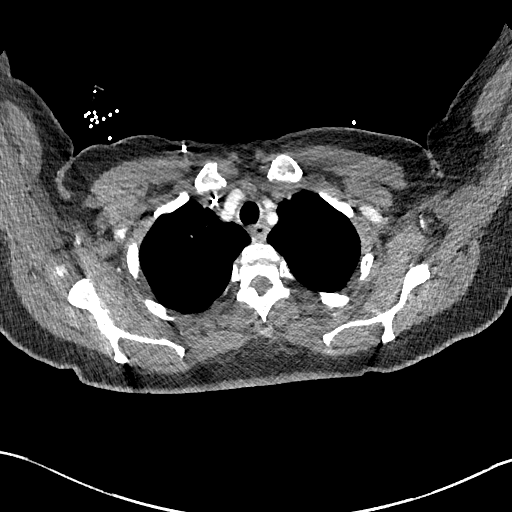
[im 300/318  lung]
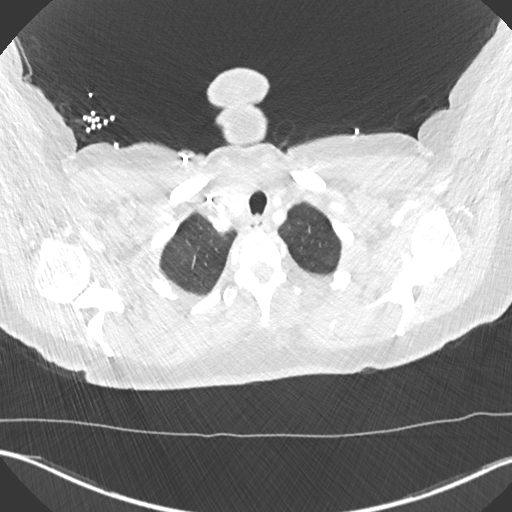

[Series 7: cor soft · coronal · 0.51mm/px · 3 of 151 slices shown]
[im 38/151  soft-tissue]
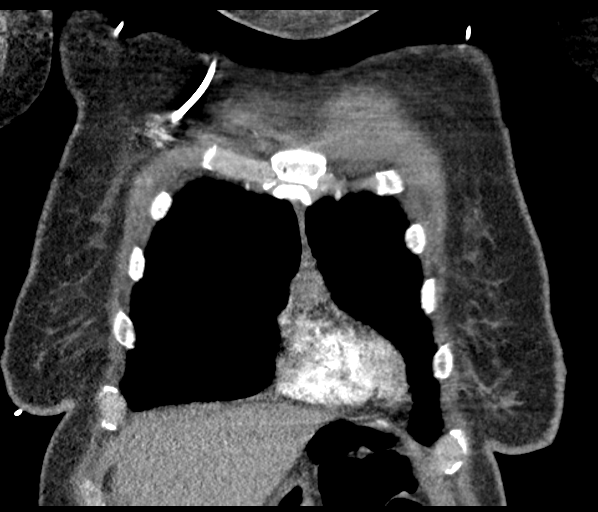
[im 76/151  soft-tissue]
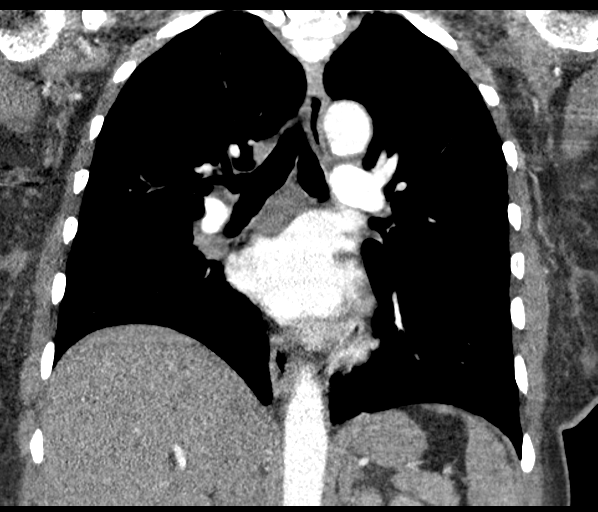
[im 113/151  soft-tissue]
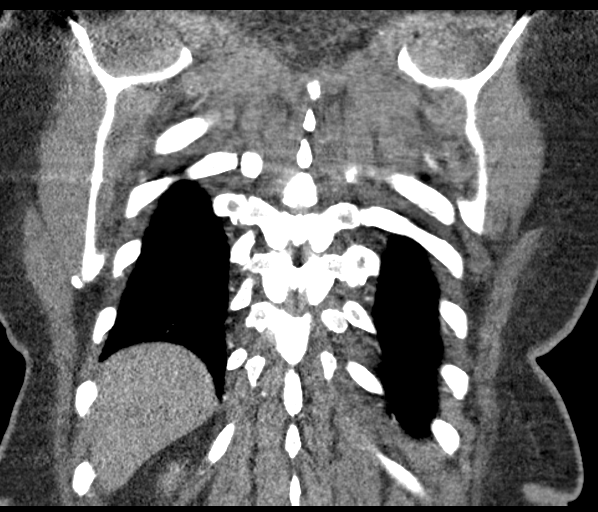

[18 of 46 positions shown; findings below may reference images not displayed]

RADIATION DOSE REDUCTION: This exam was performed according to the
departmental dose-optimization program which includes automated
exposure control, adjustment of the mA and/or kV according to
patient size and/or use of iterative reconstruction technique.

CONTRAST:  80mL OMNIPAQUE IOHEXOL 350 MG/ML SOLN
FINDINGS: Cardiovascular: Satisfactory opacification of the pulmonary arteries
to the segmental level. No evidence of pulmonary embolism. Normal
heart size. No pericardial effusion. Thoracic aorta is normal in
caliber with mild calcified plaque. Right chest wall port catheter
tip is at the cavoatrial junction.

Mediastinum/Nodes: No enlarged nodes. Included thyroid is
unremarkable. Esophagus is unremarkable.

Lungs/Pleura: No consolidation or mass. No pleural effusion or
pneumothorax.

Upper Abdomen: No acute abnormality.

Musculoskeletal: Degenerative changes of the thoracic spine.

Review of the MIP images confirms the above findings.
IMPRESSION: No acute pulmonary embolism or other acute abnormality.
